# Patient Record
Sex: Female | Born: 1950
Health system: Southern US, Community
[De-identification: ages and names within clinical notes are randomized; demographics above are authoritative.]

## PROBLEM LIST (undated history)

## (undated) DIAGNOSIS — B958 Unspecified staphylococcus as the cause of diseases classified elsewhere: Secondary | ICD-10-CM

## (undated) DIAGNOSIS — H269 Unspecified cataract: Secondary | ICD-10-CM

## (undated) DIAGNOSIS — F419 Anxiety disorder, unspecified: Secondary | ICD-10-CM

## (undated) DIAGNOSIS — K219 Gastro-esophageal reflux disease without esophagitis: Secondary | ICD-10-CM

## (undated) DIAGNOSIS — I10 Essential (primary) hypertension: Secondary | ICD-10-CM

## (undated) DIAGNOSIS — F32A Depression, unspecified: Secondary | ICD-10-CM

## (undated) DIAGNOSIS — IMO0002 Reserved for concepts with insufficient information to code with codable children: Secondary | ICD-10-CM

## (undated) DIAGNOSIS — B1920 Unspecified viral hepatitis C without hepatic coma: Secondary | ICD-10-CM

## (undated) DIAGNOSIS — M25569 Pain in unspecified knee: Secondary | ICD-10-CM

## (undated) DIAGNOSIS — F329 Major depressive disorder, single episode, unspecified: Secondary | ICD-10-CM

## (undated) DIAGNOSIS — Z780 Asymptomatic menopausal state: Secondary | ICD-10-CM

## (undated) DIAGNOSIS — J189 Pneumonia, unspecified organism: Secondary | ICD-10-CM

## (undated) DIAGNOSIS — M1990 Unspecified osteoarthritis, unspecified site: Secondary | ICD-10-CM

## (undated) DIAGNOSIS — E119 Type 2 diabetes mellitus without complications: Secondary | ICD-10-CM

## (undated) DIAGNOSIS — Z8739 Personal history of other diseases of the musculoskeletal system and connective tissue: Secondary | ICD-10-CM

## (undated) DIAGNOSIS — R519 Headache, unspecified: Secondary | ICD-10-CM

## (undated) DIAGNOSIS — R51 Headache: Secondary | ICD-10-CM

## (undated) DIAGNOSIS — G4700 Insomnia, unspecified: Secondary | ICD-10-CM

## (undated) HISTORY — DX: Unspecified cataract: H26.9

## (undated) HISTORY — PX: BREAST BIOPSY: SHX20

## (undated) HISTORY — DX: Insomnia, unspecified: G47.00

## (undated) HISTORY — PX: OVARIAN CYST REMOVAL: SHX89

## (undated) HISTORY — DX: Essential (primary) hypertension: I10

## (undated) HISTORY — DX: Personal history of other diseases of the musculoskeletal system and connective tissue: Z87.39

## (undated) HISTORY — DX: Major depressive disorder, single episode, unspecified: F32.9

## (undated) HISTORY — DX: Unspecified osteoarthritis, unspecified site: M19.90

## (undated) HISTORY — DX: Asymptomatic menopausal state: Z78.0

## (undated) HISTORY — DX: Reserved for concepts with insufficient information to code with codable children: IMO0002

## (undated) HISTORY — DX: Anxiety disorder, unspecified: F41.9

## (undated) HISTORY — DX: Depression, unspecified: F32.A

## (undated) HISTORY — PX: KNEE SURGERY: SHX244

## (undated) HISTORY — DX: Unspecified viral hepatitis C without hepatic coma: B19.20

## (undated) HISTORY — PX: JOINT REPLACEMENT: SHX530

## (undated) HISTORY — DX: Gastro-esophageal reflux disease without esophagitis: K21.9

## (undated) HISTORY — DX: Pain in unspecified knee: M25.569

## (undated) HISTORY — PX: FRACTURE SURGERY: SHX138

---

## 1981-02-22 HISTORY — PX: BUNIONECTOMY: SHX129

## 1985-02-22 HISTORY — PX: TUBAL LIGATION: SHX77

## 1988-10-23 DIAGNOSIS — IMO0002 Reserved for concepts with insufficient information to code with codable children: Secondary | ICD-10-CM

## 1988-10-23 HISTORY — DX: Reserved for concepts with insufficient information to code with codable children: IMO0002

## 1999-12-30 ENCOUNTER — Other Ambulatory Visit: Admission: RE | Admit: 1999-12-30 | Discharge: 1999-12-30 | Payer: Self-pay | Admitting: Obstetrics and Gynecology

## 2000-01-12 ENCOUNTER — Encounter: Admission: RE | Admit: 2000-01-12 | Discharge: 2000-01-12 | Payer: Self-pay | Admitting: General Surgery

## 2000-01-12 ENCOUNTER — Encounter: Payer: Self-pay | Admitting: General Surgery

## 2000-06-29 ENCOUNTER — Encounter: Admission: RE | Admit: 2000-06-29 | Discharge: 2000-06-29 | Payer: Self-pay | Admitting: General Surgery

## 2000-06-29 ENCOUNTER — Encounter: Payer: Self-pay | Admitting: General Surgery

## 2000-12-22 ENCOUNTER — Encounter: Admission: RE | Admit: 2000-12-22 | Discharge: 2000-12-22 | Payer: Self-pay | Admitting: General Surgery

## 2000-12-22 ENCOUNTER — Encounter: Payer: Self-pay | Admitting: General Surgery

## 2001-08-22 ENCOUNTER — Other Ambulatory Visit: Admission: RE | Admit: 2001-08-22 | Discharge: 2001-08-22 | Payer: Self-pay | Admitting: Obstetrics and Gynecology

## 2002-09-11 ENCOUNTER — Other Ambulatory Visit: Admission: RE | Admit: 2002-09-11 | Discharge: 2002-09-11 | Payer: Self-pay | Admitting: *Deleted

## 2003-03-05 ENCOUNTER — Encounter: Admission: RE | Admit: 2003-03-05 | Discharge: 2003-03-05 | Payer: Self-pay | Admitting: *Deleted

## 2007-03-17 ENCOUNTER — Emergency Department (HOSPITAL_COMMUNITY): Admission: EM | Admit: 2007-03-17 | Discharge: 2007-03-17 | Payer: Self-pay | Admitting: Emergency Medicine

## 2008-02-23 DIAGNOSIS — Z780 Asymptomatic menopausal state: Secondary | ICD-10-CM

## 2008-02-23 HISTORY — PX: EYE SURGERY: SHX253

## 2008-02-23 HISTORY — DX: Asymptomatic menopausal state: Z78.0

## 2009-04-15 ENCOUNTER — Encounter: Admission: RE | Admit: 2009-04-15 | Discharge: 2009-04-15 | Payer: Self-pay | Admitting: Internal Medicine

## 2010-06-16 ENCOUNTER — Other Ambulatory Visit: Payer: Self-pay | Admitting: Internal Medicine

## 2010-06-16 DIAGNOSIS — Z1231 Encounter for screening mammogram for malignant neoplasm of breast: Secondary | ICD-10-CM

## 2010-06-24 ENCOUNTER — Ambulatory Visit
Admission: RE | Admit: 2010-06-24 | Discharge: 2010-06-24 | Disposition: A | Discharge status: Home / Self Care | Payer: PRIVATE HEALTH INSURANCE | Source: Ambulatory Visit | Attending: Internal Medicine | Admitting: Internal Medicine

## 2010-06-24 DIAGNOSIS — Z1231 Encounter for screening mammogram for malignant neoplasm of breast: Secondary | ICD-10-CM

## 2010-09-25 ENCOUNTER — Ambulatory Visit (INDEPENDENT_AMBULATORY_CARE_PROVIDER_SITE_OTHER): Payer: PRIVATE HEALTH INSURANCE | Admitting: Ophthalmology

## 2011-04-07 ENCOUNTER — Telehealth: Payer: Self-pay

## 2011-04-07 ENCOUNTER — Ambulatory Visit: Payer: Self-pay | Admitting: Internal Medicine

## 2011-04-07 NOTE — Telephone Encounter (Signed)
NEEDS REFILL ON EVERYTHING. THOUGHT THAT SHE HAD APPT TODAY, BUT DID NOT GET SET UP IN EPIC. DR Merla Riches SAID OK TO REFILL FOR 3 MOS AND WE MADE APPT FOR END OF MARCH REFILL: CELEXA, MELOXICAM, PRILOSEC, NORCO 10/325,HYDROCLORATHICIDE 12.5,XANAX  [facility] [address]  [phone]

## 2011-05-19 ENCOUNTER — Ambulatory Visit (INDEPENDENT_AMBULATORY_CARE_PROVIDER_SITE_OTHER): Payer: BC Managed Care – PPO | Admitting: Internal Medicine

## 2011-05-19 ENCOUNTER — Encounter: Payer: Self-pay | Admitting: Internal Medicine

## 2011-05-19 VITALS — BP 132/77 | HR 73 | Temp 97.0°F | Resp 16 | Ht 61.25 in | Wt 161.8 lb

## 2011-05-19 DIAGNOSIS — G4700 Insomnia, unspecified: Secondary | ICD-10-CM | POA: Insufficient documentation

## 2011-05-19 DIAGNOSIS — G8929 Other chronic pain: Secondary | ICD-10-CM | POA: Insufficient documentation

## 2011-05-19 DIAGNOSIS — I10 Essential (primary) hypertension: Secondary | ICD-10-CM | POA: Insufficient documentation

## 2011-05-19 DIAGNOSIS — E1159 Type 2 diabetes mellitus with other circulatory complications: Secondary | ICD-10-CM | POA: Insufficient documentation

## 2011-05-19 DIAGNOSIS — M1990 Unspecified osteoarthritis, unspecified site: Secondary | ICD-10-CM | POA: Insufficient documentation

## 2011-05-19 DIAGNOSIS — K219 Gastro-esophageal reflux disease without esophagitis: Secondary | ICD-10-CM | POA: Insufficient documentation

## 2011-05-19 DIAGNOSIS — F418 Other specified anxiety disorders: Secondary | ICD-10-CM | POA: Insufficient documentation

## 2011-05-19 DIAGNOSIS — M5010 Cervical disc disorder with radiculopathy, unspecified cervical region: Secondary | ICD-10-CM | POA: Insufficient documentation

## 2011-05-19 DIAGNOSIS — M47812 Spondylosis without myelopathy or radiculopathy, cervical region: Secondary | ICD-10-CM | POA: Insufficient documentation

## 2011-05-19 DIAGNOSIS — B1920 Unspecified viral hepatitis C without hepatic coma: Secondary | ICD-10-CM | POA: Insufficient documentation

## 2011-05-19 DIAGNOSIS — M25569 Pain in unspecified knee: Secondary | ICD-10-CM

## 2011-05-19 DIAGNOSIS — Z8669 Personal history of other diseases of the nervous system and sense organs: Secondary | ICD-10-CM | POA: Insufficient documentation

## 2011-05-19 DIAGNOSIS — G43909 Migraine, unspecified, not intractable, without status migrainosus: Secondary | ICD-10-CM | POA: Insufficient documentation

## 2011-05-19 MED ORDER — HYDROCHLOROTHIAZIDE 12.5 MG PO CAPS: 12.5000 mg | ORAL_CAPSULE | Freq: Every day | ORAL | Status: DC

## 2011-05-19 MED ORDER — OMEPRAZOLE 20 MG PO CPDR: 20.0000 mg | DELAYED_RELEASE_CAPSULE | Freq: Every day | ORAL | Status: DC

## 2011-05-19 MED ORDER — HYDROCODONE-ACETAMINOPHEN 10-325 MG PO TABS: 1.0000 | ORAL_TABLET | Freq: Four times a day (QID) | ORAL | Status: DC | PRN

## 2011-05-19 MED ORDER — ALPRAZOLAM 0.5 MG PO TABS: 0.5000 mg | ORAL_TABLET | Freq: Three times a day (TID) | ORAL | Status: DC | PRN

## 2011-05-19 MED ORDER — SUMATRIPTAN SUCCINATE 100 MG PO TABS: 100.0000 mg | ORAL_TABLET | ORAL | Status: DC | PRN

## 2011-05-19 MED ORDER — CITALOPRAM HYDROBROMIDE 40 MG PO TABS: 40.0000 mg | ORAL_TABLET | Freq: Every day | ORAL | Status: DC

## 2011-05-19 MED ORDER — MELOXICAM 15 MG PO TABS: 15.0000 mg | ORAL_TABLET | Freq: Every day | ORAL | Status: DC

## 2011-05-19 NOTE — Progress Notes (Signed)
  Subjective:    Patient ID: Madison Collins, female    DOB: 24-Aug-1950, 61 y.o.   MRN: [id number]  HPI Patient Active Problem List  Diagnoses  . Hepatitis C  . Hypertension  . History of migraine headaches  . Osteoarthritis  . Insomnia  . Chronic knee pain  . Depression with anxiety  . Cervical disc syndrome  . Gastroesophageal reflux  Here for followup and medication refill Doing well/has a new job/still requires daily narcotics to control musculoskeletal pain in order to remain active/depression is stable/anxiety is stable with occasional alprazolam/still very stressed having to raise2 of her daughter's 4 children Ages 15 and 13 months/her daughter is now out of jail in rehabilitation and has had a new baby/Daughter has been diagnosed as bipolar and is having trouble with medication  New job managing a Therapist, sports in [employer]  Still has great relationship with Moise Boring and is planning his 62th birthday party to include a racing event     Review of SystemsCardiovascular stable GI stable-note hepatitis C with small viral load 2009   Has less than one headache every 2 months GERD stable on meds Objective:   Physical Exam Vital signs stable except mild overweight No thyromegaly or thyroid nodules Decreased range of motion neck secondary to pain Right knee status post surgery x4 after touring the anterior cruciate ligament Left knee osteoarthritis Neurological and psychiatric stable       Assessment & Plan:   Patient Active Problem List  Diagnoses  . Hepatitis C  . Hypertension  . History of migraine headaches  . Osteoarthritis  . Insomnia  . Chronic knee pain  . Depression with anxiety  . Cervical disc syndrome  . Gastroesophageal reflux   Norco increased to 4 times a day when necessary Alprazolam increased To 3 times a day if needed Other meds simply refilled Return in 6 months and we will do lab work followup

## 2011-06-11 ENCOUNTER — Encounter: Payer: Self-pay | Admitting: *Deleted

## 2011-06-17 ENCOUNTER — Other Ambulatory Visit: Payer: Self-pay | Admitting: Internal Medicine

## 2011-08-03 ENCOUNTER — Other Ambulatory Visit: Payer: Self-pay | Admitting: Internal Medicine

## 2011-08-03 DIAGNOSIS — Z1231 Encounter for screening mammogram for malignant neoplasm of breast: Secondary | ICD-10-CM

## 2011-08-19 ENCOUNTER — Ambulatory Visit
Admission: RE | Admit: 2011-08-19 | Discharge: 2011-08-19 | Disposition: A | Discharge status: Home / Self Care | Payer: BC Managed Care – PPO | Source: Ambulatory Visit | Attending: Internal Medicine | Admitting: Internal Medicine

## 2011-08-19 DIAGNOSIS — Z1231 Encounter for screening mammogram for malignant neoplasm of breast: Secondary | ICD-10-CM

## 2011-08-24 ENCOUNTER — Other Ambulatory Visit: Payer: Self-pay | Admitting: Internal Medicine

## 2011-08-24 DIAGNOSIS — R928 Other abnormal and inconclusive findings on diagnostic imaging of breast: Secondary | ICD-10-CM

## 2011-08-30 ENCOUNTER — Ambulatory Visit
Admission: RE | Admit: 2011-08-30 | Discharge: 2011-08-30 | Disposition: A | Discharge status: Home / Self Care | Payer: BC Managed Care – PPO | Source: Ambulatory Visit | Attending: Internal Medicine | Admitting: Internal Medicine

## 2011-08-30 DIAGNOSIS — R928 Other abnormal and inconclusive findings on diagnostic imaging of breast: Secondary | ICD-10-CM

## 2011-10-11 ENCOUNTER — Telehealth: Payer: Self-pay

## 2011-10-11 NOTE — Telephone Encounter (Signed)
Per EPIC, patient received td in 02/2007 at ER visit for dog bite.  [facility] notifying patient of this.

## 2011-10-11 NOTE — Telephone Encounter (Signed)
PT NEEDS TO KNOW WHEN HER LAST T-DAP WAS. SHE WAS CUT LAST NIGHT. INFO NOT IN EPIC OR [facility], PAPER CHART NEEDS TO BE PULLED [phone]

## 2011-10-29 ENCOUNTER — Other Ambulatory Visit: Payer: Self-pay | Admitting: Internal Medicine

## 2011-11-01 ENCOUNTER — Telehealth: Payer: Self-pay

## 2011-11-01 NOTE — Telephone Encounter (Signed)
The patient called to request refill of Norco rx until her appointment with Dr. Merla Riches on 11/17/11 at 10:30am.  The patient stated she ran out of this medicine yesterday.  Please call the patient at [phone].

## 2011-11-01 NOTE — Telephone Encounter (Signed)
On 3/27, Dr. Merla Riches wrote for hydrocodone 1 PO QID prn, #120 RF x 5.  So, that should last until 9/27.  She shouldn't be out yet.  What's up?

## 2011-11-02 NOTE — Telephone Encounter (Signed)
LMOM to CB. 

## 2011-11-02 NOTE — Telephone Encounter (Signed)
Spoke with pt advised message, pt understood

## 2011-11-17 ENCOUNTER — Ambulatory Visit (INDEPENDENT_AMBULATORY_CARE_PROVIDER_SITE_OTHER): Payer: BC Managed Care – PPO | Admitting: Internal Medicine

## 2011-11-17 VITALS — BP 133/75 | HR 69 | Temp 98.0°F | Resp 16 | Ht 61.5 in | Wt 161.0 lb

## 2011-11-17 DIAGNOSIS — M5010 Cervical disc disorder with radiculopathy, unspecified cervical region: Secondary | ICD-10-CM

## 2011-11-17 DIAGNOSIS — B1920 Unspecified viral hepatitis C without hepatic coma: Secondary | ICD-10-CM

## 2011-11-17 DIAGNOSIS — G8929 Other chronic pain: Secondary | ICD-10-CM

## 2011-11-17 DIAGNOSIS — F418 Other specified anxiety disorders: Secondary | ICD-10-CM

## 2011-11-17 DIAGNOSIS — Z23 Encounter for immunization: Secondary | ICD-10-CM

## 2011-11-17 DIAGNOSIS — Z6829 Body mass index (BMI) 29.0-29.9, adult: Secondary | ICD-10-CM

## 2011-11-17 DIAGNOSIS — G4700 Insomnia, unspecified: Secondary | ICD-10-CM

## 2011-11-17 DIAGNOSIS — I10 Essential (primary) hypertension: Secondary | ICD-10-CM

## 2011-11-17 DIAGNOSIS — B182 Chronic viral hepatitis C: Secondary | ICD-10-CM

## 2011-11-17 DIAGNOSIS — M1990 Unspecified osteoarthritis, unspecified site: Secondary | ICD-10-CM

## 2011-11-17 DIAGNOSIS — M25569 Pain in unspecified knee: Secondary | ICD-10-CM

## 2011-11-17 LAB — CBC WITH DIFFERENTIAL/PLATELET
HCT: 38.6 % (ref 36.0–46.0)
Hemoglobin: 13.2 g/dL (ref 12.0–15.0)
Lymphs Abs: 3 10*3/uL (ref 0.7–4.0)
MCH: 29.3 pg (ref 26.0–34.0)
Monocytes Absolute: 0.5 10*3/uL (ref 0.1–1.0)
Monocytes Relative: 7 % (ref 3–12)
Neutro Abs: 4.1 10*3/uL (ref 1.7–7.7)
Neutrophils Relative %: 52 % (ref 43–77)
RBC: 4.51 MIL/uL (ref 3.87–5.11)

## 2011-11-17 LAB — COMPREHENSIVE METABOLIC PANEL
Albumin: 4.3 g/dL (ref 3.5–5.2)
Alkaline Phosphatase: 72 U/L (ref 39–117)
BUN: 16 mg/dL (ref 6–23)
Calcium: 9.4 mg/dL (ref 8.4–10.5)
Creat: 0.61 mg/dL (ref 0.50–1.10)
Glucose, Bld: 103 mg/dL — ABNORMAL HIGH (ref 70–99)
Potassium: 4.3 mEq/L (ref 3.5–5.3)

## 2011-11-17 MED ORDER — ALPRAZOLAM 0.5 MG PO TABS: 0.5000 mg | ORAL_TABLET | Freq: Three times a day (TID) | ORAL | Status: DC | PRN

## 2011-11-17 MED ORDER — HYDROCODONE-ACETAMINOPHEN 10-325 MG PO TABS: 1.0000 | ORAL_TABLET | Freq: Four times a day (QID) | ORAL | Status: DC | PRN

## 2011-11-17 NOTE — Progress Notes (Signed)
Subjective:    Patient ID: Madison Collins, female    DOB: 02-Aug-1950, 61 y.o.   MRN: [id number]  HPI here for f/u Patient Active Problem List  Diagnosis  . Hepatitis C-Remains asymptomatic/last labs 2011 had one liver function test barely elevated  . Hypertension  . History of migraine headaches-Seldom uses Imitrex  . Osteoarthritis-Hands the worst/also low back with picking up kids  . Insomnia-Stable with Xanax at bedtime/past history of bad reactions to Ambien and Restoril  . Chronic knee pain-Right knee with no anterior cruciate ligament and chronic changes/can afford surgery name//Status post 4 surgeries on this may  . Depression with anxiety-Doing very well he states all the great stress from work  . Cervical disc syndrome  . Gastroesophageal reflux    Recent abnormal mammogram followed by normal [place].  Trying to manage 3 grandchildren (7yo, 2yo, and daughter is currently pregnant), taking care of her mom (lung Ca. dx ago),  And unhappy with boss at work.  She does not have full custody of grandchildren which makes rasing them difficult/Her daughter, their mother hooked on drugs in a lot of legal trouble.  She mentions concerns about insurance coverage during the next year before she becomes eligible for disability.   She is having trouble managing her stressors.  Has been able to sleep well using her Xanax, melatonin and reading.   She mentions she would like to have her blood work checked to follow up on her Hepatitis C and blood sugars while here today.   She also mentions that she is interested in following up with her orthopedist in the future regarding her MSK problems (R knee, low back, and C-spine)  however he is now retired.    Social: Works as a Production designer, theatre/television/film at [employer]. Lives at home with mom and 3 young grandchildren.Very good relationship In 2 years will be on ex-husb disability/social security  PMHX: Cataracts - progressing as expected Zostavax  October 2012 Hepatitis B and a completed at [facility] at time of hepatitis C diagnosis Pneumovax September 2008 Gets flu vaccine annually Last tetanus 2009 in the emergency room 2011 within normal limits She uses fecal occult blood testing rather than colonoscopy  Sx: Recent eye surgery? (pt states she needs another cataracts sx)  Review of Systems With runny nose/cough/last week/others at home sick Otherwise stable    Objective:   Physical Exam Vital signs normal Pupils equal round reactive to light and accommodation ENT clear Except mild congestion No thyromegaly Heart regular without murmur Lungs clear       Assessment & Plan:  Problem #1 hepatitis C Problem #2 osteoarthritis with chronic pain neck back and hands and right knee  -Advised pt about services offered by [employer] Re: legal custody, and job opportunities through [employer] or [employer] that might offer insurance coverage for their employees. -Check liver studies Meds ordered this encounter  Medications  . calcium carbonate 200 MG capsule    Sig: Take 250 mg by mouth 2 (two) times daily with a meal.  . ALPRAZolam (XANAX) 0.5 MG tablet    Sig: Take 1 tablet (0.5 mg total) by mouth 3 (three) times daily as needed.    Dispense:  90 tablet    Refill:  5  . HYDROcodone-acetaminophen (NORCO) 10-325 MG per tablet    Sig: Take 1 tablet by mouth every 6 (six) hours as needed.    Dispense:  120 tablet    Refill:  5   Followup 6-12 months/may call for refills in 6 months if stable -CBC w/ diff -Discussed alternative medicine options for managing her joint pain and referred her to [email or web address] --Discussed the possibility of new referral for msk problems in the future. -Refilled Alprazolam -Refilled Norco Hemoccults at home Flu vaccine

## 2011-11-18 ENCOUNTER — Encounter: Payer: Self-pay | Admitting: Internal Medicine

## 2011-11-18 DIAGNOSIS — Z6829 Body mass index (BMI) 29.0-29.9, adult: Secondary | ICD-10-CM | POA: Insufficient documentation

## 2011-12-23 ENCOUNTER — Other Ambulatory Visit: Payer: Self-pay | Admitting: Physician Assistant

## 2012-03-13 ENCOUNTER — Other Ambulatory Visit: Payer: Self-pay | Admitting: Internal Medicine

## 2012-03-13 DIAGNOSIS — N63 Unspecified lump in unspecified breast: Secondary | ICD-10-CM

## 2012-03-23 ENCOUNTER — Ambulatory Visit
Admission: RE | Admit: 2012-03-23 | Discharge: 2012-03-23 | Disposition: A | Discharge status: Home / Self Care | Payer: BC Managed Care – PPO | Source: Ambulatory Visit | Attending: Internal Medicine | Admitting: Internal Medicine

## 2012-03-23 ENCOUNTER — Other Ambulatory Visit: Payer: BC Managed Care – PPO

## 2012-03-23 DIAGNOSIS — N63 Unspecified lump in unspecified breast: Secondary | ICD-10-CM

## 2012-04-25 ENCOUNTER — Other Ambulatory Visit: Payer: Self-pay | Admitting: Physician Assistant

## 2012-05-01 ENCOUNTER — Other Ambulatory Visit: Payer: Self-pay | Admitting: Internal Medicine

## 2012-05-01 NOTE — Telephone Encounter (Signed)
Meds ordered this encounter  Medications  . HYDROcodone-acetaminophen (NORCO) 10-325 MG per tablet    Sig: TAKE 1 TABLET BY MOUTH EVERY 6 HOURS AS NEEDED FOR PAIN    Dispense:  120 tablet    Refill:  5

## 2012-05-01 NOTE — Telephone Encounter (Signed)
Forward to Dr. Doolittle 

## 2012-05-03 ENCOUNTER — Ambulatory Visit: Payer: BC Managed Care – PPO | Admitting: Internal Medicine

## 2012-05-17 ENCOUNTER — Encounter: Payer: Self-pay | Admitting: Internal Medicine

## 2012-05-17 ENCOUNTER — Ambulatory Visit (INDEPENDENT_AMBULATORY_CARE_PROVIDER_SITE_OTHER): Payer: BC Managed Care – PPO | Admitting: Internal Medicine

## 2012-05-17 VITALS — BP 130/68 | HR 62 | Temp 97.8°F | Resp 16 | Ht 61.0 in | Wt 167.4 lb

## 2012-05-17 DIAGNOSIS — M1990 Unspecified osteoarthritis, unspecified site: Secondary | ICD-10-CM

## 2012-05-17 DIAGNOSIS — M5000 Cervical disc disorder with myelopathy, unspecified cervical region: Secondary | ICD-10-CM

## 2012-05-17 DIAGNOSIS — I10 Essential (primary) hypertension: Secondary | ICD-10-CM

## 2012-05-17 DIAGNOSIS — F418 Other specified anxiety disorders: Secondary | ICD-10-CM

## 2012-05-17 DIAGNOSIS — Z6829 Body mass index (BMI) 29.0-29.9, adult: Secondary | ICD-10-CM

## 2012-05-17 DIAGNOSIS — G4700 Insomnia, unspecified: Secondary | ICD-10-CM

## 2012-05-17 DIAGNOSIS — F341 Dysthymic disorder: Secondary | ICD-10-CM

## 2012-05-17 DIAGNOSIS — M25569 Pain in unspecified knee: Secondary | ICD-10-CM

## 2012-05-17 DIAGNOSIS — M5010 Cervical disc disorder with radiculopathy, unspecified cervical region: Secondary | ICD-10-CM

## 2012-05-17 DIAGNOSIS — Z8669 Personal history of other diseases of the nervous system and sense organs: Secondary | ICD-10-CM

## 2012-05-17 DIAGNOSIS — K219 Gastro-esophageal reflux disease without esophagitis: Secondary | ICD-10-CM

## 2012-05-17 DIAGNOSIS — G8929 Other chronic pain: Secondary | ICD-10-CM

## 2012-05-17 DIAGNOSIS — B1920 Unspecified viral hepatitis C without hepatic coma: Secondary | ICD-10-CM

## 2012-05-17 MED ORDER — ONDANSETRON HCL 4 MG PO TABS: 4.0000 mg | ORAL_TABLET | Freq: Three times a day (TID) | ORAL | Status: DC | PRN

## 2012-05-17 MED ORDER — SUMATRIPTAN SUCCINATE 100 MG PO TABS: 100.0000 mg | ORAL_TABLET | ORAL | Status: DC | PRN

## 2012-05-17 MED ORDER — ALPRAZOLAM 0.5 MG PO TABS: 0.5000 mg | ORAL_TABLET | Freq: Three times a day (TID) | ORAL | Status: DC | PRN

## 2012-05-17 MED ORDER — HYDROCODONE-ACETAMINOPHEN 10-325 MG PO TABS: ORAL_TABLET | ORAL | Status: DC

## 2012-05-17 MED ORDER — MELOXICAM 15 MG PO TABS: 15.0000 mg | ORAL_TABLET | Freq: Every day | ORAL | Status: DC

## 2012-05-17 NOTE — Progress Notes (Signed)
  Subjective:    Patient ID: Madison Collins, female    DOB: 1950/03/15, 62 y.o.   MRN: [id number]  HPI  Patient Active Problem List  Diagnosis  . Hepatitis C--- asymptomatic /liver function studies 3 months ago within normal limits   . Hypertension--- home blood pressure stable   . History of migraine headaches--frequent headaches but not classic migraines has in the past /has tried tryptophans with no results except when headaches are like her previous migraines /lots of stress associated headaches at this point   . Osteoarthritis--without meloxicam she hurts in several joints enough to interfere with activity   . Insomnia--response to Xanax   . Chronic knee pain-when necessary hydrocodone helpful   . Depression with anxiety---4+ stress-daughter and Mom both at home and in arguments const//also work stress//is trying to use behavioral techniques to improve her anxiety as well this has plans to start exercise program //continues on Celexa   . Cervical disc syndrome-when necessary hydrocodone helpful   . Gastroesophageal reflux-stable on omeprazole   . BMI 29.0-29.9,adult  Work is extremely stressful/hours and pay reduced//looking for new job Good relationship-Donnie Harris    Review of Systems Viral gastroenteritis this week now resolving No other particular symptoms No weight loss or night sweats    Objective:   Physical Exam BP 130/68  Pulse 62  Temp(Src) 97.8 F (36.6 C) (Oral)  Resp 16  Ht 5\' 1"  (1.549 m)  Wt 167 lb 6.4 oz (75.932 kg)  BMI 31.65 kg/m2  SpO2 98% HEENT clear Heart regular Neurological intact Mood and affect appropriate/good       Assessment & Plan:   Patient Active Problem List  Diagnosis  . Hepatitis C  . Hypertension  . History of migraine headaches  . Osteoarthritis  . Insomnia  . Chronic knee pain  . Depression with anxiety  . Cervical disc syndrome  . Gastroesophageal reflux  . BMI 29.0-29.9,adult    -  borderline cholesterol-needs  weight loss to avoid medications  Meds ordered this encounter  Medications  . ALPRAZolam (XANAX) 0.5 MG tablet    Sig: Take 1 tablet (0.5 mg total) by mouth 3 (three) times daily as needed.    Dispense:  90 tablet    Refill:  5  . HYDROcodone-acetaminophen (NORCO) 10-325 MG per tablet    Sig: TAKE 1 TABLET BY MOUTH EVERY 6 HOURS AS NEEDED FOR PAIN    Dispense:  120 tablet    Refill:  5  . meloxicam (MOBIC) 15 MG tablet    Sig: Take 1 tablet (15 mg total) by mouth daily.    Dispense:  90 tablet    Refill:  3  . SUMAtriptan (IMITREX) 100 MG tablet    Sig: Take 1 tablet (100 mg total) by mouth every 2 (two) hours as needed.    Dispense:  10 tablet    Refill:  5  . ondansetron (ZOFRAN) 4 MG tablet    Sig: Take 1 tablet (4 mg total) by mouth every 8 (eight) hours as needed for nausea.    Dispense:  12 tablet    Refill:  0   Other medication refills are good through September when she will followup for lab and exams

## 2012-05-25 ENCOUNTER — Other Ambulatory Visit: Payer: Self-pay | Admitting: Internal Medicine

## 2012-11-07 ENCOUNTER — Other Ambulatory Visit: Payer: Self-pay

## 2012-11-07 DIAGNOSIS — Z1231 Encounter for screening mammogram for malignant neoplasm of breast: Secondary | ICD-10-CM

## 2012-11-08 ENCOUNTER — Ambulatory Visit (INDEPENDENT_AMBULATORY_CARE_PROVIDER_SITE_OTHER): Payer: BC Managed Care – PPO | Admitting: Internal Medicine

## 2012-11-08 VITALS — BP 136/74 | HR 74 | Temp 98.2°F | Resp 16 | Ht 62.0 in | Wt 170.0 lb

## 2012-11-08 DIAGNOSIS — M1990 Unspecified osteoarthritis, unspecified site: Secondary | ICD-10-CM

## 2012-11-08 DIAGNOSIS — F418 Other specified anxiety disorders: Secondary | ICD-10-CM

## 2012-11-08 DIAGNOSIS — L309 Dermatitis, unspecified: Secondary | ICD-10-CM | POA: Insufficient documentation

## 2012-11-08 DIAGNOSIS — B1920 Unspecified viral hepatitis C without hepatic coma: Secondary | ICD-10-CM

## 2012-11-08 DIAGNOSIS — M25579 Pain in unspecified ankle and joints of unspecified foot: Secondary | ICD-10-CM

## 2012-11-08 DIAGNOSIS — K219 Gastro-esophageal reflux disease without esophagitis: Secondary | ICD-10-CM

## 2012-11-08 DIAGNOSIS — M25571 Pain in right ankle and joints of right foot: Secondary | ICD-10-CM

## 2012-11-08 DIAGNOSIS — Z23 Encounter for immunization: Secondary | ICD-10-CM

## 2012-11-08 DIAGNOSIS — I10 Essential (primary) hypertension: Secondary | ICD-10-CM

## 2012-11-08 DIAGNOSIS — M5010 Cervical disc disorder with radiculopathy, unspecified cervical region: Secondary | ICD-10-CM

## 2012-11-08 DIAGNOSIS — G8929 Other chronic pain: Secondary | ICD-10-CM

## 2012-11-08 DIAGNOSIS — F341 Dysthymic disorder: Secondary | ICD-10-CM

## 2012-11-08 DIAGNOSIS — Z8669 Personal history of other diseases of the nervous system and sense organs: Secondary | ICD-10-CM

## 2012-11-08 DIAGNOSIS — Z6829 Body mass index (BMI) 29.0-29.9, adult: Secondary | ICD-10-CM

## 2012-11-08 LAB — CBC
HCT: 39.7 % (ref 36.0–46.0)
MCV: 85.7 fL (ref 78.0–100.0)
RBC: 4.63 MIL/uL (ref 3.87–5.11)
WBC: 8 10*3/uL (ref 4.0–10.5)

## 2012-11-08 MED ORDER — FLUOCINONIDE-E 0.05 % EX CREA: TOPICAL_CREAM | Freq: Two times a day (BID) | CUTANEOUS | Status: DC

## 2012-11-08 MED ORDER — ALPRAZOLAM 0.5 MG PO TABS: 0.5000 mg | ORAL_TABLET | Freq: Three times a day (TID) | ORAL | Status: DC | PRN

## 2012-11-08 MED ORDER — HYDROCHLOROTHIAZIDE 12.5 MG PO CAPS: ORAL_CAPSULE | ORAL | Status: DC

## 2012-11-08 MED ORDER — CITALOPRAM HYDROBROMIDE 40 MG PO TABS: ORAL_TABLET | ORAL | Status: DC

## 2012-11-08 MED ORDER — HYDROCODONE-ACETAMINOPHEN 10-325 MG PO TABS: ORAL_TABLET | ORAL | Status: DC

## 2012-11-08 MED ORDER — OMEPRAZOLE 20 MG PO CPDR: DELAYED_RELEASE_CAPSULE | ORAL | Status: DC

## 2012-11-08 NOTE — Progress Notes (Signed)
Subjective:    Patient ID: Madison Collins, female    DOB: 1950/08/24, 62 y.o.   MRN: [id number]  HPI here for f/u Patient Active Problem List   Diagnosis Date Noted  . Foot dermatitis 11/08/2012  . BMI 29.0-29.9,adult 11/18/2011  . Hepatitis C 05/19/2011  . Hypertension 05/19/2011  . History of migraine headaches 05/19/2011  . Osteoarthritis 05/19/2011  . Insomnia 05/19/2011  . Chronic knee pain 05/19/2011  . Depression with anxiety 05/19/2011  . Cervical disc syndrome 05/19/2011  . Gastroesophageal reflux 05/19/2011     Right ankle pain for 2 weeks, no injury. Around top of ankle. Thinks this could be arthritis since she has many other joints affected.  Mother passed away in 08-25-2022. She was taking care of her and this was somewhat unexpected. She is doing a little better with this and is starting to take care of herself better and eat better. She still takes care of her 3 grandchildren which keeps her busy. Stress level different since her mother died. Daughter at least has a job at this point and there is a plan for getting her into an independent living situation. Has to take 1-4 pain pills a day depending on how severe pain is.  Celexa working ok for managing depression/anxiety.  Right foot and ankle with dry, red rash. Heels cracked.  Migraines about 1x month.  Review of Systems  Constitutional: Negative for activity change, appetite change, fatigue and unexpected weight change.  Respiratory: Negative for cough, chest tightness and shortness of breath.   Cardiovascular: Negative for chest pain.  Gastrointestinal: Negative for abdominal pain and abdominal distention.   Scheduled follow up mammo which is overdue. Is considering colonoscopy once her mother's estate is settled. Had flu shot today Does not remember when last pap was done    Objective:   Physical Exam  Constitutional: She is oriented to person, place, and time. She appears well-developed and  well-nourished.  Neck:  Painful rom   Cardiovascular: Normal rate, regular rhythm and normal heart sounds.   Pulmonary/Chest: Effort normal and breath sounds normal.  Musculoskeletal: Normal range of motion.  Pain with rotation. DP/PT pulses present. Color normal. ROM normal.  Neurological: She is alert and oriented to person, place, and time.  Skin: Skin is warm and dry.  Psychiatric: She has a normal mood and affect. Her behavior is normal. Judgment and thought content normal.  Right foot with erythema and scattered macules on heel and top of foot. Area between toes clear.   and ankle with good range of motion without laxity Mild tenderness dorsally at the syndesmosis  Assessment & Plan:  Need for prophylactic vaccination and inoculation against influenza - Plan: Flu Vaccine QUAD 36+ mos IM  Osteoarthritis/chronic pain knee and neck  Hypertension  Depression with anxiety--stable/doing well  Pain in joint, ankle and foot, right--- unclear etiology recheck in 4 weeks if not well  1- Flu shot today 2- Osteoarthritis- continue current meds 3-depression with anxiety- continue Celexa 4- pain in right ankle- elevate, heat/ice for comfort. Wear supportive shoe. F/U with xray if no improvement. 5-Dermatitis of foot 6- 6 month follow up with CPE   7- colonoscopy pending  Meds ordered this encounter  Medications  . fluocinonide-emollient (LIDEX-E) 0.05 % cream    Sig: Apply topically 2 (two) times daily. Foot dermatitis    Dispense:  30 g    Refill:  3  . hydrochlorothiazide (MICROZIDE) 12.5 MG capsule    Sig: TAKE 1 CAPSULE BY  MOUTH DAILY    Dispense:  90 capsule    Refill:  1  . citalopram (CELEXA) 40 MG tablet    Sig: TAKE 1 TABLET BY MOUTH DAILY    Dispense:  90 tablet    Refill:  1  . omeprazole (PRILOSEC) 20 MG capsule    Sig: TAKE 1 CAPSULE BY MOUTH DAILY    Dispense:  90 capsule    Refill:  1  . HYDROcodone-acetaminophen (NORCO) 10-325 MG per tablet    Sig: TAKE 1  TABLET BY MOUTH EVERY 6 HOURS AS NEEDED FOR PAIN    Dispense:  120 tablet    Refill:  5  . ALPRAZolam (XANAX) 0.5 MG tablet    Sig: Take 1 tablet (0.5 mg total) by mouth 3 (three) times daily as needed.    Dispense:  90 tablet    Refill:  5

## 2012-11-09 ENCOUNTER — Encounter: Payer: Self-pay | Admitting: Internal Medicine

## 2012-11-09 LAB — COMPREHENSIVE METABOLIC PANEL
BUN: 16 mg/dL (ref 6–23)
CO2: 24 mEq/L (ref 19–32)
Calcium: 9.2 mg/dL (ref 8.4–10.5)
Chloride: 102 mEq/L (ref 96–112)
Creat: 0.61 mg/dL (ref 0.50–1.10)
Glucose, Bld: 87 mg/dL (ref 70–99)

## 2012-11-28 ENCOUNTER — Other Ambulatory Visit: Payer: Self-pay | Admitting: Internal Medicine

## 2012-11-29 ENCOUNTER — Ambulatory Visit: Payer: BC Managed Care – PPO

## 2012-12-11 ENCOUNTER — Telehealth: Payer: Self-pay

## 2012-12-11 DIAGNOSIS — M25571 Pain in right ankle and joints of right foot: Secondary | ICD-10-CM

## 2012-12-11 MED ORDER — HYDROCODONE-ACETAMINOPHEN 10-325 MG PO TABS: ORAL_TABLET | ORAL | Status: DC

## 2012-12-11 NOTE — Telephone Encounter (Signed)
Pt came into 102 to check on Rx. I am printing off Rx to have Dr Merla Riches sign since he is in the office and had written RFs for pt.  Dr Merla Riches signed Rx and it was given to pt.

## 2012-12-11 NOTE — Telephone Encounter (Signed)
Patient would like a refill on her norco 10-325 please call patient with questions at [phone]

## 2012-12-11 NOTE — Telephone Encounter (Signed)
Patient call again.  She is travelling and hits dead zones on her phone.  Please try again.  [phone]

## 2012-12-11 NOTE — Telephone Encounter (Addendum)
Patient had refills, but they have been cancelled with new classification of this medication, pended please print sign and I will call pt.

## 2012-12-20 ENCOUNTER — Ambulatory Visit
Admission: RE | Admit: 2012-12-20 | Discharge: 2012-12-20 | Disposition: A | Discharge status: Home / Self Care | Payer: BC Managed Care – PPO | Source: Ambulatory Visit

## 2012-12-20 DIAGNOSIS — Z1231 Encounter for screening mammogram for malignant neoplasm of breast: Secondary | ICD-10-CM

## 2013-01-06 ENCOUNTER — Telehealth: Payer: Self-pay | Admitting: Internal Medicine

## 2013-01-06 DIAGNOSIS — M25571 Pain in right ankle and joints of right foot: Secondary | ICD-10-CM

## 2013-01-06 NOTE — Telephone Encounter (Signed)
Pt called in requesting a refill of her HYDROcodone-acetaminophen (NORCO) 10-325 MG per tablet. Please call when ready for pick up [phone]

## 2013-01-08 MED ORDER — HYDROCODONE-ACETAMINOPHEN 10-325 MG PO TABS: ORAL_TABLET | ORAL | Status: DC

## 2013-01-08 NOTE — Telephone Encounter (Signed)
Meds ordered this encounter  Medications  . HYDROcodone-acetaminophen (NORCO) 10-325 MG per tablet    Sig: TAKE 1 TABLET BY MOUTH EVERY 6 HOURS AS NEEDED FOR PAIN    Dispense:  120 tablet    Refill:  0

## 2013-01-08 NOTE — Telephone Encounter (Signed)
Patient advised this is ready for pick up.  

## 2013-01-15 ENCOUNTER — Emergency Department (HOSPITAL_COMMUNITY)
Admission: EM | Admit: 2013-01-15 | Discharge: 2013-01-15 | Disposition: A | Discharge status: Home / Self Care | Payer: BC Managed Care – PPO | Attending: Emergency Medicine | Admitting: Emergency Medicine

## 2013-01-15 ENCOUNTER — Encounter (HOSPITAL_COMMUNITY): Payer: Self-pay | Admitting: Emergency Medicine

## 2013-01-15 ENCOUNTER — Ambulatory Visit (INDEPENDENT_AMBULATORY_CARE_PROVIDER_SITE_OTHER): Payer: BC Managed Care – PPO | Admitting: Family Medicine

## 2013-01-15 ENCOUNTER — Emergency Department (HOSPITAL_COMMUNITY): Payer: BC Managed Care – PPO

## 2013-01-15 VITALS — BP 135/78 | HR 82 | Temp 97.6°F | Resp 18 | Wt 111.0 lb

## 2013-01-15 DIAGNOSIS — R509 Fever, unspecified: Secondary | ICD-10-CM | POA: Insufficient documentation

## 2013-01-15 DIAGNOSIS — I10 Essential (primary) hypertension: Secondary | ICD-10-CM | POA: Insufficient documentation

## 2013-01-15 DIAGNOSIS — R8281 Pyuria: Secondary | ICD-10-CM

## 2013-01-15 DIAGNOSIS — Z8619 Personal history of other infectious and parasitic diseases: Secondary | ICD-10-CM | POA: Insufficient documentation

## 2013-01-15 DIAGNOSIS — R112 Nausea with vomiting, unspecified: Secondary | ICD-10-CM | POA: Insufficient documentation

## 2013-01-15 DIAGNOSIS — N390 Urinary tract infection, site not specified: Secondary | ICD-10-CM

## 2013-01-15 DIAGNOSIS — F341 Dysthymic disorder: Secondary | ICD-10-CM | POA: Insufficient documentation

## 2013-01-15 DIAGNOSIS — G4700 Insomnia, unspecified: Secondary | ICD-10-CM | POA: Insufficient documentation

## 2013-01-15 DIAGNOSIS — R82998 Other abnormal findings in urine: Secondary | ICD-10-CM

## 2013-01-15 DIAGNOSIS — M1990 Unspecified osteoarthritis, unspecified site: Secondary | ICD-10-CM | POA: Insufficient documentation

## 2013-01-15 DIAGNOSIS — N61 Mastitis without abscess: Secondary | ICD-10-CM | POA: Insufficient documentation

## 2013-01-15 DIAGNOSIS — Z79899 Other long term (current) drug therapy: Secondary | ICD-10-CM | POA: Insufficient documentation

## 2013-01-15 DIAGNOSIS — L02219 Cutaneous abscess of trunk, unspecified: Secondary | ICD-10-CM

## 2013-01-15 DIAGNOSIS — E860 Dehydration: Secondary | ICD-10-CM

## 2013-01-15 LAB — COMPREHENSIVE METABOLIC PANEL
ALT: 20 U/L (ref 0–35)
AST: 14 U/L (ref 0–37)
Albumin: 4.1 g/dL (ref 3.5–5.2)
Alkaline Phosphatase: 69 U/L (ref 39–117)
BUN: 13 mg/dL (ref 6–23)
CO2: 25 mEq/L (ref 19–32)
Calcium: 9.3 mg/dL (ref 8.4–10.5)
Chloride: 104 mEq/L (ref 96–112)
Creat: 0.58 mg/dL (ref 0.50–1.10)
Glucose, Bld: 100 mg/dL — ABNORMAL HIGH (ref 70–99)
Potassium: 3.8 mEq/L (ref 3.5–5.3)
Sodium: 141 mEq/L (ref 135–145)
Total Bilirubin: 0.5 mg/dL (ref 0.3–1.2)
Total Protein: 7.4 g/dL (ref 6.0–8.3)

## 2013-01-15 LAB — POCT CBC
Granulocyte percent: 70.9 %G (ref 37–80)
HCT, POC: 41.8 % (ref 37.7–47.9)
Hemoglobin: 13.1 g/dL (ref 12.2–16.2)
Lymph, poc: 2.5 (ref 0.6–3.4)
MCH, POC: 29.2 pg (ref 27–31.2)
MCHC: 31.3 g/dL — AB (ref 31.8–35.4)
MCV: 93.2 fL (ref 80–97)
MID (cbc): 0.6 (ref 0–0.9)
MPV: 9.3 fL (ref 0–99.8)
POC Granulocyte: 7.6 — AB (ref 2–6.9)
POC LYMPH PERCENT: 23.5 %L (ref 10–50)
POC MID %: 5.6 %M (ref 0–12)
Platelet Count, POC: 240 10*3/uL (ref 142–424)
RBC: 4.49 M/uL (ref 4.04–5.48)
RDW, POC: 14.4 %
WBC: 10.7 10*3/uL — AB (ref 4.6–10.2)

## 2013-01-15 LAB — POCT URINALYSIS DIPSTICK
Bilirubin, UA: NEGATIVE
Glucose, UA: NEGATIVE
Ketones, UA: NEGATIVE
Nitrite, UA: NEGATIVE
Protein, UA: NEGATIVE
Spec Grav, UA: 1.01
Urobilinogen, UA: 0.2
pH, UA: 7.5

## 2013-01-15 LAB — BASIC METABOLIC PANEL
BUN: 12 mg/dL (ref 6–23)
CO2: 21 mEq/L (ref 19–32)
Calcium: 9 mg/dL (ref 8.4–10.5)
Creatinine, Ser: 0.53 mg/dL (ref 0.50–1.10)
GFR calc Af Amer: >90 mL/min (ref >90)
GFR calc non Af Amer: >90 mL/min (ref >90)
Sodium: 140 mEq/L (ref 135–145)

## 2013-01-15 LAB — POCT UA - MICROSCOPIC ONLY
Bacteria, U Microscopic: NEGATIVE
Casts, Ur, LPF, POC: NEGATIVE
Crystals, Ur, HPF, POC: NEGATIVE
Mucus, UA: NEGATIVE
Yeast, UA: NEGATIVE

## 2013-01-15 MED ORDER — VANCOMYCIN HCL IN DEXTROSE 1-5 GM/200ML-% IV SOLN
1000.0000 mg | Freq: Once | INTRAVENOUS | Status: AC
  Administered 2013-01-15: 1000 mg via INTRAVENOUS
  Filled 2013-01-15: qty 200

## 2013-01-15 MED ORDER — SODIUM CHLORIDE 0.9 % IV BOLUS (SEPSIS)
1000.0000 mL | Freq: Once | INTRAVENOUS | Status: AC
  Administered 2013-01-15: 1000 mL via INTRAVENOUS

## 2013-01-15 MED ORDER — ONDANSETRON HCL 4 MG/2ML IJ SOLN
4.0000 mg | Freq: Once | INTRAMUSCULAR | Status: AC
  Administered 2013-01-15: 4 mg via INTRAVENOUS
  Filled 2013-01-15: qty 2

## 2013-01-15 MED ORDER — OXYCODONE-ACETAMINOPHEN 5-325 MG PO TABS: 1.0000 | ORAL_TABLET | ORAL | Status: DC | PRN

## 2013-01-15 MED ORDER — ONDANSETRON HCL 4 MG PO TABS: 4.0000 mg | ORAL_TABLET | Freq: Four times a day (QID) | ORAL | Status: DC

## 2013-01-15 MED ORDER — SULFAMETHOXAZOLE-TMP DS 800-160 MG PO TABS: 1.0000 | ORAL_TABLET | Freq: Two times a day (BID) | ORAL | Status: DC

## 2013-01-15 MED ORDER — AMOXICILLIN-POT CLAVULANATE 875-125 MG PO TABS: 1.0000 | ORAL_TABLET | Freq: Two times a day (BID) | ORAL | Status: DC

## 2013-01-15 MED ORDER — DEXTROSE 5 % IV SOLN
1.0000 g | Freq: Once | INTRAVENOUS | Status: AC
  Administered 2013-01-15: 1 g via INTRAVENOUS
  Filled 2013-01-15: qty 10

## 2013-01-15 MED ORDER — MORPHINE SULFATE 4 MG/ML IJ SOLN
4.0000 mg | Freq: Once | INTRAMUSCULAR | Status: AC
  Administered 2013-01-15: 4 mg via INTRAVENOUS
  Filled 2013-01-15: qty 1

## 2013-01-15 MED ORDER — CIPROFLOXACIN HCL 500 MG PO TABS: 500.0000 mg | ORAL_TABLET | Freq: Two times a day (BID) | ORAL | Status: DC

## 2013-01-15 NOTE — ED Provider Notes (Signed)
TIME SEEN: 2:57 PM  CHIEF COMPLAINT: Right breast pain, nausea and vomiting, fever  HPI: Patient is a 62- year-old female with a history of hepatitis C, hypertension who presents to the emergency department with complaints of fevers, chills, nausea and vomiting have been present for the past 5 days and then right breast pain and redness that she has noticed since Sunday, one day ago.  Patient reports that she has been able to drink a small amount today and yesterday. She denies any diarrhea or abdominal pain. No cough or shortness of breath. She did recently have a mammogram on 12/20/12 but denies any other trauma to the past, procedures. No prior history of breast abscess or cancer.  No nipple drainage.    ROS: See HPI Constitutional:  fever  Eyes: no drainage  ENT: no runny nose   Cardiovascular:  no chest pain  Resp: no SOB  GI: no vomiting GU: no dysuria Integumentary: no rash  Allergy: no hives  Musculoskeletal: no leg swelling  Neurological: no slurred speech ROS otherwise negative  PAST MEDICAL HISTORY/PAST SURGICAL HISTORY:  Past Medical History  Diagnosis Date  . Hepatitis C     IA viral load low 02/09    BX past mild fibrosis  . Hypertension   . Insomnia, unspecified   . Knee pain   . Anxiety and depression   . Bulging disc 1990's    c4-5  . Post-menopause 2010  . GERD (gastroesophageal reflux disease)   . H/O: osteoarthritis   . Arthritis   . Depression   . Cataract     right eye  . Anxiety     MEDICATIONS:  Prior to Admission medications   Medication Sig Start Date End Date Taking? Authorizing Provider  ALPRAZolam Prudy Feeler) 0.5 MG tablet Take 0.5 mg by mouth 3 (three) times daily as needed for anxiety.   Yes Historical Provider, MD  citalopram (CELEXA) 40 MG tablet Take 40 mg by mouth every morning.   Yes Historical Provider, MD  fluocinonide-emollient (LIDEX-E) 0.05 % cream Apply topically 2 (two) times daily. Foot dermatitis 11/08/12  Yes Tonye Pearson,  MD  hydrochlorothiazide (MICROZIDE) 12.5 MG capsule Take 12.5 mg by mouth every morning.   Yes Historical Provider, MD  HYDROcodone-acetaminophen (NORCO) 10-325 MG per tablet Take 1 tablet by mouth every 6 (six) hours as needed (For pain.).   Yes Historical Provider, MD  ibuprofen (ADVIL,MOTRIN) 200 MG tablet Take 400 mg by mouth every 6 (six) hours as needed (For pain.).   Yes Historical Provider, MD  loratadine (CLARITIN) 10 MG tablet Take 10 mg by mouth daily as needed for allergies.    Yes Historical Provider, MD  meloxicam (MOBIC) 15 MG tablet Take 15 mg by mouth every morning.   Yes Historical Provider, MD  Multiple Vitamins-Minerals (MULTI-VITAMIN GUMMIES) CHEW Chew 1 tablet by mouth every morning.    Yes Historical Provider, MD  omeprazole (PRILOSEC) 20 MG capsule Take 20 mg by mouth every morning.   Yes Historical Provider, MD  ondansetron (ZOFRAN) 4 MG tablet Take 1 tablet (4 mg total) by mouth every 8 (eight) hours as needed for nausea. 05/17/12  Yes Tonye Pearson, MD  SUMAtriptan (IMITREX) 100 MG tablet Take 100 mg by mouth every 2 (two) hours as needed for migraine or headache. May repeat in 2 hours if headache persists or recurs.   Yes Historical Provider, MD    ALLERGIES:  Allergies  Allergen Reactions  . Latex Other (See Comments)    "  Latex tape used during surgery - breaks my skin out"  . Adhesive [Tape] Rash    "per pt - surgical tape, also causes itching    SOCIAL HISTORY:  History  Substance Use Topics  . Smoking status: Never Smoker   . Smokeless tobacco: Not on file  . Alcohol Use: Yes     Comment: a glass of wine/month    FAMILY HISTORY: Family History  Problem Relation Age of Onset  . Cancer Mother     Lung  . Diabetes Mother   . Stroke Father   . Diabetes Father   . Hypertension Brother   . Bipolar disorder Daughter   . Bipolar disorder Son   . Diabetes Maternal Grandfather   . Heart disease Maternal Grandfather   . Emphysema Paternal  Grandmother   . Cancer Paternal Grandfather     stomach    EXAM: BP 148/50  Pulse 87  Temp(Src) 97.6 F (36.4 C) (Oral)  Resp 20  SpO2 100% CONSTITUTIONAL: Alert and oriented and responds appropriately to questions. Well-appearing; well-nourished HEAD: Normocephalic EYES: Conjunctivae clear, PERRL ENT: normal nose; no rhinorrhea; moist mucous membranes; pharynx without lesions noted NECK: Supple, no meningismus, no LAD  CARD: RRR; S1 and S2 appreciated; no murmurs, no clicks, no rubs, no gallops RESP: Normal chest excursion without splinting or tachypnea; breath sounds clear and equal bilaterally; no wheezes, no rhonchi, no rales BREAST:  Patient is a 10 x 8 cm area of induration, erythema and warmth to the right breast; fibrous breast tissue appreciated; unable to determine if associated abscess given patient's significant pain, no nipple discharge or inversion, patient has right-sided axillary lymphadenopathy; left breast appears normal and non-tender to palpation ABD/GI: Normal bowel sounds; non-distended; soft, non-tender, no rebound, no guarding BACK:  The back appears normal and is non-tender to palpation, there is no CVA tenderness EXT: Normal ROM in all joints; non-tender to palpation; no edema; normal capillary refill; no cyanosis    SKIN: Normal color for age and race; warm NEURO: Moves all extremities equally PSYCH: The patient's mood and manner are appropriate. Grooming and personal hygiene are appropriate.  MEDICAL DECISION MAKING: Patient's here from urgent care for evaluation for right breast cellulitis. She was also noted to have a urinary tract infection. Labs show mild leukocytosis of 10.7. She is hemodynamically stable. Will give IV antibiotics, IV fluids, pain in nausea medication and obtain right breast ultrasound. Patient may need admission for symptom control antibiotics given she has had difficulty tolerating by mouth.  ED PROGRESS: Ultrasound negative for  abscess. There is a small fluid collection that radiology favors to be a sebaceous cyst.  Given patient's vitals have been stable and she's been able to tolerate by mouth in ED, we'll discharge home on Bactrim for her UTI and a breast cellulitis. Have instructed patient to followup with her primary care physician as recommended repeat ultrasound in one week. Given strict return precautions. Patient verbalizes understanding and is comfortable with plan.     Layla Maw Alysson Geist, DO 01/15/13 1736

## 2013-01-15 NOTE — ED Notes (Signed)
Pt from [facility] c/o urinary tract infection and right breast infection. She has endorsed nausea and vomiting for three days. Pt has history of Hep. C.

## 2013-01-15 NOTE — ED Notes (Signed)
Bed: ZO10 Expected date:  Expected time:  Means of arrival:  Comments: N/v-?septic

## 2013-01-15 NOTE — Progress Notes (Addendum)
Subjective:    Patient ID: Madison Collins, female    DOB: 1950-05-13, 62 y.o.   MRN: [id number] This chart was scribed for Elvina Sidle, MD by Valera Castle, ED Scribe. This patient was seen in room 3and the patient's care was started at 11:51 AM.  HPI Madison Collins is a 62 y.o. female who presents to the [facility] complaining of sudden, moderate fever and emesis, onset 6 days ago. She reports associated hallucinations, dizziness, and body aches. She also reports pain under her right shoulder blade, but states she is constantly picking up her 3 grandchildren, so she is unsure what the cause is. She states she went to bed early 4 days  ago, stating she woke up that night with 3 episodes of emesis, and states she did not get out of her bed the next few days. She reports being able to eat and drink normally, but is afraid to eat, because she might throw it up.   She states the thing that worries her the most is her sudden, moderate, right breast pain, with associated redness, onset 3 days ago. She reports just having a mammogram 12/20/2012.  She denies abdominal pain, urinary symptoms, and any other associated symptoms.   Patient's mother died just a few weeks ago. She still grieving over this period  Patient Active Problem List   Diagnosis Date Noted   Foot dermatitis 11/08/2012   BMI 29.0-29.9,adult 11/18/2011   Hepatitis C 05/19/2011   Hypertension 05/19/2011   History of migraine headaches 05/19/2011   Osteoarthritis 05/19/2011   Insomnia 05/19/2011   Chronic knee pain 05/19/2011   Depression with anxiety 05/19/2011   Cervical disc syndrome 05/19/2011   Gastroesophageal reflux 05/19/2011   Past Medical History  Diagnosis Date   Hepatitis C     IA viral load low 02/09    BX past mild fibrosis   Hypertension    Insomnia, unspecified    Knee pain    Anxiety and depression    Bulging disc 1990's    c4-5   Post-menopause 2010   GERD (gastroesophageal  reflux disease)    H/O: osteoarthritis    Arthritis    Depression    Cataract     right eye   Anxiety    Past Surgical History  Procedure Laterality Date   Bilateral tubal ligation     Knee surgery      x4 right knee   Eye surgery     Tubal ligation     Allergies  Allergen Reactions   Adhesive [Tape] Rash    "per pt - surgical tape, also causes itching   Prior to Admission medications   Medication Sig Start Date End Date Taking? Authorizing Provider  ALPRAZolam Prudy Feeler) 0.5 MG tablet Take 1 tablet (0.5 mg total) by mouth 3 (three) times daily as needed. 11/08/12  Yes Tonye Pearson, MD  citalopram (CELEXA) 40 MG tablet TAKE 1 TABLET BY MOUTH DAILY 11/08/12  Yes Tonye Pearson, MD  fluocinonide-emollient (LIDEX-E) 0.05 % cream Apply topically 2 (two) times daily. Foot dermatitis 11/08/12  Yes Tonye Pearson, MD  hydrochlorothiazide (MICROZIDE) 12.5 MG capsule TAKE 1 CAPSULE BY MOUTH DAILY 11/08/12  Yes Tonye Pearson, MD  HYDROcodone-acetaminophen (NORCO) 10-325 MG per tablet TAKE 1 TABLET BY MOUTH EVERY 6 HOURS AS NEEDED FOR PAIN 01/08/13  Yes Tonye Pearson, MD  loratadine (CLARITIN) 10 MG tablet Take 10 mg by mouth daily.   Yes Historical Provider, MD  meloxicam (  MOBIC) 15 MG tablet Take 1 tablet (15 mg total) by mouth daily. 05/17/12  Yes Tonye Pearson, MD  Multiple Vitamins-Minerals (MULTI-VITAMIN GUMMIES) CHEW Chew by mouth daily.   Yes Historical Provider, MD  omeprazole (PRILOSEC) 20 MG capsule TAKE 1 CAPSULE BY MOUTH DAILY 11/08/12  Yes Tonye Pearson, MD  ondansetron (ZOFRAN) 4 MG tablet Take 1 tablet (4 mg total) by mouth every 8 (eight) hours as needed for nausea. 05/17/12  Yes Tonye Pearson, MD  SUMAtriptan (IMITREX) 100 MG tablet Take 1 tablet (100 mg total) by mouth every 2 (two) hours as needed. 05/17/12  Yes Tonye Pearson, MD   Review of Systems  Constitutional: Positive for fever and fatigue.  Cardiovascular:        Positive for right breast pain, with swelling and redness.  Gastrointestinal: Positive for vomiting. Negative for abdominal pain.  Neurological: Positive for dizziness.  Psychiatric/Behavioral: Positive for hallucinations.      Objective:   Physical Exam  Nursing note and vitals reviewed. Constitutional: She is oriented to person, place, and time. She appears well-developed and well-nourished. No distress.  HENT:  Head: Normocephalic and atraumatic.  Eyes: EOM are normal.  Neck: Neck supple. No tracheal deviation present.  Cardiovascular: Normal rate.   Pulmonary/Chest: Effort normal. No respiratory distress.  Right breast shows diffuse erythema covering entire areoli and adjacent area.   Musculoskeletal: Normal range of motion.  Neurological: She is alert and oriented to person, place, and time.  Skin: Skin is warm and dry.  Psychiatric: She has a normal mood and affect. Her behavior is normal.   Results for orders placed in visit on 01/15/13  POCT UA - MICROSCOPIC ONLY      Result Value Range   WBC, Ur, HPF, POC 3-14     RBC, urine, microscopic 2-8     Bacteria, U Microscopic neg     Mucus, UA neg     Epithelial cells, urine per micros 0-3     Crystals, Ur, HPF, POC neg     Casts, Ur, LPF, POC neg     Yeast, UA neg    POCT URINALYSIS DIPSTICK      Result Value Range   Color, UA yellow     Clarity, UA clear     Glucose, UA neg     Bilirubin, UA neg     Ketones, UA neg     Spec Grav, UA 1.010     Blood, UA mod     pH, UA 7.5     Protein, UA neg     Urobilinogen, UA 0.2     Nitrite, UA neg     Leukocytes, UA large (3+)    POCT CBC      Result Value Range   WBC 10.7 (*) 4.6 - 10.2 K/uL   Lymph, poc 2.5  0.6 - 3.4   POC LYMPH PERCENT 23.5  10 - 50 %L   MID (cbc) 0.6  0 - 0.9   POC MID % 5.6  0 - 12 %M   POC Granulocyte 7.6 (*) 2 - 6.9   Granulocyte percent 70.9  37 - 80 %G   RBC 4.49  4.04 - 5.48 M/uL   Hemoglobin 13.1  12.2 - 16.2 g/dL   HCT, POC 16.1  09.6 -  47.9 %   MCV 93.2  80 - 97 fL   MCH, POC 29.2  27 - 31.2 pg   MCHC 31.3 (*) 31.8 - 35.4 g/dL  RDW, POC 14.4     Platelet Count, POC 240  142 - 424 K/uL   MPV 9.3  0 - 99.8 fL   Patient appears acutely ill. She's given 1 L of IV fluid and failed to improve. She's crying and having riders.  BP 135/78   Pulse 82   Temp(Src) 97.6 F (36.4 C) (Oral)   Resp 18   Wt 111 lb (50.349 kg)   SpO2 97%     Assessment & Plan:   Acutely ill 62 year old woman with apparent UTI as well as cellulitis of the right breast. She's not able to care for self and her present condition has not responded to IV fluids here.  After discussing situation with patient, we felt that the best avenue for treatment would be transported to emergency room where antibiotic treatment can be assured through intravenous administration and observation continued.    I personally performed the services described in this documentation, which was scribed in my presence. The recorded information has been reviewed and is accurate. Elvina Sidle M.D.

## 2013-01-15 NOTE — Patient Instructions (Addendum)
Subjective:    Patient ID: Madison Collins, female    DOB: 06/04/1950, 62 y.o.   MRN: [id number] This chart was scribed for Elvina Sidle, MD by Valera Castle, ED Scribe. This patient was seen in room 3and the patient's care was started at 11:51 AM.  HPI Madison Collins is a 62 y.o. female who presents to the [facility] complaining of sudden, moderate fever and emesis, onset 6 days ago. She reports associated hallucinations, dizziness, and body aches. She also reports pain under her right shoulder blade, but states she is constantly picking up her 3 grandchildren, so she is unsure what the cause is. She states she went to bed early 4 days  ago, stating she woke up that night with 3 episodes of emesis, and states she did not get out of her bed the next few days. She reports being able to eat and drink normally, but is afraid to eat, because she might throw it up.   She states the thing that worries her the most is her sudden, moderate, right breast pain, with associated redness, onset 3 days ago. She reports just having a mammogram 12/20/2012.  She denies abdominal pain, urinary symptoms, and any other associated symptoms.   Patient's mother died just a few weeks ago. She still grieving over this period  Patient Active Problem List   Diagnosis Date Noted  . Foot dermatitis 11/08/2012  . BMI 29.0-29.9,adult 11/18/2011  . Hepatitis C 05/19/2011  . Hypertension 05/19/2011  . History of migraine headaches 05/19/2011  . Osteoarthritis 05/19/2011  . Insomnia 05/19/2011  . Chronic knee pain 05/19/2011  . Depression with anxiety 05/19/2011  . Cervical disc syndrome 05/19/2011  . Gastroesophageal reflux 05/19/2011   Past Medical History  Diagnosis Date  . Hepatitis C     IA viral load low 02/09    BX past mild fibrosis  . Hypertension   . Insomnia, unspecified   . Knee pain   . Anxiety and depression   . Bulging disc 1990's    c4-5  . Post-menopause 2010  . GERD (gastroesophageal  reflux disease)   . H/O: osteoarthritis   . Arthritis   . Depression   . Cataract     right eye  . Anxiety    Past Surgical History  Procedure Laterality Date  . Bilateral tubal ligation    . Knee surgery      x4 right knee  . Eye surgery    . Tubal ligation     Allergies  Allergen Reactions  . Adhesive [Tape] Rash    "per pt - surgical tape, also causes itching   Prior to Admission medications   Medication Sig Start Date End Date Taking? Authorizing Provider  ALPRAZolam Prudy Feeler) 0.5 MG tablet Take 1 tablet (0.5 mg total) by mouth 3 (three) times daily as needed. 11/08/12  Yes Tonye Pearson, MD  citalopram (CELEXA) 40 MG tablet TAKE 1 TABLET BY MOUTH DAILY 11/08/12  Yes Tonye Pearson, MD  fluocinonide-emollient (LIDEX-E) 0.05 % cream Apply topically 2 (two) times daily. Foot dermatitis 11/08/12  Yes Tonye Pearson, MD  hydrochlorothiazide (MICROZIDE) 12.5 MG capsule TAKE 1 CAPSULE BY MOUTH DAILY 11/08/12  Yes Tonye Pearson, MD  HYDROcodone-acetaminophen (NORCO) 10-325 MG per tablet TAKE 1 TABLET BY MOUTH EVERY 6 HOURS AS NEEDED FOR PAIN 01/08/13  Yes Tonye Pearson, MD  loratadine (CLARITIN) 10 MG tablet Take 10 mg by mouth daily.   Yes Historical Provider, MD  meloxicam (  MOBIC) 15 MG tablet Take 1 tablet (15 mg total) by mouth daily. 05/17/12  Yes Tonye Pearson, MD  Multiple Vitamins-Minerals (MULTI-VITAMIN GUMMIES) CHEW Chew by mouth daily.   Yes Historical Provider, MD  omeprazole (PRILOSEC) 20 MG capsule TAKE 1 CAPSULE BY MOUTH DAILY 11/08/12  Yes Tonye Pearson, MD  ondansetron (ZOFRAN) 4 MG tablet Take 1 tablet (4 mg total) by mouth every 8 (eight) hours as needed for nausea. 05/17/12  Yes Tonye Pearson, MD  SUMAtriptan (IMITREX) 100 MG tablet Take 1 tablet (100 mg total) by mouth every 2 (two) hours as needed. 05/17/12  Yes Tonye Pearson, MD   Review of Systems  Constitutional: Positive for fever and fatigue.  Cardiovascular:        Positive for right breast pain, with swelling and redness.  Gastrointestinal: Positive for vomiting. Negative for abdominal pain.  Neurological: Positive for dizziness.  Psychiatric/Behavioral: Positive for hallucinations.      Objective:   Physical Exam  Nursing note and vitals reviewed. Constitutional: She is oriented to person, place, and time. She appears well-developed and well-nourished. No distress.  HENT:  Head: Normocephalic and atraumatic.  Eyes: EOM are normal.  Neck: Neck supple. No tracheal deviation present.  Cardiovascular: Normal rate.   Pulmonary/Chest: Effort normal. No respiratory distress.  Right breast shows diffuse erythema covering entire areoli and adjacent area.   Musculoskeletal: Normal range of motion.  Neurological: She is alert and oriented to person, place, and time.  Skin: Skin is warm and dry.  Psychiatric: She has a normal mood and affect. Her behavior is normal.   Results for orders placed in visit on 01/15/13  POCT UA - MICROSCOPIC ONLY      Result Value Range   WBC, Ur, HPF, POC 3-14     RBC, urine, microscopic 2-8     Bacteria, U Microscopic neg     Mucus, UA neg     Epithelial cells, urine per micros 0-3     Crystals, Ur, HPF, POC neg     Casts, Ur, LPF, POC neg     Yeast, UA neg    POCT URINALYSIS DIPSTICK      Result Value Range   Color, UA yellow     Clarity, UA clear     Glucose, UA neg     Bilirubin, UA neg     Ketones, UA neg     Spec Grav, UA 1.010     Blood, UA mod     pH, UA 7.5     Protein, UA neg     Urobilinogen, UA 0.2     Nitrite, UA neg     Leukocytes, UA large (3+)    POCT CBC      Result Value Range   WBC 10.7 (*) 4.6 - 10.2 K/uL   Lymph, poc 2.5  0.6 - 3.4   POC LYMPH PERCENT 23.5  10 - 50 %L   MID (cbc) 0.6  0 - 0.9   POC MID % 5.6  0 - 12 %M   POC Granulocyte 7.6 (*) 2 - 6.9   Granulocyte percent 70.9  37 - 80 %G   RBC 4.49  4.04 - 5.48 M/uL   Hemoglobin 13.1  12.2 - 16.2 g/dL   HCT, POC 16.1  09.6 -  47.9 %   MCV 93.2  80 - 97 fL   MCH, POC 29.2  27 - 31.2 pg   MCHC 31.3 (*) 31.8 - 35.4 g/dL  RDW, POC 14.4     Platelet Count, POC 240  142 - 424 K/uL   MPV 9.3  0 - 99.8 fL   Patient appears acutely ill. She's given 1 L of IV fluid and failed to improve. She's crying and having riders.  BP 135/78  Pulse 82  Temp(Src) 97.6 F (36.4 C) (Oral)  Resp 18  Wt 111 lb (50.349 kg)  SpO2 97%     Assessment & Plan:   Acutely ill 33-year-old woman with apparent UTI as well as cellulitis of the right breast. She's not able to care for self and her present condition has not responded to IV fluids here.  After discussing situation with patient, we felt that the best avenue for treatment would be transported to emergency room where antibiotic treatment can be assured through intravenous administration and observation continued.    I personally performed the services described in this documentation, which was scribed in my presence. The recorded information has been reviewed and is accurate. Elvina Sidle M.D.

## 2013-01-16 LAB — URINE CULTURE

## 2013-01-23 ENCOUNTER — Ambulatory Visit (INDEPENDENT_AMBULATORY_CARE_PROVIDER_SITE_OTHER): Payer: BC Managed Care – PPO | Admitting: Family Medicine

## 2013-01-23 ENCOUNTER — Ambulatory Visit: Payer: BC Managed Care – PPO

## 2013-01-23 VITALS — BP 152/60 | HR 88 | Temp 98.0°F | Resp 18 | Ht 62.0 in | Wt 170.0 lb

## 2013-01-23 DIAGNOSIS — N390 Urinary tract infection, site not specified: Secondary | ICD-10-CM

## 2013-01-23 DIAGNOSIS — R079 Chest pain, unspecified: Secondary | ICD-10-CM

## 2013-01-23 DIAGNOSIS — N61 Mastitis without abscess: Secondary | ICD-10-CM

## 2013-01-23 LAB — POCT UA - MICROSCOPIC ONLY
Casts, Ur, LPF, POC: NEGATIVE
Crystals, Ur, HPF, POC: NEGATIVE
Mucus, UA: NEGATIVE
Renal tubular cells: POSITIVE
Yeast, UA: NEGATIVE

## 2013-01-23 LAB — POCT CBC
Granulocyte percent: 47.6 %G (ref 37–80)
HCT, POC: 42.6 % (ref 37.7–47.9)
Hemoglobin: 12.9 g/dL (ref 12.2–16.2)
Lymph, poc: 3.8 — AB (ref 0.6–3.4)
MCH, POC: 28.4 pg (ref 27–31.2)
MCHC: 30.3 g/dL — AB (ref 31.8–35.4)
MCV: 93.6 fL (ref 80–97)
MID (cbc): 0.6 (ref 0–0.9)
MPV: 8.6 fL (ref 0–99.8)
POC Granulocyte: 4 (ref 2–6.9)
POC LYMPH PERCENT: 45.5 %L (ref 10–50)
POC MID %: 6.9 %M (ref 0–12)
Platelet Count, POC: 372 10*3/uL (ref 142–424)
RBC: 4.55 M/uL (ref 4.04–5.48)
RDW, POC: 14.1 %
WBC: 8.3 10*3/uL (ref 4.6–10.2)

## 2013-01-23 LAB — POCT URINALYSIS DIPSTICK
Bilirubin, UA: NEGATIVE
Glucose, UA: NEGATIVE
Ketones, UA: NEGATIVE
Leukocytes, UA: NEGATIVE
Nitrite, UA: NEGATIVE
Protein, UA: NEGATIVE
Spec Grav, UA: <=1.005
Urobilinogen, UA: 0.2
pH, UA: 5.5

## 2013-01-23 MED ORDER — SULFAMETHOXAZOLE-TMP DS 800-160 MG PO TABS: 1.0000 | ORAL_TABLET | Freq: Two times a day (BID) | ORAL | Status: DC

## 2013-01-23 NOTE — Progress Notes (Addendum)
Subjective:    Patient ID: Madison Collins, female    DOB: 02-27-1950, 62 y.o.   MRN: [id number] This chart was scribed for Elvina Sidle, MD by Clydene Laming, ED Scribe. This patient was seen in room 8 and the patient's care was started at 4:59 PM. HPI HPI Comments: ROSLIN Collins is a 62 y.o. female who presents to the [facility] complaining of a hospitalization follow up. Pt states she is still in pain and breast is still tender. Pt also states her appetite decreased. She was released from the hospital because not puss pockets were found. Pt states since being released she has not felt any better. She is worried because her mom experienced similar symptoms before dying from lung cancer. Pt had a mammogram 10/29 and had to get her breast rechecked.   Patient Active Problem List   Diagnosis Date Noted   Foot dermatitis 11/08/2012   BMI 29.0-29.9,adult 11/18/2011   Hepatitis C 05/19/2011   Hypertension 05/19/2011   History of migraine headaches 05/19/2011   Osteoarthritis 05/19/2011   Insomnia 05/19/2011   Chronic knee pain 05/19/2011   Depression with anxiety 05/19/2011   Cervical disc syndrome 05/19/2011   Gastroesophageal reflux 05/19/2011   Past Medical History  Diagnosis Date   Hepatitis C     IA viral load low 02/09    BX past mild fibrosis   Hypertension    Insomnia, unspecified    Knee pain    Anxiety and depression    Bulging disc 1990's    c4-5   Post-menopause 2010   GERD (gastroesophageal reflux disease)    H/O: osteoarthritis    Arthritis    Depression    Cataract     right eye   Anxiety    Past Surgical History  Procedure Laterality Date   Bilateral tubal ligation     Knee surgery      x4 right knee   Eye surgery     Tubal ligation     Allergies  Allergen Reactions   Latex Other (See Comments)    "Latex tape used during surgery - breaks my skin out"   Adhesive [Tape] Rash    "per pt -  surgical tape, also causes itching   Prior to Admission medications   Medication Sig Start Date End Date Taking? Authorizing Provider  ALPRAZolam Prudy Feeler) 0.5 MG tablet Take 0.5 mg by mouth 3 (three) times daily as needed for anxiety.   Yes Historical Provider, MD  citalopram (CELEXA) 40 MG tablet Take 40 mg by mouth every morning.   Yes Historical Provider, MD  fluocinonide-emollient (LIDEX-E) 0.05 % cream Apply topically 2 (two) times daily. Foot dermatitis 11/08/12  Yes Tonye Pearson, MD  hydrochlorothiazide (MICROZIDE) 12.5 MG capsule Take 12.5 mg by mouth every morning.   Yes Historical Provider, MD  HYDROcodone-acetaminophen (NORCO) 10-325 MG per tablet Take 1 tablet by mouth every 6 (six) hours as needed (For pain.).   Yes Historical Provider, MD  ibuprofen (ADVIL,MOTRIN) 200 MG tablet Take 400 mg by mouth every 6 (six) hours as needed (For pain.).   Yes Historical Provider, MD  meloxicam (MOBIC) 15 MG tablet Take 15 mg by mouth every morning.   Yes Historical Provider, MD  Multiple Vitamins-Minerals (MULTI-VITAMIN GUMMIES) CHEW Chew 1 tablet by mouth every morning.    Yes Historical Provider, MD  omeprazole (PRILOSEC) 20 MG capsule Take 20 mg by mouth every morning.   Yes Historical Provider, MD  ondansetron (  ZOFRAN) 4 MG tablet Take 1 tablet (4 mg total) by mouth every 6 (six) hours. 01/15/13  Yes Kristen N Ward, DO  oxyCODONE-acetaminophen (PERCOCET/ROXICET) 5-325 MG per tablet Take 1 tablet by mouth every 4 (four) hours as needed. 01/15/13  Yes Kristen N Ward, DO  sulfamethoxazole-trimethoprim (BACTRIM DS) 800-160 MG per tablet Take 1 tablet by mouth 2 (two) times daily. 01/15/13  Yes Kristen N Ward, DO  SUMAtriptan (IMITREX) 100 MG tablet Take 100 mg by mouth every 2 (two) hours as needed for migraine or headache. May repeat in 2 hours if headache persists or recurs.   Yes Historical Provider, MD  loratadine (CLARITIN) 10 MG tablet Take 10 mg by mouth daily as needed for allergies.      Historical Provider, MD   History   Social History   Marital Status: Widowed    Spouse Name: N/A    Number of Children: N/A   Years of Education: N/A   Occupational History   Corporate investment banker    Social History Main Topics   Smoking status: Never Smoker    Smokeless tobacco: Not on file   Alcohol Use: Yes     Comment: a glass of wine/month   Drug Use: Not on file   Sexual Activity: Yes   Other Topics Concern   Not on file   Social History Narrative   Significant other. Education: [employer]. Exercise: Walks three times a week.       Review of Systems     Objective:   Physical Exam Filed Vitals:   01/23/13 1541  BP: 152/60  Pulse: 88  Temp: 98 F (36.7 C)  Resp: 18   Results for orders placed during the hospital encounter of 01/15/13  URINE CULTURE      Result Value Range   Specimen Description URINE, CLEAN CATCH     Special Requests NONE     Culture  Setup Time       Value: 01/15/2013 22:53     Performed at [facility] Count       Value: NO GROWTH     Performed at [facility]   Culture       Value: NO GROWTH     Performed at [facility]   Report Status 01/16/2013 FINAL    BASIC METABOLIC PANEL      Result Value Range   Sodium 140  135 - 145 mEq/L   Potassium 3.5  3.5 - 5.1 mEq/L   Chloride 107  96 - 112 mEq/L   CO2 21  19 - 32 mEq/L   Glucose, Bld 98  70 - 99 mg/dL   BUN 12  6 - 23 mg/dL   Creatinine, Ser 4.09  0.50 - 1.10 mg/dL   Calcium 9.0  8.4 - 81.1 mg/dL   GFR calc non Af Amer >90  >90 mL/min   GFR calc Af Amer >90  >90 mL/min   Right breast still mildly erythematous, though much less so, and minimally tender with no induration Chest: clear Results for orders placed in visit on 01/23/13  POCT CBC      Result Value Range   WBC 8.3  4.6 - 10.2 K/uL   Lymph, poc 3.8 (*) 0.6 - 3.4   POC LYMPH PERCENT 45.5  10 - 50 %L   MID (cbc) 0.6  0 - 0.9   POC MID % 6.9  0 - 12 %M   POC Granulocyte 4.0  2 -  6.9   Granulocyte percent 47.6  37 - 80 %G   RBC 4.55  4.04 - 5.48 M/uL   Hemoglobin 12.9  12.2 - 16.2 g/dL   HCT, POC 44.0  10.2 - 47.9 %   MCV 93.6  80 - 97 fL   MCH, POC 28.4  27 - 31.2 pg   MCHC 30.3 (*) 31.8 - 35.4 g/dL   RDW, POC 72.5     Platelet Count, POC 372  142 - 424 K/uL   MPV 8.6  0 - 99.8 fL  POCT UA - MICROSCOPIC ONLY      Result Value Range   WBC, Ur, HPF, POC 0-1     RBC, urine, microscopic 1-3     Bacteria, U Microscopic trace     Mucus, UA neg     Epithelial cells, urine per micros 0-1     Crystals, Ur, HPF, POC neg     Casts, Ur, LPF, POC neg     Yeast, UA neg     Renal tubular cells pos    POCT URINALYSIS DIPSTICK      Result Value Range   Color, UA pale     Clarity, UA clear     Glucose, UA neg     Bilirubin, UA neg     Ketones, UA neg     Spec Grav, UA <=1.005     Blood, UA mod     pH, UA 5.5     Protein, UA neg     Urobilinogen, UA 0.2     Nitrite, UA neg     Leukocytes, UA Negative        [facility] reading (PRIMARY) by  Dr. Milus Glazier:  Negative chest.      Assessment & Plan:  5:05 PM- Discussed treatment plan with pt at bedside. Pt verbalized understanding and agreement with plan.  I personally performed the services described in this documentation, which was scribed in my presence. The recorded information has been reviewed and is accurate. Labs have stabilized and patient has clinical improvement although it is slow.  Plan: At best patient to continue her current medications and recheck in one week. Cellulitis of female breast - Plan: POCT CBC, DG Chest 2 View, sulfamethoxazole-trimethoprim (BACTRIM DS) 800-160 MG per tablet  Infection of urinary tract - Plan: POCT UA - Microscopic Only, POCT urinalysis dipstick  Chest pain, unspecified - Plan: DG Chest 2 View  Signed, Elvina Sidle, MD   Signed, and Elvina Sidle, MD

## 2013-01-25 NOTE — Progress Notes (Signed)
Spoke with patient and gave normal lab results message from Dr. Milus Glazier. Patient thanked me for calling.

## 2013-01-29 ENCOUNTER — Other Ambulatory Visit: Payer: Self-pay

## 2013-02-05 ENCOUNTER — Telehealth: Payer: Self-pay

## 2013-02-05 NOTE — Telephone Encounter (Signed)
Please advise 

## 2013-02-05 NOTE — Telephone Encounter (Signed)
PT IN NEED OF HER NORCO. PLEASE CALL [phone] WHEN READY FOR PICK UP

## 2013-02-06 MED ORDER — HYDROCODONE-ACETAMINOPHEN 10-325 MG PO TABS: 1.0000 | ORAL_TABLET | Freq: Four times a day (QID) | ORAL | Status: DC | PRN

## 2013-02-06 NOTE — Telephone Encounter (Signed)
I do not have this rx at the desk, do you know where it ended up?

## 2013-02-06 NOTE — Telephone Encounter (Signed)
I believe that neurology is managing her pain with nortriptylline now.  Please ask them to advise.

## 2013-02-06 NOTE — Telephone Encounter (Signed)
What was she to see neurology for? She was placed on norco for cellulitis of the breast. I would like to advise her if this is still painful for her we need to recheck, but I see your note about Neurology, am I missing something?

## 2013-02-06 NOTE — Telephone Encounter (Signed)
Patient initially has a cellulitis of the right breast which should be resolved now.  I don't have a reason to refill the Norco.  Please ask paatient to return to recheck the right breast if she is still requiring pain medicine for it.

## 2013-02-06 NOTE — Addendum Note (Signed)
Addended by: Elvina Sidle on: 02/06/2013 01:08 PM   Modules accepted: Orders

## 2013-02-06 NOTE — Telephone Encounter (Signed)
Refill ordered.  Recheck if persists beyond Thursday

## 2013-02-07 NOTE — Telephone Encounter (Signed)
She states she can not come in until Friday advised her to come in then.

## 2013-02-07 NOTE — Telephone Encounter (Signed)
Called her to advise.  

## 2013-02-09 ENCOUNTER — Ambulatory Visit (INDEPENDENT_AMBULATORY_CARE_PROVIDER_SITE_OTHER): Payer: BC Managed Care – PPO | Admitting: Internal Medicine

## 2013-02-09 ENCOUNTER — Other Ambulatory Visit: Payer: Self-pay | Admitting: Internal Medicine

## 2013-02-09 VITALS — BP 154/70 | HR 87 | Temp 97.6°F | Resp 16 | Ht 61.25 in | Wt 167.0 lb

## 2013-02-09 DIAGNOSIS — N61 Mastitis without abscess: Secondary | ICD-10-CM

## 2013-02-09 DIAGNOSIS — M5010 Cervical disc disorder with radiculopathy, unspecified cervical region: Secondary | ICD-10-CM

## 2013-02-09 DIAGNOSIS — M5000 Cervical disc disorder with myelopathy, unspecified cervical region: Secondary | ICD-10-CM

## 2013-02-09 MED ORDER — HYDROCODONE-ACETAMINOPHEN 10-325 MG PO TABS: 1.0000 | ORAL_TABLET | Freq: Four times a day (QID) | ORAL | Status: DC | PRN

## 2013-02-11 NOTE — Progress Notes (Signed)
   Subjective:    Patient ID: Madison Collins, female    DOB: 01/29/1951, 62 y.o.   MRN: [id number]  HPI followup for chronic neck pain as well as for recent cellulitis breast. Note extensive workup including ultrasounds of recent breast problem. She is now the end of the third round of antibiotics and has just noted success over the last 2-3 days. There is no longer a skin change on the right breast or tenderness. There never was nipple discharge. This infection started a week after manipulation for mammography which was uncomfortable.  Her neck continues to be a problem and she remains active only with chronic pain medicine use. Her other medical problems are stable Patient Active Problem List   Diagnosis Date Noted  . Foot dermatitis 11/08/2012  . BMI 29.0-29.9,adult 11/18/2011  . Hepatitis C 05/19/2011  . Hypertension 05/19/2011  . History of migraine headaches 05/19/2011  . Osteoarthritis 05/19/2011  . Insomnia 05/19/2011  . Chronic knee pain 05/19/2011  . Depression with anxiety 05/19/2011  . Cervical disc syndrome 05/19/2011  . Gastroesophageal reflux 05/19/2011       Review of Systems No fever chills or night sweats No weight loss No chest pain or palpitations Reflux is stable    Objective:   Physical Exam BP 154/70  Pulse 87  Temp(Src) 97.6 F (36.4 C) (Oral)  Resp 16  Ht 5' 1.25" (1.556 m)  Wt 167 lb (75.751 kg)  BMI 31.29 kg/m2  SpO2 98% HEENT is clear Heart regular without murmur Right breast without masses, abnormal architecture, nipple discharge, or tenderness. No axillary adenopathy Neck range of motion guarded as usual No peripheral edema Mood and affect stable       Assessment & Plan:  Cervical disc syndrome - Plan: HYDROcodone-acetaminophen (NORCO) 10-325 MG per tablet, 3 months Chronic knee pain  Cellulitis of female breast--in agreement with last ultrasound she should be restudied now she's completed therapy  To call at once if any  redness reappears  Other problems stable/call if needs med refills  Has appointment for followup in February

## 2013-02-12 ENCOUNTER — Other Ambulatory Visit: Payer: Self-pay

## 2013-02-12 ENCOUNTER — Telehealth: Payer: Self-pay

## 2013-02-12 ENCOUNTER — Other Ambulatory Visit: Payer: Self-pay | Admitting: Internal Medicine

## 2013-02-12 DIAGNOSIS — N61 Mastitis without abscess: Secondary | ICD-10-CM

## 2013-02-13 ENCOUNTER — Ambulatory Visit
Admission: RE | Admit: 2013-02-13 | Discharge: 2013-02-13 | Disposition: A | Discharge status: Home / Self Care | Payer: BC Managed Care – PPO | Source: Ambulatory Visit | Attending: Internal Medicine | Admitting: Internal Medicine

## 2013-02-13 DIAGNOSIS — N61 Mastitis without abscess: Secondary | ICD-10-CM

## 2013-02-16 ENCOUNTER — Telehealth: Payer: Self-pay

## 2013-02-16 MED ORDER — SULFAMETHOXAZOLE-TMP DS 800-160 MG PO TABS: 1.0000 | ORAL_TABLET | Freq: Two times a day (BID) | ORAL | Status: DC

## 2013-02-16 NOTE — Telephone Encounter (Signed)
PT STATES SHE HAD SEEN DR DOOLITTLE AND WAS TOLD IF SHE NEEDED MORE ANTIBIOTICS TO GIVE [place] A CALL AND WE WILL GIVE HER MORE WITHOUT [place] HAVING TO CALL HER HER NUMBER IS [phone]    [facility] [address] ON [address]

## 2013-02-16 NOTE — Telephone Encounter (Signed)
Septra DS  Sent to pharmacy Please notify her [phone]  Recurrence of breast cellulitis Note recent breast ultrasound showed fluid but no infection

## 2013-02-17 NOTE — Telephone Encounter (Signed)
Pt.notified

## 2013-03-12 ENCOUNTER — Ambulatory Visit (INDEPENDENT_AMBULATORY_CARE_PROVIDER_SITE_OTHER): Payer: BC Managed Care – PPO | Admitting: Family Medicine

## 2013-03-12 VITALS — BP 124/66 | HR 81 | Temp 97.5°F | Resp 16 | Ht 61.25 in | Wt 169.2 lb

## 2013-03-12 DIAGNOSIS — N61 Mastitis without abscess: Secondary | ICD-10-CM

## 2013-03-12 MED ORDER — SULFAMETHOXAZOLE-TMP DS 800-160 MG PO TABS: 1.0000 | ORAL_TABLET | Freq: Two times a day (BID) | ORAL | Status: DC

## 2013-03-12 NOTE — Progress Notes (Signed)
   Subjective:    Patient ID: Madison Collins, female    DOB: 10-31-1950, 63 y.o.   MRN: [id number]  HPI    Review of Systems     Objective:   Physical Exam No acute distress Chest: Clear Examination of breasts reveals no nodules and no erythema. Right axilla shows no adenopathy Right breast is slightly tender     Assessment & Plan:  No clear-cut signs of cellulitis but given the fact that this is exactly how cellulitis began before, I think early treatment is more cost-effective and prudent at this time Mastitis, right, acute - Plan: sulfamethoxazole-trimethoprim (BACTRIM DS) 800-160 MG per tablet  Signed, Robyn Haber, MD

## 2013-04-02 ENCOUNTER — Ambulatory Visit (INDEPENDENT_AMBULATORY_CARE_PROVIDER_SITE_OTHER): Payer: BC Managed Care – PPO | Admitting: Internal Medicine

## 2013-04-02 VITALS — BP 130/64 | HR 74 | Temp 98.1°F | Resp 18 | Ht 61.75 in | Wt 169.4 lb

## 2013-04-02 DIAGNOSIS — B1920 Unspecified viral hepatitis C without hepatic coma: Secondary | ICD-10-CM

## 2013-04-02 DIAGNOSIS — R0789 Other chest pain: Secondary | ICD-10-CM

## 2013-04-02 DIAGNOSIS — R5383 Other fatigue: Secondary | ICD-10-CM

## 2013-04-02 DIAGNOSIS — R071 Chest pain on breathing: Secondary | ICD-10-CM

## 2013-04-02 DIAGNOSIS — M546 Pain in thoracic spine: Secondary | ICD-10-CM

## 2013-04-02 LAB — POCT CBC
Granulocyte percent: 52.5 %G (ref 37–80)
HCT, POC: 36.2 % — AB (ref 37.7–47.9)
Hemoglobin: 11.3 g/dL — AB (ref 12.2–16.2)
Lymph, poc: 3 (ref 0.6–3.4)
MCH: 29 pg (ref 27–31.2)
MCHC: 31.2 g/dL — AB (ref 31.8–35.4)
MCV: 92.8 fL (ref 80–97)
MID (CBC): 0.5 (ref 0–0.9)
MPV: 9.2 fL (ref 0–99.8)
POC Granulocyte: 3.9 (ref 2–6.9)
POC LYMPH %: 40.5 % (ref 10–50)
POC MID %: 7 %M (ref 0–12)
Platelet Count, POC: 214 10*3/uL (ref 142–424)
RBC: 3.9 M/uL — AB (ref 4.04–5.48)
RDW, POC: 13.4 %
WBC: 7.5 10*3/uL (ref 4.6–10.2)

## 2013-04-02 LAB — POCT SEDIMENTATION RATE: POCT SED RATE: 33 mm/hr — AB (ref 0–22)

## 2013-04-02 NOTE — Progress Notes (Signed)
This chart was scribed for Madison Lin, MD by Madison Collins, ED Scribe. This patient was seen in room 4 and the patient's care was started at 6:45 PM. Subjective:    Patient ID: Madison Collins, female    DOB: 02/03/1951, 63 y.o.   MRN: [id number] Chief Complaint  Patient presents with  . Cellulitis    shoulder and breast right side    HPI HPI Comments: Madison Collins is a 63 y.o. female who presents to [facility] complaining of right breast cellulitis that started 2 months ago. Pt states that she has irritation to her left shoulder and her right breast.  Pt denies any drainage of the area. Multiple rounds of antibiotics cleared the redness tho sl swelling continues.  She reports not has not been feeling well for several weeks. Pt states she feels really tired and she does not thing its due to her prescribed medication. Pt states that the current symptoms she is experiencing where the same symptoms her mother experienced before she was diagnosed with lung cancer. Pt is currently concerned that this is similar to what her mother experienced.  She denies any fever, chills, diaphoresis, or sleep disturbances.  Pt is also complaining of this thoracic back pain, different from her chronic neck pain . Located in the area of the right scapular border although deep. This gets worse with movement and occasionally with deep breathing. Wakes her. aches all the time.She states that if she sits still for a long period of time her back begins to feel sore. No injury. Chest x-ray evaluation this was negative. Getting progressively worse over 6-8 weeks. No shortness of breath or cough. Pt reports taking OTC medication with minimal to no relief, she also tried to ICE the are but with not relief. She denies any heavy lifting recently. She denies any SOB or cough. Her chronic pain medication does not resolve this back pain  Patient Active Problem List   Diagnosis Date Noted  . Foot  dermatitis 11/08/2012  . BMI 29.0-29.9,adult 11/18/2011  . Hepatitis C--- last RNA was -3-4 years ago although she wonders if this could be the cause of her recent fatigue  05/19/2011  . Hypertension 05/19/2011  . History of migraine headaches 05/19/2011  . Osteoarthritis 05/19/2011  . Insomnia 05/19/2011  . Chronic knee pain 05/19/2011  . Depression with anxiety 05/19/2011  . Cervical disc syndrome 05/19/2011  . Gastroesophageal reflux 05/19/2011   Past Medical History  Diagnosis Date  . Hepatitis C     IA viral load low 02/09    BX past mild fibrosis  . Hypertension   . Insomnia, unspecified   . Knee pain   . Anxiety and depression   . Bulging disc 1990's    c4-5  . Post-menopause 2010  . GERD (gastroesophageal reflux disease)   . H/O: osteoarthritis   . Arthritis   . Depression   . Cataract     right eye  . Anxiety    Past Surgical History  Procedure Laterality Date  . Bilateral tubal ligation    . Knee surgery      x4 right knee  . Eye surgery    . Tubal ligation     Allergies  Allergen Reactions  . Latex Other (See Comments)    "Latex tape used during surgery - breaks my skin out"  . Adhesive [Tape] Rash    "per pt - surgical tape, also causes itching   Prior to Admission  medications   Medication Sig Start Date End Date Taking? Authorizing Provider  ALPRAZolam Duanne Moron) 0.5 MG tablet Take 0.5 mg by mouth 3 (three) times daily as needed for anxiety.   Yes Historical Provider, MD  citalopram (CELEXA) 40 MG tablet Take 40 mg by mouth every morning.   Yes Historical Provider, MD  fluocinonide-emollient (LIDEX-E) 0.05 % cream Apply topically 2 (two) times daily. Foot dermatitis 11/08/12  Yes Leandrew Koyanagi, MD  hydrochlorothiazide (MICROZIDE) 12.5 MG capsule Take 12.5 mg by mouth every morning.   Yes Historical Provider, MD  HYDROcodone-acetaminophen [facility]) 10-325 MG per tablet  01/08/13  Yes Historical Provider, MD  loratadine (CLARITIN) 10 MG tablet Take 10 mg  by mouth daily as needed for allergies.    Yes Historical Provider, MD  meloxicam (MOBIC) 15 MG tablet Take 15 mg by mouth every morning.   Yes Historical Provider, MD  Multiple Vitamins-Minerals (MULTI-VITAMIN GUMMIES) CHEW Chew 1 tablet by mouth every morning.    Yes Historical Provider, MD  omeprazole (PRILOSEC) 20 MG capsule Take 20 mg by mouth every morning.   Yes Historical Provider, MD  sulfamethoxazole-trimethoprim (BACTRIM DS) 800-160 MG per tablet Take 1 tablet by mouth 2 (two) times daily. 03/12/13  Yes Robyn Haber, MD  SUMAtriptan (IMITREX) 100 MG tablet Take 100 mg by mouth every 2 (two) hours as needed for migraine or headache. May repeat in 2 hours if headache persists or recurs.   Yes Historical Provider, MD   History   Social History  . Marital Status: Widowed    Spouse Name: N/A    Number of Children: N/A  . Years of Education: N/A   Occupational History  . Restaurant Mgr    Social History Main Topics  . Smoking status: Never Smoker   . Smokeless tobacco: Not on file  . Alcohol Use: Yes     Comment: a glass of wine/month  . Drug Use: Not on file  . Sexual Activity: Yes   Other Topics Concern  . Not on file   Social History Narrative   Significant other. Education: [employer]. Exercise: Walks three times a week.   Review of Systems A complete 10 system review of systems was obtained and all systems are negative except as noted in the HPI and PMH.      Triage Vitals: BP 130/64  Pulse 74  Temp(Src) 98.1 F (36.7 C) (Oral)  Resp 18  Ht 5' 1.75" (1.568 m)  Wt 169 lb 6.4 oz (76.839 kg)  BMI 31.25 kg/m2  SpO2 98% Objective:   Physical Exam  Nursing note and vitals reviewed. Constitutional: She is oriented to person, place, and time. She appears well-developed and well-nourished. No distress.  HENT:  Head: Normocephalic and atraumatic.  Eyes: EOM are normal.  Neck: Neck supple. No tracheal deviation present.  Cardiovascular: Normal rate.     Pulmonary/Chest: Effort normal. No respiratory distress.  Musculoskeletal: Normal range of motion.  Tenderness to deep palpation along the right scapula border with mild discomfort on twisting, but not on deep breathing. No swelling or spasm noted. Spine nontender. Neck flexion creates discomfort along right scapula. Right shoulder exam normal.   Neurological: She is alert and oriented to person, place, and time.  Skin: Skin is warm and dry.  Right breast without signs of cellulitis but with nodular areas below the nipple, not present on the left.  Psychiatric: She has a normal mood and affect. Her behavior is normal.   Sleep ok  Assessment & Plan:  Hepatitis C - Plan: POCT CBC, POCT SEDIMENTATION RATE, COMPLETE METABOLIC PANEL WITH GFR, TSH, Hepatitis C RNA quantitative  Fatigue - Plan: POCT CBC, POCT SEDIMENTATION RATE, COMPLETE METABOLIC PANEL WITH GFR, TSH, Hepatitis C RNA quantitative  Chest wall pain/prolonged thoracic pain -it is time to proceed with CT of the chest  Breast problems--follow off antibios  Heat/stretch   I have completed the patient encounter in its entirety as documented by the scribe, with editing by me where necessary. Katori Wirsing P. Laney Pastor, M.D.

## 2013-04-03 LAB — TSH: TSH: 0.933 u[IU]/mL (ref 0.350–4.500)

## 2013-04-03 LAB — COMPLETE METABOLIC PANEL WITH GFR
ALBUMIN: 4.3 g/dL (ref 3.5–5.2)
ALT: 34 U/L (ref 0–35)
AST: 22 U/L (ref 0–37)
Alkaline Phosphatase: 70 U/L (ref 39–117)
BUN: 18 mg/dL (ref 6–23)
CO2: 26 mEq/L (ref 19–32)
Calcium: 9.3 mg/dL (ref 8.4–10.5)
Chloride: 102 mEq/L (ref 96–112)
Creat: 0.62 mg/dL (ref 0.50–1.10)
GLUCOSE: 83 mg/dL (ref 70–99)
POTASSIUM: 4.2 meq/L (ref 3.5–5.3)
Sodium: 137 mEq/L (ref 135–145)
Total Bilirubin: 0.4 mg/dL (ref 0.2–1.2)
Total Protein: 7.5 g/dL (ref 6.0–8.3)

## 2013-04-04 LAB — HEPATITIS C RNA QUANTITATIVE
HCV Quantitative Log: 5.83 {Log} — ABNORMAL HIGH (ref <1.18)
HCV Quantitative: 675721 IU/mL — ABNORMAL HIGH (ref <15)

## 2013-04-10 ENCOUNTER — Other Ambulatory Visit: Payer: BC Managed Care – PPO

## 2013-04-13 NOTE — Addendum Note (Signed)
Addended by: Leandrew Koyanagi on: 04/13/2013 03:21 PM   Modules accepted: Orders

## 2013-04-17 ENCOUNTER — Telehealth: Payer: Self-pay

## 2013-04-17 NOTE — Telephone Encounter (Signed)
PT STATES THE LAB HAD GIVEN HER THE RESULTS, BUT DR DOOLITTLE WAS GOING TO EXPLORE ANOTHER OPTION FOR HER AND GIVE HER A CALL, IT'S BEEN OVER A WEEK AND SHE  HASN'T HEARD FROM HIM. ONLY WANT A CALL BACK FROM DR Laney Pastor AT [phone]

## 2013-04-18 NOTE — Telephone Encounter (Signed)
Pt lab results were normal. She states you were going to call around about other treatment options for fatigue? Please advise.

## 2013-04-18 NOTE — Telephone Encounter (Signed)
Ref to ID Dr Linus Salmons for hep c active now  Chest pain resolved w/ massage so cancel CT F/u as sched in 3 wks

## 2013-04-26 ENCOUNTER — Other Ambulatory Visit: Payer: BC Managed Care – PPO

## 2013-05-02 ENCOUNTER — Ambulatory Visit (INDEPENDENT_AMBULATORY_CARE_PROVIDER_SITE_OTHER): Payer: BC Managed Care – PPO | Admitting: Internal Medicine

## 2013-05-02 ENCOUNTER — Encounter: Payer: Self-pay | Admitting: Internal Medicine

## 2013-05-02 VITALS — BP 140/68 | HR 99 | Temp 97.4°F | Resp 16 | Ht 61.0 in | Wt 165.2 lb

## 2013-05-02 DIAGNOSIS — G8929 Other chronic pain: Secondary | ICD-10-CM

## 2013-05-02 DIAGNOSIS — I10 Essential (primary) hypertension: Secondary | ICD-10-CM

## 2013-05-02 DIAGNOSIS — F341 Dysthymic disorder: Secondary | ICD-10-CM

## 2013-05-02 DIAGNOSIS — K219 Gastro-esophageal reflux disease without esophagitis: Secondary | ICD-10-CM

## 2013-05-02 DIAGNOSIS — Z8669 Personal history of other diseases of the nervous system and sense organs: Secondary | ICD-10-CM

## 2013-05-02 DIAGNOSIS — G4700 Insomnia, unspecified: Secondary | ICD-10-CM

## 2013-05-02 DIAGNOSIS — M1990 Unspecified osteoarthritis, unspecified site: Secondary | ICD-10-CM

## 2013-05-02 DIAGNOSIS — M25569 Pain in unspecified knee: Secondary | ICD-10-CM

## 2013-05-02 DIAGNOSIS — F418 Other specified anxiety disorders: Secondary | ICD-10-CM

## 2013-05-02 DIAGNOSIS — B1920 Unspecified viral hepatitis C without hepatic coma: Secondary | ICD-10-CM

## 2013-05-02 MED ORDER — CITALOPRAM HYDROBROMIDE 40 MG PO TABS: 40.0000 mg | ORAL_TABLET | Freq: Every morning | ORAL | Status: DC

## 2013-05-02 MED ORDER — HYDROCODONE-ACETAMINOPHEN 10-325 MG PO TABS: 1.0000 | ORAL_TABLET | Freq: Four times a day (QID) | ORAL | Status: DC | PRN

## 2013-05-02 MED ORDER — ALPRAZOLAM 0.5 MG PO TABS: 0.5000 mg | ORAL_TABLET | Freq: Three times a day (TID) | ORAL | Status: DC | PRN

## 2013-05-02 MED ORDER — MELOXICAM 15 MG PO TABS: 15.0000 mg | ORAL_TABLET | Freq: Every morning | ORAL | Status: DC

## 2013-05-02 MED ORDER — SUMATRIPTAN SUCCINATE 100 MG PO TABS
100.0000 mg | ORAL_TABLET | ORAL | Status: DC | PRN
Start: 2013-05-02 — End: 2015-04-02

## 2013-05-02 MED ORDER — HYDROCHLOROTHIAZIDE 12.5 MG PO CAPS: 12.5000 mg | ORAL_CAPSULE | Freq: Every morning | ORAL | Status: DC

## 2013-05-02 MED ORDER — OMEPRAZOLE 20 MG PO CPDR: 20.0000 mg | DELAYED_RELEASE_CAPSULE | Freq: Every morning | ORAL | Status: DC
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
## 2013-05-09 ENCOUNTER — Ambulatory Visit: Payer: BC Managed Care – PPO | Admitting: Internal Medicine

## 2013-05-29 ENCOUNTER — Other Ambulatory Visit (INDEPENDENT_AMBULATORY_CARE_PROVIDER_SITE_OTHER): Payer: BC Managed Care – PPO

## 2013-05-29 DIAGNOSIS — B1920 Unspecified viral hepatitis C without hepatic coma: Secondary | ICD-10-CM

## 2013-05-30 LAB — CBC WITH DIFFERENTIAL/PLATELET
Basophils Absolute: 0 10*3/uL (ref 0.0–0.1)
Basophils Relative: 0 % (ref 0–1)
EOS ABS: 0.1 10*3/uL (ref 0.0–0.7)
Eosinophils Relative: 2 % (ref 0–5)
HCT: 38.5 % (ref 36.0–46.0)
HEMOGLOBIN: 13.1 g/dL (ref 12.0–15.0)
LYMPHS PCT: 39 % (ref 12–46)
Lymphs Abs: 2.4 10*3/uL (ref 0.7–4.0)
MCH: 29.6 pg (ref 26.0–34.0)
MCHC: 34 g/dL (ref 30.0–36.0)
MCV: 86.9 fL (ref 78.0–100.0)
MONOS PCT: 7 % (ref 3–12)
Monocytes Absolute: 0.4 10*3/uL (ref 0.1–1.0)
NEUTROS ABS: 3.2 10*3/uL (ref 1.7–7.7)
NEUTROS PCT: 52 % (ref 43–77)
PLATELETS: 263 10*3/uL (ref 150–400)
RBC: 4.43 MIL/uL (ref 3.87–5.11)
RDW: 14.2 % (ref 11.5–15.5)
WBC: 6.2 10*3/uL (ref 4.0–10.5)

## 2013-05-30 LAB — IRON: Iron: 74 ug/dL (ref 42–145)

## 2013-05-30 LAB — COMPREHENSIVE METABOLIC PANEL
ALBUMIN: 4.5 g/dL (ref 3.5–5.2)
ALT: 47 U/L — AB (ref 0–35)
AST: 29 U/L (ref 0–37)
Alkaline Phosphatase: 70 U/L (ref 39–117)
BILIRUBIN TOTAL: 0.5 mg/dL (ref 0.2–1.2)
BUN: 16 mg/dL (ref 6–23)
CHLORIDE: 102 meq/L (ref 96–112)
CO2: 30 meq/L (ref 19–32)
Calcium: 9.4 mg/dL (ref 8.4–10.5)
Creat: 0.64 mg/dL (ref 0.50–1.10)
GLUCOSE: 103 mg/dL — AB (ref 70–99)
POTASSIUM: 4 meq/L (ref 3.5–5.3)
Sodium: 136 mEq/L (ref 135–145)
TOTAL PROTEIN: 7.2 g/dL (ref 6.0–8.3)

## 2013-05-30 LAB — PROTIME-INR
INR: 0.97 (ref <1.50)
PROTHROMBIN TIME: 12.8 s (ref 11.6–15.2)

## 2013-05-30 LAB — HEPATITIS B CORE ANTIBODY, TOTAL: Hep B Core Total Ab: NONREACTIVE

## 2013-05-30 LAB — HEPATITIS B SURFACE ANTIBODY,QUALITATIVE: Hep B S Ab: NEGATIVE

## 2013-05-30 LAB — HEPATITIS B SURFACE ANTIGEN: Hepatitis B Surface Ag: NEGATIVE

## 2013-05-30 LAB — ANA: ANA: NEGATIVE

## 2013-05-30 LAB — HEPATITIS A ANTIBODY, TOTAL: HEP A TOTAL AB: BORDERLINE — AB

## 2013-05-30 LAB — HIV ANTIBODY (ROUTINE TESTING W REFLEX): HIV: NONREACTIVE

## 2013-05-31 LAB — HEPATITIS C GENOTYPE

## 2013-06-04 ENCOUNTER — Encounter: Payer: Self-pay | Admitting: Internal Medicine

## 2013-06-04 ENCOUNTER — Ambulatory Visit (INDEPENDENT_AMBULATORY_CARE_PROVIDER_SITE_OTHER): Payer: BC Managed Care – PPO | Admitting: Internal Medicine

## 2013-06-04 VITALS — BP 167/78 | HR 71 | Temp 97.7°F | Ht 61.0 in | Wt 167.0 lb

## 2013-06-04 DIAGNOSIS — Z23 Encounter for immunization: Secondary | ICD-10-CM

## 2013-06-04 DIAGNOSIS — B1920 Unspecified viral hepatitis C without hepatic coma: Secondary | ICD-10-CM

## 2013-06-04 NOTE — Addendum Note (Signed)
Addended by: Landis Gandy on: 06/04/2013 10:04 AM   Modules accepted: Orders
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
## 2013-06-04 NOTE — Patient Instructions (Signed)
Date 06/04/2013  Dear Ms Madison Collins, As discussed in the ID Clinic:          Madison Collins 90mg /400mg  tablet:           Take 1 tablet by mouth once daily   Please note that ALL MEDICATIONS WILL START ON THE SAME DATE for a total of 12 weeks. ---------------------------------------------------------------- Your HCV Treatment Start Date: TBA   Your HCV genotype:  1a    Liver Fibrosis:    TBA ---------------------------------------------------------------- YOUR PHARMACY CONTACT:   [facility] [place] Phone: [phone] Hours: Monday to Friday 7:30 am to 6:00 pm   Please always contact your pharmacy at least 3-4 business days before you run out of medications to ensure your next month's medication is ready or 1 week prior to running out if you receive it by mail.  Remember, each prescription is for 28 days. ---------------------------------------------------------------- GENERAL NOTES REGARDING YOUR HEPATITIS C MEDICATIONS: Madison Collins - Madison Collins is dosed twice daily approximately every 10-12 hrs with or without food. - Madison Collins can be taken with food to decrease GI upset.  - Madison Collins is contraindicated in pregnancy, and we recommend two effective forms of contraception while on HCV therapy, and for 6 months after completing therapy. - Common Side Effects with Madison Collins:      1. Anemia      2. Nausea      3. Dry Cough      4. Rash      5. Insomnia      6. Photosensitivity   SOFOSBUVIR/LEDIPASVIR (Madison Collins): - Madison Collins tablet is taken daily with OR without food. - The tablets are orange. - The tablets should be stored at room temperature.  - Acid reducing agents such as H2 blockers (ie. Madison Collins (famotidine), Zantac (ranitidine), Tagamet (cimetidine), Axid (nizatidine) and proton pump inhibitors (ie. Prilosec (omeprazole), Protonix (pantoprazole), Nexium (esomeprazole), or Aciphex (rabeprazole)) can decrease  effectiveness of Madison Collins. Do not take until you have discussed with a health care provider.    -Antacids that contain magnesium and/or aluminum hydroxide (ie. Milk of Magensia, Rolaids, Gaviscon, Maalox, Mylanta, an dArthritis Pain Formula)can reduce absorption of Madison Collins, so take them at least 4 hours before or after Madison Collins.  -Calcium carbonate (calcium supplements or antacids such as Tums, Caltrate, Os-Cal)needs to be taken at least 4 hours hours before or after Madison Collins.  -St. John's wort or any products that contain St. John's wort like some herbal supplements  Please inform the office prior to starting any of these medications.  - The common side effects with Madison Collins:      1. Fatigue      2. Headache      3. Nausea      4. Diarrhea      5. Insomnia   Support Path is a suite of resources designed to help patients start with Madison Collins and move toward treatment completion Madison Collins helps patients access therapy and get off to an efficient start  Benefits investigation and prior authorization support Co-pay and other financial assistance A specialty pharmacy finder CO-PAY COUPON The [facility] co-pay coupon may help eligible patients lower their out-of-pocket costs. With a co-pay coupon, most eligible patients may pay no more than $5 per co-pay (restrictions apply) www.Madison Collins.com call [phone] Not valid for patients enrolled in government healthcare prescription drug programs, such as Medicare Part D and Medicaid. Patients in the coverage gap known as the "donut hole" also are not eligible The  Madison Collins co-pay coupon program will cover the out-of-pocket costs for Madison Collins prescriptions up to a maximum of 25% of the catalog price of a 12-week regimen of Madison Collins  Please note that this only lists the most common side effects and is NOT a comprehensive list of the potential side effects of these medications. For more information, please review the drug information  sheets that come with your medication package from the pharmacy.  ---------------------------------------------------------------- GENERAL HELPFUL HINTS ON HCV THERAPY: 1. No alcohol. 2. Protect against sun-sensitivity/sunburns (wear sunglasses, hat, long sleeves, pants and sunscreen). 3. Stay well-hydrated/well-moisturized. 4. Notify the ID Clinic of any changes in your other over-the-counter/herbal or prescription medications. 5. If you miss a dose of your medication, take the missed dose as soon as you remember. Return to your regular time/dose schedule the next day.  6.  Do not stop taking your medications without first talking with your healthcare provider. 7.  You may take Tylenol (acetaminophen), as long as the dose is less than 2000 mg (OR no more than 4 tablets of the Tylenol Extra Strengths 500mg  tablet) in 24 hours. 8.  You will need to obtain routine labs and/or office visits at [facility] at weeks 2, 4, 8,  and 12 as well as 12 and 24 weeks after completion of treatment.  If you are not compliant with labs and office visits, we may discontinue HCV therapy.  Thayer Headings, [place] [facility] [place] [address], [place]  [address] [phone]

## 2013-06-12 ENCOUNTER — Telehealth: Payer: Self-pay | Admitting: *Deleted

## 2013-06-12 NOTE — Telephone Encounter (Signed)
Left message that patient has been scheduled for her elastography.  Appointment is Friday, 4/24 at 9:30 (arrive [place]).  Patient should have nothing to eat or drink after midnight before her procedure.  Asked the patient to call us back at [phone] to confirm receipt of this message.  If she needs to reschedule, she is to call [phone]. Landis Gandy, RN

## 2013-06-14 ENCOUNTER — Ambulatory Visit (HOSPITAL_COMMUNITY)
Admission: RE | Admit: 2013-06-14 | Discharge: 2013-06-14 | Disposition: A | Discharge status: Home / Self Care | Payer: BC Managed Care – PPO | Source: Ambulatory Visit | Attending: Internal Medicine | Admitting: Internal Medicine

## 2013-06-14 DIAGNOSIS — B1920 Unspecified viral hepatitis C without hepatic coma: Secondary | ICD-10-CM | POA: Insufficient documentation

## 2013-06-15 ENCOUNTER — Ambulatory Visit (HOSPITAL_COMMUNITY): Payer: BC Managed Care – PPO

## 2013-06-15 ENCOUNTER — Other Ambulatory Visit: Payer: Self-pay | Admitting: Internal Medicine

## 2013-06-15 MED ORDER — LEDIPASVIR-SOFOSBUVIR 90-400 MG PO TABS: 1.0000 | ORAL_TABLET | Freq: Every day | ORAL | Status: DC

## 2013-06-20 ENCOUNTER — Telehealth: Payer: Self-pay | Admitting: *Deleted

## 2013-06-20 NOTE — Telephone Encounter (Signed)
Right now, Harvoni has been prescribed and waiting to hear on approval. Her liver scan was good with no signs of any liver damage.  As soon as we hear about the medication, will let her know.  thanks

## 2013-06-20 NOTE — Telephone Encounter (Signed)
Patient called advised she has had her Liver scan and wants the results. She also wants to know the next step in her treatment. Advised her will send the doctor a message to see what he wants her to do and if he is going to prescribe and that I will call her back asap.

## 2013-06-21 NOTE — Telephone Encounter (Signed)
Called the patient back to advise her of Dr Linus Salmons response and had to leave a message for her to call the office. No name on the voicemail.

## 2013-07-02 ENCOUNTER — Telehealth: Payer: Self-pay

## 2013-07-02 ENCOUNTER — Ambulatory Visit: Payer: BC Managed Care – PPO | Admitting: Internal Medicine

## 2013-07-02 NOTE — Telephone Encounter (Signed)
PATIENT CALLED STATING DR DOOLITTLE NORMALLY WRITES A PRESCRIPTION FOR [place] 3 AT A TIME. SHE SAYS WHEN SHE WENT TO PICK UP THE THIRD, THEY PHARMACY HAD 130 MG OR 130 PILLS INSTEAD OF 120 MG OR 120 PILLS.  NOT SURE IF SHE WAS REFERRING TO MG OR # OF PILLS AND SHE BECAME UPSET WHEN ASKED TO CLARIFY!!!!

## 2013-07-02 NOTE — Telephone Encounter (Signed)
Dr. Laney Pastor, it looks like the third rx is for 30 tab instead of 120 tab. Was this correct or does it need to be re written?

## 2013-07-03 ENCOUNTER — Ambulatory Visit (INDEPENDENT_AMBULATORY_CARE_PROVIDER_SITE_OTHER): Payer: BC Managed Care – PPO | Admitting: Internal Medicine

## 2013-07-03 ENCOUNTER — Encounter: Payer: Self-pay | Admitting: Internal Medicine

## 2013-07-03 VITALS — BP 149/77 | HR 75 | Temp 98.1°F | Ht 61.0 in | Wt 169.0 lb

## 2013-07-03 DIAGNOSIS — Z23 Encounter for immunization: Secondary | ICD-10-CM

## 2013-07-03 DIAGNOSIS — B1920 Unspecified viral hepatitis C without hepatic coma: Secondary | ICD-10-CM

## 2013-07-03 NOTE — Progress Notes (Signed)
   Subjective:    Patient ID: Madison Collins, female    DOB: May 23, 1950, 63 y.o.   MRN: [id number]  HPI Here for follow up of hepatitis C.  No previous treatment.  Genotype 1a.   Getting hepatitis B vaccine series.  Had elastography and is F0.  Still waiting for approval for Harvoni.  No new complaints.  Still with fatigue since her breast abscess last year.     Review of Systems  Constitutional: Negative for fatigue.  Gastrointestinal: Negative for nausea and diarrhea.  Neurological: Negative for dizziness.       Objective:   Physical Exam  Constitutional: She appears well-developed and well-nourished. No distress.  HENT:  Mouth/Throat: No oropharyngeal exudate.  Eyes: No scleral icterus.  Psychiatric: She has a normal mood and affect.          Assessment & Plan:

## 2013-07-03 NOTE — Telephone Encounter (Signed)
i'll rewrite tomorrow

## 2013-07-03 NOTE — Assessment & Plan Note (Signed)
Has F0 on elastography.  Still waiting to see if Harvoni approved but very unlikely at this time.  I will have her return in 1 year to try again.   I doubt fatigue related to hepatitis C with no liver issues and that it is relatively new.   Hep B #2 today and return in 5 months for #3.

## 2013-07-03 NOTE — Addendum Note (Signed)
Addended by: Myrtis Hopping A on: 07/03/2013 09:50 AM   Modules accepted: Orders

## 2013-07-04 MED ORDER — HYDROCODONE-ACETAMINOPHEN 10-325 MG PO TABS: 1.0000 | ORAL_TABLET | Freq: Four times a day (QID) | ORAL | Status: DC | PRN

## 2013-07-04 NOTE — Telephone Encounter (Signed)
LMOM that rx is up front for p/u 

## 2013-07-04 NOTE — Telephone Encounter (Signed)
LMOM that Dr. Laney Pastor will re write rx tomorrow.

## 2013-07-04 NOTE — Addendum Note (Signed)
Addended by: Leandrew Koyanagi on: 07/04/2013 04:15 PM   Modules accepted: Orders

## 2013-07-04 NOTE — Telephone Encounter (Signed)
done

## 2013-08-06 ENCOUNTER — Telehealth: Payer: Self-pay

## 2013-08-06 MED ORDER — HYDROCODONE-ACETAMINOPHEN 10-325 MG PO TABS: 1.0000 | ORAL_TABLET | Freq: Four times a day (QID) | ORAL | Status: DC | PRN

## 2013-08-06 NOTE — Telephone Encounter (Signed)
Needs refill on [facility]; has appt scheduled on the 1st

## 2013-08-07 ENCOUNTER — Telehealth: Payer: Self-pay | Admitting: Licensed Clinical Social Worker

## 2013-08-07 NOTE — Telephone Encounter (Signed)
Left patient a message for patient to come in and sign patient assistance form for Harvoni bc insurance rejected covered. Patient also needs to answer income questions on page 2. Forms on the desk in Triage.

## 2013-08-07 NOTE — Telephone Encounter (Signed)
Notified pt on VM that Rx is ready.

## 2013-08-08 ENCOUNTER — Telehealth: Payer: Self-pay | Admitting: *Deleted

## 2013-08-08 NOTE — Telephone Encounter (Signed)
Patient called and was advised she needs to sign paperwork for [facility]. She advised she will come by 08/09/13 to do so.

## 2013-08-15 ENCOUNTER — Ambulatory Visit: Payer: BC Managed Care – PPO | Admitting: Internal Medicine

## 2013-08-22 ENCOUNTER — Encounter: Payer: Self-pay | Admitting: Internal Medicine

## 2013-08-22 ENCOUNTER — Ambulatory Visit (INDEPENDENT_AMBULATORY_CARE_PROVIDER_SITE_OTHER): Payer: BC Managed Care – PPO | Admitting: Internal Medicine

## 2013-08-22 VITALS — BP 153/77 | HR 70 | Temp 97.9°F | Resp 16 | Ht 61.0 in | Wt 168.0 lb

## 2013-08-22 DIAGNOSIS — M1711 Unilateral primary osteoarthritis, right knee: Secondary | ICD-10-CM

## 2013-08-22 DIAGNOSIS — G4700 Insomnia, unspecified: Secondary | ICD-10-CM

## 2013-08-22 DIAGNOSIS — Z1211 Encounter for screening for malignant neoplasm of colon: Secondary | ICD-10-CM

## 2013-08-22 DIAGNOSIS — M1710 Unilateral primary osteoarthritis, unspecified knee: Secondary | ICD-10-CM

## 2013-08-22 DIAGNOSIS — IMO0002 Reserved for concepts with insufficient information to code with codable children: Secondary | ICD-10-CM

## 2013-08-22 DIAGNOSIS — Z23 Encounter for immunization: Secondary | ICD-10-CM

## 2013-08-22 MED ORDER — HYDROCODONE-ACETAMINOPHEN 10-325 MG PO TABS: 1.0000 | ORAL_TABLET | Freq: Four times a day (QID) | ORAL | Status: DC | PRN

## 2013-08-22 MED ORDER — ZOSTER VACCINE LIVE 19400 UNT/0.65ML ~~LOC~~ SOLR: 0.6500 mL | Freq: Once | SUBCUTANEOUS | Status: DC

## 2013-08-22 MED ORDER — TEMAZEPAM 30 MG PO CAPS: 30.0000 mg | ORAL_CAPSULE | Freq: Every evening | ORAL | Status: DC | PRN

## 2013-08-22 NOTE — Progress Notes (Signed)
Subjective:    Patient ID: Madison Collins, female    DOB: March 24, 1950, 63 y.o.   MRN: [id number]  HPI  Chief Complaint  Patient presents with  . Hypertension  . Follow-up hepc--ID says to good to qualify for rx now      . Insomnia---fam stress still w/ daughter///responds best to temaz    -   Knee pain increasing --on [facility] prn/// known acl deficit knee   -  Depr/anx stable on meds  Prior to Admission medications   Medication Sig Start Date End Date Taking? Authorizing Provider  ALPRAZolam Duanne Moron) 0.5 MG tablet Take 1 tablet (0.5 mg total) by mouth 3 (three) times daily as needed for anxiety. 05/02/13  Yes Leandrew Koyanagi, MD  citalopram (CELEXA) 40 MG tablet Take 1 tablet (40 mg total) by mouth every morning. 05/02/13  Yes Leandrew Koyanagi, MD  fluocinonide-emollient (LIDEX-E) 0.05 % cream Apply topically 2 (two) times daily. Foot dermatitis 11/08/12  Yes Leandrew Koyanagi, MD  hydrochlorothiazide (MICROZIDE) 12.5 MG capsule Take 1 capsule (12.5 mg total) by mouth every morning. 05/02/13  Yes Leandrew Koyanagi, MD  HYDROcodone-acetaminophen [facility]) 10-325 MG per tablet Take 1 tablet by mouth every 6 (six) hours as needed. 05/02/13  Yes Leandrew Koyanagi, MD  HYDROcodone-acetaminophen [facility]) 10-325 MG per tablet Take 1 tablet by mouth every 6 (six) hours as needed. 08/06/13  Yes Leandrew Koyanagi, MD  loratadine (CLARITIN) 10 MG tablet Take 10 mg by mouth daily as needed for allergies.    Yes Historical Provider, MD  meloxicam (MOBIC) 15 MG tablet Take 1 tablet (15 mg total) by mouth every morning. 05/02/13  Yes Leandrew Koyanagi, MD  Multiple Vitamins-Minerals (MULTI-VITAMIN GUMMIES) CHEW Chew 1 tablet by mouth every morning.    Yes Historical Provider, MD  omeprazole (PRILOSEC) 20 MG capsule Take 1 capsule (20 mg total) by mouth every morning. 05/02/13  Yes Leandrew Koyanagi, MD  SUMAtriptan (IMITREX) 100 MG tablet Take 1 tablet (100 mg total) by mouth every 2 (two) hours as needed  for migraine or headache. May repeat in 2 hours if headache persists or recurs. 05/02/13  Yes Leandrew Koyanagi, MD  Ledipasvir-Sofosbuvir (HARVONI) 90-400 MG TABS Take 1 tablet by mouth daily. 06/15/13 Not taking  Thayer Headings, MD    Needs colonos   Review of Systems  Constitutional: Negative for fever, activity change, appetite change and unexpected weight change.  HENT: Negative for trouble swallowing.   Eyes: Negative for visual disturbance.  Respiratory: Negative for shortness of breath.   Cardiovascular: Negative for chest pain, palpitations and leg swelling.  Gastrointestinal: Negative for abdominal pain.  Genitourinary: Negative for frequency and difficulty urinating.  Skin: Negative for rash.  Neurological:       HAs doing well       Objective:   Physical Exam BP 153/77  Pulse 70  Temp(Src) 97.9 F (36.6 C) (Oral)  Resp 16  Ht 5\' 1"  (1.549 m)  Wt 168 lb (76.204 kg)  BMI 31.76 kg/m2  SpO2 97% HEENT clear Heart regular R Knee is puffy/swollen slightly with painful range of motion Neurological intact Mood good affect appropriate Thought content normal       Assessment & Plan:  Insomnia - Plan: temazepam (RESTORIL) 30 MG capsule  Special screening for malignant neoplasms, colon - Plan: Ambulatory referral to Gastroenterology  Osteoarthritis of right knee, unspecified osteoarthritis type - Plan: Ambulatory referral to Orthopedic Surgery  Need for shingles vaccine -  Plan: zoster vaccine live, PF, (ZOSTAVAX) 49201 UNT/0.65ML injection  Meds ordered this encounter  Medications  . temazepam (RESTORIL) 30 MG capsule    Sig: Take 1 capsule (30 mg total) by mouth at bedtime as needed for sleep.    Dispense:  30 capsule    Refill:  5  . HYDROcodone-acetaminophen (NORCO) 10-325 MG per tablet    Sig: Take 1 tablet by mouth every 6 (six) hours as needed.    Dispense:  120 tablet    Refill:  0  . HYDROcodone-acetaminophen (NORCO) 10-325 MG per tablet    Sig:  Take 1 tablet by mouth every 6 (six) hours as needed. For 30d after signing    Dispense:  120 tablet    Refill:  0  . HYDROcodone-acetaminophen (NORCO) 10-325 MG per tablet    Sig: Take 1 tablet by mouth every 6 (six) hours as needed. For 60d after signing    Dispense:  120 tablet    Refill:  0  . zoster vaccine live, PF, (ZOSTAVAX) 00712 UNT/0.65ML injection    Sig: Inject 19,400 Units into the skin once.    Dispense:  1 each    Refill:  0   96mos

## 2013-08-29 ENCOUNTER — Encounter: Payer: Self-pay | Admitting: Internal Medicine

## 2013-08-31 ENCOUNTER — Telehealth: Payer: Self-pay | Admitting: *Deleted

## 2013-08-31 NOTE — Telephone Encounter (Signed)
[facility] called to let us know that pt has been refilling Imitrex more often than in the past. She is also on Celexa and they are concerned about serotonin syndrome. Pt reported that she has been taking about 9 Imitrex every two weeks.  [phone] Kennyth Lose would like call back about plan and we are aware of the interaction.

## 2013-09-01 NOTE — Telephone Encounter (Signed)
She's been on both for many years/ Not aware she's using more recently and she was just in Let the pharmacist know the above and I'll call the patient on Tuesday

## 2013-09-03 NOTE — Telephone Encounter (Signed)
Spoke to Madison Collins advised on the information. They will fill medication refills.

## 2013-10-08 ENCOUNTER — Telehealth: Payer: Self-pay | Admitting: *Deleted

## 2013-10-08 ENCOUNTER — Ambulatory Visit (AMBULATORY_SURGERY_CENTER): Payer: Self-pay | Admitting: *Deleted

## 2013-10-08 VITALS — Ht 61.0 in | Wt 167.2 lb

## 2013-10-08 DIAGNOSIS — Z1211 Encounter for screening for malignant neoplasm of colon: Secondary | ICD-10-CM

## 2013-10-08 MED ORDER — MOVIPREP 100 G PO SOLR: ORAL | Status: DC

## 2013-10-08 NOTE — Progress Notes (Signed)
No allergies to eggs or soy. No problems with anesthesia.  Pt given Emmi instructions for colonoscopy  No oxygen use  No diet drug use  

## 2013-10-08 NOTE — Telephone Encounter (Signed)
Faxed Harvoni papwork to [facility] for review.  Placed originals in Dr. Henreitta Leber office box.

## 2013-10-11 ENCOUNTER — Encounter: Payer: Self-pay | Admitting: Internal Medicine

## 2013-10-22 ENCOUNTER — Encounter: Payer: Self-pay | Admitting: Internal Medicine

## 2013-10-22 ENCOUNTER — Ambulatory Visit (AMBULATORY_SURGERY_CENTER): Payer: BC Managed Care – PPO | Admitting: Internal Medicine

## 2013-10-22 VITALS — BP 145/70 | HR 69 | Temp 97.7°F | Resp 22 | Ht 61.0 in | Wt 167.0 lb

## 2013-10-22 DIAGNOSIS — D125 Benign neoplasm of sigmoid colon: Secondary | ICD-10-CM

## 2013-10-22 DIAGNOSIS — D122 Benign neoplasm of ascending colon: Secondary | ICD-10-CM

## 2013-10-22 DIAGNOSIS — Z1211 Encounter for screening for malignant neoplasm of colon: Secondary | ICD-10-CM

## 2013-10-22 DIAGNOSIS — D126 Benign neoplasm of colon, unspecified: Secondary | ICD-10-CM

## 2013-10-22 HISTORY — PX: COLONOSCOPY: SHX174

## 2013-10-22 MED ORDER — SODIUM CHLORIDE 0.9 % IV SOLN: 500.0000 mL | INTRAVENOUS | Status: DC

## 2013-10-22 NOTE — Patient Instructions (Signed)
Colon polyps x 2 removed today. Handout given. Resume current medications. Call with any questions or concerns. Thank you!   YOU HAD AN ENDOSCOPIC PROCEDURE TODAY AT [facility]: Refer to the procedure report that was given to you for any specific questions about what was found during the examination.  If the procedure report does not answer your questions, please call your gastroenterologist to clarify.  If you requested that your care partner not be given the details of your procedure findings, then the procedure report has been included in a sealed envelope for you to review at your convenience later.  YOU SHOULD EXPECT: Some feelings of bloating in the abdomen. Passage of more gas than usual.  Walking can help get rid of the air that was put into your GI tract during the procedure and reduce the bloating. If you had a lower endoscopy (such as a colonoscopy or flexible sigmoidoscopy) you may notice spotting of blood in your stool or on the toilet paper. If you underwent a bowel prep for your procedure, then you may not have a normal bowel movement for a few days.  DIET: Your first meal following the procedure should be a light meal and then it is ok to progress to your normal diet.  A half-sandwich or bowl of soup is an example of a good first meal.  Heavy or fried foods are harder to digest and may make you feel nauseous or bloated.  Likewise meals heavy in dairy and vegetables can cause extra gas to form and this can also increase the bloating.  Drink plenty of fluids but you should avoid alcoholic beverages for 24 hours.  ACTIVITY: Your care partner should take you home directly after the procedure.  You should plan to take it easy, moving slowly for the rest of the day.  You can resume normal activity the day after the procedure however you should NOT DRIVE or use heavy machinery for 24 hours (because of the sedation medicines used during the test).    SYMPTOMS TO REPORT  IMMEDIATELY: A gastroenterologist can be reached at any hour.  During normal business hours, 8:30 AM to 5:00 PM Monday through Friday, call [phone].  After hours and on weekends, please call the GI answering service at [phone] who will take a message and have the physician on call contact you.   Following lower endoscopy (colonoscopy or flexible sigmoidoscopy):  Excessive amounts of blood in the stool  Significant tenderness or worsening of abdominal pains  Swelling of the abdomen that is new, acute  Fever of 100F or higher  Following upper endoscopy (EGD)  Vomiting of blood or coffee ground material  New chest pain or pain under the shoulder blades  Painful or persistently difficult swallowing  New shortness of breath  Fever of 100F or higher  Black, tarry-looking stools  FOLLOW UP: If any biopsies were taken you will be contacted by phone or by letter within the next 1-3 weeks.  Call your gastroenterologist if you have not heard about the biopsies in 3 weeks.  Our staff will call the home number listed on your records the next business day following your procedure to check on you and address any questions or concerns that you may have at that time regarding the information given to you following your procedure. This is a courtesy call and so if there is no answer at the home number and we have not heard from you through the emergency physician on  call, we will assume that you have returned to your regular daily activities without incident.  SIGNATURES/CONFIDENTIALITY: You and/or your care partner have signed paperwork which will be entered into your electronic medical record.  These signatures attest to the fact that that the information above on your After Visit Summary has been reviewed and is understood.  Full responsibility of the confidentiality of this discharge information lies with you and/or your care-partner.

## 2013-10-22 NOTE — Progress Notes (Signed)
A/ox3 pleased with MAC, report to Robbin RN 

## 2013-10-22 NOTE — Op Note (Signed)
[place]  [address]. [place], [address]   COLONOSCOPY PROCEDURE REPORT  PATIENT: Madison Collins, Madison Collins  MR#: [id number] BIRTHDATE: 1950/08/05 , 62  yrs. old GENDER: Female ENDOSCOPIST: Jerene Bears, MD REFERRED IW:[id number] Laney Pastor, M.D. PROCEDURE DATE:  10/22/2013 PROCEDURE:   Colonoscopy with snare polypectomy First Screening Colonoscopy - Avg.  risk and is 50 yrs.  old or older - No.  Prior Negative Screening - Now for repeat screening. N/A  History of Adenoma - Now for follow-up colonoscopy & has been > or = to 3 yrs.  N/A  Polyps Removed Today? Yes. ASA CLASS:   Class III INDICATIONS:average risk screening and first colonoscopy. MEDICATIONS: MAC sedation, administered by CRNA and propofol (Diprivan) 240mg  IV  DESCRIPTION OF PROCEDURE:   After the risks benefits and alternatives of the procedure were thoroughly explained, informed consent was obtained.  A digital rectal exam revealed no rectal mass.   The LB [id number]  endoscope was introduced through the anus and advanced to the terminal ileum which was intubated for a short distance. No adverse events experienced.   The quality of the prep was good, using MoviPrep  The instrument was then slowly withdrawn as the colon was fully examined.   COLON FINDINGS: The mucosa appeared normal in the terminal ileum. Two sessile polyps measuring 5 mm in size were found in the ascending colon and sigmoid colon.  Polypectomy was performed using cold snare.  All resections were complete and all polyp tissue was completely retrieved.   The colon mucosa was otherwise normal. Retroflexed views revealed no abnormalities. The time to cecum=4 minutes 01 seconds.  Withdrawal time=11 minutes 24 seconds.  The scope was withdrawn and the procedure completed. COMPLICATIONS: There were no complications.  ENDOSCOPIC IMPRESSION: 1.   Normal mucosa in the terminal ileum 2.   Two sessile polyps measuring 5 mm in size were  found in the ascending colon and sigmoid colon; Polypectomy was performed using cold snare 3.   The colon mucosa was otherwise normal  RECOMMENDATIONS: 1.  Await pathology results 2.  If the polyps removed today are proven to be adenomatous (pre-cancerous) polyps, you will need a repeat colonoscopy in 5 years.  Otherwise you should continue to follow colorectal cancer screening guidelines for "routine risk" patients with colonoscopy in 10 years.  You will receive a letter within 1-2 weeks with the results of your biopsy as well as final recommendations.  Please call my office if you have not received a letter after 3 weeks.   eSigned:  Jerene Bears, MD 10/22/2013 9:13 AM cc: The Patient and Tami Lin, MD

## 2013-10-22 NOTE — Progress Notes (Signed)
Called to room to assist during endoscopic procedure.  Patient ID and intended procedure confirmed with present staff. Received instructions for my participation in the procedure from the performing physician.  

## 2013-10-23 ENCOUNTER — Telehealth: Payer: Self-pay | Admitting: *Deleted

## 2013-10-23 NOTE — Telephone Encounter (Signed)
  Follow up Call-  Call back number 10/22/2013  Post procedure Call Back phone  # [phone]  Permission to leave phone message Yes     Patient questions:  Do you have a fever, pain , or abdominal swelling? No. Pain Score  0 *  Have you tolerated food without any problems? Yes.    Have you been able to return to your normal activities? Yes.    Do you have any questions about your discharge instructions: Diet   No. Medications  No. Follow up visit  No.  Do you have questions or concerns about your Care? No.  Actions: * If pain score is 4 or above: No action needed, pain <4.

## 2013-10-25 ENCOUNTER — Encounter: Payer: Self-pay | Admitting: Internal Medicine

## 2013-11-05 ENCOUNTER — Telehealth: Payer: Self-pay

## 2013-11-05 NOTE — Telephone Encounter (Signed)
Done

## 2013-11-05 NOTE — Telephone Encounter (Signed)
Pt got a flu shot today at [facility] and would like that documented in her chart.

## 2013-11-11 ENCOUNTER — Telehealth: Payer: Self-pay

## 2013-11-11 NOTE — Telephone Encounter (Signed)
Xanax refill 

## 2013-11-13 NOTE — Telephone Encounter (Signed)
Patient would like a call back on her request for xanax refill.    [phone]

## 2013-11-14 MED ORDER — ALPRAZOLAM 0.5 MG PO TABS: 0.5000 mg | ORAL_TABLET | Freq: Three times a day (TID) | ORAL | Status: DC | PRN

## 2013-11-14 NOTE — Telephone Encounter (Signed)
Prescription faxed to the pharmacy. Pt notified.

## 2013-11-15 ENCOUNTER — Other Ambulatory Visit: Payer: Self-pay

## 2013-11-15 ENCOUNTER — Other Ambulatory Visit: Payer: Self-pay | Admitting: Orthopedic Surgery

## 2013-11-15 DIAGNOSIS — Z1231 Encounter for screening mammogram for malignant neoplasm of breast: Secondary | ICD-10-CM

## 2013-11-29 ENCOUNTER — Telehealth: Payer: Self-pay

## 2013-11-29 NOTE — Telephone Encounter (Signed)
Patient requesting a refill on her "Norco". Please call patient when ready to be picked up at [phone]

## 2013-11-30 MED ORDER — HYDROCODONE-ACETAMINOPHEN 10-325 MG PO TABS: 1.0000 | ORAL_TABLET | Freq: Four times a day (QID) | ORAL | Status: DC | PRN

## 2013-11-30 NOTE — Telephone Encounter (Signed)
Please call pt- script in pick up drawer.

## 2013-11-30 NOTE — Telephone Encounter (Signed)
Spoke to patient and advised her that her RX was ready for pickup.

## 2013-12-12 ENCOUNTER — Encounter (HOSPITAL_COMMUNITY)
Admission: RE | Admit: 2013-12-12 | Discharge: 2013-12-12 | Disposition: A | Discharge status: Home / Self Care | Payer: BC Managed Care – PPO | Source: Ambulatory Visit | Attending: Orthopedic Surgery | Admitting: Orthopedic Surgery

## 2013-12-12 ENCOUNTER — Encounter (HOSPITAL_COMMUNITY): Payer: Self-pay

## 2013-12-12 DIAGNOSIS — Z01818 Encounter for other preprocedural examination: Secondary | ICD-10-CM | POA: Diagnosis not present

## 2013-12-12 DIAGNOSIS — M13861 Other specified arthritis, right knee: Secondary | ICD-10-CM | POA: Insufficient documentation

## 2013-12-12 HISTORY — DX: Headache, unspecified: R51.9

## 2013-12-12 HISTORY — DX: Headache: R51

## 2013-12-12 LAB — CBC WITH DIFFERENTIAL/PLATELET
Basophils Absolute: 0 K/uL (ref 0.0–0.1)
Basophils Relative: 0 % (ref 0–1)
Eosinophils Absolute: 0.2 K/uL (ref 0.0–0.7)
Eosinophils Relative: 2 % (ref 0–5)
HCT: 40.2 % (ref 36.0–46.0)
Hemoglobin: 13.5 g/dL (ref 12.0–15.0)
Lymphocytes Relative: 37 % (ref 12–46)
Lymphs Abs: 2.4 K/uL (ref 0.7–4.0)
MCH: 30.1 pg (ref 26.0–34.0)
MCHC: 33.6 g/dL (ref 30.0–36.0)
MCV: 89.7 fL (ref 78.0–100.0)
Monocytes Absolute: 0.6 K/uL (ref 0.1–1.0)
Monocytes Relative: 9 % (ref 3–12)
Neutro Abs: 3.4 K/uL (ref 1.7–7.7)
Neutrophils Relative %: 52 % (ref 43–77)
Platelets: 256 K/uL (ref 150–400)
RBC: 4.48 MIL/uL (ref 3.87–5.11)
RDW: 12.6 % (ref 11.5–15.5)
WBC: 6.6 K/uL (ref 4.0–10.5)

## 2013-12-12 LAB — COMPREHENSIVE METABOLIC PANEL WITH GFR
ALT: 38 U/L — ABNORMAL HIGH (ref 0–35)
AST: 27 U/L (ref 0–37)
Albumin: 3.7 g/dL (ref 3.5–5.2)
Alkaline Phosphatase: 81 U/L (ref 39–117)
Anion gap: 12 (ref 5–15)
BUN: 15 mg/dL (ref 6–23)
CO2: 26 meq/L (ref 19–32)
Calcium: 9.3 mg/dL (ref 8.4–10.5)
Chloride: 103 meq/L (ref 96–112)
Creatinine, Ser: 0.66 mg/dL (ref 0.50–1.10)
GFR calc Af Amer: >90 mL/min
GFR calc non Af Amer: >90 mL/min
Glucose, Bld: 106 mg/dL — ABNORMAL HIGH (ref 70–99)
Potassium: 4.2 meq/L (ref 3.7–5.3)
Sodium: 141 meq/L (ref 137–147)
Total Bilirubin: 0.3 mg/dL (ref 0.3–1.2)
Total Protein: 7.4 g/dL (ref 6.0–8.3)

## 2013-12-12 LAB — TYPE AND SCREEN
ABO/RH(D): A POS
Antibody Screen: NEGATIVE

## 2013-12-12 LAB — PROTIME-INR
INR: 0.94 (ref 0.00–1.49)
Prothrombin Time: 12.6 s (ref 11.6–15.2)

## 2013-12-12 LAB — SURGICAL PCR SCREEN
MRSA, PCR: NEGATIVE
STAPHYLOCOCCUS AUREUS: NEGATIVE

## 2013-12-12 LAB — APTT: aPTT: 31 seconds (ref 24–37)

## 2013-12-12 LAB — ABO/RH: ABO/RH(D): A POS

## 2013-12-12 NOTE — Progress Notes (Signed)
Pt. Went to the joint class and watched Emmi video at the class. I also gave her  Log in code to watch the video again at home per her request.

## 2013-12-12 NOTE — Pre-Procedure Instructions (Signed)
Madison Collins  12/12/2013   Your procedure is scheduled on:  Monday, Nov. 2  Report to [facility] Main Entrance "A" at 5:30AM.  Call this number if you have problems the morning of surgery: [phone]   Remember:   Do not eat food or drink liquids after midnight.   Take these medicines the morning of surgery with A SIP OF WATER: xanax, celexa, hydrocodone-acetaminophen (norco), or percocet,  claritin,  prilosec (omeprazole), imitrex if needed   Do not wear jewelry, make-up or nail polish.  Do not wear lotions, powders, or perfumes. You may wear deodorant.  Do not shave 48 hours prior to surgery. Men may shave face and neck.  Do not bring valuables to the hospital.  [facility] is not responsible  for any belongings or valuables.               Contacts, dentures or bridgework may not be worn into surgery.  Leave suitcase in the car. After surgery it may be brought to your room.  For patients admitted to the hospital, discharge time is determined by your                treatment team.               Patients discharged the day of surgery will not be allowed to drive  home.  Name and phone number of your driver:   Special Instructions:  Review preparing for surgery handout   Please read over the following fact sheets that you were given: Pain Booklet, Coughing and Deep Breathing, Blood Transfusion Information, MRSA Information and Surgical Site Infection Prevention

## 2013-12-15 LAB — URINE CULTURE

## 2013-12-20 NOTE — H&P (Signed)
TOTAL KNEE ADMISSION H&P  Patient is being admitted for right total knee arthroplasty.  Subjective:  Chief Complaint:right knee pain.  HPI: Madison Collins, 63 y.o. female, has a history of pain and functional disability in the right knee due to arthritis and has failed non-surgical conservative treatments for greater than 12 weeks to includeNSAID's and/or analgesics, corticosteriod injections, weight reduction as appropriate and activity modification.  Onset of symptoms was gradual, starting 10 years ago with gradually worsening course since that time. The patient noted prior procedures on the knee to include  arthroscopy on the right knee(s).  Patient currently rates pain in the right knee(s) at 10 out of 10 with activity. Patient has night pain, worsening of pain with activity and weight bearing, pain that interferes with activities of daily living, pain with passive range of motion and crepitus.  Patient has evidence of periarticular osteophytes and joint space narrowing by imaging studies.  There is no active infection.  Patient Active Problem List   Diagnosis Date Noted  . Foot dermatitis 11/08/2012  . BMI 29.0-29.9,adult 11/18/2011  . Hepatitis C virus infection without hepatic coma 05/19/2011  . Hypertension 05/19/2011  . History of migraine headaches 05/19/2011  . Osteoarthritis 05/19/2011  . Insomnia 05/19/2011  . Chronic knee pain 05/19/2011  . Depression with anxiety 05/19/2011  . Cervical disc syndrome 05/19/2011  . Gastroesophageal reflux 05/19/2011   Past Medical History  Diagnosis Date  . Hepatitis C     IA viral load low 02/09    BX past mild fibrosis  . Hypertension   . Insomnia, unspecified   . Knee pain   . Anxiety and depression   . Bulging disc 1990's    c4-5  . Post-menopause 2010  . GERD (gastroesophageal reflux disease)   . H/O: osteoarthritis   . Arthritis   . Depression   . Cataract     right eye  . Anxiety   . Headache     migraines  occasionally    Past Surgical History  Procedure Laterality Date  . Tubal ligation  1987  . Knee surgery Right      x4  . Eye surgery Right 2010    hole in macula  . Ovarian cyst removal    . Bunionectomy Right 1983  . Colonoscopy  10/22/13    No prescriptions prior to admission   Allergies  Allergen Reactions  . Latex Other (See Comments)    "Latex tape used during surgery - breaks my skin out"  . Adhesive [Tape] Rash    "per pt - surgical tape, also causes itching    History  Substance Use Topics  . Smoking status: Never Smoker   . Smokeless tobacco: Never Used  . Alcohol Use: Yes     Comment: a glass of wine/rarely    Family History  Problem Relation Age of Onset  . Cancer Mother     Lung  . Diabetes Mother   . Stroke Father   . Diabetes Father   . Colon cancer Father 68    anal cancer  . Hypertension Brother   . Bipolar disorder Daughter   . Bipolar disorder Son   . Diabetes Maternal Grandfather   . Heart disease Maternal Grandfather   . Emphysema Paternal Grandmother   . Cancer Paternal Grandfather     stomach     Review of Systems  Constitutional: Negative.   HENT: Negative.   Eyes: Negative.   Respiratory: Negative.   Cardiovascular: Negative.  Gastrointestinal: Negative.   Genitourinary: Negative.   Musculoskeletal: Positive for joint pain and myalgias.  Skin: Negative.   Neurological: Negative.   Endo/Heme/Allergies: Negative.   Psychiatric/Behavioral: The patient has insomnia.     Objective:  Physical Exam  Constitutional: She is oriented to person, place, and time. She appears well-developed and well-nourished.  HENT:  Head: Normocephalic and atraumatic.  Eyes: Pupils are equal, round, and reactive to light.  Neck: Normal range of motion. Neck supple.  Cardiovascular: Intact distal pulses.   Respiratory: Effort normal.  Musculoskeletal: She exhibits tenderness.  the patient has continued tenderness over the medial joint line.  She  has obvious crepitus with range of motion.  Her calves are soft and nontender.  She is neurovascularly intact distally.  Neurological: She is alert and oriented to person, place, and time. She has normal reflexes.  Skin: Skin is warm and dry.  Psychiatric: She has a normal mood and affect. Her behavior is normal. Judgment and thought content normal.    Vital signs in last 24 hours:    Labs:   Estimated body mass index is 31.57 kg/(m^2) as calculated from the following:   Height as of 10/22/13: 5\' 1"  (1.549 m).   Weight as of 10/22/13: 75.751 kg (167 lb).   Imaging Review Plain radiographs demonstrate bilateral AP weightbearing, bilateral Rosenberg, lateral and sunrise views of the right knee are taken and reviewed in office today.  Patient does have moderate to severe arthritis of the medial compartment.  Patient also has moderate arthritis of the patellofemoral joint.  She does have a periarticular osteophytes and obvious joint space narrowing.  Assessment/Plan:  End stage arthritis, right knee   The patient history, physical examination, clinical judgment of the provider and imaging studies are consistent with end stage degenerative joint disease of the right knee(s) and total knee arthroplasty is deemed medically necessary. The treatment options including medical management, injection therapy arthroscopy and arthroplasty were discussed at length. The risks and benefits of total knee arthroplasty were presented and reviewed. The risks due to aseptic loosening, infection, stiffness, patella tracking problems, thromboembolic complications and other imponderables were discussed. The patient acknowledged the explanation, agreed to proceed with the plan and consent was signed. Patient is being admitted for inpatient treatment for surgery, pain control, PT, OT, prophylactic antibiotics, VTE prophylaxis, progressive ambulation and ADL's and discharge planning. The patient is planning to be  discharged home with home health services

## 2013-12-21 ENCOUNTER — Ambulatory Visit: Payer: BC Managed Care – PPO

## 2013-12-23 DIAGNOSIS — M1711 Unilateral primary osteoarthritis, right knee: Secondary | ICD-10-CM | POA: Diagnosis present

## 2013-12-23 MED ORDER — KCL IN DEXTROSE-NACL 40-5-0.45 MEQ/L-%-% IV SOLN
INTRAVENOUS | Status: DC
  Filled 2013-12-23 (×2): qty 1000

## 2013-12-23 MED ORDER — CEFAZOLIN SODIUM-DEXTROSE 2-3 GM-% IV SOLR
2.0000 g | INTRAVENOUS | Status: AC
  Administered 2013-12-24: 2 g via INTRAVENOUS
  Filled 2013-12-23: qty 50

## 2013-12-23 NOTE — Anesthesia Preprocedure Evaluation (Addendum)
Anesthesia Evaluation  Patient identified by MRN, date of birth, ID band Patient awake    Reviewed: Allergy & Precautions, H&P , NPO status , Patient's Chart, lab work & pertinent test results  Airway Mallampati: II   Neck ROM: Full    Dental  (+) Missing, Dental Advisory Given   Pulmonary  breath sounds clear to auscultation        Cardiovascular hypertension, Pt. on medications Rhythm:Regular     Neuro/Psych Anxiety Depression    GI/Hepatic GERD-  Medicated,(+) Hepatitis -, C  Endo/Other    Renal/GU      Musculoskeletal   Abdominal (+)  Abdomen: soft.    Peds  Hematology   Anesthesia Other Findings   Reproductive/Obstetrics                            Anesthesia Physical Anesthesia Plan  ASA: III  Anesthesia Plan:    Post-op Pain Management:    Induction: Intravenous  Airway Management Planned: Oral ETT  Additional Equipment:   Intra-op Plan:   Post-operative Plan: Extubation in OR  Informed Consent: I have reviewed the patients History and Physical, chart, labs and discussed the procedure including the risks, benefits and alternatives for the proposed anesthesia with the patient or authorized representative who has indicated his/her understanding and acceptance.     Plan Discussed with:   Anesthesia Plan Comments:         Anesthesia Quick Evaluation

## 2013-12-24 ENCOUNTER — Encounter (HOSPITAL_COMMUNITY): Payer: Self-pay | Admitting: *Deleted

## 2013-12-24 ENCOUNTER — Inpatient Hospital Stay (HOSPITAL_COMMUNITY)
Admission: RE | Admit: 2013-12-24 | Discharge: 2013-12-26 | DRG: 470 | Disposition: A | Discharge status: Home Health | Payer: BC Managed Care – PPO | Source: Ambulatory Visit | Attending: Orthopedic Surgery | Admitting: Orthopedic Surgery

## 2013-12-24 ENCOUNTER — Encounter (HOSPITAL_COMMUNITY)
Admission: RE | Disposition: A | Discharge status: Home Health | Payer: Self-pay | Source: Ambulatory Visit | Attending: Orthopedic Surgery

## 2013-12-24 ENCOUNTER — Inpatient Hospital Stay (HOSPITAL_COMMUNITY): Payer: BC Managed Care – PPO | Admitting: Anesthesiology

## 2013-12-24 DIAGNOSIS — B1920 Unspecified viral hepatitis C without hepatic coma: Secondary | ICD-10-CM | POA: Diagnosis present

## 2013-12-24 DIAGNOSIS — M1710 Unilateral primary osteoarthritis, unspecified knee: Secondary | ICD-10-CM | POA: Diagnosis present

## 2013-12-24 DIAGNOSIS — M25561 Pain in right knee: Secondary | ICD-10-CM | POA: Diagnosis present

## 2013-12-24 DIAGNOSIS — M1711 Unilateral primary osteoarthritis, right knee: Principal | ICD-10-CM | POA: Diagnosis present

## 2013-12-24 DIAGNOSIS — K219 Gastro-esophageal reflux disease without esophagitis: Secondary | ICD-10-CM | POA: Diagnosis present

## 2013-12-24 DIAGNOSIS — I10 Essential (primary) hypertension: Secondary | ICD-10-CM | POA: Diagnosis present

## 2013-12-24 DIAGNOSIS — F329 Major depressive disorder, single episode, unspecified: Secondary | ICD-10-CM | POA: Diagnosis present

## 2013-12-24 DIAGNOSIS — G8929 Other chronic pain: Secondary | ICD-10-CM | POA: Diagnosis present

## 2013-12-24 HISTORY — PX: TOTAL KNEE ARTHROPLASTY: SHX125

## 2013-12-24 SURGERY — ARTHROPLASTY, KNEE, TOTAL
Anesthesia: General | Site: Knee | Laterality: Right

## 2013-12-24 MED ORDER — EPHEDRINE SULFATE 50 MG/ML IJ SOLN
INTRAMUSCULAR | Status: DC | PRN
  Administered 2013-12-24: 5 mg via INTRAVENOUS
  Administered 2013-12-24: 10 mg via INTRAVENOUS

## 2013-12-24 MED ORDER — CELECOXIB 200 MG PO CAPS
200.0000 mg | ORAL_CAPSULE | Freq: Two times a day (BID) | ORAL | Status: DC
  Administered 2013-12-24 – 2013-12-26 (×5): 200 mg via ORAL
  Filled 2013-12-24 (×8): qty 1

## 2013-12-24 MED ORDER — METHOCARBAMOL 500 MG PO TABS
500.0000 mg | ORAL_TABLET | Freq: Four times a day (QID) | ORAL | Status: DC | PRN
  Administered 2013-12-24 – 2013-12-26 (×8): 500 mg via ORAL
  Filled 2013-12-24 (×8): qty 1

## 2013-12-24 MED ORDER — ALPRAZOLAM 0.5 MG PO TABS
0.5000 mg | ORAL_TABLET | Freq: Three times a day (TID) | ORAL | Status: DC | PRN
  Administered 2013-12-24 – 2013-12-26 (×2): 0.5 mg via ORAL
  Filled 2013-12-24 (×2): qty 1

## 2013-12-24 MED ORDER — HYDROMORPHONE HCL 1 MG/ML IJ SOLN
0.5000 mg | INTRAMUSCULAR | Status: DC | PRN
  Administered 2013-12-24 – 2013-12-25 (×10): 1 mg via INTRAVENOUS
  Filled 2013-12-24 (×11): qty 1

## 2013-12-24 MED ORDER — METHOCARBAMOL 500 MG PO TABS
ORAL_TABLET | ORAL | Status: AC
  Administered 2013-12-24: 10:00:00
  Filled 2013-12-24: qty 1

## 2013-12-24 MED ORDER — FENTANYL CITRATE 0.05 MG/ML IJ SOLN
INTRAMUSCULAR | Status: AC
  Administered 2013-12-24: 10:00:00
  Filled 2013-12-24: qty 2

## 2013-12-24 MED ORDER — KCL IN DEXTROSE-NACL 20-5-0.45 MEQ/L-%-% IV SOLN
INTRAVENOUS | Status: DC
  Administered 2013-12-24 (×2): 125 mL/h via INTRAVENOUS
  Administered 2013-12-25: 07:00:00 via INTRAVENOUS
  Filled 2013-12-24 (×8): qty 1000

## 2013-12-24 MED ORDER — LIDOCAINE HCL (CARDIAC) 20 MG/ML IV SOLN
INTRAVENOUS | Status: DC | PRN
  Administered 2013-12-24: 100 mg via INTRAVENOUS

## 2013-12-24 MED ORDER — LORATADINE 10 MG PO TABS
10.0000 mg | ORAL_TABLET | Freq: Every day | ORAL | Status: DC | PRN
  Filled 2013-12-24: qty 1

## 2013-12-24 MED ORDER — OXYCODONE-ACETAMINOPHEN 5-325 MG PO TABS: 1.0000 | ORAL_TABLET | Freq: Four times a day (QID) | ORAL | Status: DC | PRN

## 2013-12-24 MED ORDER — CITALOPRAM HYDROBROMIDE 40 MG PO TABS
40.0000 mg | ORAL_TABLET | Freq: Every morning | ORAL | Status: DC
  Administered 2013-12-25 – 2013-12-26 (×2): 40 mg via ORAL
  Filled 2013-12-24 (×3): qty 1

## 2013-12-24 MED ORDER — ROCURONIUM BROMIDE 50 MG/5ML IV SOLN
INTRAVENOUS | Status: AC
Start: 2013-12-24 — End: 2013-12-24
  Filled 2013-12-24: qty 1

## 2013-12-24 MED ORDER — OXYCODONE HCL 5 MG PO TABS
5.0000 mg | ORAL_TABLET | ORAL | Status: DC | PRN
  Administered 2013-12-24 – 2013-12-26 (×9): 10 mg via ORAL
  Filled 2013-12-24 (×8): qty 2

## 2013-12-24 MED ORDER — BUPIVACAINE-EPINEPHRINE (PF) 0.5% -1:200000 IJ SOLN
INTRAMUSCULAR | Status: DC | PRN
  Administered 2013-12-24: 30 mL via PERINEURAL

## 2013-12-24 MED ORDER — CEFUROXIME SODIUM 1.5 G IJ SOLR
INTRAMUSCULAR | Status: DC | PRN
  Administered 2013-12-24: 1.5 g

## 2013-12-24 MED ORDER — PANTOPRAZOLE SODIUM 40 MG PO TBEC
40.0000 mg | DELAYED_RELEASE_TABLET | Freq: Every day | ORAL | Status: DC
  Administered 2013-12-25 – 2013-12-26 (×2): 40 mg via ORAL
  Filled 2013-12-24 (×2): qty 1

## 2013-12-24 MED ORDER — FLEET ENEMA 7-19 GM/118ML RE ENEM: 1.0000 | ENEMA | Freq: Once | RECTAL | Status: AC | PRN

## 2013-12-24 MED ORDER — PROPOFOL 10 MG/ML IV BOLUS
INTRAVENOUS | Status: DC | PRN
  Administered 2013-12-24: 150 mg via INTRAVENOUS

## 2013-12-24 MED ORDER — BISACODYL 5 MG PO TBEC: 5.0000 mg | DELAYED_RELEASE_TABLET | Freq: Every day | ORAL | Status: DC | PRN

## 2013-12-24 MED ORDER — OXYCODONE HCL 5 MG PO TABS
ORAL_TABLET | ORAL | Status: AC
  Administered 2013-12-24: 11:00:00
  Filled 2013-12-24: qty 2

## 2013-12-24 MED ORDER — ASPIRIN EC 325 MG PO TBEC
325.0000 mg | DELAYED_RELEASE_TABLET | Freq: Every day | ORAL | Status: DC
  Administered 2013-12-25 – 2013-12-26 (×2): 325 mg via ORAL
  Filled 2013-12-24 (×3): qty 1

## 2013-12-24 MED ORDER — ROCURONIUM BROMIDE 100 MG/10ML IV SOLN
INTRAVENOUS | Status: DC | PRN
Start: 2013-12-24 — End: 2013-12-24
  Administered 2013-12-24: 50 mg via INTRAVENOUS

## 2013-12-24 MED ORDER — METOCLOPRAMIDE HCL 10 MG PO TABS: 5.0000 mg | ORAL_TABLET | Freq: Three times a day (TID) | ORAL | Status: DC | PRN

## 2013-12-24 MED ORDER — CEFUROXIME SODIUM 1.5 G IJ SOLR
INTRAMUSCULAR | Status: AC
  Filled 2013-12-24: qty 1.5

## 2013-12-24 MED ORDER — METOCLOPRAMIDE HCL 5 MG/ML IJ SOLN: 5.0000 mg | Freq: Three times a day (TID) | INTRAMUSCULAR | Status: DC | PRN

## 2013-12-24 MED ORDER — FENTANYL CITRATE 0.05 MG/ML IJ SOLN
INTRAMUSCULAR | Status: AC
  Filled 2013-12-24: qty 5

## 2013-12-24 MED ORDER — FENTANYL CITRATE 0.05 MG/ML IJ SOLN
INTRAMUSCULAR | Status: DC | PRN
  Administered 2013-12-24 (×5): 50 ug via INTRAVENOUS

## 2013-12-24 MED ORDER — GLYCOPYRROLATE 0.2 MG/ML IJ SOLN
INTRAMUSCULAR | Status: AC
  Filled 2013-12-24: qty 2

## 2013-12-24 MED ORDER — ACETAMINOPHEN 650 MG RE SUPP: 650.0000 mg | Freq: Four times a day (QID) | RECTAL | Status: DC | PRN

## 2013-12-24 MED ORDER — FENTANYL CITRATE 0.05 MG/ML IJ SOLN
25.0000 ug | INTRAMUSCULAR | Status: DC | PRN
  Administered 2013-12-24: 50 ug via INTRAVENOUS
  Administered 2013-12-24 (×2): 25 ug via INTRAVENOUS
  Administered 2013-12-24: 50 ug via INTRAVENOUS

## 2013-12-24 MED ORDER — DOCUSATE SODIUM 100 MG PO CAPS
100.0000 mg | ORAL_CAPSULE | Freq: Two times a day (BID) | ORAL | Status: DC
  Administered 2013-12-24 – 2013-12-26 (×5): 100 mg via ORAL
  Filled 2013-12-24 (×6): qty 1

## 2013-12-24 MED ORDER — OXYCODONE-ACETAMINOPHEN 5-325 MG PO TABS
1.0000 | ORAL_TABLET | Freq: Four times a day (QID) | ORAL | Status: DC | PRN
  Administered 2013-12-24 – 2013-12-26 (×5): 1 via ORAL
  Filled 2013-12-24 (×6): qty 1

## 2013-12-24 MED ORDER — 0.9 % SODIUM CHLORIDE (POUR BTL) OPTIME
TOPICAL | Status: DC | PRN
  Administered 2013-12-24: 1000 mL

## 2013-12-24 MED ORDER — MEPERIDINE HCL 25 MG/ML IJ SOLN: 6.2500 mg | INTRAMUSCULAR | Status: DC | PRN

## 2013-12-24 MED ORDER — ACETAMINOPHEN 325 MG PO TABS: 650.0000 mg | ORAL_TABLET | Freq: Four times a day (QID) | ORAL | Status: DC | PRN

## 2013-12-24 MED ORDER — SODIUM CHLORIDE 0.9 % IR SOLN
Status: DC | PRN
  Administered 2013-12-24: 1000 mL

## 2013-12-24 MED ORDER — ONDANSETRON HCL 4 MG/2ML IJ SOLN: 4.0000 mg | Freq: Four times a day (QID) | INTRAMUSCULAR | Status: DC | PRN

## 2013-12-24 MED ORDER — METHOCARBAMOL 500 MG PO TABS: 500.0000 mg | ORAL_TABLET | Freq: Two times a day (BID) | ORAL | Status: DC

## 2013-12-24 MED ORDER — SUMATRIPTAN SUCCINATE 100 MG PO TABS
100.0000 mg | ORAL_TABLET | ORAL | Status: DC | PRN
  Filled 2013-12-24: qty 1

## 2013-12-24 MED ORDER — TEMAZEPAM 15 MG PO CAPS
30.0000 mg | ORAL_CAPSULE | Freq: Every evening | ORAL | Status: DC | PRN
  Administered 2013-12-25 (×2): 30 mg via ORAL
  Filled 2013-12-24 (×2): qty 2

## 2013-12-24 MED ORDER — ASPIRIN EC 325 MG PO TBEC: 325.0000 mg | DELAYED_RELEASE_TABLET | Freq: Two times a day (BID) | ORAL | Status: DC

## 2013-12-24 MED ORDER — ALUM & MAG HYDROXIDE-SIMETH 200-200-20 MG/5ML PO SUSP: 30.0000 mL | ORAL | Status: DC | PRN

## 2013-12-24 MED ORDER — HYDROMORPHONE HCL 1 MG/ML IJ SOLN
0.5000 mg | Freq: Once | INTRAMUSCULAR | Status: AC
  Administered 2013-12-24: 0.5 mg via INTRAVENOUS

## 2013-12-24 MED ORDER — GLYCOPYRROLATE 0.2 MG/ML IJ SOLN
INTRAMUSCULAR | Status: DC | PRN
  Administered 2013-12-24: 0.4 mg via INTRAVENOUS

## 2013-12-24 MED ORDER — SODIUM CHLORIDE 0.9 % IJ SOLN
INTRAMUSCULAR | Status: DC | PRN
  Administered 2013-12-24: 40 mL via INTRAVENOUS

## 2013-12-24 MED ORDER — NEOSTIGMINE METHYLSULFATE 10 MG/10ML IV SOLN
INTRAVENOUS | Status: AC
  Filled 2013-12-24: qty 1

## 2013-12-24 MED ORDER — EPHEDRINE SULFATE 50 MG/ML IJ SOLN
INTRAMUSCULAR | Status: AC
  Filled 2013-12-24: qty 1

## 2013-12-24 MED ORDER — NEOSTIGMINE METHYLSULFATE 10 MG/10ML IV SOLN
INTRAVENOUS | Status: DC | PRN
  Administered 2013-12-24: 3 mg via INTRAVENOUS

## 2013-12-24 MED ORDER — SENNOSIDES-DOCUSATE SODIUM 8.6-50 MG PO TABS: 1.0000 | ORAL_TABLET | Freq: Every evening | ORAL | Status: DC | PRN

## 2013-12-24 MED ORDER — TRANEXAMIC ACID 100 MG/ML IV SOLN
1000.0000 mg | INTRAVENOUS | Status: DC
  Filled 2013-12-24: qty 10

## 2013-12-24 MED ORDER — BUPIVACAINE LIPOSOME 1.3 % IJ SUSP
20.0000 mL | Freq: Once | INTRAMUSCULAR | Status: AC
  Administered 2013-12-24: 20 mL
  Filled 2013-12-24: qty 20

## 2013-12-24 MED ORDER — DIPHENHYDRAMINE HCL 12.5 MG/5ML PO ELIX: 12.5000 mg | ORAL_SOLUTION | ORAL | Status: DC | PRN

## 2013-12-24 MED ORDER — PROPOFOL 10 MG/ML IV BOLUS
INTRAVENOUS | Status: AC
  Filled 2013-12-24: qty 20

## 2013-12-24 MED ORDER — PHENOL 1.4 % MT LIQD
1.0000 | OROMUCOSAL | Status: DC | PRN
  Administered 2013-12-24: 1 via OROMUCOSAL
  Filled 2013-12-24: qty 177

## 2013-12-24 MED ORDER — MENTHOL 3 MG MT LOZG: 1.0000 | LOZENGE | OROMUCOSAL | Status: DC | PRN

## 2013-12-24 MED ORDER — MIDAZOLAM HCL 2 MG/2ML IJ SOLN
INTRAMUSCULAR | Status: AC
Start: 2013-12-24 — End: 2013-12-24
  Filled 2013-12-24: qty 2

## 2013-12-24 MED ORDER — TRANEXAMIC ACID 100 MG/ML IV SOLN
1000.0000 mg | INTRAVENOUS | Status: AC
  Administered 2013-12-24: 1000 mg via INTRAVENOUS
  Filled 2013-12-24: qty 10

## 2013-12-24 MED ORDER — ONDANSETRON HCL 4 MG/2ML IJ SOLN
INTRAMUSCULAR | Status: AC
Start: 2013-12-24 — End: 2013-12-24
  Filled 2013-12-24: qty 2

## 2013-12-24 MED ORDER — ONDANSETRON HCL 4 MG/2ML IJ SOLN
INTRAMUSCULAR | Status: DC | PRN
  Administered 2013-12-24: 4 mg via INTRAVENOUS

## 2013-12-24 MED ORDER — HYDROCHLOROTHIAZIDE 12.5 MG PO CAPS
12.5000 mg | ORAL_CAPSULE | Freq: Every morning | ORAL | Status: DC
  Administered 2013-12-25 – 2013-12-26 (×2): 12.5 mg via ORAL
  Filled 2013-12-24 (×2): qty 1

## 2013-12-24 MED ORDER — LACTATED RINGERS IV SOLN
INTRAVENOUS | Status: DC | PRN
  Administered 2013-12-24: 07:00:00 via INTRAVENOUS

## 2013-12-24 MED ORDER — MIDAZOLAM HCL 5 MG/5ML IJ SOLN
INTRAMUSCULAR | Status: DC | PRN
  Administered 2013-12-24: 2 mg via INTRAVENOUS

## 2013-12-24 MED ORDER — SUCCINYLCHOLINE CHLORIDE 20 MG/ML IJ SOLN
INTRAMUSCULAR | Status: AC
  Filled 2013-12-24: qty 1

## 2013-12-24 MED ORDER — SODIUM CHLORIDE 0.9 % IJ SOLN
INTRAMUSCULAR | Status: AC
  Filled 2013-12-24: qty 10

## 2013-12-24 MED ORDER — ONDANSETRON HCL 4 MG PO TABS: 4.0000 mg | ORAL_TABLET | Freq: Four times a day (QID) | ORAL | Status: DC | PRN

## 2013-12-24 MED ORDER — PROMETHAZINE HCL 25 MG/ML IJ SOLN: 6.2500 mg | INTRAMUSCULAR | Status: DC | PRN

## 2013-12-24 MED ORDER — HYDROMORPHONE HCL 1 MG/ML IJ SOLN
INTRAMUSCULAR | Status: AC
  Filled 2013-12-24: qty 1

## 2013-12-24 MED ORDER — LIDOCAINE HCL (CARDIAC) 20 MG/ML IV SOLN
INTRAVENOUS | Status: AC
  Filled 2013-12-24: qty 5

## 2013-12-24 MED ORDER — DEXTROSE 5 % IV SOLN
500.0000 mg | Freq: Four times a day (QID) | INTRAVENOUS | Status: DC | PRN
  Filled 2013-12-24: qty 5

## 2013-12-24 MED ORDER — KCL IN DEXTROSE-NACL 20-5-0.45 MEQ/L-%-% IV SOLN
INTRAVENOUS | Status: AC
  Filled 2013-12-24: qty 1000

## 2013-12-24 MED ORDER — FLUOCINONIDE-E 0.05 % EX CREA
TOPICAL_CREAM | Freq: Two times a day (BID) | CUTANEOUS | Status: DC
  Administered 2013-12-24 – 2013-12-25 (×2): via TOPICAL
  Filled 2013-12-24: qty 15

## 2013-12-24 SURGICAL SUPPLY — 61 items
BANDAGE ELASTIC 6 VELCRO ST LF (GAUZE/BANDAGES/DRESSINGS) ×2 IMPLANT
BANDAGE ESMARK 6X9 LF (GAUZE/BANDAGES/DRESSINGS) ×1 IMPLANT
BLADE SAG 18X100X1.27 (BLADE) ×2 IMPLANT
BLADE SAW SGTL 13X75X1.27 (BLADE) ×2 IMPLANT
BLADE SURG ROTATE 9660 (MISCELLANEOUS) IMPLANT
BNDG ELASTIC 6X10 VLCR STRL LF (GAUZE/BANDAGES/DRESSINGS) ×2 IMPLANT
BNDG ESMARK 6X9 LF (GAUZE/BANDAGES/DRESSINGS) ×2
BOWL SMART MIX CTS (DISPOSABLE) ×2 IMPLANT
CAPT RP KNEE ×2 IMPLANT
CEMENT HV SMART SET (Cement) ×4 IMPLANT
COVER SURGICAL LIGHT HANDLE (MISCELLANEOUS) ×2 IMPLANT
CUFF TOURNIQUET SINGLE 34IN LL (TOURNIQUET CUFF) IMPLANT
CUFF TOURNIQUET SINGLE 44IN (TOURNIQUET CUFF) IMPLANT
DRAPE EXTREMITY T 121X128X90 (DRAPE) ×2 IMPLANT
DRAPE IMP U-DRAPE 54X76 (DRAPES) ×2 IMPLANT
DRAPE U-SHAPE 47X51 STRL (DRAPES) ×2 IMPLANT
DURAPREP 26ML APPLICATOR (WOUND CARE) ×4 IMPLANT
ELECT REM PT RETURN 9FT ADLT (ELECTROSURGICAL) ×2
ELECTRODE REM PT RTRN 9FT ADLT (ELECTROSURGICAL) ×1 IMPLANT
EVACUATOR 1/8 PVC DRAIN (DRAIN) ×2 IMPLANT
GAUZE SPONGE 4X4 12PLY STRL (GAUZE/BANDAGES/DRESSINGS) ×4 IMPLANT
GAUZE XEROFORM 1X8 LF (GAUZE/BANDAGES/DRESSINGS) ×2 IMPLANT
GLOVE BIO SURGEON STRL SZ7.5 (GLOVE) IMPLANT
GLOVE BIO SURGEON STRL SZ8.5 (GLOVE) IMPLANT
GLOVE BIOGEL PI IND STRL 8 (GLOVE) ×1 IMPLANT
GLOVE BIOGEL PI IND STRL 9 (GLOVE) ×1 IMPLANT
GLOVE BIOGEL PI INDICATOR 8 (GLOVE) ×1
GLOVE BIOGEL PI INDICATOR 9 (GLOVE) ×1
GOWN STRL REUS W/ TWL LRG LVL3 (GOWN DISPOSABLE) ×1 IMPLANT
GOWN STRL REUS W/ TWL XL LVL3 (GOWN DISPOSABLE) ×2 IMPLANT
GOWN STRL REUS W/TWL LRG LVL3 (GOWN DISPOSABLE) ×1
GOWN STRL REUS W/TWL XL LVL3 (GOWN DISPOSABLE) ×2
HANDPIECE INTERPULSE COAX TIP (DISPOSABLE) ×1
HOOD PEEL AWAY FACE SHEILD DIS (HOOD) ×4 IMPLANT
KIT BASIN OR (CUSTOM PROCEDURE TRAY) ×2 IMPLANT
KIT ROOM TURNOVER OR (KITS) ×2 IMPLANT
MANIFOLD NEPTUNE II (INSTRUMENTS) ×2 IMPLANT
NDL SAFETY ECLIPSE 18X1.5 (NEEDLE) IMPLANT
NEEDLE 22X1 1/2 (OR ONLY) (NEEDLE) ×2 IMPLANT
NEEDLE HYPO 18GX1.5 SHARP (NEEDLE)
NEEDLE SPNL 18GX3.5 QUINCKE PK (NEEDLE) IMPLANT
NS IRRIG 1000ML POUR BTL (IV SOLUTION) ×2 IMPLANT
PACK TOTAL JOINT (CUSTOM PROCEDURE TRAY) ×2 IMPLANT
PACK UNIVERSAL I (CUSTOM PROCEDURE TRAY) ×2 IMPLANT
PAD ARMBOARD 7.5X6 YLW CONV (MISCELLANEOUS) ×4 IMPLANT
PADDING CAST COTTON 6X4 STRL (CAST SUPPLIES) ×2 IMPLANT
SET HNDPC FAN SPRY TIP SCT (DISPOSABLE) ×1 IMPLANT
SPONGE GAUZE 4X4 12PLY STER LF (GAUZE/BANDAGES/DRESSINGS) ×2 IMPLANT
STAPLER VISISTAT 35W (STAPLE) ×2 IMPLANT
SUCTION FRAZIER TIP 10 FR DISP (SUCTIONS) ×2 IMPLANT
SUT VIC AB 0 CTX 36 (SUTURE) ×1
SUT VIC AB 0 CTX36XBRD ANTBCTR (SUTURE) ×1 IMPLANT
SUT VIC AB 1 CTX 36 (SUTURE) ×1
SUT VIC AB 1 CTX36XBRD ANBCTR (SUTURE) ×1 IMPLANT
SUT VIC AB 2-0 CT1 27 (SUTURE) ×1
SUT VIC AB 2-0 CT1 TAPERPNT 27 (SUTURE) ×1 IMPLANT
SYR 30ML LL (SYRINGE) ×2 IMPLANT
SYR 50ML LL SCALE MARK (SYRINGE) ×2 IMPLANT
TOWEL OR 17X24 6PK STRL BLUE (TOWEL DISPOSABLE) ×2 IMPLANT
TOWEL OR 17X26 10 PK STRL BLUE (TOWEL DISPOSABLE) ×2 IMPLANT
WATER STERILE IRR 1000ML POUR (IV SOLUTION) IMPLANT

## 2013-12-24 NOTE — Op Note (Signed)
PATIENT ID:      Madison Collins  MRN:     [id number] DOB/AGE:    1950/04/09 / 63 y.o.       OPERATIVE REPORT    DATE OF PROCEDURE:  12/24/2013       PREOPERATIVE DIAGNOSIS:   RIGHT KNEE ARTHRITIS       Estimated body mass index is 31.57 kg/(m^2) as calculated from the following:   Height as of 12/12/13: 5\' 1"  (1.549 m).   Weight as of 10/22/13: 75.751 kg (167 lb).                                                        POSTOPERATIVE DIAGNOSIS:   RIGHT KNEE ARTHRITIS                                                                       PROCEDURE:  Procedure(s): RIGHT TOTAL KNEE ARTHROPLASTY Using Depuy Sigma RP implants #3L Femur, #3Tibia, 60mm Sigma RP bearing, 35 Patella     SURGEON: Madison Collins J    ASSISTANT:   [employer]   (Present and scrubbed throughout the case, critical for assistance with exposure, retraction, instrumentation, and closure.)         ANESTHESIA: GET, ACB, Exparel  DRAINS: 2 medium hemovac in knee   TOURNIQUET TIME: 10UVO   COMPLICATIONS:  None     SPECIMENS: None   INDICATIONS FOR PROCEDURE: The patient has  RIGHT KNEE ARTHRITIS , varus deformities, XR shows bone on bone arthritis. Patient has failed all conservative measures including anti-inflammatory medicines, narcotics, attempts at  exercise and weight loss, cortisone injections and viscosupplementation.  Risks and benefits of surgery have been discussed, questions answered.   DESCRIPTION OF PROCEDURE: The patient identified by armband, received  IV antibiotics, in the holding area at [facility]. Patient taken to the operating room, appropriate anesthetic  monitors were attached, and general endotracheal anesthesia induced with  the patient in supine position, Foley catheter was inserted. Tourniquet  applied high to the operative thigh. Lateral post and foot positioner  applied to the table, the lower extremity was then prepped and draped  in usual sterile fashion from the ankle to  the tourniquet. Time-out procedure was performed. The limb was wrapped with an Esmarch bandage and the tourniquet inflated to 350 mmHg. We began the operation by making the anterior midline incision starting at handbreadth above the patella going over the patella 1 cm medial to and  4 cm distal to the tibial tubercle. Small bleeders in the skin and the  subcutaneous tissue identified and cauterized. Transverse retinaculum was incised and reflected medially and a medial parapatellar arthrotomy was accomplished. the patella was everted and theprepatellar fat pad resected. The superficial medial collateral  ligament was then elevated from anterior to posterior along the proximal  flare of the tibia and anterior half of the menisci resected. The knee was hyperflexed exposing bone on bone arthritis. Peripheral and notch osteophytes as well as the cruciate ligaments were then resected. We continued to  work our way  around posteriorly along the proximal tibia, and externally  rotated the tibia subluxing it out from underneath the femur. A McHale  retractor was placed through the notch and a lateral Hohmann retractor  placed, and we then drilled through the proximal tibia in line with the  axis of the tibia followed by an intramedullary guide rod and 2-degree  posterior slope cutting guide. The tibial cutting guide was pinned into place  allowing resection of 5 mm of bone medially and about 12 mm of bone  laterally because of her varus deformity. Satisfied with the tibial resection, we then  entered the distal femur 2 mm anterior to the PCL origin with the  intramedullary guide rod and applied the distal femoral cutting guide  set at 38mm, with 5 degrees of valgus. This was pinned along the  epicondylar axis. At this point, the distal femoral cut was accomplished without difficulty. We then sized for a #3L femoral component and pinned the guide in 3 degrees of external rotation.The chamfer cutting guide was  pinned into place. The anterior, posterior, and chamfer cuts were accomplished without difficulty followed by  the box cutting guide and the box cut. We also removed posterior osteophytes from the posterior femoral condyles. At this  time, the knee was brought into full extension. We checked our  extension and flexion gaps and found them symmetric at 22mm.  The patella thickness measured at 25 mm. We set the cutting guide at 15 and removed the posterior 10 mm  of the patella, sized for a 35 button and drilled the lollipop. The knee  was then once again hyperflexed exposing the proximal tibia. We sized for a #3 tibial base plate, applied the smokestack and the conical reamer followed by the the Delta fin keel punch. We then hammered into place the Sigma RP trial femoral component, inserted a 10-mm trial bearing, trial patellar button, and took the knee through range of motion from 0-130 degrees. No thumb pressure was required for patellar  tracking. At this point, all trial components were removed, a double batch of DePuy HV cement with 1500 mg of Zinacef was mixed and applied to all bony metallic mating surfaces except for the posterior condyles of the femur itself. In order, we  hammered into place the tibial tray and removed excess cement, the femoral component and removed excess cement, a 10-mm Sigma RP bearing  was inserted, and the knee brought to full extension with compression.  The patellar button was clamped into place, and excess cement  removed. While the cement cured the wound was irrigated out with normal saline solution pulse lavage, and medium Hemovac drains were placed from an anterolateral  approach. Ligament stability and patellar tracking were checked and found to be excellent. The parapatellar arthrotomy was closed with  running #1 Vicryl suture. The subcutaneous tissue with 0 and 2-0 undyed  Vicryl suture, and the skin with skin staples. A dressing of Xeroform,  4 x 4, dressing  sponges, Webril, and Ace wrap applied. The patient  awakened, extubated, and taken to recovery room without difficulty.   Dasean Brow J 12/24/2013, 8:45 AM

## 2013-12-24 NOTE — Interval H&P Note (Signed)
History and Physical Interval Note:  12/24/2013 7:13 AM  Madison Collins  has presented today for surgery, with the diagnosis of RIGHT KNEE ARTHRITIS   The various methods of treatment have been discussed with the patient and family. After consideration of risks, benefits and other options for treatment, the patient has consented to  Procedure(s): RIGHT TOTAL KNEE ARTHROPLASTY (Right) as a surgical intervention .  The patient's history has been reviewed, patient examined, no change in status, stable for surgery.  I have reviewed the patient's chart and labs.  Questions were answered to the patient's satisfaction.     Kerin Salen

## 2013-12-24 NOTE — Progress Notes (Signed)
Utilization review completed.  

## 2013-12-24 NOTE — Progress Notes (Signed)
Orthopedic Tech Progress Note Patient Details:  Madison Collins 1950/11/23 [id number] CPM applied to RLE with appropriate settings. OHF applied to bed.  CPM Right Knee CPM Right Knee: On Right Knee Flexion (Degrees): 40 Right Knee Extension (Degrees): 10   [name] 12/24/2013, 11:12 AM

## 2013-12-24 NOTE — Plan of Care (Signed)
Problem: Phase I Progression Outcomes Goal: Dangle or out of bed evening of surgery Outcome: Completed/Met Date Met:  12/24/13

## 2013-12-24 NOTE — Evaluation (Addendum)
Occupational Therapy Evaluation Patient Details Name: Madison Collins MRN: [id number] DOB: 1950-09-05 Today's Date: 12/24/2013    History of Present Illness Patient is a 63 yo female admitted 12/24/13 now s/p Rt TKA.  PMH:  HTN, Hep C, Migraines, Depression, Anxiety, Cervical disc syndrome.   Clinical Impression   Pt s/p above. Pt independent with ADLs, PTA. Feel pt will benefit from acute OT to increase independence with BADLs and mobility prior to d/c. Plan to practice LB ADLs and tub transfer next session.    Follow Up Recommendations  No OT follow up;Supervision - Intermittent    Equipment Recommendations  None recommended by OT    Recommendations for Other Services       Precautions / Restrictions Precautions Precautions: Knee Precaution Comments: educated on precautions Restrictions Weight Bearing Restrictions: Yes RLE Weight Bearing: Weight bearing as tolerated      Mobility Bed Mobility Overal bed mobility: Needs Assistance Bed Mobility: Supine to Sit;Sit to Supine     Supine to sit: Min assist Sit to supine: Modified independent (Device/Increase time)   General bed mobility comments: assist with RLE. Able to return to supine with no physical assist.  Transfers Overall transfer level: Needs assistance Equipment used: Rolling walker (2 wheeled) Transfers: Sit to/from Stand Sit to Stand: Min guard         General transfer comment: cues for technique.    Balance                                            ADL Overall ADL's : Needs assistance/impaired     Grooming: Wash/dry hands;Min guard;Standing           Upper Body Dressing : Set up;Sitting   Lower Body Dressing: Min guard;Sit to/from stand   Toilet Transfer: Min guard;Ambulation;RW;Comfort height toilet;Grab bars   Toileting- Clothing Manipulation and Hygiene: Min guard (sitting/standing)       Functional mobility during ADLs: Min guard;Rolling walker General ADL  Comments: Discussed turning to left side to avoid twisting knee when ambulating. Reviewed dressing technique. Educated on safety (safe shoewear, pets, rugs, sitting for LB ADLs, use of bag on walker). Discussed use of reacher and that AE is available for LB ADLs if it becomes an issue. Educated on benefit of reaching down to donn/doff right sock as it allows knee to bend. Educated on what pt could use for shower chair. Educated on technique for tub transfer and recommended not stepping over for a while. Pt with nausea while in bathroom so returned to bed.      Vision                     Perception     Praxis      Pertinent Vitals/Pain Pain Assessment: 0-10 Pain Score: 8  Pain Location: right knee Pain Intervention(s): Monitored during session;Repositioned     Hand Dominance     Extremity/Trunk Assessment Upper Extremity Assessment Upper Extremity Assessment: Overall WFL for tasks assessed   Lower Extremity Assessment Lower Extremity Assessment: Defer to PT evaluation       Communication Communication Communication: No difficulties   Cognition Arousal/Alertness: Awake/alert Behavior During Therapy: WFL for tasks assessed/performed Overall Cognitive Status: Within Functional Limits for tasks assessed                     General Comments  Exercises       Shoulder Instructions      Home Living Family/patient expects to be discharged to:: Private residence Living Arrangements: Spouse/significant other Available Help at Discharge: Family;Friend(s);Available PRN/intermittently Type of Home: House Home Access: Level entry     Home Layout: One level     Bathroom Shower/Tub: Teacher, early years/pre: Handicapped height     Home Equipment: Environmental consultant - 2 wheels;Bedside commode (thinks she has access to tub bench)          Prior Functioning/Environment Level of Independence: Independent             OT Diagnosis: Acute pain   OT  Problem List: Decreased strength;Decreased activity tolerance;Decreased knowledge of use of DME or AE;Decreased knowledge of precautions;Pain   OT Treatment/Interventions: Self-care/ADL training;DME and/or AE instruction;Therapeutic activities;Patient/family education;Balance training    OT Goals(Current goals can be found in the care plan section) Acute Rehab OT Goals Patient Stated Goal: not stated OT Goal Formulation: With patient Time For Goal Achievement: 12/31/13 Potential to Achieve Goals: Good ADL Goals Pt Will Perform Lower Body Dressing: with modified independence;sit to/from stand Pt Will Transfer to Toilet: with modified independence;ambulating Pt Will Perform Tub/Shower Transfer: Tub transfer;with supervision;ambulating;tub bench;rolling walker;shower seat  OT Frequency: Min 2X/week   Barriers to D/C:            Co-evaluation              End of Session Equipment Utilized During Treatment: Gait belt;Rolling walker  Pt left in bed with call bell within reacher with family/visitor present  Activity Tolerance: Other (comment) (became nauseous) Patient left: in bed;with call bell/phone within reach;with family/visitor present   Time: 7841-2820 OT Time Calculation (min): 30 min Charges:  OT General Charges $OT Visit: 1 Procedure OT Evaluation $Initial OT Evaluation Tier I: 1 Procedure OT Treatments $Self Care/Home Management : 8-22 mins G-CodesBenito Mccreedy OTR/L [phone] 12/24/2013, 6:34 PM

## 2013-12-24 NOTE — Transfer of Care (Signed)
Immediate Anesthesia Transfer of Care Note  Patient: Madison Collins  Procedure(s) Performed: Procedure(s): RIGHT TOTAL KNEE ARTHROPLASTY (Right)  Patient Location: PACU  Anesthesia Type:General  Level of Consciousness: awake, alert  and oriented  Airway & Oxygen Therapy: Patient Spontanous Breathing and Patient connected to nasal cannula oxygen  Post-op Assessment: Report given to PACU RN and Post -op Vital signs reviewed and stable  Post vital signs: Reviewed and stable  Complications: No apparent anesthesia complications

## 2013-12-24 NOTE — Evaluation (Signed)
Physical Therapy Evaluation Patient Details Name: Madison Collins MRN: [id number] DOB: 29-Jun-1950 Today's Date: 12/24/2013   History of Present Illness  Patient is a 63 yo female admitted 12/24/13 now s/p Rt TKA.  PMH:  HTN, Hep C, Migraines, Depression, Anxiety, Cervical disc syndrome.  Clinical Impression  Patient presents with problems listed below.  Will benefit from acute PT to maximize independence prior to discharge home.      Follow Up Recommendations Home health PT;Supervision - Intermittent    Equipment Recommendations  None recommended by PT    Recommendations for Other Services       Precautions / Restrictions Precautions Precautions: Knee Precaution Comments: Reviewed precautions with patient. Restrictions Weight Bearing Restrictions: Yes RLE Weight Bearing: Weight bearing as tolerated      Mobility  Bed Mobility Overal bed mobility: Needs Assistance Bed Mobility: Supine to Sit;Sit to Supine     Supine to sit: Min assist Sit to supine: Min assist   General bed mobility comments: Verbal cues for technique.  Assist to move RLE off of and onto bed.  Patient able to use UE's to move trunk to sitting.  Good balance in sitting.  Transfers Overall transfer level: Needs assistance Equipment used: Rolling walker (2 wheeled) Transfers: Sit to/from Stand Sit to Stand: Min assist         General transfer comment: Verbal cues for hand placement and technique.  Assist to rise to standing and for balance.  Ambulation/Gait Ambulation/Gait assistance: Min assist Ambulation Distance (Feet): 5 Feet Assistive device: Rolling walker (2 wheeled) Gait Pattern/deviations: Step-to pattern;Decreased stance time - right;Decreased step length - left;Decreased weight shift to right;Antalgic   Gait velocity interpretation: Below normal speed for age/gender General Gait Details: Verbal cues for safe use of RW and gait sequence.  Patient able to  ambulate 5' forward and  backward with min assist.  Limited by pain.  Stairs            Wheelchair Mobility    Modified Rankin (Stroke Patients Only)       Balance                                             Pertinent Vitals/Pain Pain Assessment: 0-10 Pain Score: 8  Pain Location: Rt knee Pain Descriptors / Indicators: Aching Pain Intervention(s): Monitored during session;Repositioned    Home Living Family/patient expects to be discharged to:: Private residence Living Arrangements: Spouse/significant other Available Help at Discharge: Family;Friend(s);Available PRN/intermittently Type of Home: House Home Access: Level entry     Home Layout: One level Home Equipment: Walker - 2 wheels;Bedside commode      Prior Function Level of Independence: Independent               Hand Dominance        Extremity/Trunk Assessment   Upper Extremity Assessment: Overall WFL for tasks assessed           Lower Extremity Assessment: RLE deficits/detail RLE Deficits / Details: Decreased strength and ROM post-op    Cervical / Trunk Assessment: Normal  Communication   Communication: No difficulties  Cognition Arousal/Alertness: Awake/alert Behavior During Therapy: Anxious Overall Cognitive Status: Within Functional Limits for tasks assessed                      General Comments      Exercises Total Joint  Exercises Ankle Circles/Pumps: AROM;Both;10 reps;Supine      Assessment/Plan    PT Assessment Patient needs continued PT services  PT Diagnosis Difficulty walking;Acute pain   PT Problem List Decreased strength;Decreased range of motion;Decreased activity tolerance;Decreased balance;Decreased mobility;Decreased knowledge of use of DME;Decreased knowledge of precautions;Pain  PT Treatment Interventions DME instruction;Gait training;Functional mobility training;Therapeutic activities;Therapeutic exercise;Patient/family education   PT Goals (Current  goals can be found in the Care Plan section) Acute Rehab PT Goals Patient Stated Goal: To walk PT Goal Formulation: With patient Time For Goal Achievement: 12/31/13 Potential to Achieve Goals: Good    Frequency 7X/week   Barriers to discharge Decreased caregiver support Fiance works during day.  Patient reports she will have family/friends "in and out".    Co-evaluation               End of Session Equipment Utilized During Treatment: Gait belt Activity Tolerance: Patient limited by pain;Patient limited by fatigue Patient left: in bed;in CPM;with call bell/phone within reach Nurse Communication: Mobility status (In CPM)         Time: 1348-1410 PT Time Calculation (min): 22 min   Charges:   PT Evaluation $Initial PT Evaluation Tier I: 1 Procedure PT Treatments $Therapeutic Activity: 8-22 mins   PT G Codes:          Despina Pole 12/24/2013, 2:35 PM Carita Pian. Sanjuana Kava, [place] Pager [phone]

## 2013-12-24 NOTE — Anesthesia Procedure Notes (Addendum)
Anesthesia Regional Block:  Adductor canal block  Pre-Anesthetic Checklist: ,, timeout performed,, Correct Site,, Correct Procedure,, site marked,,, surgical consent,, at surgeon's request  Laterality: Right  Prep: Maximum Sterile Barrier Precautions used and chloraprep       Needles:   Needle Type: Echogenic Stimulator Needle     Needle Length: 10cm 10 cm Needle Gauge: 22 and 22 G    Additional Needles:  Procedures: ultrasound guided (picture in chart) Adductor canal block Narrative:  Injection made incrementally with aspirations every 5 mL.   Procedure Name: Intubation Date/Time: 12/24/2013 7:33 AM Performed by: Kyung Rudd Pre-anesthesia Checklist: Patient identified, Emergency Drugs available, Suction available, Patient being monitored and Timeout performed Patient Re-evaluated:Patient Re-evaluated prior to inductionOxygen Delivery Method: Circle system utilized Preoxygenation: Pre-oxygenation with 100% oxygen Intubation Type: IV induction Ventilation: Mask ventilation without difficulty Laryngoscope Size: Mac and 3 Grade View: Grade II Tube type: Oral Tube size: 7.0 mm Number of attempts: 1 Airway Equipment and Method: Stylet Placement Confirmation: ETT inserted through vocal cords under direct vision,  positive ETCO2 and breath sounds checked- equal and bilateral Secured at: 21 cm Tube secured with: Tape Dental Injury: Teeth and Oropharynx as per pre-operative assessment  Comments: AOI per Gunnar Bulla, SRNA with Dr. Tresa Moore supervising. +ETCO2 and BBS=.

## 2013-12-24 NOTE — Op Note (Deleted)
Pre-Op Dx: right knee lateral meniscal tear with chondromalacia  Postop Dx: right knee medial and lateral meniscal tears with chondromalacia grade 3 lateral femoral condyle and trochlea with flap tears  Procedure:right knee arthroscopic partial medial and lateral meniscectomies posterior horns and debris and chondromalacia grade 3 with flap tears from the lateral femoral condyle and trochlea  Surgeon: Kathalene Frames. Mayer Camel M.D.  Assist: Kerry Hough. Barton Dubois  (present throughout entire procedure and necessary for timely completion of the procedure) Anes: General LMA  EBL: Minimal  Fluids: 800 cc   Indications: patient has catching popping and pain in her right knee primarily along the lateral side of the joint. This is gone on for many months and is getting worse over time.. Pt has failed conservative treatment with anti-inflammatory medicines, physical therapy, and modified activites but did get good temporarily from an intra-articular cortisone injection. Pain has recurred and patient desires elective arthroscopic evaluation and treatment of knee. Risks and benefits of surgery have been discussed and questions answered.  Procedure: Patient identified by arm band and taken to the operating room at the day surgery Center. The appropriate anesthetic monitors were attached, and General LMA anesthesia was induced without difficulty. Lateral post was applied to the table and the lower extremity was prepped and draped in usual sterile fashion from the ankle to the midthigh. Time out procedure was performed. We began the operation by making standard inferior lateral and inferior medial peripatellar portals with a #11 blade allowing introduction of the arthroscope through the inferior lateral portal and the out flow to the inferior medial portal. Pump pressure was set at 100 mmHg and diagnostic arthroscopy  revealed grade 3 chondromalacia the trochlea that was debrided back to a stable margin of 3.5 mm Gator sucker  shaver there was some mild grade 2 chondromalacia of the apex of the patella was also debrided. Moving into the medial compartment the posterior horn of medial meniscus had extensive degenerative tearing and was debrided back to a stable margin with a small biter, upbiter, and 3.5 mm Gator sucker shaver the articular cartilage medially had grade 1-2 chondromalacia and was lightly debrided. The cruciate ligaments were intact. On the lateral side there is extensive degenerative tearing of the posterior lateral horns of the lateral meniscus and likewise this is debrided back to a stable margin and thoroughly probed. Lateral femoral condyle had grade 3 chondral malacia flap tears and was likewise debrided. The knee was irrigated out normal saline solution. A dressing of xerofoam 4 x 4 dressing sponges, web roll and an Ace wrap was applied. The patient was awakened extubated and taken to the recovery without difficulty.    Signed: Kerin Salen, MD

## 2013-12-24 NOTE — Anesthesia Postprocedure Evaluation (Signed)
  Anesthesia Post-op Note  Patient: Madison Collins  Procedure(s) Performed: Procedure(s): RIGHT TOTAL KNEE ARTHROPLASTY (Right)  Patient Location: PACU  Anesthesia Type:General  Level of Consciousness: awake and alert   Airway and Oxygen Therapy: Patient Spontanous Breathing and Patient connected to nasal cannula oxygen  Post-op Pain: moderate  Post-op Assessment: Post-op Vital signs reviewed, Patient's Cardiovascular Status Stable, Patent Airway and No signs of Nausea or vomiting  Post-op Vital Signs: Reviewed and stable  Last Vitals:  Filed Vitals:   12/24/13 0915  BP: 142/76  Pulse: 98  Temp: 36.6 C  Resp: 14    Complications: No apparent anesthesia complications

## 2013-12-25 LAB — CBC
HCT: 32.5 % — ABNORMAL LOW (ref 36.0–46.0)
HEMOGLOBIN: 10.9 g/dL — AB (ref 12.0–15.0)
MCH: 29.5 pg (ref 26.0–34.0)
MCHC: 33.5 g/dL (ref 30.0–36.0)
MCV: 87.8 fL (ref 78.0–100.0)
Platelets: 270 10*3/uL (ref 150–400)
RBC: 3.7 MIL/uL — ABNORMAL LOW (ref 3.87–5.11)
RDW: 12.7 % (ref 11.5–15.5)
WBC: 12.2 10*3/uL — ABNORMAL HIGH (ref 4.0–10.5)

## 2013-12-25 LAB — BASIC METABOLIC PANEL
ANION GAP: 16 — AB (ref 5–15)
BUN: 7 mg/dL (ref 6–23)
CALCIUM: 8.8 mg/dL (ref 8.4–10.5)
CHLORIDE: 95 meq/L — AB (ref 96–112)
CO2: 23 mEq/L (ref 19–32)
CREATININE: 0.61 mg/dL (ref 0.50–1.10)
GFR calc Af Amer: >90 mL/min (ref >90)
GFR calc non Af Amer: >90 mL/min (ref >90)
GLUCOSE: 149 mg/dL — AB (ref 70–99)
Potassium: 4 mEq/L (ref 3.7–5.3)
Sodium: 134 mEq/L — ABNORMAL LOW (ref 137–147)

## 2013-12-25 NOTE — Plan of Care (Signed)
Problem: Phase I Progression Outcomes Goal: CMS/Neurovascular status WDL Outcome: Completed/Met Date Met:  12/25/13 Goal: Hemodynamically stable Outcome: Completed/Met Date Met:  12/25/13

## 2013-12-25 NOTE — Progress Notes (Signed)
Physical Therapy Treatment Patient Details Name: Madison Collins MRN: [id number] DOB: 07/13/1950 Today's Date: 12/25/2013    History of Present Illness Patient is a 63 yo female admitted 12/24/13 now s/p Rt TKA.  PMH:  HTN, Hep C, Migraines, Depression, Anxiety, Cervical disc syndrome.    PT Comments    Much improved activity tolerance and functional mobility this PM session with pain under control; Able to increase gait distance; Nice, stable knee in stance; on track for dc home tomorrow.  Follow Up Recommendations  Home health PT;Supervision - Intermittent     Equipment Recommendations  None recommended by PT    Recommendations for Other Services       Precautions / Restrictions Precautions Precautions: Knee Precaution Comments: Pt educated to not allow any pillow or bolster under knee for healing with optimal range of motion.  Restrictions RLE Weight Bearing: Weight bearing as tolerated    Mobility  Bed Mobility Overal bed mobility: Needs Assistance Bed Mobility: Supine to Sit;Sit to Supine     Supine to sit: Min guard Sit to supine: Min guard   General bed mobility comments: Able to get in and out of bed without physical assist; Uses LLE to assist RLE  Transfers Overall transfer level: Needs assistance Equipment used: Rolling walker (2 wheeled) Transfers: Sit to/from Stand Sit to Stand: Supervision         General transfer comment: cues for technique.  Ambulation/Gait Ambulation/Gait assistance: Supervision Ambulation Distance (Feet): 250 Feet Assistive device: Rolling walker (2 wheeled) Gait Pattern/deviations: Step-through pattern Gait velocity: slowed   General Gait Details: Much smoother gait this afternoon, and pt seemded pleased that she could walk further with her pain controlled   Stairs            Wheelchair Mobility    Modified Rankin (Stroke Patients Only)       Balance                                     Cognition Arousal/Alertness: Awake/alert Behavior During Therapy: WFL for tasks assessed/performed Overall Cognitive Status: Within Functional Limits for tasks assessed                      Exercises      General Comments        Pertinent Vitals/Pain Pain Assessment: 0-10 Pain Score: 6  Pain Location: R Knee Pain Descriptors / Indicators: Aching Pain Intervention(s): Monitored during session;Repositioned    Home Living                      Prior Function            PT Goals (current goals can now be found in the care plan section) Acute Rehab PT Goals Patient Stated Goal: wants to get back to active lifestyle PT Goal Formulation: With patient Time For Goal Achievement: 12/31/13 Potential to Achieve Goals: Good Progress towards PT goals: Progressing toward goals    Frequency  7X/week    PT Plan Current plan remains appropriate    Co-evaluation             End of Session Equipment Utilized During Treatment: Gait belt Activity Tolerance: Patient tolerated treatment well Patient left: in bed;in CPM;with call bell/phone within reach     Time: 1447-1526 PT Time Calculation (min): 39 min  Charges:  $Gait Training: 23-37 mins $Therapeutic Activity: 8-22  mins                    G Codes:      Roney Marion [facility] 12/25/2013, 4:16 PM  Roney Marion, [place]  Acute Rehabilitation Services Pager [phone] Office [phone]

## 2013-12-25 NOTE — Progress Notes (Signed)
Patient ID: Madison Collins, female   DOB: 02-11-1951, 63 y.o.   MRN: [id number] PATIENT ID: Madison Collins  MRN: [id number]  DOB/AGE:  1951-01-10 / 63 y.o.  1 Day Post-Op Procedure(s) (LRB): RIGHT TOTAL KNEE ARTHROPLASTY (Right)    PROGRESS NOTE Subjective: Patient is alert, oriented, x1 Nausea, no Vomiting, yes passing gas, no Bowel Movement. Taking PO well. Denies SOB, Chest or Calf Pain. Using Incentive Spirometer, PAS in place. Ambulate to BR x 2, CPM 0-40 Patient reports pain as 8 on 0-10 scale  .    Objective: Vital signs in last 24 hours: Filed Vitals:   12/25/13 0000 12/25/13 0106 12/25/13 0244 12/25/13 0557  BP:  142/58  129/55  Pulse:  74  81  Temp:  98.2 F (36.8 C) 100.3 F (37.9 C) 99.2 F (37.3 C)  TempSrc:      Resp: 18 18  18   SpO2:  98%  95%      Intake/Output from previous day: I/O last 3 completed shifts: In: 0102 [P.O.:720; I.V.:950] Out: 350 [Urine:250; Drains:50; Blood:50]   Intake/Output this shift: Total I/O In: 1300 [P.O.:1300] Out: 50 [Drains:50]   LABORATORY DATA: No results for input(s): WBC, HGB, HCT, PLT, NA, K, CL, CO2, BUN, CREATININE, GLUCOSE, GLUCAP, INR, CALCIUM in the last 72 hours.  Invalid input(s): PT, 2  Examination: Neurologically intact ABD soft Neurovascular intact Sensation intact distally Intact pulses distally Dorsiflexion/Plantar flexion intact Incision: dressing C/D/I No cellulitis present Compartment soft} Blood and plasma separated in drain indicating minimal recent drainage, drain pulled without difficulty.  Assessment:   1 Day Post-Op Procedure(s) (LRB): RIGHT TOTAL KNEE ARTHROPLASTY (Right) ADDITIONAL DIAGNOSIS: Expected Acute Blood Loss Anemia, Hypertension, chronic pain, GERD,Depression  Plan: PT/OT WBAT, CPM 5/hrs day until ROM 0-90 degrees, then D/C CPM DVT Prophylaxis:  SCDx72hrs, ASA 325 mg BID x 2 weeks DISCHARGE PLAN: Home DISCHARGE NEEDS: HHPT, HHRN, CPM, Walker and 3-in-1 comode seat    Zurie Platas J 12/25/2013, 6:55 AM

## 2013-12-25 NOTE — Plan of Care (Signed)
Problem: Phase I Progression Outcomes Goal: Pain controlled with appropriate interventions Outcome: Completed/Met Date Met:  12/25/13 Goal: Initial discharge plan identified Outcome: Completed/Met Date Met:  12/25/13  Problem: Phase II Progression Outcomes Goal: Tolerating diet Outcome: Completed/Met Date Met:  12/25/13 Goal: Discharge plan established Outcome: Completed/Met Date Met:  12/25/13

## 2013-12-25 NOTE — Progress Notes (Signed)
Physical Therapy Treatment Patient Details Name: Madison Collins MRN: [id number] DOB: 09/29/1950 Today's Date: 12/25/2013    History of Present Illness Patient is a 63 yo female admitted 12/24/13 now s/p Rt TKA.  PMH:  HTN, Hep C, Migraines, Depression, Anxiety, Cervical disc syndrome.    PT Comments    Noting quite good progress with functional mobility despite significant pain limiting knee flexion range of motion; On track for dc home (though pt is not confident about dc home today)  Follow Up Recommendations  Home health PT;Supervision - Intermittent     Equipment Recommendations  None recommended by PT    Recommendations for Other Services       Precautions / Restrictions Precautions Precautions: Knee Restrictions RLE Weight Bearing: Weight bearing as tolerated    Mobility  Bed Mobility Overal bed mobility: Needs Assistance Bed Mobility: Supine to Sit     Supine to sit: Min assist     General bed mobility comments: assist for RLE coming off of bed due to lots of pain  Transfers Overall transfer level: Needs assistance Equipment used: Rolling walker (2 wheeled) Transfers: Sit to/from Stand Sit to Stand: Min guard         General transfer comment: cues for technique.  Ambulation/Gait Ambulation/Gait assistance: Min guard;Supervision Ambulation Distance (Feet): 75 Feet Assistive device: Rolling walker (2 wheeled) Gait Pattern/deviations: Step-through pattern (emerging step-through pattern) Gait velocity: slowed   General Gait Details: Cues for gait sequence, and to work toward step-through pattern for more efficient gait; progressed to supervision    Stairs            Wheelchair Mobility    Modified Rankin (Stroke Patients Only)       Balance                                    Cognition Arousal/Alertness: Awake/alert Behavior During Therapy: WFL for tasks assessed/performed Overall Cognitive Status: Within Functional  Limits for tasks assessed                      Exercises Total Joint Exercises Ankle Circles/Pumps: AROM;Both;10 reps;Supine Quad Sets: AROM;Right;10 reps Heel Slides: AAROM;Right;5 reps (significant'y limited by pain) Straight Leg Raises: AAROM;Right;10 reps Goniometric ROM: 0-20 deg; significantly limited by pain    General Comments        Pertinent Vitals/Pain Pain Assessment: 0-10 Pain Score: 10-Worst pain ever Pain Location: R knee Pain Descriptors / Indicators: Aching;Grimacing;Guarding Pain Intervention(s): Patient requesting pain meds-RN notified    Home Living                      Prior Function            PT Goals (current goals can now be found in the care plan section) Acute Rehab PT Goals Patient Stated Goal: not stated PT Goal Formulation: With patient Time For Goal Achievement: 12/31/13 Potential to Achieve Goals: Good Progress towards PT goals: Progressing toward goals    Frequency  7X/week    PT Plan Current plan remains appropriate    Co-evaluation             End of Session Equipment Utilized During Treatment: Gait belt Activity Tolerance: Patient limited by pain Patient left: in chair;with call bell/phone within reach;with nursing/sitter in room     Time: 0926-1000 PT Time Calculation (min): 34 min  Charges:  $Gait Training:  8-22 mins $Therapeutic Exercise: 8-22 mins                    G Codes:      Madison Collins [facility] 12/25/2013, 12:13 PM  Madison Collins, [place]  Acute Rehabilitation Services Pager [phone] Office [phone]

## 2013-12-26 LAB — CBC
HEMATOCRIT: 30.5 % — AB (ref 36.0–46.0)
Hemoglobin: 10.3 g/dL — ABNORMAL LOW (ref 12.0–15.0)
MCH: 29.3 pg (ref 26.0–34.0)
MCHC: 33.8 g/dL (ref 30.0–36.0)
MCV: 86.9 fL (ref 78.0–100.0)
Platelets: 246 10*3/uL (ref 150–400)
RBC: 3.51 MIL/uL — ABNORMAL LOW (ref 3.87–5.11)
RDW: 12.8 % (ref 11.5–15.5)
WBC: 11.6 10*3/uL — ABNORMAL HIGH (ref 4.0–10.5)

## 2013-12-26 MED ORDER — PNEUMOCOCCAL VAC POLYVALENT 25 MCG/0.5ML IJ INJ
0.5000 mL | INJECTION | INTRAMUSCULAR | Status: AC
  Administered 2013-12-26: 0.5 mL via INTRAMUSCULAR

## 2013-12-26 NOTE — Care Management Note (Signed)
CARE MANAGEMENT NOTE 12/26/2013  Patient:  Madison Collins, Madison Collins   Account Number:  [id number]  Date Initiated:  12/25/2013  Documentation initiated by:  Ricki Miller  Subjective/Objective Assessment:   63 yr old female admitted with right knee arthritis. Patient had a right total knee arthroplasty.     Action/Plan:   Patient was preoperatively setup with [place], no changes. Has family support at discharge.   Anticipated DC Date:  12/26/2013   Anticipated DC Plan:  [place]  CM consult      [facility] Choice  HOME HEALTH  DURABLE MEDICAL EQUIPMENT   Choice offered to / List presented to:  C-1 Patient   DME arranged  [employer]  [facility]  3-N-1      DME agency  [employer]     HH arranged  HH-2 PT      Juana Diaz.   Status of service:  Completed, signed off Medicare Important Message given?   (If response is "NO", the following Medicare IM given date fields will be blank) Date Medicare IM given:   Medicare IM given by:   Date Additional Medicare IM given:   Additional Medicare IM given by:    Discharge Disposition:  [place]  Per UR Regulation:  Reviewed for med. necessity/level of care/duration of stay

## 2013-12-26 NOTE — Discharge Summary (Signed)
Patient ID: SINCERITY CEDAR MRN: [id number] DOB/AGE: Jul 07, 1950 63 y.o.  Admit date: 12/24/2013 Discharge date: 12/26/2013  Admission Diagnoses:  Principal Problem:   Arthritis of knee, right Active Problems:   Arthritis of knee   Discharge Diagnoses:  Same  Past Medical History  Diagnosis Date  . Hepatitis C     IA viral load low 02/09    BX past mild fibrosis  . Hypertension   . Insomnia, unspecified   . Knee pain   . Anxiety and depression   . Bulging disc 1990's    c4-5  . Post-menopause 2010  . GERD (gastroesophageal reflux disease)   . H/O: osteoarthritis   . Arthritis   . Depression   . Cataract     right eye  . Anxiety   . Headache     migraines occasionally    Surgeries: Procedure(s): RIGHT TOTAL KNEE ARTHROPLASTY on 12/24/2013   Consultants:    Discharged Condition: Improved  Hospital Course: BRISSA ASANTE is an 63 y.o. female who was admitted 12/24/2013 for operative treatment ofArthritis of knee, right. Patient has severe unremitting pain that affects sleep, daily activities, and work/hobbies. After pre-op clearance the patient was taken to the operating room on 12/24/2013 and underwent  Procedure(s): RIGHT TOTAL KNEE ARTHROPLASTY.    Patient was given perioperative antibiotics: Anti-infectives    Start     Dose/Rate Route Frequency Ordered Stop   12/24/13 0803  cefUROXime (ZINACEF) injection  Status:  Discontinued       As needed 12/24/13 0803 12/24/13 0912   12/24/13 0600  ceFAZolin (ANCEF) IVPB 2 g/50 mL premix     2 g100 mL/hr over 30 Minutes Intravenous On call to O.R. 12/23/13 1329 12/24/13 0750       Patient was given sequential compression devices, early ambulation, and chemoprophylaxis to prevent DVT.  Patient benefited maximally from hospital stay and there were no complications.    Recent vital signs: Patient Vitals for the past 24 hrs:  BP Temp Temp src Pulse Resp SpO2  12/26/13 0759 - - - - 16 95 %  12/26/13 0452 135/60 mmHg  99.9 F (37.7 C) Oral 83 16 95 %  12/25/13 2021 (!) 135/53 mmHg 98.4 F (36.9 C) Oral 76 17 96 %  12/25/13 1433 (!) 107/56 mmHg 98.4 F (36.9 C) Oral 71 18 96 %     Recent laboratory studies:  Recent Labs  12/25/13 0706 12/26/13 0549  WBC 12.2* 11.6*  HGB 10.9* 10.3*  HCT 32.5* 30.5*  PLT 270 246  NA 134*  --   K 4.0  --   CL 95*  --   CO2 23  --   BUN 7  --   CREATININE 0.61  --   GLUCOSE 149*  --   CALCIUM 8.8  --      Discharge Medications:     Medication List    STOP taking these medications        HYDROcodone-acetaminophen 10-325 MG per tablet  Commonly known as:  NORCO      TAKE these medications        ALPRAZolam 0.5 MG tablet  Commonly known as:  XANAX  Take 1 tablet (0.5 mg total) by mouth 3 (three) times daily as needed for anxiety.     aspirin EC 325 MG tablet  Take 1 tablet (325 mg total) by mouth 2 (two) times daily.     CELEBREX PO  Take by mouth daily.     celecoxib  200 MG capsule  Commonly known as:  CELEBREX  Take 200 mg by mouth 2 (two) times daily.     citalopram 40 MG tablet  Commonly known as:  CELEXA  Take 1 tablet (40 mg total) by mouth every morning.     CLARITIN 10 MG tablet  Generic drug:  loratadine  Take 10 mg by mouth daily as needed for allergies.     fluocinonide-emollient 0.05 % cream  Commonly known as:  LIDEX-E  Apply topically 2 (two) times daily. Foot dermatitis     hydrochlorothiazide 12.5 MG capsule  Commonly known as:  MICROZIDE  Take 1 capsule (12.5 mg total) by mouth every morning.     methocarbamol 500 MG tablet  Commonly known as:  ROBAXIN  Take 1 tablet (500 mg total) by mouth 2 (two) times daily with a meal.     omeprazole 20 MG capsule  Commonly known as:  PRILOSEC  Take 1 capsule (20 mg total) by mouth every morning.     oxyCODONE-acetaminophen 5-325 MG per tablet  Commonly known as:  PERCOCET/ROXICET  Take 1-2 tablets by mouth every 6 (six) hours as needed for severe pain (pain).      penicillin v potassium 500 MG tablet  Commonly known as:  VEETID  Take 500 mg by mouth 4 (four) times daily -  before meals and at bedtime.     SUMAtriptan 100 MG tablet  Commonly known as:  IMITREX  Take 1 tablet (100 mg total) by mouth every 2 (two) hours as needed for migraine or headache. May repeat in 2 hours if headache persists or recurs.     temazepam 30 MG capsule  Commonly known as:  RESTORIL  Take 1 capsule (30 mg total) by mouth at bedtime as needed for sleep.        Diagnostic Studies: No results found.  Disposition: 01-Home or Self Care      Discharge Instructions    CPM    Complete by:  As directed   Continuous passive motion machine (CPM):      Use the CPM from 0 to 60  for 5 hours per day.      You may increase by 10 degrees per day.  You may break it up into 2 or 3 sessions per day.      Use CPM for 2 weeks or until you are told to stop.     Call MD / Call 911    Complete by:  As directed   If you experience chest pain or shortness of breath, CALL 911 and be transported to the hospital emergency room.  If you develope a fever above 101 F, pus (white drainage) or increased drainage or redness at the wound, or calf pain, call your surgeon's office.     Change dressing    Complete by:  As directed   Change dressing on 5, then change the dressing daily with sterile 4 x 4 inch gauze dressing and apply TED hose.  You may clean the incision with alcohol prior to redressing.     Constipation Prevention    Complete by:  As directed   Drink plenty of fluids.  Prune juice may be helpful.  You may use a stool softener, such as Colace (over the counter) 100 mg twice a day.  Use MiraLax (over the counter) for constipation as needed.     Diet - low sodium heart healthy    Complete by:  As directed  Discharge instructions    Complete by:  As directed   Follow up in office with Dr. Mayer Camel in 2 weeks.     Driving restrictions    Complete by:  As directed   No driving for  2 weeks     Increase activity slowly as tolerated    Complete by:  As directed      Patient may shower    Complete by:  As directed   You may shower without a dressing once there is no drainage.  Do not wash over the wound.  If drainage remains, cover wound with plastic wrap and then shower.           Follow-up Information    Follow up with Kerin Salen, MD In 2 weeks.   Specialty:  Orthopedic Surgery   Contact information:   [place] [id number] [phone]        Signed: Hardin Negus, Giavonni Fonder R 12/26/2013, 8:21 AM

## 2013-12-26 NOTE — Progress Notes (Signed)
Physical Therapy Treatment Patient Details Name: Madison Collins MRN: [id number] DOB: 29-Feb-1950 Today's Date: 12/26/2013    History of Present Illness Patient is a 63 yo female admitted 12/24/13 now s/p Rt TKA.  PMH:  HTN, Hep C, Migraines, Depression, Anxiety, Cervical disc syndrome.    PT Comments    Quite good progress, especially with Gait smoothness and efficiency; OK for dc home from PT standpoint   Follow Up Recommendations  Home health PT;Supervision - Intermittent     Equipment Recommendations  None recommended by PT    Recommendations for Other Services       Precautions / Restrictions Precautions Precautions: Knee Precaution Comments: Pt educated to not allow any pillow or bolster under knee for healing with optimal range of motion.  Restrictions Weight Bearing Restrictions: Yes RLE Weight Bearing: Weight bearing as tolerated    Mobility  Bed Mobility Overal bed mobility: Needs Assistance Bed Mobility: Supine to Sit;Sit to Supine     Supine to sit: Min guard Sit to supine: Min guard   General bed mobility comments: Able to get in and out of bed without physical assist; Uses LLE to assist RLE  Transfers Overall transfer level: Needs assistance Equipment used: Rolling walker (2 wheeled) Transfers: Sit to/from Stand Sit to Stand: Supervision         General transfer comment: cues for technique.  Ambulation/Gait Ambulation/Gait assistance: Supervision Ambulation Distance (Feet): 250 Feet Assistive device: Rolling walker (2 wheeled) Gait Pattern/deviations: Step-through pattern Gait velocity: slowed, but improving with more efficient gait pattern   General Gait Details: Much more efficient gati with pt able to focus on smoothness and step-through pattern; Nice, stable knee in stance   Stairs            Wheelchair Mobility    Modified Rankin (Stroke Patients Only)       Balance                                     Cognition Arousal/Alertness: Awake/alert Behavior During Therapy: WFL for tasks assessed/performed Overall Cognitive Status: Within Functional Limits for tasks assessed                      Exercises Total Joint Exercises Ankle Circles/Pumps: AROM;Both;10 reps;Supine Quad Sets: AROM;Right;10 reps Heel Slides: AAROM;Right;10 reps Straight Leg Raises: AAROM;Right;10 reps Goniometric ROM: 0-35    General Comments        Pertinent Vitals/Pain      Home Living                      Prior Function            PT Goals (current goals can now be found in the care plan section) Acute Rehab PT Goals Patient Stated Goal: wants to get back to active lifestyle PT Goal Formulation: With patient Time For Goal Achievement: 12/31/13 Potential to Achieve Goals: Good Progress towards PT goals: Progressing toward goals    Frequency  7X/week    PT Plan Current plan remains appropriate    Co-evaluation             End of Session Equipment Utilized During Treatment: Gait belt Activity Tolerance: Patient tolerated treatment well Patient left: in bed;in CPM;with call bell/phone within reach     Time: 0922-1002 PT Time Calculation (min): 40 min  Charges:  $Gait Training: 8-22 mins $Therapeutic Exercise:  8-22 mins $Therapeutic Activity: 8-22 mins                    G Codes:      Roney Marion [facility] 12/26/2013, 11:36 AM  Roney Marion, PT  Acute Rehabilitation Services Pager [phone] Office [phone]

## 2013-12-26 NOTE — Progress Notes (Signed)
PATIENT ID: Madison Collins  MRN: [id number]  DOB/AGE:  09/24/1950 / 63 y.o.  2 Days Post-Op Procedure(s) (LRB): RIGHT TOTAL KNEE ARTHROPLASTY (Right)    PROGRESS NOTE Subjective: Patient is alert, oriented, no Nausea, no Vomiting, yes passing gas, no Bowel Movement. Taking PO well. Denies SOB, Chest or Calf Pain. Using Incentive Spirometer, PAS in place. Ambulate WBAT, CPM 0-60 Patient reports pain as 8 on 0-10 scale  .    Objective: Vital signs in last 24 hours: Filed Vitals:   12/25/13 1433 12/25/13 2021 12/26/13 0452 12/26/13 0759  BP: 107/56 135/53 135/60   Pulse: 71 76 83   Temp: 98.4 F (36.9 C) 98.4 F (36.9 C) 99.9 F (37.7 C)   TempSrc: Oral Oral Oral   Resp: '18 17 16 16  ' SpO2: 96% 96% 95% 95%      Intake/Output from previous day: I/O last 3 completed shifts: In: 9191 [P.O.:1780; I.V.:1375] Out: 50 [Drains:50]   Intake/Output this shift:     LABORATORY DATA:  Recent Labs  12/25/13 0706 12/26/13 0549  WBC 12.2* 11.6*  HGB 10.9* 10.3*  HCT 32.5* 30.5*  PLT 270 246  NA 134*  --   K 4.0  --   CL 95*  --   CO2 23  --   BUN 7  --   CREATININE 0.61  --   GLUCOSE 149*  --   CALCIUM 8.8  --     Examination: Neurologically intact Neurovascular intact Sensation intact distally Intact pulses distally Dorsiflexion/Plantar flexion intact Incision: dressing C/D/I No cellulitis present Compartment soft}  Assessment:   2 Days Post-Op Procedure(s) (LRB): RIGHT TOTAL KNEE ARTHROPLASTY (Right) ADDITIONAL DIAGNOSIS: Expected Acute Blood Loss Anemia, Hypertension and Hep C, migraines, depression, anxiety  Plan: PT/OT WBAT, CPM 5/hrs day until ROM 0-90 degrees, then D/C CPM DVT Prophylaxis:  SCDx72hrs, ASA 325 mg BID x 2 weeks DISCHARGE PLAN: Home, today after she has met PT goals. DISCHARGE NEEDS: HHPT, HHRN, CPM, Walker and 3-in-1 comode seat     [place] R 12/26/2013, 8:16 AM

## 2013-12-26 NOTE — Plan of Care (Signed)
Problem: Phase I Progression Outcomes Goal: Other Phase I Outcomes/Goals Outcome: Completed/Met Date Met:  12/26/13  Problem: Phase II Progression Outcomes Goal: Ambulates Outcome: Completed/Met Date Met:  12/26/13 Goal: Other Phase II Outcomes/Goals Outcome: Completed/Met Date Met:  12/26/13  Problem: Phase III Progression Outcomes Goal: Pain controlled on oral analgesia Outcome: Completed/Met Date Met:  12/26/13 Goal: Ambulates Outcome: Completed/Met Date Met:  12/26/13 Goal: Incision clean - minimal/no drainage Outcome: Completed/Met Date Met:  12/26/13 Goal: Discharge plan remains appropriate-arrangements made Outcome: Completed/Met Date Met:  12/26/13 Goal: Anticoagulant follow-up in place Outcome: Completed/Met Date Met:  12/26/13 Goal: Other Phase III Outcomes/Goals Outcome: Completed/Met Date Met:  12/26/13  Problem: Discharge Progression Outcomes Goal: Barriers To Progression Addressed/Resolved Outcome: Completed/Met Date Met:  12/26/13 Goal: CMS/Neurovascular status at or above baseline Outcome: Completed/Met Date Met:  12/26/13 Goal: Anticoagulant follow-up in place Outcome: Completed/Met Date Met:  12/26/13 Goal: Pain controlled with appropriate interventions Outcome: Completed/Met Date Met:  12/26/13 Goal: Hemodynamically stable Outcome: Completed/Met Date Met:  72/89/79 Goal: Complications resolved/controlled Outcome: Completed/Met Date Met:  12/26/13 Goal: Tolerates diet Outcome: Completed/Met Date Met:  12/26/13 Goal: Activity appropriate for discharge plan Outcome: Completed/Met Date Met:  12/26/13 Goal: Ambulates safely using assistive device Outcome: Completed/Met Date Met:  12/26/13 Goal: Follows weight - bearing limitations Outcome: Completed/Met Date Met:  12/26/13 Goal: Discharge plan in place and appropriate Outcome: Completed/Met Date Met:  12/26/13 Goal: Negotiates stairs Outcome: Completed/Met Date Met:  12/26/13 Goal: Demonstrates ADLs as  appropriate Outcome: Completed/Met Date Met:  12/26/13 Goal: Incision without S/S infection Outcome: Completed/Met Date Met:  12/26/13 Goal: Other Discharge Outcomes/Goals Outcome: Completed/Met Date Met:  12/26/13

## 2013-12-27 ENCOUNTER — Encounter (HOSPITAL_COMMUNITY): Payer: Self-pay | Admitting: Orthopedic Surgery

## 2014-01-23 ENCOUNTER — Encounter: Payer: Self-pay | Admitting: Internal Medicine

## 2014-01-23 ENCOUNTER — Ambulatory Visit (INDEPENDENT_AMBULATORY_CARE_PROVIDER_SITE_OTHER): Payer: BC Managed Care – PPO | Admitting: Internal Medicine

## 2014-01-23 VITALS — BP 125/76 | HR 87 | Temp 97.6°F | Resp 16 | Ht 61.5 in | Wt 165.0 lb

## 2014-01-23 DIAGNOSIS — B182 Chronic viral hepatitis C: Secondary | ICD-10-CM

## 2014-01-23 DIAGNOSIS — R739 Hyperglycemia, unspecified: Secondary | ICD-10-CM

## 2014-01-23 DIAGNOSIS — G4700 Insomnia, unspecified: Secondary | ICD-10-CM

## 2014-01-23 DIAGNOSIS — K219 Gastro-esophageal reflux disease without esophagitis: Secondary | ICD-10-CM

## 2014-01-23 DIAGNOSIS — I10 Essential (primary) hypertension: Secondary | ICD-10-CM

## 2014-01-23 DIAGNOSIS — Z0000 Encounter for general adult medical examination without abnormal findings: Secondary | ICD-10-CM

## 2014-01-23 DIAGNOSIS — M25569 Pain in unspecified knee: Secondary | ICD-10-CM

## 2014-01-23 DIAGNOSIS — G8929 Other chronic pain: Secondary | ICD-10-CM

## 2014-01-23 DIAGNOSIS — R5383 Other fatigue: Secondary | ICD-10-CM

## 2014-01-23 DIAGNOSIS — F418 Other specified anxiety disorders: Secondary | ICD-10-CM

## 2014-01-23 DIAGNOSIS — Z6829 Body mass index (BMI) 29.0-29.9, adult: Secondary | ICD-10-CM

## 2014-01-23 LAB — COMPREHENSIVE METABOLIC PANEL
ALT: 26 U/L (ref 0–35)
AST: 21 U/L (ref 0–37)
Albumin: 4.3 g/dL (ref 3.5–5.2)
Alkaline Phosphatase: 100 U/L (ref 39–117)
BUN: 14 mg/dL (ref 6–23)
CALCIUM: 9.6 mg/dL (ref 8.4–10.5)
CHLORIDE: 104 meq/L (ref 96–112)
CO2: 26 meq/L (ref 19–32)
Creat: 0.62 mg/dL (ref 0.50–1.10)
Glucose, Bld: 107 mg/dL — ABNORMAL HIGH (ref 70–99)
POTASSIUM: 4.2 meq/L (ref 3.5–5.3)
SODIUM: 138 meq/L (ref 135–145)
TOTAL PROTEIN: 7.5 g/dL (ref 6.0–8.3)
Total Bilirubin: 0.5 mg/dL (ref 0.2–1.2)

## 2014-01-23 LAB — LIPID PANEL
Cholesterol: 258 mg/dL — ABNORMAL HIGH (ref 0–200)
HDL: 48 mg/dL (ref >39)
LDL Cholesterol: 182 mg/dL — ABNORMAL HIGH (ref 0–99)
Total CHOL/HDL Ratio: 5.4 Ratio
Triglycerides: 142 mg/dL (ref <150)
VLDL: 28 mg/dL (ref 0–40)

## 2014-01-23 LAB — TSH: TSH: 0.827 u[IU]/mL (ref 0.350–4.500)

## 2014-01-23 LAB — POCT GLYCOSYLATED HEMOGLOBIN (HGB A1C): HEMOGLOBIN A1C: 6

## 2014-01-23 MED ORDER — TEMAZEPAM 30 MG PO CAPS: 30.0000 mg | ORAL_CAPSULE | Freq: Every evening | ORAL | Status: DC | PRN

## 2014-01-23 MED ORDER — OXYCODONE-ACETAMINOPHEN 5-325 MG PO TABS: 1.0000 | ORAL_TABLET | Freq: Four times a day (QID) | ORAL | Status: DC | PRN

## 2014-01-23 NOTE — Progress Notes (Signed)
Subjective:    Patient ID: Madison Collins, female    DOB: 19-Jul-1950, 63 y.o.   MRN: [id number]  [facility] Patient Active Problem List   Diagnosis Date Noted  . Arthritis of knee 12/24/2013  . Arthritis of knee, right 12/23/2013  . Foot dermatitis 11/08/2012  . BMI 29.0-29.9,adult 11/18/2011  . Hepatitis C virus infection without hepatic coma 05/19/2011  . Hypertension 05/19/2011  . History of migraine headaches----and frequent  05/19/2011  . Osteoarthritis 05/19/2011  . Insomnia--- temazepam helpful  05/19/2011  . Chronic knee pain----better after knee replacement  05/19/2011  . Depression with anxiety----stable /new good relationship //on Celexa  05/19/2011  . Cervical disc syndrome--- still has a lot of trouble here requiring pain medicine on some days  05/19/2011  . Gastroesophageal reflux--- stable medication  05/19/2011   [place] 8/15  dau preg again kne replacm successful-still recov Hep C good enougto delay rx 1 yr--5/16 to see Dr Linus Salmons again mammo canc due to surg---recent br absc resolved utd all vacc   Review of Systems 14 points review of systems performed is negative after items accounted for on the problem list    Objective:   Physical Exam  Constitutional: She is oriented to person, place, and time. She appears well-developed and well-nourished. No distress.  HENT:  Head: Normocephalic.  Right Ear: External ear normal.  Left Ear: External ear normal.  Nose: Nose normal.  Mouth/Throat: Oropharynx is clear and moist.  Eyes: Conjunctivae and EOM are normal. Pupils are equal, round, and reactive to light.  Neck: Normal range of motion. Neck supple. No thyromegaly present.  Cardiovascular: Normal rate, regular rhythm, normal heart sounds and intact distal pulses.   No murmur heard. Pulmonary/Chest: Effort normal and breath sounds normal. She has no wheezes.  Abdominal: Soft. Bowel sounds are normal. She exhibits no distension and no mass. There is no  tenderness. There is no rebound.  Genitourinary:  Introitus clear /vaginal vault clear/ os clear Uterus anterior to mid position and not enlarged or tender No adnexal masses  Musculoskeletal: Normal range of motion. She exhibits no edema or tenderness.  Scar on the right knee  well-healed  Lymphadenopathy:    She has no cervical adenopathy.  Neurological: She is alert and oriented to person, place, and time. She has normal reflexes. No cranial nerve deficit.  Skin: Skin is warm and dry. No rash noted.  Psychiatric: She has a normal mood and affect. Her behavior is normal. Judgment and thought content normal.  Nursing note and vitals reviewed.   Wt Readings from Last 3 Encounters:  01/23/14 165 lb (74.844 kg)  12/12/13 170 lb (77.111 kg)  10/22/13 167 lb (75.751 kg)  BP 125/76 mmHg  Pulse 87  Temp(Src) 97.6 F (36.4 C)  Resp 16  Ht 5' 1.5" (1.562 m)  Wt 165 lb (74.844 kg)  BMI 30.68 kg/m2  SpO2 97%         Assessment & Plan:  Insomnia - Plan: temazepam (RESTORIL) 30 MG capsule  Essential hypertension - Plan: Comprehensive metabolic panel, Lipid panel  Chronic hepatitis C without hepatic coma  Gastroesophageal reflux disease without esophagitis  Depression with anxiety  Chronic knee pain, unspecified laterality  BMI 29.0-29.9,adult  Hyperglycemia - Plan: POCT glycosylated hemoglobin (Hb A1C)  Other fatigue - Plan: CBC with Differential, TSH  Annual physical exam - Plan: Pap IG and HPV (high risk) DNA detection  Meds ordered this encounter  Medications  . temazepam (RESTORIL) 30 MG capsule  Sig: Take 1 capsule (30 mg total) by mouth at bedtime as needed for sleep.    Dispense:  30 capsule    Refill:  5  . oxyCODONE-acetaminophen (PERCOCET/ROXICET) 5-325 MG per tablet    Sig: Take 1-2 tablets by mouth every 6 (six) hours as needed for severe pain (pain).    Dispense:  120 tablet    Refill:  0   May call for refills on other medicines  Addendum  labs Results for orders placed or performed in visit on 01/23/14  CBC with Differential  Result Value Ref Range   WBC 6.2 4.0 - 10.5 K/uL   RBC 4.20 3.87 - 5.11 MIL/uL   Hemoglobin 12.5 12.0 - 15.0 g/dL   HCT 36.7 36.0 - 46.0 %   MCV 87.4 78.0 - 100.0 fL   MCH 29.8 26.0 - 34.0 pg   MCHC 34.1 30.0 - 36.0 g/dL   RDW 13.6 11.5 - 15.5 %   Platelets 281 150 - 400 K/uL   MPV 10.0 9.4 - 12.4 fL   Neutrophils Relative % 57 43 - 77 %   Neutro Abs 3.5 1.7 - 7.7 K/uL   Lymphocytes Relative 33 12 - 46 %   Lymphs Abs 2.0 0.7 - 4.0 K/uL   Monocytes Relative 8 3 - 12 %   Monocytes Absolute 0.5 0.1 - 1.0 K/uL   Eosinophils Relative 2 0 - 5 %   Eosinophils Absolute 0.1 0.0 - 0.7 K/uL   Basophils Relative 0 0 - 1 %   Basophils Absolute 0.0 0.0 - 0.1 K/uL   Smear Review Criteria for review not met   Comprehensive metabolic panel  Result Value Ref Range   Sodium 138 135 - 145 mEq/L   Potassium 4.2 3.5 - 5.3 mEq/L   Chloride 104 96 - 112 mEq/L   CO2 26 19 - 32 mEq/L   Glucose, Bld 107 (H) 70 - 99 mg/dL   BUN 14 6 - 23 mg/dL   Creat 0.62 0.50 - 1.10 mg/dL   Total Bilirubin 0.5 0.2 - 1.2 mg/dL   Alkaline Phosphatase 100 39 - 117 U/L   AST 21 0 - 37 U/L   ALT 26 0 - 35 U/L   Total Protein 7.5 6.0 - 8.3 g/dL   Albumin 4.3 3.5 - 5.2 g/dL   Calcium 9.6 8.4 - 10.5 mg/dL  Lipid panel  Result Value Ref Range   Cholesterol 258 (H) 0 - 200 mg/dL   Triglycerides 142 <150 mg/dL   HDL 48 >39 mg/dL   Total CHOL/HDL Ratio 5.4 Ratio   VLDL 28 0 - 40 mg/dL   LDL Cholesterol 182 (H) 0 - 99 mg/dL  TSH  Result Value Ref Range   TSH 0.827 0.350 - 4.500 uIU/mL  POCT glycosylated hemoglobin (Hb A1C)  Result Value Ref Range   Hemoglobin A1C 6.0   Pap IG and HPV (high risk) DNA detection  Result Value Ref Range   HPV DNA High Risk Not Detected    Specimen adequacy: SEE NOTE    FINAL DIAGNOSIS: SEE NOTE    Cytotechnologist: SEE NOTE    Will start Lipitor

## 2014-01-24 LAB — CBC WITH DIFFERENTIAL/PLATELET
Basophils Absolute: 0 10*3/uL (ref 0.0–0.1)
Basophils Relative: 0 % (ref 0–1)
Eosinophils Absolute: 0.1 10*3/uL (ref 0.0–0.7)
Eosinophils Relative: 2 % (ref 0–5)
HEMATOCRIT: 36.7 % (ref 36.0–46.0)
Hemoglobin: 12.5 g/dL (ref 12.0–15.0)
LYMPHS PCT: 33 % (ref 12–46)
Lymphs Abs: 2 10*3/uL (ref 0.7–4.0)
MCH: 29.8 pg (ref 26.0–34.0)
MCHC: 34.1 g/dL (ref 30.0–36.0)
MCV: 87.4 fL (ref 78.0–100.0)
MONOS PCT: 8 % (ref 3–12)
MPV: 10 fL (ref 9.4–12.4)
Monocytes Absolute: 0.5 10*3/uL (ref 0.1–1.0)
NEUTROS ABS: 3.5 10*3/uL (ref 1.7–7.7)
Neutrophils Relative %: 57 % (ref 43–77)
Platelets: 281 10*3/uL (ref 150–400)
RBC: 4.2 MIL/uL (ref 3.87–5.11)
RDW: 13.6 % (ref 11.5–15.5)
WBC: 6.2 10*3/uL (ref 4.0–10.5)

## 2014-01-25 LAB — PAP IG AND HPV HIGH-RISK: HPV DNA HIGH RISK: NOT DETECTED

## 2014-01-26 ENCOUNTER — Encounter: Payer: Self-pay | Admitting: Internal Medicine

## 2014-01-29 MED ORDER — ATORVASTATIN CALCIUM 20 MG PO TABS: 20.0000 mg | ORAL_TABLET | Freq: Every day | ORAL | Status: DC

## 2014-01-30 ENCOUNTER — Other Ambulatory Visit: Payer: Self-pay | Admitting: Radiology

## 2014-02-14 ENCOUNTER — Other Ambulatory Visit: Payer: Self-pay | Admitting: Internal Medicine

## 2014-03-05 ENCOUNTER — Other Ambulatory Visit: Payer: Self-pay

## 2014-03-05 MED ORDER — HYDROCODONE-ACETAMINOPHEN 10-325 MG PO TABS: 1.0000 | ORAL_TABLET | Freq: Four times a day (QID) | ORAL | Status: DC | PRN

## 2014-03-05 NOTE — Telephone Encounter (Signed)
Pended.

## 2014-03-05 NOTE — Telephone Encounter (Signed)
Pt requesting refill on [place], did not see this in med list. Please advise

## 2014-03-06 NOTE — Telephone Encounter (Signed)
Notified pt ready. 

## 2014-04-07 ENCOUNTER — Other Ambulatory Visit: Payer: Self-pay

## 2014-04-07 NOTE — Telephone Encounter (Signed)
Pt called to request a refill of her [place].

## 2014-04-09 MED ORDER — HYDROCODONE-ACETAMINOPHEN 10-325 MG PO TABS: 1.0000 | ORAL_TABLET | Freq: Four times a day (QID) | ORAL | Status: DC | PRN

## 2014-04-09 NOTE — Telephone Encounter (Signed)
Pt called to check on status of her Norco refill.

## 2014-04-10 NOTE — Telephone Encounter (Signed)
Notified pt ready. 

## 2014-05-06 ENCOUNTER — Other Ambulatory Visit: Payer: Self-pay

## 2014-05-06 ENCOUNTER — Other Ambulatory Visit: Payer: Self-pay | Admitting: Internal Medicine

## 2014-05-06 MED ORDER — HYDROCODONE-ACETAMINOPHEN 10-325 MG PO TABS: 1.0000 | ORAL_TABLET | Freq: Four times a day (QID) | ORAL | Status: DC | PRN

## 2014-05-06 NOTE — Telephone Encounter (Signed)
Pt would like on refill on HYDROcodone-acetaminophen (NORCO) 10-325 MG per tablet [[id number]]. Please advise at [phone]

## 2014-05-06 NOTE — Telephone Encounter (Signed)
Ready

## 2014-05-07 NOTE — Telephone Encounter (Signed)
Notified pt ready. 

## 2014-05-07 NOTE — Telephone Encounter (Signed)
Faxed

## 2014-06-04 ENCOUNTER — Telehealth: Payer: Self-pay

## 2014-06-04 MED ORDER — HYDROCODONE-ACETAMINOPHEN 10-325 MG PO TABS: 1.0000 | ORAL_TABLET | Freq: Four times a day (QID) | ORAL | Status: DC | PRN

## 2014-06-04 MED ORDER — ALPRAZOLAM 0.5 MG PO TABS: 0.5000 mg | ORAL_TABLET | Freq: Three times a day (TID) | ORAL | Status: DC | PRN

## 2014-06-04 NOTE — Telephone Encounter (Signed)
Called [facility] ready to pick up.

## 2014-06-04 NOTE — Telephone Encounter (Signed)
Patient is requesting medication refills for norco and xanax sent to [facility] on [address]. Please call when medication is ready for pick up! [phone]

## 2014-06-04 NOTE — Telephone Encounter (Signed)
Meds ordered this encounter  Medications  . HYDROcodone-acetaminophen (NORCO) 10-325 MG per tablet    Sig: Take 1 tablet by mouth every 6 (six) hours as needed.    Dispense:  120 tablet    Refill:  0  . ALPRAZolam (XANAX) 0.5 MG tablet    Sig: Take 1 tablet (0.5 mg total) by mouth 3 (three) times daily as needed. for anxiety    Dispense:  90 tablet    Refill:  2    No refills available for controlled drug  . HYDROcodone-acetaminophen (NORCO) 10-325 MG per tablet    Sig: Take 1 tablet by mouth every 6 (six) hours as needed. For after 30d from signing date    Dispense:  120 tablet    Refill:  0  . HYDROcodone-acetaminophen (NORCO) 10-325 MG per tablet    Sig: Take 1 tablet by mouth every 6 (six) hours as needed. For after 60d from signing date    Dispense:  120 tablet    Refill:  0   F/u late June,early july

## 2014-07-31 ENCOUNTER — Encounter: Payer: Self-pay | Admitting: Internal Medicine

## 2014-07-31 ENCOUNTER — Ambulatory Visit (INDEPENDENT_AMBULATORY_CARE_PROVIDER_SITE_OTHER): Payer: BLUE CROSS/BLUE SHIELD | Admitting: Internal Medicine

## 2014-07-31 VITALS — BP 150/73 | HR 69 | Temp 98.3°F | Resp 16 | Ht 61.25 in | Wt 162.6 lb

## 2014-07-31 DIAGNOSIS — F418 Other specified anxiety disorders: Secondary | ICD-10-CM

## 2014-07-31 DIAGNOSIS — G8929 Other chronic pain: Secondary | ICD-10-CM

## 2014-07-31 DIAGNOSIS — M25569 Pain in unspecified knee: Secondary | ICD-10-CM

## 2014-07-31 DIAGNOSIS — M1711 Unilateral primary osteoarthritis, right knee: Secondary | ICD-10-CM

## 2014-07-31 DIAGNOSIS — G4700 Insomnia, unspecified: Secondary | ICD-10-CM

## 2014-07-31 DIAGNOSIS — I10 Essential (primary) hypertension: Secondary | ICD-10-CM

## 2014-07-31 DIAGNOSIS — M129 Arthropathy, unspecified: Secondary | ICD-10-CM | POA: Diagnosis not present

## 2014-07-31 MED ORDER — CITALOPRAM HYDROBROMIDE 40 MG PO TABS: 40.0000 mg | ORAL_TABLET | Freq: Every morning | ORAL | Status: DC

## 2014-07-31 MED ORDER — ALPRAZOLAM 0.5 MG PO TABS: 0.5000 mg | ORAL_TABLET | Freq: Three times a day (TID) | ORAL | Status: DC | PRN

## 2014-07-31 MED ORDER — HYDROCODONE-ACETAMINOPHEN 10-325 MG PO TABS: 1.0000 | ORAL_TABLET | Freq: Four times a day (QID) | ORAL | Status: DC | PRN

## 2014-07-31 MED ORDER — MELOXICAM 15 MG PO TABS: 15.0000 mg | ORAL_TABLET | Freq: Every day | ORAL | Status: DC

## 2014-07-31 MED ORDER — HYDROCHLOROTHIAZIDE 12.5 MG PO CAPS: 12.5000 mg | ORAL_CAPSULE | Freq: Every morning | ORAL | Status: DC

## 2014-07-31 MED ORDER — TEMAZEPAM 30 MG PO CAPS: 30.0000 mg | ORAL_CAPSULE | Freq: Every evening | ORAL | Status: DC | PRN

## 2014-07-31 NOTE — Progress Notes (Signed)
Subjective:    Patient ID: Madison Collins, female    DOB: 04/16/1950, 64 y.o.   MRN: [id number]  HPIf/u Patient Active Problem List   Diagnosis Date Noted  . Arthritis of knee 12/24/2013  . Arthritis of knee, right 12/23/2013  . Foot dermatitis 11/08/2012  . BMI 29.0-29.9,adult 11/18/2011  . Hepatitis C virus infection without hepatic coma 05/19/2011  . Hypertension 05/19/2011  . History of migraine headaches 05/19/2011  . Osteoarthritis 05/19/2011  . Insomnia 05/19/2011  . Chronic knee pain 05/19/2011  . Depression with anxiety 05/19/2011  . Cervical disc syndrome 05/19/2011  . Gastroesophageal reflux 05/19/2011   Needs med refills Daughter delivered 3rd or 4th child--Pam has custody due to DSS issues Almost broke caring for daughetr's kids and daugh. Looking for new job 4+stress--meds helping Stopped statins due to "taste"--not time to reconsider Hep c thought stable--no rx now    Review of Systems noncontr    Objective:   Physical Exam BP 150/73 mmHg  Pulse 69  Temp(Src) 98.3 F (36.8 C) (Oral)  Resp 16  Ht 5' 1.25" (1.556 m)  Wt 162 lb 9.6 oz (73.755 kg)  BMI 30.46 kg/m2  SpO2 98% Out of meds for 5 d HEENT clear Heart regular No peripheral edema Cranial nerves intact Gait normal Mood is stable and affect appropriate with normal thought content and solid judgment       Assessment & Plan:  Insomnia - Plan: temazepam (RESTORIL) 30 MG capsule  Essential hypertension  Depression with anxiety  Chronic knee pain, unspecified laterality  Arthritis of knee, right  Meds ordered this encounter  Medications  . temazepam (RESTORIL) 30 MG capsule    Sig: Take 1 capsule (30 mg total) by mouth at bedtime as needed for sleep.    Dispense:  30 capsule    Refill:  5  . ALPRAZolam (XANAX) 0.5 MG tablet    Sig: Take 1 tablet (0.5 mg total) by mouth 3 (three) times daily as needed. for anxiety    Dispense:  90 tablet    Refill:  2    No refills  available for controlled drug  . hydrochlorothiazide (MICROZIDE) 12.5 MG capsule    Sig: Take 1 capsule (12.5 mg total) by mouth every morning.    Dispense:  90 capsule    Refill:  3  . HYDROcodone-acetaminophen (NORCO) 10-325 MG per tablet    Sig: Take 1 tablet by mouth every 6 (six) hours as needed.    Dispense:  120 tablet    Refill:  0  . HYDROcodone-acetaminophen (NORCO) 10-325 MG per tablet    Sig: Take 1 tablet by mouth every 6 (six) hours as needed. For after 30d from signing date    Dispense:  120 tablet    Refill:  0  . HYDROcodone-acetaminophen (NORCO) 10-325 MG per tablet    Sig: Take 1 tablet by mouth every 6 (six) hours as needed. For after 60d from signing date    Dispense:  120 tablet    Refill:  0  . citalopram (CELEXA) 40 MG tablet    Sig: Take 1 tablet (40 mg total) by mouth every morning.    Dispense:  90 tablet    Refill:  3  . meloxicam (MOBIC) 15 MG tablet    Sig: Take 1 tablet (15 mg total) by mouth daily.    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

## 2014-08-14 ENCOUNTER — Other Ambulatory Visit: Payer: Self-pay | Admitting: Pharmacist Clinician (PhC)/ Clinical Pharmacy Specialist

## 2014-08-14 MED ORDER — LEDIPASVIR-SOFOSBUVIR 90-400 MG PO TABS: 1.0000 | ORAL_TABLET | Freq: Every day | ORAL | Status: DC

## 2014-08-19 ENCOUNTER — Telehealth: Payer: Self-pay | Admitting: *Deleted

## 2014-08-19 ENCOUNTER — Telehealth: Payer: Self-pay | Admitting: Pharmacist

## 2014-08-19 DIAGNOSIS — B182 Chronic viral hepatitis C: Secondary | ICD-10-CM

## 2014-08-19 NOTE — Telephone Encounter (Signed)
Lab orders per Onnie Boer, PharmD.  Pharmacy spoke with patient. She will come tomorrow 6/28 for labs. Landis Gandy, RN

## 2014-08-19 NOTE — Telephone Encounter (Signed)
Shenae returned the call - she is currently without insurance and is going to document all sources of income and come in tomorrow for labs. We hope to get her approved through [employer].   Thank you for allowing pharmacy to be part of this patient's care team  Nixon Kolton M. Nakeitha Milligan, Pharm.D Clinical Pharmacy Resident Pager: [phone] 08/19/2014 .10:06 AM

## 2014-08-20 ENCOUNTER — Other Ambulatory Visit: Payer: Self-pay

## 2014-08-20 DIAGNOSIS — B182 Chronic viral hepatitis C: Secondary | ICD-10-CM

## 2014-08-25 LAB — HCV RNA NS5A DRUG RESISTANCE

## 2014-09-13 ENCOUNTER — Telehealth: Payer: Self-pay

## 2014-09-13 NOTE — Telephone Encounter (Signed)
Pt is wanting to let dr Laney Pastor know that the infectious disease provider she sees has gotten it approved for her to take zepatier and she stated the medication last night

## 2014-09-30 ENCOUNTER — Other Ambulatory Visit: Payer: Self-pay

## 2014-09-30 DIAGNOSIS — B182 Chronic viral hepatitis C: Secondary | ICD-10-CM

## 2014-09-30 LAB — COMPLETE METABOLIC PANEL WITH GFR
ALBUMIN: 4.2 g/dL (ref 3.6–5.1)
ALT: 16 U/L (ref 6–29)
AST: 13 U/L (ref 10–35)
Alkaline Phosphatase: 95 U/L (ref 33–130)
BUN: 14 mg/dL (ref 7–25)
CO2: 23 mmol/L (ref 20–31)
Calcium: 9 mg/dL (ref 8.6–10.4)
Chloride: 101 mmol/L (ref 98–110)
Creat: 0.7 mg/dL (ref 0.50–0.99)
GFR, Est Non African American: >89 mL/min (ref >=60)
GLUCOSE: 92 mg/dL (ref 65–99)
POTASSIUM: 4.1 mmol/L (ref 3.5–5.3)
Sodium: 139 mmol/L (ref 135–146)
TOTAL PROTEIN: 7.1 g/dL (ref 6.1–8.1)
Total Bilirubin: 0.6 mg/dL (ref 0.2–1.2)

## 2014-09-30 LAB — CBC WITH DIFFERENTIAL/PLATELET
BASOS ABS: 0 10*3/uL (ref 0.0–0.1)
BASOS PCT: 0 % (ref 0–1)
EOS ABS: 0.2 10*3/uL (ref 0.0–0.7)
EOS PCT: 3 % (ref 0–5)
HCT: 39.3 % (ref 36.0–46.0)
HEMOGLOBIN: 13.5 g/dL (ref 12.0–15.0)
Lymphocytes Relative: 34 % (ref 12–46)
Lymphs Abs: 2.8 10*3/uL (ref 0.7–4.0)
MCH: 29.9 pg (ref 26.0–34.0)
MCHC: 34.4 g/dL (ref 30.0–36.0)
MCV: 87.1 fL (ref 78.0–100.0)
MONOS PCT: 6 % (ref 3–12)
MPV: 9.5 fL (ref 8.6–12.4)
Monocytes Absolute: 0.5 10*3/uL (ref 0.1–1.0)
NEUTROS ABS: 4.6 10*3/uL (ref 1.7–7.7)
Neutrophils Relative %: 57 % (ref 43–77)
Platelets: 261 10*3/uL (ref 150–400)
RBC: 4.51 MIL/uL (ref 3.87–5.11)
RDW: 13.8 % (ref 11.5–15.5)
WBC: 8.1 10*3/uL (ref 4.0–10.5)

## 2014-10-01 ENCOUNTER — Other Ambulatory Visit: Payer: Self-pay

## 2014-10-03 LAB — HEPATITIS C RNA QUANTITATIVE: HCV Quantitative: NOT DETECTED IU/mL (ref <15)

## 2014-10-15 ENCOUNTER — Ambulatory Visit (INDEPENDENT_AMBULATORY_CARE_PROVIDER_SITE_OTHER): Payer: Self-pay | Admitting: Internal Medicine

## 2014-10-15 ENCOUNTER — Encounter: Payer: Self-pay | Admitting: Internal Medicine

## 2014-10-15 VITALS — BP 128/81 | HR 65 | Temp 97.6°F | Ht 61.0 in | Wt 162.0 lb

## 2014-10-15 DIAGNOSIS — B182 Chronic viral hepatitis C: Secondary | ICD-10-CM

## 2014-10-15 NOTE — Assessment & Plan Note (Signed)
Doing good on treatment and happy with results.  Will repeat vl after treatment and see her after that.  I discussed SVR 12 and 24 and test of cure after SVR24 and small percent that relapse.

## 2014-10-15 NOTE — Progress Notes (Signed)
   Subjective:    Patient ID: Madison Collins, female    DOB: [birth year], 64 y.o.   MRN: [id number]  HPI  Here for follow up of hepatitis C.  Was seen one year ago and elastography F0, genotype 1a, viral load [phone] and was denied treatment by her insurance company.  She since has lost her insurance and we got her Zepatier through [place] and has been on it over 4 weeks.  Some issues with fatigue, difficulty sleeping but not enough to stop and is pleased with being on it.  No missed doses and initial viral load is now undetectable.  AST and ALT also decreased.    Review of Systems  Constitutional: Positive for fatigue. Negative for appetite change.  Gastrointestinal: Negative for nausea and diarrhea.  Skin: Negative for rash.  Neurological: Negative for dizziness and light-headedness.       Objective:   Physical Exam  Constitutional: She appears well-developed and well-nourished. No distress.  HENT:  Mouth/Throat: No oropharyngeal exudate.  Eyes: No scleral icterus.  Cardiovascular: Normal rate, regular rhythm and normal heart sounds.   No murmur heard. Pulmonary/Chest: Effort normal and breath sounds normal. No respiratory distress. She has no wheezes.  Lymphadenopathy:    She has no cervical adenopathy.          Assessment & Plan:

## 2014-11-24 ENCOUNTER — Telehealth: Payer: Self-pay

## 2014-11-24 NOTE — Telephone Encounter (Signed)
ALPRAZolam (XANAX) 0.5 MG tablet HYDROcodone-acetaminophen (NORCO) 10-325 MG per tablet   [phone]

## 2014-11-25 MED ORDER — HYDROCODONE-ACETAMINOPHEN 10-325 MG PO TABS: 1.0000 | ORAL_TABLET | Freq: Four times a day (QID) | ORAL | Status: DC | PRN

## 2014-11-25 MED ORDER — ALPRAZOLAM 0.5 MG PO TABS: 0.5000 mg | ORAL_TABLET | Freq: Three times a day (TID) | ORAL | Status: DC | PRN

## 2014-11-25 NOTE — Telephone Encounter (Signed)
Meds ordered this encounter  Medications  . ALPRAZolam (XANAX) 0.5 MG tablet    Sig: Take 1 tablet (0.5 mg total) by mouth 3 (three) times daily as needed. for anxiety    Dispense:  90 tablet    Refill:  2    No refills available for controlled drug  . HYDROcodone-acetaminophen (NORCO) 10-325 MG tablet    Sig: Take 1 tablet by mouth every 6 (six) hours as needed.    Dispense:  120 tablet    Refill:  0

## 2014-11-25 NOTE — Telephone Encounter (Signed)
Left message on pt's voicemail stating that her prescription scripts were here at the office and ready for pick up.

## 2014-12-16 ENCOUNTER — Other Ambulatory Visit: Payer: Self-pay

## 2014-12-16 NOTE — Telephone Encounter (Signed)
Refill on Norco. States that she knows she calling a bit early but she wanted to go ahead and put her request in.   [phone]

## 2014-12-17 MED ORDER — HYDROCODONE-ACETAMINOPHEN 10-325 MG PO TABS: 1.0000 | ORAL_TABLET | Freq: Four times a day (QID) | ORAL | Status: DC | PRN

## 2014-12-17 NOTE — Telephone Encounter (Signed)
Meds ordered this encounter  Medications  . HYDROcodone-acetaminophen (NORCO) 10-325 MG tablet    Sig: Take 1 tablet by mouth every 6 (six) hours as needed. To follow rx written 11/25/14    Dispense:  120 tablet    Refill:  0  . HYDROcodone-acetaminophen (NORCO) 10-325 MG tablet    Sig: Take 1 tablet by mouth every 6 (six) hours as needed. For 01/24/15 or after    Dispense:  120 tablet    Refill:  0

## 2014-12-18 NOTE — Telephone Encounter (Signed)
Notified pt ready. 

## 2014-12-19 ENCOUNTER — Ambulatory Visit: Payer: Self-pay | Admitting: Internal Medicine

## 2015-01-20 ENCOUNTER — Encounter: Payer: Self-pay | Admitting: Internal Medicine

## 2015-01-29 ENCOUNTER — Ambulatory Visit: Payer: BLUE CROSS/BLUE SHIELD | Admitting: Internal Medicine

## 2015-02-19 ENCOUNTER — Telehealth: Payer: Self-pay

## 2015-02-19 NOTE — Telephone Encounter (Signed)
Pt needs refills on xanax,Norco,going out of town and knows its A little early   Call pt at [phone]

## 2015-02-20 NOTE — Telephone Encounter (Signed)
Ok but i can,t sign til sat

## 2015-02-25 ENCOUNTER — Other Ambulatory Visit: Payer: Self-pay | Admitting: Internal Medicine

## 2015-02-25 MED ORDER — ALPRAZOLAM 0.5 MG PO TABS: 0.5000 mg | ORAL_TABLET | Freq: Three times a day (TID) | ORAL | Status: DC | PRN

## 2015-02-25 MED ORDER — HYDROCODONE-ACETAMINOPHEN 10-325 MG PO TABS: 1.0000 | ORAL_TABLET | Freq: Four times a day (QID) | ORAL | Status: DC | PRN

## 2015-02-25 NOTE — Progress Notes (Signed)
Meds ordered this encounter  Medications  . ALPRAZolam (XANAX) 0.5 MG tablet    Sig: Take 1 tablet (0.5 mg total) by mouth 3 (three) times daily as needed. for anxiety    Dispense:  90 tablet    Refill:  2  . HYDROcodone-acetaminophen (NORCO) 10-325 MG tablet    Sig: Take 1 tablet by mouth every 6 (six) hours as needed.    Dispense:  120 tablet    Refill:  0

## 2015-03-15 ENCOUNTER — Other Ambulatory Visit: Payer: Self-pay | Admitting: Internal Medicine

## 2015-03-17 NOTE — Telephone Encounter (Signed)
Meds ordered this encounter  Medications  . temazepam (RESTORIL) 30 MG capsule    Sig: TAKE 1 CAPSULE BY MOUTH AT BEDTIME AS NEEDED FOR SLEEP    Dispense:  30 capsule    Refill:  0

## 2015-03-18 ENCOUNTER — Other Ambulatory Visit: Payer: Self-pay | Admitting: Internal Medicine

## 2015-03-18 NOTE — Telephone Encounter (Signed)
Faxed

## 2015-03-22 ENCOUNTER — Telehealth: Payer: Self-pay

## 2015-03-22 NOTE — Telephone Encounter (Signed)
Pt is needing a refill on norco    Best number [phone]

## 2015-03-23 MED ORDER — HYDROCODONE-ACETAMINOPHEN 10-325 MG PO TABS: 1.0000 | ORAL_TABLET | Freq: Four times a day (QID) | ORAL | Status: DC | PRN

## 2015-03-23 NOTE — Telephone Encounter (Signed)
Meds ordered this encounter  Medications  . HYDROcodone-acetaminophen (NORCO) 10-325 MG tablet    Sig: Take 1 tablet by mouth every 6 (six) hours as needed. For 30d after signed or after    Dispense:  120 tablet    Refill:  0  . HYDROcodone-acetaminophen (NORCO) 10-325 MG tablet    Sig: Take 1 tablet by mouth every 6 (six) hours as needed.    Dispense:  120 tablet    Refill:  0

## 2015-03-24 NOTE — Telephone Encounter (Signed)
Advised Rx's ready to pick up.

## 2015-04-02 ENCOUNTER — Encounter: Payer: Self-pay | Admitting: *Deleted

## 2015-04-02 ENCOUNTER — Ambulatory Visit (INDEPENDENT_AMBULATORY_CARE_PROVIDER_SITE_OTHER): Payer: BLUE CROSS/BLUE SHIELD | Admitting: Internal Medicine

## 2015-04-02 ENCOUNTER — Encounter: Payer: Self-pay | Admitting: Internal Medicine

## 2015-04-02 VITALS — BP 137/69 | HR 69 | Temp 97.8°F | Resp 16 | Ht 61.0 in | Wt 168.0 lb

## 2015-04-02 DIAGNOSIS — G8929 Other chronic pain: Secondary | ICD-10-CM | POA: Diagnosis not present

## 2015-04-02 DIAGNOSIS — M5000 Cervical disc disorder with myelopathy, unspecified cervical region: Secondary | ICD-10-CM

## 2015-04-02 DIAGNOSIS — M25569 Pain in unspecified knee: Secondary | ICD-10-CM | POA: Diagnosis not present

## 2015-04-02 DIAGNOSIS — Z8669 Personal history of other diseases of the nervous system and sense organs: Secondary | ICD-10-CM

## 2015-04-02 DIAGNOSIS — F418 Other specified anxiety disorders: Secondary | ICD-10-CM | POA: Diagnosis not present

## 2015-04-02 DIAGNOSIS — G4700 Insomnia, unspecified: Secondary | ICD-10-CM

## 2015-04-02 DIAGNOSIS — K219 Gastro-esophageal reflux disease without esophagitis: Secondary | ICD-10-CM | POA: Diagnosis not present

## 2015-04-02 DIAGNOSIS — Z23 Encounter for immunization: Secondary | ICD-10-CM

## 2015-04-02 DIAGNOSIS — M1731 Unilateral post-traumatic osteoarthritis, right knee: Secondary | ICD-10-CM

## 2015-04-02 DIAGNOSIS — B182 Chronic viral hepatitis C: Secondary | ICD-10-CM

## 2015-04-02 DIAGNOSIS — I10 Essential (primary) hypertension: Secondary | ICD-10-CM | POA: Diagnosis not present

## 2015-04-02 DIAGNOSIS — M5010 Cervical disc disorder with radiculopathy, unspecified cervical region: Secondary | ICD-10-CM

## 2015-04-02 LAB — POCT GLYCOSYLATED HEMOGLOBIN (HGB A1C): HEMOGLOBIN A1C: 6.3

## 2015-04-02 MED ORDER — TEMAZEPAM 30 MG PO CAPS: ORAL_CAPSULE | ORAL | Status: DC

## 2015-04-02 MED ORDER — HYDROCODONE-ACETAMINOPHEN 10-325 MG PO TABS: 1.0000 | ORAL_TABLET | Freq: Four times a day (QID) | ORAL | Status: DC | PRN

## 2015-04-02 MED ORDER — OMEPRAZOLE 20 MG PO CPDR: 20.0000 mg | DELAYED_RELEASE_CAPSULE | Freq: Every morning | ORAL | Status: DC

## 2015-04-02 MED ORDER — HYDROCODONE-ACETAMINOPHEN 10-325 MG PO TABS
1.0000 | ORAL_TABLET | Freq: Three times a day (TID) | ORAL | Status: DC | PRN
Start: 2015-04-02 — End: 2015-04-06

## 2015-04-02 MED ORDER — METHOCARBAMOL 500 MG PO TABS: 500.0000 mg | ORAL_TABLET | Freq: Two times a day (BID) | ORAL | Status: DC

## 2015-04-02 MED ORDER — SUMATRIPTAN SUCCINATE 100 MG PO TABS: 100.0000 mg | ORAL_TABLET | ORAL | Status: DC | PRN

## 2015-04-02 MED ORDER — ALPRAZOLAM 0.5 MG PO TABS: 0.5000 mg | ORAL_TABLET | Freq: Three times a day (TID) | ORAL | Status: DC | PRN

## 2015-04-03 LAB — COMPREHENSIVE METABOLIC PANEL
ALBUMIN: 4.3 g/dL (ref 3.6–5.1)
ALK PHOS: 70 U/L (ref 33–130)
ALT: 11 U/L (ref 6–29)
AST: 12 U/L (ref 10–35)
BILIRUBIN TOTAL: 0.3 mg/dL (ref 0.2–1.2)
BUN: 23 mg/dL (ref 7–25)
CO2: 21 mmol/L (ref 20–31)
CREATININE: 0.67 mg/dL (ref 0.50–0.99)
Calcium: 9.4 mg/dL (ref 8.6–10.4)
Chloride: 104 mmol/L (ref 98–110)
Glucose, Bld: 92 mg/dL (ref 65–99)
Potassium: 4.1 mmol/L (ref 3.5–5.3)
SODIUM: 137 mmol/L (ref 135–146)
TOTAL PROTEIN: 7.1 g/dL (ref 6.1–8.1)

## 2015-04-03 LAB — CBC WITH DIFFERENTIAL/PLATELET
BASOS ABS: 0 10*3/uL (ref 0.0–0.1)
BASOS PCT: 0 % (ref 0–1)
Eosinophils Absolute: 0.2 10*3/uL (ref 0.0–0.7)
Eosinophils Relative: 2 % (ref 0–5)
HCT: 39 % (ref 36.0–46.0)
HEMOGLOBIN: 13.2 g/dL (ref 12.0–15.0)
LYMPHS PCT: 36 % (ref 12–46)
Lymphs Abs: 3 10*3/uL (ref 0.7–4.0)
MCH: 29.5 pg (ref 26.0–34.0)
MCHC: 33.8 g/dL (ref 30.0–36.0)
MCV: 87.2 fL (ref 78.0–100.0)
MPV: 10.3 fL (ref 8.6–12.4)
Monocytes Absolute: 0.5 10*3/uL (ref 0.1–1.0)
Monocytes Relative: 6 % (ref 3–12)
NEUTROS ABS: 4.6 10*3/uL (ref 1.7–7.7)
NEUTROS PCT: 56 % (ref 43–77)
Platelets: 253 10*3/uL (ref 150–400)
RBC: 4.47 MIL/uL (ref 3.87–5.11)
RDW: 13.7 % (ref 11.5–15.5)
WBC: 8.3 10*3/uL (ref 4.0–10.5)

## 2015-04-03 LAB — LIPID PANEL
CHOLESTEROL: 231 mg/dL — AB (ref 125–200)
HDL: 45 mg/dL — ABNORMAL LOW (ref >=46)
LDL CALC: 130 mg/dL — AB (ref <130)
TRIGLYCERIDES: 282 mg/dL — AB (ref <150)
Total CHOL/HDL Ratio: 5.1 Ratio — ABNORMAL HIGH (ref <=5.0)
VLDL: 56 mg/dL — ABNORMAL HIGH (ref <30)

## 2015-04-04 NOTE — Progress Notes (Signed)
Subjective:    Patient ID: Madison Collins, female    DOB: Feb 14, 1951, 65 y.o.   MRN: [id number]  HPIf/u Doing well Patient Active Problem List   Diagnosis Date Noted  . Arthritis of knee, right s/p surgery-rowan-still painful 12/23/2013  . BMI 29.0-29.9,adult 11/18/2011  . Hepatitis C virus infection without hepatic coma--s/p cure 2016 per dr Linus Salmons 05/19/2011  . Hypertension 05/19/2011  . History of migraine headaches--no $ for meds so toughing it out//less than weekly 05/19/2011  . Osteoarthritis 05/19/2011  . Insomnia--temaz in past 05/19/2011  . Chronic knee pain requiring pain meds to remain active 05/19/2011  . Depression with anxiety--stable on meds--daughter w/ 4 kids she doesn't care for is main source of stress//she lets them live in her beachhouse to keep a distance but has to rescue  Financially often tho she herself is barely making it 1st husb died hep c//new relat good tho he has bad DM 05/19/2011  . Cervical disc syndrome--home exercises 05/19/2011  . Gastroesophageal reflux--stable omep 05/19/2011    -  hyperlip-mild--on lip  Outpatient Encounter Prescriptions as of 04/02/2015  Medication Sig Note  . ALPRAZolam (XANAX) 0.5 MG tablet Take 1 tablet (0.5 mg total) by mouth 3 (three) times daily as needed. for anxiety   . citalopram (CELEXA) 40 MG tablet Take 1 tablet (40 mg total) by mouth every morning.   . hydrochlorothiazide (MICROZIDE) 12.5 MG capsule Take 1 capsule (12.5 mg total) by mouth every morning.   Marland Kitchen HYDROcodone-acetaminophen (NORCO) 10-325 MG tablet Take 1 tablet by mouth every 6 (six) hours as needed. For 30d after signed or after and 30d after refilled   . meloxicam (MOBIC) 15 MG tablet Take 1 tablet (15 mg total) by mouth daily.   Marland Kitchen omeprazole (PRILOSEC) 20 MG capsule Take 1 capsule (20 mg total) by mouth every morning.   . SUMAtriptan (IMITREX) 100 MG tablet Take 1 tablet (100 mg total) by mouth every 2 (two) hours as needed for migraine or headache.  May repeat in 2 hours if headache persists or recurs.   . temazepam (RESTORIL) 30 MG capsule TAKE 1 CAPSULE BY MOUTH AT BEDTIME AS NEEDED FOR SLEEP   . atorvastatin (LIPITOR) 20 MG tablet Take 1 tablet (20 mg total) by mouth daily. (Patient not taking: Reported on 10/15/2014)   . fluocinonide-emollient (LIDEX-E) 0.05 % cream APPLY TWICE DAILY TO FOOT DERMATITIS (Patient not taking: Reported on 04/02/2015)   . HYDROcodone-acetaminophen (NORCO) 10-325 MG tablet Take 1 tablet by mouth every 6 (six) hours as needed. For 30d after last refill   . HYDROcodone-acetaminophen (NORCO) 10-325 MG tablet Take 1 tablet by mouth every 8 (eight) hours as needed. For 60d after signed or 30d after last refill   . loratadine (CLARITIN) 10 MG tablet Take 10 mg by mouth daily as needed for allergies. Reported on 04/02/2015 12/10/2013: .   No facility-administered encounter medications on file as of 04/02/2015.  HM utd for now  Review of Systems  Constitutional: Negative for activity change, appetite change, fatigue and unexpected weight change.  HENT: Negative for trouble swallowing.   Eyes: Negative for visual disturbance.  Respiratory: Negative for chest tightness and shortness of breath.   Cardiovascular: Negative for chest pain, palpitations and leg swelling.  Gastrointestinal: Negative for abdominal pain.  Genitourinary: Negative for difficulty urinating.  Musculoskeletal:       Back spasms with work--would like prn robaxin//once a week or less  Neurological: Negative for dizziness.  Hematological: Does not bruise/bleed easily.  Psychiatric/Behavioral: Negative for behavioral problems.       Objective:   Physical Exam  Constitutional: She is oriented to person, place, and time. She appears well-developed and well-nourished.  HENT:  Mouth/Throat: Oropharynx is clear and moist.  Eyes: Conjunctivae are normal. Pupils are equal, round, and reactive to light.  Neck: Normal range of motion. No thyromegaly  present.  Cardiovascular: Normal rate, regular rhythm and normal heart sounds.   No murmur heard. Pulmonary/Chest: Breath sounds normal.  Musculoskeletal:  SLR 90 bilat ok Knee tender/sl decr rom R  Lymphadenopathy:    She has no cervical adenopathy.  Neurological: She is alert and oriented to person, place, and time. No cranial nerve deficit.  Psychiatric: She has a normal mood and affect. Her behavior is normal. Judgment and thought content normal.  BP 137/69 mmHg  Pulse 69  Temp(Src) 97.8 F (36.6 C)  Resp 16  Ht _0  (1.549 m)  Wt 168 lb (76.204 kg)  BMI 31.76 kg/m2  .      Assessment & Plan:  Need for prophylactic vaccination and inoculation against influenza - Plan: Flu Vaccine QUAD 36+ mos IM  Essential hypertension -no change in meds  History of migraine headaches-refill Imitrex  Post-traumatic osteoarthritis of right knee Chronic knee pain,R Cervical disc syndrome Lumbosacral spasm intermittent -plan= continue hydrocodone//there is never been any evidence of overuse or illegal use  Allow Robaxin for low back spasm as needed  Depression with anxiety Insomnia -Plan= continue Celexa alprazolam  temazepam  Gastroesophageal reflux disease without esophagitis -Continue omeprazole  Hx Hyperlipidemia off statins--consider alternatives  Continue same meds Meds ordered this encounter  Medications  . methocarbamol (ROBAXIN) 500 MG tablet    Sig: Take 1 tablet (500 mg total) by mouth 2 (two) times daily with a meal.    Dispense:  60 tablet    Refill:  5  . temazepam (RESTORIL) 30 MG capsule    Sig: TAKE 1 CAPSULE BY MOUTH AT BEDTIME AS NEEDED FOR SLEEP    Dispense:  30 capsule    Refill:  5  . SUMAtriptan (IMITREX) 100 MG tablet    Sig: Take 1 tablet (100 mg total) by mouth every 2 (two) hours as needed for migraine or headache. May repeat in 2 hours if headache persists or recurs.    Dispense:  10 tablet    Refill:  11  . HYDROcodone-acetaminophen (NORCO)  10-325 MG tablet    Sig: Take 1 tablet by mouth every 6 (six) hours as needed. For 30d after last refill    Dispense:  120 tablet    Refill:  0  . HYDROcodone-acetaminophen (NORCO) 10-325 MG tablet    Sig: Take 1 tablet by mouth every 6 (six) hours as needed. For 30d after signed or after and 30d after refilled    Dispense:  120 tablet    Refill:  0  . omeprazole (PRILOSEC) 20 MG capsule    Sig: Take 1 capsule (20 mg total) by mouth every morning.    Dispense:  90 capsule    Refill:  3  . ALPRAZolam (XANAX) 0.5 MG tablet    Sig: Take 1 tablet (0.5 mg total) by mouth 3 (three) times daily as needed. for anxiety    Dispense:  90 tablet    Refill:  5  . HYDROcodone-acetaminophen (NORCO) 10-325 MG tablet    Sig: Take 1 tablet by mouth every 8 (eight) hours as needed. For 60d after signed or 30d after last refill  Dispense:  120 tablet    Refill:  0   Addend labs 2/10 Results for orders placed or performed in visit on 04/02/15  CBC with Differential/Platelet  Result Value Ref Range   WBC 8.3 4.0 - 10.5 K/uL   RBC 4.47 3.87 - 5.11 MIL/uL   Hemoglobin 13.2 12.0 - 15.0 g/dL   HCT 39.0 36.0 - 46.0 %   MCV 87.2 78.0 - 100.0 fL   MCH 29.5 26.0 - 34.0 pg   MCHC 33.8 30.0 - 36.0 g/dL   RDW 13.7 11.5 - 15.5 %   Platelets 253 150 - 400 K/uL   MPV 10.3 8.6 - 12.4 fL   Neutrophils Relative % 56 43 - 77 %   Neutro Abs 4.6 1.7 - 7.7 K/uL   Lymphocytes Relative 36 12 - 46 %   Lymphs Abs 3.0 0.7 - 4.0 K/uL   Monocytes Relative 6 3 - 12 %   Monocytes Absolute 0.5 0.1 - 1.0 K/uL   Eosinophils Relative 2 0 - 5 %   Eosinophils Absolute 0.2 0.0 - 0.7 K/uL   Basophils Relative 0 0 - 1 %   Basophils Absolute 0.0 0.0 - 0.1 K/uL   Smear Review Criteria for review not met   Comprehensive metabolic panel  Result Value Ref Range   Sodium 137 135 - 146 mmol/L   Potassium 4.1 3.5 - 5.3 mmol/L   Chloride 104 98 - 110 mmol/L   CO2 21 20 - 31 mmol/L   Glucose, Bld 92 65 - 99 mg/dL   BUN 23 7 - 25  mg/dL   Creat 0.67 0.50 - 0.99 mg/dL   Total Bilirubin 0.3 0.2 - 1.2 mg/dL   Alkaline Phosphatase 70 33 - 130 U/L   AST 12 10 - 35 U/L   ALT 11 6 - 29 U/L   Total Protein 7.1 6.1 - 8.1 g/dL   Albumin 4.3 3.6 - 5.1 g/dL   Calcium 9.4 8.6 - 10.4 mg/dL  Lipid panel  Result Value Ref Range   Cholesterol 231 (H) 125 - 200 mg/dL   Triglycerides 282 (H) <150 mg/dL   HDL 45 (L) >=46 mg/dL   Total CHOL/HDL Ratio 5.1 (H) <=5.0 Ratio   VLDL 56 (H) <30 mg/dL   LDL Cholesterol 130 (H) <130 mg/dL  POCT glycosylated hemoglobin (Hb A1C)  Result Value Ref Range   Hemoglobin A1C 6.3 Was 6.0 2015   Will suggest restarting statin tho is alittle better/wt loss! Letter re options for f/u

## 2015-04-06 ENCOUNTER — Emergency Department (HOSPITAL_COMMUNITY): Payer: BLUE CROSS/BLUE SHIELD

## 2015-04-06 ENCOUNTER — Inpatient Hospital Stay (HOSPITAL_COMMUNITY)
Admission: EM | Admit: 2015-04-06 | Discharge: 2015-04-09 | DRG: 512 | Disposition: A | Discharge status: Home / Self Care | Payer: BLUE CROSS/BLUE SHIELD | Attending: Orthopedic Surgery | Admitting: Orthopedic Surgery

## 2015-04-06 ENCOUNTER — Encounter (HOSPITAL_COMMUNITY): Payer: Self-pay | Admitting: Emergency Medicine

## 2015-04-06 DIAGNOSIS — W19XXXA Unspecified fall, initial encounter: Secondary | ICD-10-CM

## 2015-04-06 DIAGNOSIS — B1920 Unspecified viral hepatitis C without hepatic coma: Secondary | ICD-10-CM | POA: Diagnosis present

## 2015-04-06 DIAGNOSIS — F419 Anxiety disorder, unspecified: Secondary | ICD-10-CM | POA: Diagnosis present

## 2015-04-06 DIAGNOSIS — F329 Major depressive disorder, single episode, unspecified: Secondary | ICD-10-CM | POA: Diagnosis present

## 2015-04-06 DIAGNOSIS — Z419 Encounter for procedure for purposes other than remedying health state, unspecified: Secondary | ICD-10-CM

## 2015-04-06 DIAGNOSIS — Z96651 Presence of right artificial knee joint: Secondary | ICD-10-CM | POA: Diagnosis present

## 2015-04-06 DIAGNOSIS — S42401A Unspecified fracture of lower end of right humerus, initial encounter for closed fracture: Secondary | ICD-10-CM | POA: Diagnosis present

## 2015-04-06 DIAGNOSIS — I10 Essential (primary) hypertension: Secondary | ICD-10-CM | POA: Diagnosis present

## 2015-04-06 DIAGNOSIS — S42409A Unspecified fracture of lower end of unspecified humerus, initial encounter for closed fracture: Secondary | ICD-10-CM | POA: Diagnosis present

## 2015-04-06 DIAGNOSIS — S52021A Displaced fracture of olecranon process without intraarticular extension of right ulna, initial encounter for closed fracture: Secondary | ICD-10-CM | POA: Diagnosis present

## 2015-04-06 DIAGNOSIS — Y93K1 Activity, walking an animal: Secondary | ICD-10-CM | POA: Diagnosis not present

## 2015-04-06 DIAGNOSIS — S52121A Displaced fracture of head of right radius, initial encounter for closed fracture: Secondary | ICD-10-CM | POA: Diagnosis present

## 2015-04-06 DIAGNOSIS — W010XXA Fall on same level from slipping, tripping and stumbling without subsequent striking against object, initial encounter: Secondary | ICD-10-CM | POA: Diagnosis present

## 2015-04-06 DIAGNOSIS — M25521 Pain in right elbow: Secondary | ICD-10-CM | POA: Diagnosis present

## 2015-04-06 DIAGNOSIS — K219 Gastro-esophageal reflux disease without esophagitis: Secondary | ICD-10-CM | POA: Diagnosis present

## 2015-04-06 MED ORDER — ALPRAZOLAM 0.5 MG PO TABS
0.5000 mg | ORAL_TABLET | Freq: Three times a day (TID) | ORAL | Status: DC | PRN
  Administered 2015-04-07 – 2015-04-09 (×4): 0.5 mg via ORAL
  Filled 2015-04-06 (×4): qty 1

## 2015-04-06 MED ORDER — ONDANSETRON HCL 4 MG/2ML IJ SOLN
4.0000 mg | Freq: Once | INTRAMUSCULAR | Status: AC
  Administered 2015-04-06: 4 mg via INTRAVENOUS
  Filled 2015-04-06: qty 2

## 2015-04-06 MED ORDER — HYDROMORPHONE HCL 1 MG/ML IJ SOLN
0.5000 mg | INTRAMUSCULAR | Status: DC | PRN
Start: 2015-04-06 — End: 2015-04-09
  Administered 2015-04-06 – 2015-04-09 (×7): 1 mg via INTRAVENOUS
  Filled 2015-04-06 (×7): qty 1

## 2015-04-06 MED ORDER — HYDROCHLOROTHIAZIDE 12.5 MG PO CAPS
12.5000 mg | ORAL_CAPSULE | Freq: Every morning | ORAL | Status: DC
  Administered 2015-04-07 – 2015-04-09 (×2): 12.5 mg via ORAL
  Filled 2015-04-06 (×2): qty 1

## 2015-04-06 MED ORDER — ZOLPIDEM TARTRATE 5 MG PO TABS
5.0000 mg | ORAL_TABLET | Freq: Every evening | ORAL | Status: DC | PRN
  Filled 2015-04-06: qty 1

## 2015-04-06 MED ORDER — OXYCODONE HCL 5 MG PO TABS
5.0000 mg | ORAL_TABLET | ORAL | Status: DC | PRN
Start: 2015-04-06 — End: 2015-04-09
  Administered 2015-04-07 – 2015-04-09 (×12): 10 mg via ORAL
  Filled 2015-04-06 (×12): qty 2

## 2015-04-06 MED ORDER — HYDROMORPHONE HCL 1 MG/ML IJ SOLN
1.0000 mg | Freq: Once | INTRAMUSCULAR | Status: AC
  Administered 2015-04-06: 1 mg via INTRAVENOUS
  Filled 2015-04-06: qty 1

## 2015-04-06 MED ORDER — HYDROMORPHONE HCL 4 MG PO TABS
4.0000 mg | ORAL_TABLET | ORAL | Status: DC | PRN
Start: 2015-04-06 — End: 2015-04-09

## 2015-04-06 MED ORDER — SODIUM CHLORIDE 0.9 % IV SOLN
INTRAVENOUS | Status: DC
  Administered 2015-04-06: 22:00:00 via INTRAVENOUS

## 2015-04-06 MED ORDER — METHOCARBAMOL 1000 MG/10ML IJ SOLN
500.0000 mg | Freq: Four times a day (QID) | INTRAVENOUS | Status: DC | PRN
  Filled 2015-04-06: qty 5

## 2015-04-06 MED ORDER — HYDROMORPHONE HCL 4 MG PO TABS
4.0000 mg | ORAL_TABLET | ORAL | Status: DC | PRN
Start: 2015-04-06 — End: 2015-04-06

## 2015-04-06 MED ORDER — CITALOPRAM HYDROBROMIDE 40 MG PO TABS
40.0000 mg | ORAL_TABLET | Freq: Every morning | ORAL | Status: DC
  Administered 2015-04-07 – 2015-04-09 (×3): 40 mg via ORAL
  Filled 2015-04-06 (×4): qty 1

## 2015-04-06 MED ORDER — METHOCARBAMOL 500 MG PO TABS
500.0000 mg | ORAL_TABLET | Freq: Four times a day (QID) | ORAL | Status: DC | PRN
  Administered 2015-04-07 – 2015-04-09 (×6): 500 mg via ORAL
  Filled 2015-04-06 (×7): qty 1

## 2015-04-06 NOTE — ED Notes (Signed)
Othro tech at bedside.

## 2015-04-06 NOTE — ED Notes (Signed)
Bed: WA12 Expected date:  Expected time:  Means of arrival:  Comments: EMS Fall 

## 2015-04-06 NOTE — ED Notes (Signed)
Pt sts that she was walking her dog, walked down an incline and tripped. Pt denies hitting head or LOC. Pt c/o R arm pain, L knee and L palm pain. Pt is A&O and in NAD. On scene fire splinted R arm.

## 2015-04-06 NOTE — ED Notes (Signed)
Pt from a walking trail vis EMS post fall, Per EMS pt sts that trail dipped, she tripped and fell. Pt has deformity to R arm. Pt received 100 Fentanyl and 4 Zofran en route. Pt is A&O and in NAD. Pt has strong pulses and circulation to injured area.

## 2015-04-06 NOTE — H&P (Signed)
Madison Collins is an 65 y.o. female.  HPI: the patient is a 65 year old female who fell earlier today.  She suffered a fracture dislocation of her elbow.  CT scan was obtained confirming the diagnosis.  I spoke with her to upper extremity specialists who felt that open reduction internal fixation with possible radial head replacement would be necessary.  They felt that she should be discharged from the hospital and follow up as an outpatient to plan the surgery.  Unfortunately the patient has had intractable pain and is incapable of going home at the level pain she has at this point.  The patient is admitted tonight for intractable pain with fracture dislocation of the elbow.  She denies numbness or tingling currently  Past Medical History  Diagnosis Date  . Hepatitis C     IA viral load low 02/09    BX past mild fibrosis  . Hypertension   . Insomnia, unspecified   . Knee pain   . Anxiety and depression   . Bulging disc 1990's    c4-5  . Post-menopause 2010  . GERD (gastroesophageal reflux disease)   . H/O: osteoarthritis   . Arthritis   . Depression   . Cataract     right eye  . Anxiety   . Headache     migraines occasionally    Past Surgical History  Procedure Laterality Date  . Tubal ligation  1987  . Knee surgery Right      x4  . Eye surgery Right 2010    hole in macula  . Ovarian cyst removal    . Bunionectomy Right 1983  . Colonoscopy  10/22/13  . Total knee arthroplasty Right 12/24/2013    Procedure: RIGHT TOTAL KNEE ARTHROPLASTY;  Surgeon: Kerin Salen, MD;  Location: [facility];  Service: Orthopedics;  Laterality: Right;    Family History  Problem Relation Age of Onset  . Cancer Mother     Lung  . Diabetes Mother   . Stroke Father   . Diabetes Father   . Colon cancer Father 36    anal cancer  . Hypertension Brother   . Bipolar disorder Daughter   . Bipolar disorder Son   . Diabetes Maternal Grandfather   . Heart disease Maternal Grandfather   .  Emphysema Paternal Grandmother   . Cancer Paternal Grandfather     stomach    Social History:  reports that she has never smoked. She has never used smokeless tobacco. She reports that she drinks alcohol. She reports that she does not use illicit drugs.  Allergies:  Allergies  Allergen Reactions  . Latex Other (See Comments)    "Latex tape used during surgery - breaks my skin out"  . Adhesive [Tape] Rash    "per pt - surgical tape, also causes itching    Medications: Prior to Admission:  (Not in a hospital admission)I have reviewed the patient's home medications and she will be admitted on those same medications.  No results found for this or any previous visit (from the past 48 hour(s)).  Ct Elbow Right Wo Contrast  04/06/2015  CLINICAL DATA:  Right elbow pain status post fall. Fractures on radiographs. EXAM: CT OF THE RIGHT ELBOW WITHOUT CONTRAST TECHNIQUE: Multidetector CT imaging was performed according to the standard protocol. Multiplanar CT image reconstructions were also generated. COMPARISON:  Radiograph same date. FINDINGS: Examination was performed with the patient's arm by her side. Images are mildly degraded by beam hardening artifact.  There is an impacted fracture of the radial head anteriorly, best seen on the sagittally reformatted images. The radial head is posterolaterally subluxed from the capitellum. There is a comminuted fracture of the proximal ulna which is angulated with posterior displacement of the shaft relative to the olecranon. There is intra-articular extension of this fracture adjacent to the articulation with the trochlea, although this component is nondisplaced. There is no dislocation of the humeral ulnar joint. The distal humerus appears intact. There is soft tissue swelling around the elbow with a moderate-sized hemarthrosis. Technique does not permit adequate evaluation of the elbow tendons. IMPRESSION: 1. Impacted intra-articular fracture of the radial head  with posterolateral subluxation at the radio capitellar articulation. 2. Comminuted and displaced intra-articular fracture of the proximal ulna. 3. The distal humerus appears intact. Electronically Signed   By: Richardean Sale M.D.   On: 04/06/2015 19:09   Dg Humerus Right  04/06/2015  CLINICAL DATA:  65 year old who fell while walking her dog earlier today, injuring the right upper arm and right elbow with severe pain. Initial encounter. EXAM: RIGHT HUMERUS - 2+ VIEW COMPARISON:  None. FINDINGS: No evidence of acute fracture involving the humerus. Visualized shoulder joint intact. Acute comminuted fractures involving the olecranon and the radial head at the elbow. IMPRESSION: 1. Normal-appearing humerus. 2. Acute comminuted fractures involving the olecranon and radial head at the elbow. Dedicated elbow x-rays are suggested in further evaluation. Electronically Signed   By: Evangeline Dakin M.D.   On: 04/06/2015 15:36    ROS  ROS: I have reviewed the patient's review of systems thoroughly and there are no positive responses as relates to the HPI. EXAM: Blood pressure 134/69, pulse 66, temperature 97.7 F (36.5 C), temperature source Oral, resp. rate 18, SpO2 98 %. Physical Exam Well-developed well-nourished patient in no acute distress. Alert and oriented x3 HEENT:within normal limits Cardiac: Regular rate and rhythm Pulmonary: Lungs clear to auscultation Abdomen: Soft and nontender.  Normal active bowel sounds  Musculoskeletal: right elbow dramatically tender to palpation throughout the elbow.  Pain with all attempted range of motion. Sensation intact and motor function rad,ulnar, and med nerves intact Assessment/Plan: 65 year old female with a fracture dislocation of the right elbow earlier today.  The patient will need open reduction internal fixation with possible radial head replacement.  This needs to be done by an upper extremity specialist.  I reviewed her case with our shoulder elbow  fellow as well as her upper extremity specialist.  The plan was to see her in the office tomorrow but unfortunately she has had intractable pain.//She will be admitted because of intractable pain and Overnight.  I will have further discussion with her upper surety specialists about his thoughts on timing of her surgery but will likely take place on Tuesday.  She will be admitted to [facility] hospital as OR time is a problem here at [facility].  Arleigh Dicola L 04/06/2015, 9:19 PM

## 2015-04-06 NOTE — ED Provider Notes (Addendum)
Patient signed out to me by Dr. Audie Pinto. I also spoke with Dr. Berenice Primas the orthopedist on call and he requested that the patient follow-up with Dr. Grandville Silos tomorrow at noon  7:58 PM Nursing went to discharge the patient and she can plan continuous pain. Patient given additional dose of pain meds. Will consult orthopedics  Lacretia Leigh, MD 04/06/15 1940  Lacretia Leigh, MD 04/06/15 [phone]

## 2015-04-06 NOTE — ED Notes (Signed)
Pt taken to CT scan and returned to room without distress noted. 

## 2015-04-06 NOTE — ED Provider Notes (Signed)
CSN: [id number]     Arrival date & time 04/06/15  1218 History   First MD Initiated Contact with Patient 04/06/15 1408     Chief Complaint  Patient presents with  . Arm Injury      Patient is a 65 y.o. female presenting with arm injury.  Arm Injury  Pt from a walking trail vis EMS post fall, Per EMS pt sts that trail dipped, she tripped and fell. Pt has deformity to R arm. Pt received 100 Fentanyl and 4 Zofran en route. Pt is A&O and in NAD. Pt has strong pulses and circulation to injured area. Past Medical History  Diagnosis Date  . Hepatitis C     IA viral load low 02/09    BX past mild fibrosis  . Hypertension   . Insomnia, unspecified   . Knee pain   . Anxiety and depression   . Bulging disc 1990's    c4-5  . Post-menopause 2010  . GERD (gastroesophageal reflux disease)   . H/O: osteoarthritis   . Arthritis   . Depression   . Cataract     right eye  . Anxiety   . Headache     migraines occasionally   Past Surgical History  Procedure Laterality Date  . Tubal ligation  1987  . Knee surgery Right      x4  . Eye surgery Right 2010    hole in macula  . Ovarian cyst removal    . Bunionectomy Right 1983  . Colonoscopy  10/22/13  . Total knee arthroplasty Right 12/24/2013    Procedure: RIGHT TOTAL KNEE ARTHROPLASTY;  Surgeon: Kerin Salen, MD;  Location: [place];  Service: Orthopedics;  Laterality: Right;   Family History  Problem Relation Age of Onset  . Cancer Mother     Lung  . Diabetes Mother   . Stroke Father   . Diabetes Father   . Colon cancer Father 73    anal cancer  . Hypertension Brother   . Bipolar disorder Daughter   . Bipolar disorder Son   . Diabetes Maternal Grandfather   . Heart disease Maternal Grandfather   . Emphysema Paternal Grandmother   . Cancer Paternal Grandfather     stomach   Social History  Substance Use Topics  . Smoking status: Never Smoker   . Smokeless tobacco: Never Used  . Alcohol Use: Yes     Comment: a glass of  wine/rarely   OB History    No data available     Review of Systems  All other systems reviewed and are negative.     Allergies  Latex and Adhesive  Home Medications   Prior to Admission medications   Medication Sig Start Date End Date Taking? Authorizing Provider  ALPRAZolam Duanne Moron) 0.5 MG tablet Take 1 tablet (0.5 mg total) by mouth 3 (three) times daily as needed. for anxiety Patient taking differently: Take 0.5 mg by mouth at bedtime as needed for anxiety. for anxiety 04/02/15  Yes Leandrew Koyanagi, MD  citalopram (CELEXA) 40 MG tablet Take 1 tablet (40 mg total) by mouth every morning. 07/31/14  Yes Leandrew Koyanagi, MD  hydrochlorothiazide (MICROZIDE) 12.5 MG capsule Take 1 capsule (12.5 mg total) by mouth every morning. 07/31/14  Yes Leandrew Koyanagi, MD  Influenza Vac Split Quad (FLUZONE) 0.25 ML injection Inject 0.25 mLs into the muscle once.   Yes Historical Provider, MD  loratadine (CLARITIN) 10 MG tablet Take 10 mg by mouth daily  as needed for allergies. Reported on 04/02/2015   Yes Historical Provider, MD  meloxicam (MOBIC) 15 MG tablet Take 1 tablet (15 mg total) by mouth daily. 07/31/14  Yes Leandrew Koyanagi, MD  omeprazole (PRILOSEC) 20 MG capsule Take 1 capsule (20 mg total) by mouth every morning. 04/02/15  Yes Leandrew Koyanagi, MD  Soft Lens Products (REWETTING DROPS) SOLN Place 1-2 drops into both eyes as needed (for dry contacts).   Yes Historical Provider, MD  SUMAtriptan (IMITREX) 100 MG tablet Take 1 tablet (100 mg total) by mouth every 2 (two) hours as needed for migraine or headache. May repeat in 2 hours if headache persists or recurs. 04/02/15  Yes Leandrew Koyanagi, MD  temazepam (RESTORIL) 30 MG capsule TAKE 1 CAPSULE BY MOUTH AT BEDTIME AS NEEDED FOR SLEEP 04/02/15  Yes Leandrew Koyanagi, MD  atorvastatin (LIPITOR) 20 MG tablet Take 1 tablet (20 mg total) by mouth daily. Patient not taking: Reported on 10/15/2014 01/29/14   Leandrew Koyanagi, MD   fluocinonide-emollient (LIDEX-E) 0.05 % cream APPLY TWICE DAILY TO FOOT DERMATITIS Patient not taking: Reported on 04/02/2015 02/14/14   Mancel Bale, PA-C  HYDROmorphone (DILAUDID) 4 MG tablet Take 1 tablet (4 mg total) by mouth every 4 (four) hours as needed for severe pain. 04/06/15   Leonard Schwartz, MD  methocarbamol (ROBAXIN) 500 MG tablet Take 1 tablet (500 mg total) by mouth 2 (two) times daily with a meal. Patient not taking: Reported on 04/06/2015 04/02/15   Leandrew Koyanagi, MD   BP 152/59 mmHg  Pulse 67  Temp(Src) 97.7 F (36.5 C) (Oral)  Resp 18  SpO2 100% Physical Exam  Constitutional: She is oriented to person, place, and time. She appears well-developed and well-nourished. No distress.  HENT:  Head: Normocephalic and atraumatic.  Eyes: Pupils are equal, round, and reactive to light.  Neck: Normal range of motion.  Cardiovascular: Normal rate and intact distal pulses.   Pulmonary/Chest: No respiratory distress.  Abdominal: Normal appearance. She exhibits no distension.  Musculoskeletal: She exhibits tenderness.       Right elbow: She exhibits decreased range of motion, swelling and deformity. Tenderness found.       Right upper arm: She exhibits tenderness. She exhibits no bony tenderness, no swelling and no deformity.  Neurological: She is alert and oriented to person, place, and time. No cranial nerve deficit.  Skin: Skin is warm and dry. No rash noted.  Psychiatric: She has a normal mood and affect. Her behavior is normal.  Nursing note and vitals reviewed.   ED Course  Procedures (including critical care time) Medications  HYDROmorphone (DILAUDID) injection 1 mg (not administered)  HYDROmorphone (DILAUDID) injection 1 mg (1 mg Intravenous Given 04/06/15 1510)  ondansetron (ZOFRAN) injection 4 mg (4 mg Intravenous Given 04/06/15 1510)    Labs Review Labs Reviewed - No data to display  Imaging Review Dg Humerus Right  04/06/2015  CLINICAL DATA:  65 year old who  fell while walking her dog earlier today, injuring the right upper arm and right elbow with severe pain. Initial encounter. EXAM: RIGHT HUMERUS - 2+ VIEW COMPARISON:  None. FINDINGS: No evidence of acute fracture involving the humerus. Visualized shoulder joint intact. Acute comminuted fractures involving the olecranon and the radial head at the elbow. IMPRESSION: 1. Normal-appearing humerus. 2. Acute comminuted fractures involving the olecranon and radial head at the elbow. Dedicated elbow x-rays are suggested in further evaluation. Electronically Signed   By: Sherran Needs.D.  On: 04/06/2015 15:36   I have personally reviewed and evaluated these images and lab results as part of my medical decision-making.  Dr. Berenice Primas from orthopedics reviewed the x-rays.  He recommended posterior splint placed on the right elbow after which to obtain a CT scan of the right elbow.  If she tolerates that well she can go home and see him in the morning.  If she is unable to tolerate that Dr. Berenice Primas is to be contacted and he will admit for pain control.  MDM   Final diagnoses:  Elbow fracture, right, closed, initial encounter  Linus Galas, MD 04/08/15 0800

## 2015-04-07 MED ORDER — CEFAZOLIN SODIUM-DEXTROSE 2-3 GM-% IV SOLR
2.0000 g | INTRAVENOUS | Status: AC
  Administered 2015-04-08: 2 g via INTRAVENOUS
  Filled 2015-04-07: qty 50

## 2015-04-07 MED ORDER — PANTOPRAZOLE SODIUM 40 MG PO TBEC
40.0000 mg | DELAYED_RELEASE_TABLET | Freq: Every day | ORAL | Status: DC
  Administered 2015-04-07 – 2015-04-09 (×3): 40 mg via ORAL
  Filled 2015-04-07 (×3): qty 1

## 2015-04-07 NOTE — ED Notes (Signed)
Report called to [facility]

## 2015-04-07 NOTE — Progress Notes (Signed)
Patient ID: Madison Collins, female   DOB: 11-25-1950, 65 y.o.   MRN: [id number]  Patient with comminuted R olecranon and radial head/neck fxs. NVI D/W patient surgical plan for 04-08-15, including risks/options/goals and plan for postop Naprosyn to reduce HO risk.  Consent obtained and site marked. Will plan on OR in afternoon on 04-08-15.  [facility] [phone]

## 2015-04-07 NOTE — ED Notes (Signed)
[facility] called..klj 

## 2015-04-08 ENCOUNTER — Inpatient Hospital Stay (HOSPITAL_COMMUNITY): Payer: BLUE CROSS/BLUE SHIELD | Admitting: Anesthesiology

## 2015-04-08 ENCOUNTER — Encounter (HOSPITAL_COMMUNITY): Payer: Self-pay | Admitting: Anesthesiology

## 2015-04-08 ENCOUNTER — Inpatient Hospital Stay (HOSPITAL_COMMUNITY): Payer: BLUE CROSS/BLUE SHIELD

## 2015-04-08 ENCOUNTER — Encounter (HOSPITAL_COMMUNITY)
Admission: EM | Disposition: A | Discharge status: Home / Self Care | Payer: Self-pay | Source: Home / Self Care | Attending: Orthopedic Surgery

## 2015-04-08 HISTORY — PX: ORIF ELBOW FRACTURE: SHX5031

## 2015-04-08 HISTORY — PX: ORIF RADIAL FRACTURE: SHX5113

## 2015-04-08 LAB — SURGICAL PCR SCREEN
MRSA, PCR: NEGATIVE
STAPHYLOCOCCUS AUREUS: POSITIVE — AB

## 2015-04-08 SURGERY — OPEN REDUCTION INTERNAL FIXATION (ORIF) RADIAL FRACTURE
Anesthesia: Regional | Site: Elbow | Laterality: Right

## 2015-04-08 MED ORDER — PROPOFOL 500 MG/50ML IV EMUL
INTRAVENOUS | Status: DC | PRN
  Administered 2015-04-08: 100 ug/kg/min via INTRAVENOUS

## 2015-04-08 MED ORDER — BUPIVACAINE-EPINEPHRINE (PF) 0.5% -1:200000 IJ SOLN
INTRAMUSCULAR | Status: AC
  Filled 2015-04-08: qty 30

## 2015-04-08 MED ORDER — OXYCODONE-ACETAMINOPHEN 5-325 MG PO TABS: 1.0000 | ORAL_TABLET | ORAL | Status: DC | PRN

## 2015-04-08 MED ORDER — FENTANYL CITRATE (PF) 100 MCG/2ML IJ SOLN
INTRAMUSCULAR | Status: DC | PRN
  Administered 2015-04-08 (×2): 25 ug via INTRAVENOUS
  Administered 2015-04-08: 50 ug via INTRAVENOUS
  Administered 2015-04-08 (×2): 25 ug via INTRAVENOUS

## 2015-04-08 MED ORDER — MIDAZOLAM HCL 2 MG/2ML IJ SOLN
INTRAMUSCULAR | Status: AC
  Administered 2015-04-08: 2 mg
  Filled 2015-04-08: qty 2

## 2015-04-08 MED ORDER — HYDROMORPHONE HCL 1 MG/ML IJ SOLN: 0.2500 mg | INTRAMUSCULAR | Status: DC | PRN

## 2015-04-08 MED ORDER — FENTANYL CITRATE (PF) 100 MCG/2ML IJ SOLN
50.0000 ug | Freq: Once | INTRAMUSCULAR | Status: AC
  Administered 2015-04-08: 50 ug via INTRAVENOUS

## 2015-04-08 MED ORDER — ONDANSETRON HCL 4 MG/2ML IJ SOLN: 4.0000 mg | Freq: Once | INTRAMUSCULAR | Status: DC | PRN

## 2015-04-08 MED ORDER — MIDAZOLAM HCL 2 MG/2ML IJ SOLN
INTRAMUSCULAR | Status: AC
Start: 2015-04-08 — End: 2015-04-08
  Filled 2015-04-08: qty 2

## 2015-04-08 MED ORDER — ONDANSETRON HCL 4 MG/2ML IJ SOLN
INTRAMUSCULAR | Status: DC | PRN
  Administered 2015-04-08: 4 mg via INTRAVENOUS

## 2015-04-08 MED ORDER — ONDANSETRON HCL 4 MG/2ML IJ SOLN
INTRAMUSCULAR | Status: AC
  Filled 2015-04-08: qty 2

## 2015-04-08 MED ORDER — LIDOCAINE HCL (CARDIAC) 20 MG/ML IV SOLN
INTRAVENOUS | Status: AC
  Filled 2015-04-08: qty 5

## 2015-04-08 MED ORDER — PROPOFOL 10 MG/ML IV BOLUS
INTRAVENOUS | Status: AC
  Filled 2015-04-08: qty 20

## 2015-04-08 MED ORDER — LIDOCAINE HCL (CARDIAC) 20 MG/ML IV SOLN
INTRAVENOUS | Status: DC | PRN
  Administered 2015-04-08: 50 mg via INTRAVENOUS

## 2015-04-08 MED ORDER — FENTANYL CITRATE (PF) 100 MCG/2ML IJ SOLN
INTRAMUSCULAR | Status: AC
  Administered 2015-04-08: 50 ug via INTRAVENOUS
  Filled 2015-04-08: qty 2

## 2015-04-08 MED ORDER — 0.9 % SODIUM CHLORIDE (POUR BTL) OPTIME
TOPICAL | Status: DC | PRN
Start: 2015-04-08 — End: 2015-04-08
  Administered 2015-04-08: 1000 mL

## 2015-04-08 MED ORDER — MIDAZOLAM HCL 2 MG/2ML IJ SOLN: 2.0000 mg | Freq: Once | INTRAMUSCULAR | Status: DC

## 2015-04-08 MED ORDER — LIDOCAINE HCL (PF) 1 % IJ SOLN
INTRAMUSCULAR | Status: AC
  Filled 2015-04-08: qty 30

## 2015-04-08 MED ORDER — NAPROXEN 500 MG PO TABS: 500.0000 mg | ORAL_TABLET | Freq: Two times a day (BID) | ORAL | Status: DC

## 2015-04-08 MED ORDER — NAPROXEN 250 MG PO TABS
500.0000 mg | ORAL_TABLET | Freq: Two times a day (BID) | ORAL | Status: DC
  Administered 2015-04-09: 500 mg via ORAL
  Filled 2015-04-08: qty 2

## 2015-04-08 MED ORDER — BUPIVACAINE-EPINEPHRINE (PF) 0.25% -1:200000 IJ SOLN
INTRAMUSCULAR | Status: AC
  Filled 2015-04-08: qty 30

## 2015-04-08 MED ORDER — BUPIVACAINE-EPINEPHRINE (PF) 0.5% -1:200000 IJ SOLN
INTRAMUSCULAR | Status: DC | PRN
  Administered 2015-04-08: 30 mL via PERINEURAL

## 2015-04-08 MED ORDER — FENTANYL CITRATE (PF) 250 MCG/5ML IJ SOLN
INTRAMUSCULAR | Status: AC
  Filled 2015-04-08: qty 5

## 2015-04-08 MED ORDER — MEPERIDINE HCL 25 MG/ML IJ SOLN: 6.2500 mg | INTRAMUSCULAR | Status: DC | PRN

## 2015-04-08 MED ORDER — PROPOFOL 10 MG/ML IV BOLUS
INTRAVENOUS | Status: DC | PRN
  Administered 2015-04-08: 20 mg via INTRAVENOUS

## 2015-04-08 MED ORDER — LACTATED RINGERS IV SOLN
INTRAVENOUS | Status: DC | PRN
  Administered 2015-04-08: 16:00:00 via INTRAVENOUS

## 2015-04-08 MED ORDER — MIDAZOLAM HCL 5 MG/5ML IJ SOLN
INTRAMUSCULAR | Status: DC | PRN
  Administered 2015-04-08: 2 mg via INTRAVENOUS

## 2015-04-08 SURGICAL SUPPLY — 72 items
BIT DRILL 1.5MMX85MM CALIB AO (BIT) ×2 IMPLANT
BIT DRILL 2.5X2.75 QC CALB (BIT) ×3 IMPLANT
BIT DRILL CALIBRATED 2.7 (BIT) ×3 IMPLANT
BLADE AVERAGE 25X9 (BLADE) IMPLANT
BLADE SURG 15 STRL LF DISP TIS (BLADE) ×2 IMPLANT
BLADE SURG 15 STRL SS (BLADE) ×1
BNDG COHESIVE 4X5 TAN STRL (GAUZE/BANDAGES/DRESSINGS) ×3 IMPLANT
BNDG COHESIVE 6X5 TAN STRL LF (GAUZE/BANDAGES/DRESSINGS) IMPLANT
BNDG ESMARK 4X9 LF (GAUZE/BANDAGES/DRESSINGS) ×3 IMPLANT
BNDG GAUZE ELAST 4 BULKY (GAUZE/BANDAGES/DRESSINGS) ×6 IMPLANT
BRUSH SCRUB EZ PLAIN DRY (MISCELLANEOUS) ×3 IMPLANT
CANISTER SUCTION WELLS/JOHNSON (MISCELLANEOUS) ×3 IMPLANT
CHLORAPREP W/TINT 26ML (MISCELLANEOUS) ×3 IMPLANT
CORDS BIPOLAR (ELECTRODE) ×3 IMPLANT
COVER MAYO STAND STRL (DRAPES) ×6 IMPLANT
CUFF TOURNIQUET SINGLE 18IN (TOURNIQUET CUFF) IMPLANT
CUFF TOURNIQUET SINGLE 24IN (TOURNIQUET CUFF) ×3 IMPLANT
DRAIN PENROSE 1/4X12 LTX STRL (WOUND CARE) ×3 IMPLANT
DRAPE C-ARM 42X72 X-RAY (DRAPES) ×3 IMPLANT
DRAPE U-SHAPE 47X51 STRL (DRAPES) ×3 IMPLANT
DRAPE U-SHAPE 76X120 STRL (DRAPES) IMPLANT
DRILL 1.5MMX85MM CALIB AO CONN (BIT) ×3
DRILL SLEEVE 2.7 DIST TIB (TRAUMA) ×1
DRSG ADAPTIC 3X8 NADH LF (GAUZE/BANDAGES/DRESSINGS) ×3 IMPLANT
DRSG EMULSION OIL 3X3 NADH (GAUZE/BANDAGES/DRESSINGS) IMPLANT
ELECT CAUTERY BLADE 6.4 (BLADE) ×3 IMPLANT
ELECT REM PT RETURN 9FT ADLT (ELECTROSURGICAL) ×3
ELECTRODE REM PT RTRN 9FT ADLT (ELECTROSURGICAL) ×2 IMPLANT
GAUZE SPONGE 4X4 12PLY STRL (GAUZE/BANDAGES/DRESSINGS) ×3 IMPLANT
GLOVE BIO SURGEON STRL SZ7.5 (GLOVE) IMPLANT
GLOVE BIOGEL PI IND STRL 8 (GLOVE) ×2 IMPLANT
GLOVE BIOGEL PI INDICATOR 8 (GLOVE) ×1
GOWN STRL REUS W/ TWL LRG LVL3 (GOWN DISPOSABLE) ×6 IMPLANT
GOWN STRL REUS W/TWL LRG LVL3 (GOWN DISPOSABLE) ×3
GOWN STRL REUS W/TWL XL LVL3 (GOWN DISPOSABLE) ×3 IMPLANT
KIT BASIN OR (CUSTOM PROCEDURE TRAY) ×3 IMPLANT
NEEDLE HYPO 25X1 1.5 SAFETY (NEEDLE) ×3 IMPLANT
NS IRRIG 1000ML POUR BTL (IV SOLUTION) ×3 IMPLANT
PACK ORTHO EXTREMITY (CUSTOM PROCEDURE TRAY) ×3 IMPLANT
PAD CAST 4YDX4 CTTN HI CHSV (CAST SUPPLIES) ×2 IMPLANT
PADDING CAST ABS 4INX4YD NS (CAST SUPPLIES)
PADDING CAST ABS COTTON 4X4 ST (CAST SUPPLIES) IMPLANT
PADDING CAST COTTON 4X4 STRL (CAST SUPPLIES) ×1
PENCIL BUTTON HOLSTER BLD 10FT (ELECTRODE) IMPLANT
PLATE OLECRANON SM (Plate) ×3 IMPLANT
PLATE RADIAL HEAD EXPLOR RT (Plate) ×3 IMPLANT
SCREW 2.0X18MM NONLOCKING (Screw) ×3 IMPLANT
SCREW BONE LOCKING 2.0MMX16MM (Screw) ×9 IMPLANT
SCREW BONE NON-LOCK 2.0MMX16MM (Screw) ×6 IMPLANT
SCREW LOCK CORT STAR 3.5X14 (Screw) ×3 IMPLANT
SCREW LOCK CORT STAR 3.5X16 (Screw) ×9 IMPLANT
SCREW LOCK CORT STAR 3.5X22 (Screw) ×3 IMPLANT
SCREW LOCK CORT STAR 3.5X28 (Screw) ×3 IMPLANT
SCREW LOCKING 2.0X20MM (Screw) ×3 IMPLANT
SCREW LOW PROFILE 22MMX3.5MM (Screw) ×3 IMPLANT
SCREW LOW PROFILE 3.5MMX42 (Screw) ×3 IMPLANT
SLEEVE DRILL 2.7 DIST TIB (TRAUMA) ×2 IMPLANT
SLING ARM IMMOBILIZER LRG (SOFTGOODS) IMPLANT
SPONGE LAP 18X18 X RAY DECT (DISPOSABLE) ×3 IMPLANT
STAPLER VISISTAT 35W (STAPLE) ×3 IMPLANT
SUT VIC AB 0 CT2 27 (SUTURE) ×3 IMPLANT
SUT VIC AB 3-0 X1 27 (SUTURE) ×3 IMPLANT
SUT VICRYL 4-0 PS2 18IN ABS (SUTURE) IMPLANT
SUT VICRYL RAPIDE 4/0 PS 2 (SUTURE) IMPLANT
SYR BULB 3OZ (MISCELLANEOUS) IMPLANT
SYRINGE 10CC LL (SYRINGE) ×3 IMPLANT
TOWEL OR 17X24 6PK STRL BLUE (TOWEL DISPOSABLE) ×3 IMPLANT
TOWEL OR NON WOVEN STRL DISP B (DISPOSABLE) ×3 IMPLANT
TUBE CONNECTING 20X1/4 (TUBING) ×3 IMPLANT
UNDERPAD 30X30 INCONTINENT (UNDERPADS AND DIAPERS) ×3 IMPLANT
WASHER 3.5MM (Orthopedic Implant) ×3 IMPLANT
YANKAUER SUCT BULB TIP NO VENT (SUCTIONS) ×3 IMPLANT

## 2015-04-08 NOTE — Discharge Instructions (Signed)
Discharge Instructions   You have a dressing on your arm. Stay in the sling at all times, except when working on elbow range-of-motion exercises Move your fingers as much as possible, making a full fist and fully opening the fist. Elevate your hand to reduce pain & swelling of the digits.  Ice over the operative site may be helpful to reduce pain & swelling.  DO NOT USE HEAT. Pain medicine has been prescribed for you.  Use your medicine as needed over the first 48 hours, and then you can begin to taper your use.  You may use Tylenol in place of your prescribed pain medication, but not IN ADDITION to it. Leave the dressing in place until you return to our office.  You may shower, but keep the bandage clean & dry.  You may drive a car when you are off of prescription pain medications and can safely control your vehicle with both hands.   Please call [phone] during normal business hours or [phone] after hours for any problems. Including the following:  - excessive redness of the incisions - drainage for more than 4 days - fever of more than 101.5 F  *Please note that pain medications will not be refilled after hours or on weekends.

## 2015-04-08 NOTE — Anesthesia Preprocedure Evaluation (Signed)
Anesthesia Evaluation  Patient identified by MRN, date of birth, ID band Patient awake    Reviewed: Allergy & Precautions, NPO status , Patient's Chart, lab work & pertinent test results  Airway Mallampati: I  TM Distance: >3 FB Neck ROM: Full    Dental   Pulmonary    Pulmonary exam normal        Cardiovascular hypertension, Pt. on medications Normal cardiovascular exam     Neuro/Psych    GI/Hepatic GERD  Medicated and Controlled,(+) Hepatitis -, C  Endo/Other    Renal/GU      Musculoskeletal   Abdominal   Peds  Hematology   Anesthesia Other Findings   Reproductive/Obstetrics                             Anesthesia Physical Anesthesia Plan  ASA: II  Anesthesia Plan: Regional   Post-op Pain Management:    Induction: Intravenous  Airway Management Planned: Natural Airway  Additional Equipment:   Intra-op Plan:   Post-operative Plan:   Informed Consent: I have reviewed the patients History and Physical, chart, labs and discussed the procedure including the risks, benefits and alternatives for the proposed anesthesia with the patient or authorized representative who has indicated his/her understanding and acceptance.     Plan Discussed with: CRNA and Surgeon  Anesthesia Plan Comments:         Anesthesia Quick Evaluation

## 2015-04-08 NOTE — Anesthesia Postprocedure Evaluation (Signed)
Anesthesia Post Note  Patient: Madison Collins  Procedure(s) Performed: Procedure(s) (LRB): OPEN REDUCTION INTERNAL FIXATION (ORIF) RADIAL HEAD  (Right) OPEN REDUCTION INTERNAL FIXATION (ORIF) ELBOW/OLECRANON FRACTURE (Right)  Patient location during evaluation: PACU Anesthesia Type: Regional Level of consciousness: awake and alert Pain management: pain level controlled Vital Signs Assessment: post-procedure vital signs reviewed and stable Respiratory status: spontaneous breathing, nonlabored ventilation, respiratory function stable and patient connected to nasal cannula oxygen Cardiovascular status: stable and blood pressure returned to baseline Anesthetic complications: no    Last Vitals:  Filed Vitals:   04/08/15 1828 04/08/15 1830  BP: 132/63   Pulse: 62 57  Temp:    Resp: 16 18    Last Pain:  Filed Vitals:   04/08/15 1835  PainSc: 0-No pain                 Cedrica Brune DAVID

## 2015-04-08 NOTE — Anesthesia Procedure Notes (Signed)
Anesthesia Regional Block:  Supraclavicular block  Pre-Anesthetic Checklist: ,, timeout performed, Correct Patient, Correct Site, Correct Laterality, Correct Procedure, Correct Position, site marked, Risks and benefits discussed,  Surgical consent,  Pre-op evaluation,  At surgeon's request and post-op pain management  Laterality: Right  Prep: chloraprep       Needles:  Injection technique: Single-shot  Needle Type: Echogenic Stimulator Needle     Needle Length: 9cm 9 cm Needle Gauge: 21 and 21 G    Additional Needles:  Procedures: ultrasound guided (picture in chart) and nerve stimulator Supraclavicular block  Nerve Stimulator or Paresthesia:  Response: 0.4 mA,   Additional Responses:   Narrative:  Start time: 04/08/2015 3:05 PM End time: 04/08/2015 3:15 PM Injection made incrementally with aspirations every 5 mL.  Performed by: Personally  Anesthesiologist: Lillia Abed  Additional Notes: Monitors applied. Patient sedated. Sterile prep and drape,hand hygiene and sterile gloves were used. Relevant anatomy identified.Needle position confirmed.Local anesthetic injected incrementally after negative aspiration. Local anesthetic spread visualized around nerve(s). Vascular puncture avoided. No complications. Image printed for medical record.The patient tolerated the procedure well.

## 2015-04-08 NOTE — Op Note (Signed)
04/06/2015 - 04/08/2015  5:48 PM  PATIENT:  Madison Collins  65 y.o. female  PRE-OPERATIVE DIAGNOSIS:  Comminuted fractures of the right proximal radius and ulna  POST-OPERATIVE DIAGNOSIS:  Same  PROCEDURE:  ORIF right radial head fracture and olecranon fracture  SURGEON: Rayvon Char. Grandville Silos, MD  PHYSICIAN ASSISTANT: Morley Kos, OPA-C  ANESTHESIA:  regional  SPECIMENS:  None  DRAINS:   None  EBL:  less than 50 mL  PREOPERATIVE INDICATIONS:  Madison Collins is a  65 y.o. female with comminuted fractures about the right elbow that are displaced.  The risks benefits and alternatives were discussed with the patient preoperatively including but not limited to the risks of infection, bleeding, nerve injury, cardiopulmonary complications, the need for revision surgery, among others, and the patient verbalized understanding and consented to proceed.  OPERATIVE IMPLANTS: Biomet radial head plate, rim plate; Biomet contour olecranon plate and associated screws  OPERATIVE PROCEDURE:  After receiving prophylactic antibiotics and a regional block, the patient was escorted to the operative theatre and placed in a supine position.  A surgical "time-out" was performed during which the planned procedure, proposed operative site, and the correct patient identity were compared to the operative consent and agreement confirmed by the circulating nurse according to current facility policy.  The right arm was pre-scrub with Hibiclens scrub brush before being formally prepped with ChloraPrep and draped in usual sterile fashion. A sterile tourniquet was applied. The limb was exsanguinated with Esmarch bandage and tourniquet inflated to 250 mmHg.  A direct posterior midline incision was created. Full-thickness flaps were elevated, more laterally than medially. The ulnar nerve was in its anatomic position and remain and there. Laterally, the interval between the anconeus and the ACU was exploited to  expose the radial head. The lateral ligaments were intact. Radial head was found to have a head fracture roughly 1/3 of the head. It seemed amenable to repair and this was performed with the Biomet rim plate in standard fashion with a combination of locking and nonlocking screws. Attention was then directed to the olecranon where provisional reduction was obtained after the fracture was irrigated and clot removed. It was held with a clamp and confirmed fluoroscopically to be in near anatomic reduction. The Biomet short olecranon plate was then applied, with the proximal plate tab through the longitudinal split in the extensor tendon. It was secured first to the distal shaft in the slotted hole, and then the long oblique proximal screw was placed. This was placed in a way that compressed the plate after the tab was first bent to align in the proper orientation. This compressed the fracture nicely. The most distal screw was then tightened and the remainder of the shaft screws were placed as locking screws. The 2 side tabs had the screw guides removed after they were contoured to lie alongside the cortex and no screws were placed as they were all distal to the fracture. Final images were obtained revealing satisfactory alignment of the fractures and hardware positioning, without any intra-articular hardware. The wounds were irrigated. The lateral approach was closed deeply with 2-0 Vicryl running suture. The wounds were again copiously irrigated tourniquet released. Distal hemostasis wasn't necessary skin was closed with a combination of 2-0 Vicryl deep dermal buried sutures, 3-0 Vicryl deep dermal buried sutures and staples in the skin. A bulky dressing was applied without plaster component she was placed into a sling and awakened. She was taken recovery room stable condition, breathing spontaneously  DISPOSITION: She'll  be kept overnight for pain control, released in the morning with oral analgesics and follow-up  plan for 10-15 days, at which time she should have new x-rays of the right elbow out of sling and decision made whether she would benefit from formal therapy or not.

## 2015-04-08 NOTE — Transfer of Care (Signed)
Immediate Anesthesia Transfer of Care Note  Patient: Madison Collins  Procedure(s) Performed: Procedure(s): OPEN REDUCTION INTERNAL FIXATION (ORIF) RADIAL HEAD  (Right) OPEN REDUCTION INTERNAL FIXATION (ORIF) ELBOW/OLECRANON FRACTURE (Right)  Patient Location: PACU  Anesthesia Type:Regional  Level of Consciousness: awake  Airway & Oxygen Therapy: Patient Spontanous Breathing and Patient connected to nasal cannula oxygen  Post-op Assessment: Report given to RN and Post -op Vital signs reviewed and stable  Post vital signs: Reviewed and stable  Last Vitals:  Filed Vitals:   04/08/15 1530 04/08/15 1535  BP: 131/42 128/43  Pulse: 75 76  Temp:    Resp: 17 18    Complications: No apparent anesthesia complications

## 2015-04-09 ENCOUNTER — Encounter (HOSPITAL_COMMUNITY): Payer: Self-pay | Admitting: Orthopedic Surgery

## 2015-04-09 NOTE — Discharge Summary (Signed)
Physician Discharge Summary  Patient ID: REGANN BENSING MRN: [id number] DOB/AGE: Jul 08, 1950 65 y.o.  Admit date: 04/06/2015 Discharge date: 04/09/2015  Admission Diagnoses:  Right radial head and olecranon fractures  Discharge Diagnoses:  Active Problems:   Fracture of right elbow   Fracture of elbow, closed   Past Medical History  Diagnosis Date  . Hepatitis C     IA viral load low 02/09    BX past mild fibrosis  . Hypertension   . Insomnia, unspecified   . Knee pain   . Anxiety and depression   . Bulging disc 1990's    c4-5  . Post-menopause 2010  . GERD (gastroesophageal reflux disease)   . H/O: osteoarthritis   . Arthritis   . Depression   . Cataract     right eye  . Anxiety   . Headache     migraines occasionally    Surgeries: Procedure(s): OPEN REDUCTION INTERNAL FIXATION (ORIF) RADIAL HEAD  OPEN REDUCTION INTERNAL FIXATION (ORIF) ELBOW/OLECRANON FRACTURE on  04/08/2015   Consultants (if any):    Discharged Condition: Improved  Hospital Course: WILBA GIECK is an 65 y.o. female who was admitted 04/06/2015 with a diagnosis of right elbow fracture, kept an inpatient for pain control and surgical planning, and went to the operating room on 04/08/2015 and underwent the above named procedures.  She will be discharged home in stable condition on 04-09-15.  She was given perioperative antibiotics:  Anti-infectives    Start     Dose/Rate Route Frequency Ordered Stop   04/08/15 1300  ceFAZolin (ANCEF) IVPB 2 g/50 mL premix     2 g 100 mL/hr over 30 Minutes Intravenous To [facility]Stay [facility] 04/07/15 1240 04/08/15 1546    .  She was given sequential compression devices, early ambulation  for DVT prophylaxis.  She benefited maximally from the hospital stay and there were no complications.    Recent vital signs:  Filed Vitals:   04/08/15 1838 04/08/15 2221  BP:  128/48  Pulse:  81  Temp: 97.9 F (36.6 C) 100.8 F (38.2 C)  Resp:  18    Recent  laboratory studies:  Lab Results  Component Value Date   HGB 13.2 04/02/2015   HGB 13.5 09/30/2014   HGB 12.5 01/23/2014   Lab Results  Component Value Date   WBC 8.3 04/02/2015   PLT 253 04/02/2015   Lab Results  Component Value Date   INR 0.94 12/12/2013   Lab Results  Component Value Date   NA 137 04/02/2015   K 4.1 04/02/2015   CL 104 04/02/2015   CO2 21 04/02/2015   BUN 23 04/02/2015   CREATININE 0.67 04/02/2015   GLUCOSE 92 04/02/2015    Discharge Medications:     Medication List    STOP taking these medications        HYDROcodone-acetaminophen 10-325 MG tablet  Commonly known as:  NORCO     Influenza Vac Split Quad 0.25 ML injection  Commonly known as:  FLUZONE     meloxicam 15 MG tablet  Commonly known as:  MOBIC      TAKE these medications        ALPRAZolam 0.5 MG tablet  Commonly known as:  XANAX  Take 1 tablet (0.5 mg total) by mouth 3 (three) times daily as needed. for anxiety     atorvastatin 20 MG tablet  Commonly known as:  LIPITOR  Take 1 tablet (20 mg total) by mouth daily.  citalopram 40 MG tablet  Commonly known as:  CELEXA  Take 1 tablet (40 mg total) by mouth every morning.     CLARITIN 10 MG tablet  Generic drug:  loratadine  Take 10 mg by mouth daily as needed for allergies. Reported on 04/02/2015     fluocinonide-emollient 0.05 % cream  Commonly known as:  LIDEX-E  APPLY TWICE DAILY TO FOOT DERMATITIS     hydrochlorothiazide 12.5 MG capsule  Commonly known as:  MICROZIDE  Take 1 capsule (12.5 mg total) by mouth every morning.     methocarbamol 500 MG tablet  Commonly known as:  ROBAXIN  Take 1 tablet (500 mg total) by mouth 2 (two) times daily with a meal.     naproxen 500 MG tablet  Commonly known as:  NAPROSYN  Take 1 tablet (500 mg total) by mouth 2 (two) times daily with a meal.     omeprazole 20 MG capsule  Commonly known as:  PRILOSEC  Take 1 capsule (20 mg total) by mouth every morning.      oxyCODONE-acetaminophen 5-325 MG tablet  Commonly known as:  ROXICET  Take 1-2 tablets by mouth every 4 (four) hours as needed for severe pain.     REWETTING DROPS Soln  Place 1-2 drops into both eyes as needed (for dry contacts).     SUMAtriptan 100 MG tablet  Commonly known as:  IMITREX  Take 1 tablet (100 mg total) by mouth every 2 (two) hours as needed for migraine or headache. May repeat in 2 hours if headache persists or recurs.     temazepam 30 MG capsule  Commonly known as:  RESTORIL  TAKE 1 CAPSULE BY MOUTH AT BEDTIME AS NEEDED FOR SLEEP        Diagnostic Studies: Dg Elbow 2 Views Right  04/11/15  CLINICAL DATA:  Radius and ulnar fracture EXAM: RIGHT ELBOW - 2 VIEW COMPARISON:  04/06/2015 FINDINGS: Plate and screws transfix the proximal radius fracture plate and screws transfix the proximal ulna fracture. There is anatomic alignment of the fracture fragments. The humerus is intact. Soft tissue defect is noted. IMPRESSION: ORIF proximal radius and ulna fractures. Electronically Signed   By: Marybelle Killings M.D.   On: April 11, 2015 17:53   Ct Elbow Right Wo Contrast  04/06/2015  CLINICAL DATA:  Right elbow pain status post fall. Fractures on radiographs. EXAM: CT OF THE RIGHT ELBOW WITHOUT CONTRAST TECHNIQUE: Multidetector CT imaging was performed according to the standard protocol. Multiplanar CT image reconstructions were also generated. COMPARISON:  Radiograph same date. FINDINGS: Examination was performed with the patient's arm by her side. Images are mildly degraded by beam hardening artifact. There is an impacted fracture of the radial head anteriorly, best seen on the sagittally reformatted images. The radial head is posterolaterally subluxed from the capitellum. There is a comminuted fracture of the proximal ulna which is angulated with posterior displacement of the shaft relative to the olecranon. There is intra-articular extension of this fracture adjacent to the articulation with  the trochlea, although this component is nondisplaced. There is no dislocation of the humeral ulnar joint. The distal humerus appears intact. There is soft tissue swelling around the elbow with a moderate-sized hemarthrosis. Technique does not permit adequate evaluation of the elbow tendons. IMPRESSION: 1. Impacted intra-articular fracture of the radial head with posterolateral subluxation at the radio capitellar articulation. 2. Comminuted and displaced intra-articular fracture of the proximal ulna. 3. The distal humerus appears intact. Electronically Signed   By: Gwyndolyn Saxon  Lin Landsman M.D.   On: 04/06/2015 19:09   Dg Humerus Right  04/06/2015  CLINICAL DATA:  65 year old who fell while walking her dog earlier today, injuring the right upper arm and right elbow with severe pain. Initial encounter. EXAM: RIGHT HUMERUS - 2+ VIEW COMPARISON:  None. FINDINGS: No evidence of acute fracture involving the humerus. Visualized shoulder joint intact. Acute comminuted fractures involving the olecranon and the radial head at the elbow. IMPRESSION: 1. Normal-appearing humerus. 2. Acute comminuted fractures involving the olecranon and radial head at the elbow. Dedicated elbow x-rays are suggested in further evaluation. Electronically Signed   By: Evangeline Dakin M.D.   On: 04/06/2015 15:36   Dg C-arm 1-60 Min  04/08/2015  CLINICAL DATA:  Status post open reduction and internal fixation for radius and ulnar fractures EXAM: DG C-ARM 61-120 MIN ; RIGHT ELBOW COMPARISON:  Right elbow CT April 06, 2015 FLUOROSCOPY TIME:  0 minutes 21 seconds; 3 submitted images FINDINGS: Frontal, oblique, and lateral views show screw and plate fixation through a comminuted fracture of the proximal ulna. There is also screw and plate fixation through a fracture of the proximal radius. Alignment in these areas after surgery is anatomic. No dislocation. No appreciable joint space narrowing. IMPRESSION: Screw and plate fixation for fractures of the  proximal radius. Alignment anatomic postoperative. No fracture or dislocation. Electronically Signed   By: Lowella Grip III M.D.   On: 04/08/2015 18:03    Disposition: 06-Home-Health Care Svc        Follow-up Information    Schedule an appointment as soon as possible for a visit with Jolyn Nap., MD.   Specialty:  Orthopedic Surgery   Why:  10-15 days from surgery   Contact information:   [place] [address] [phone]        Signed: Maxwell, Laquan Beier A. 04/09/2015, 12:35 AM

## 2015-05-14 ENCOUNTER — Other Ambulatory Visit: Payer: Self-pay | Admitting: Physician Assistant

## 2015-07-19 IMAGING — MG MM DIGITAL SCREENING BILATERAL
6 of 10 series · 6 of 26 positions shown · non-contrast
Comparison: Previous exam(s).

CLINICAL DATA: Screening.

EXAM:
DIGITAL SCREENING BILATERAL MAMMOGRAM WITH CAD
DIGITAL BREAST TOMOSYNTHESIS
Digital breast tomosynthesis images are acquired in two projections.
These images are reviewed in combination with the digital mammogram,
confirming the findings below.

[R CC (1 of 3)]
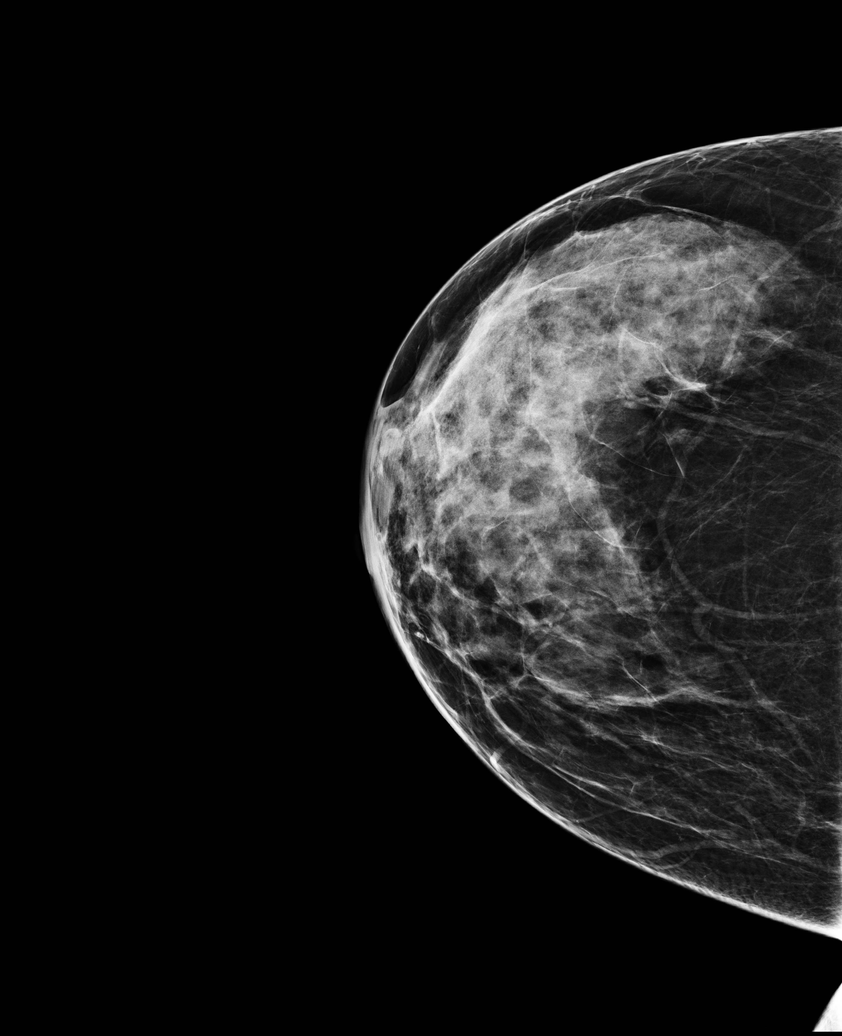

[R CC (2 of 3)]
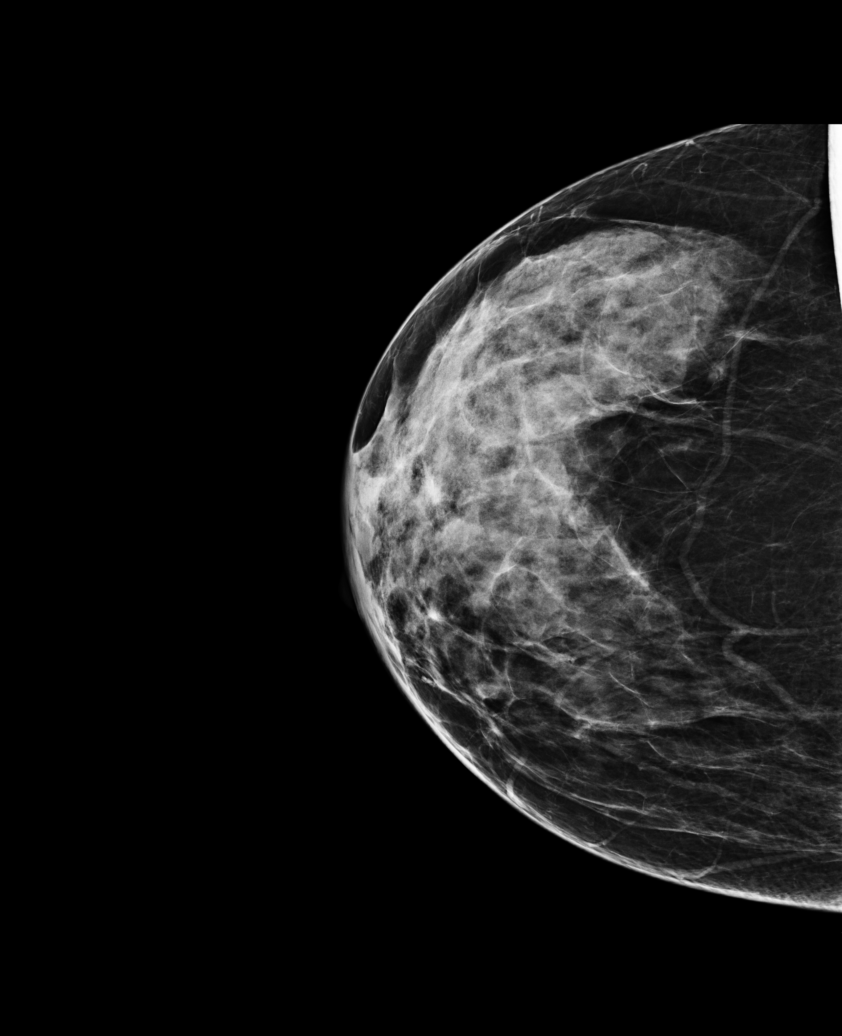

[R CC (3 of 3)]
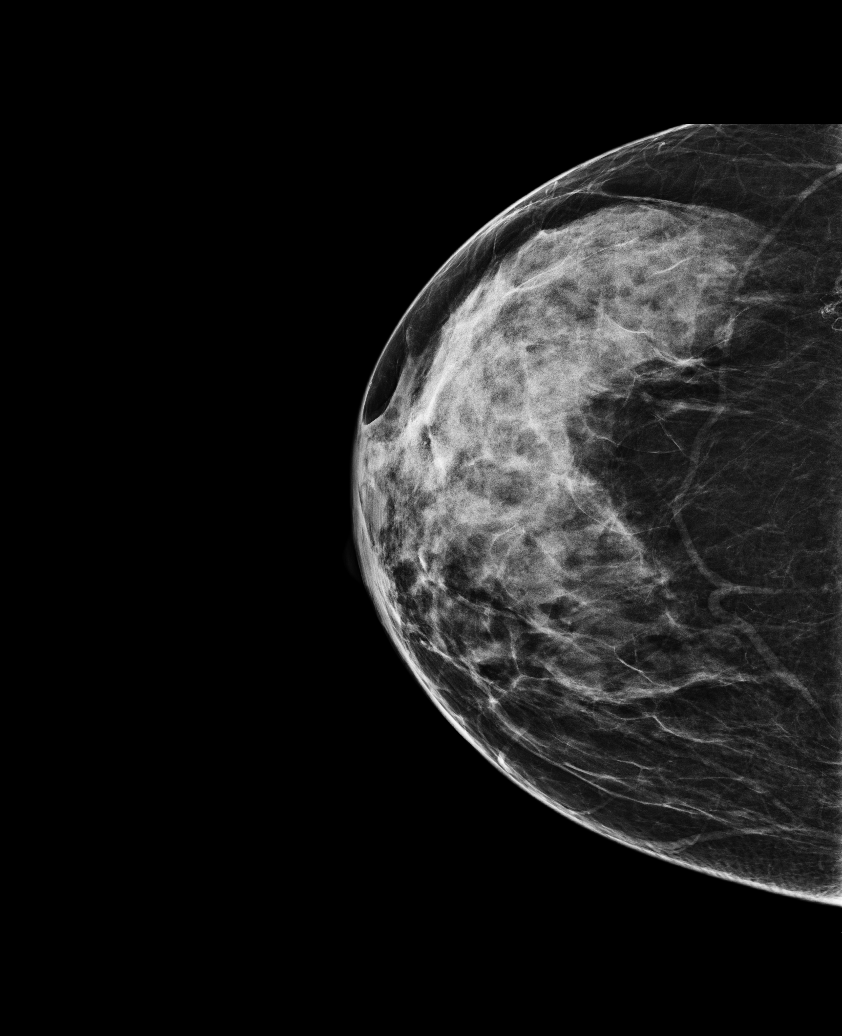

[L MLO]
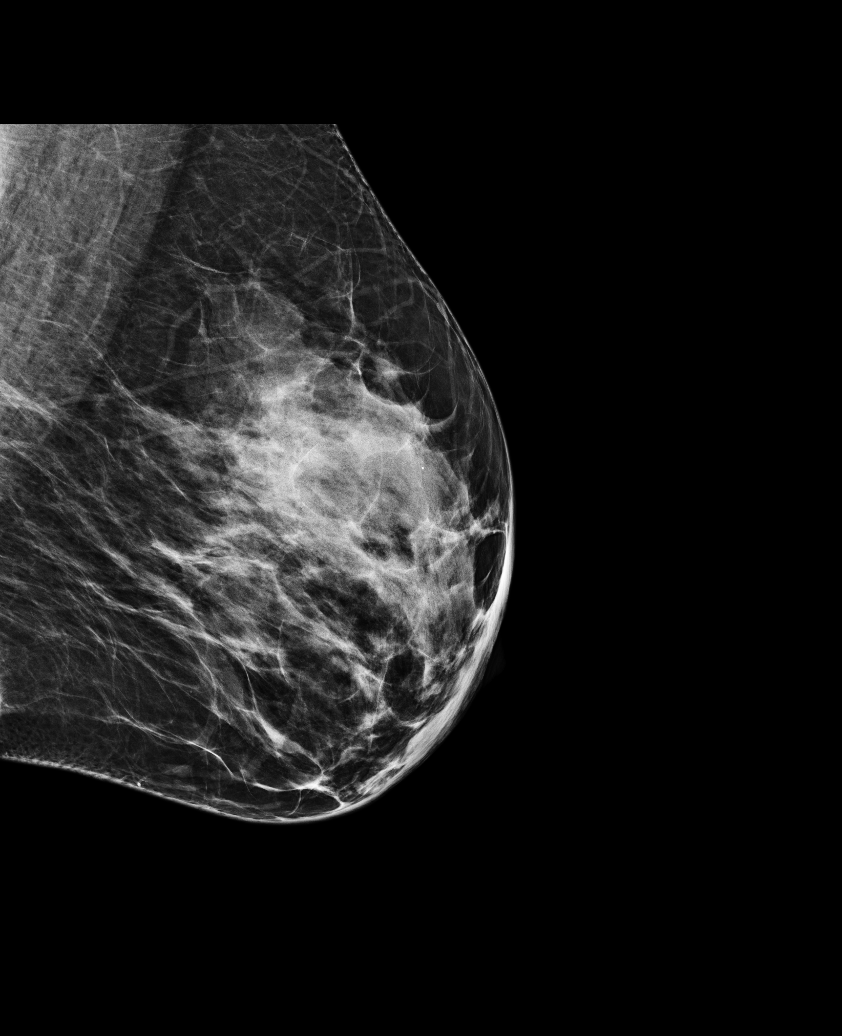

[R MLO]
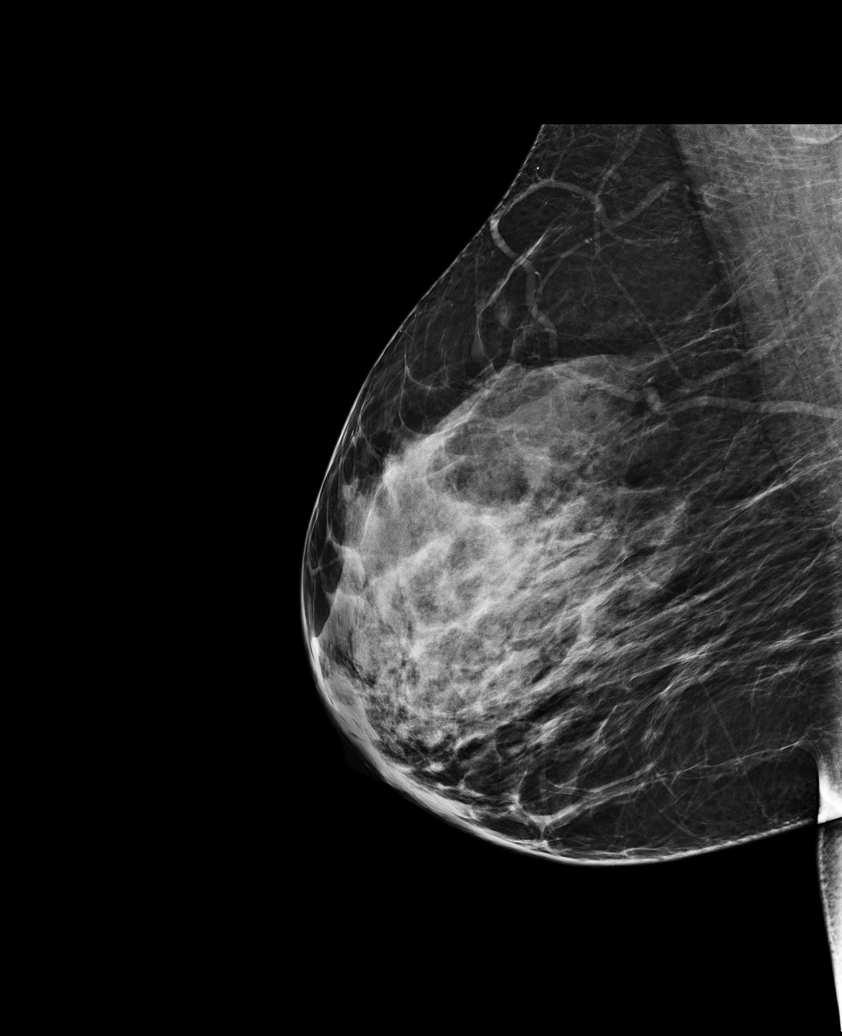

[L CC]
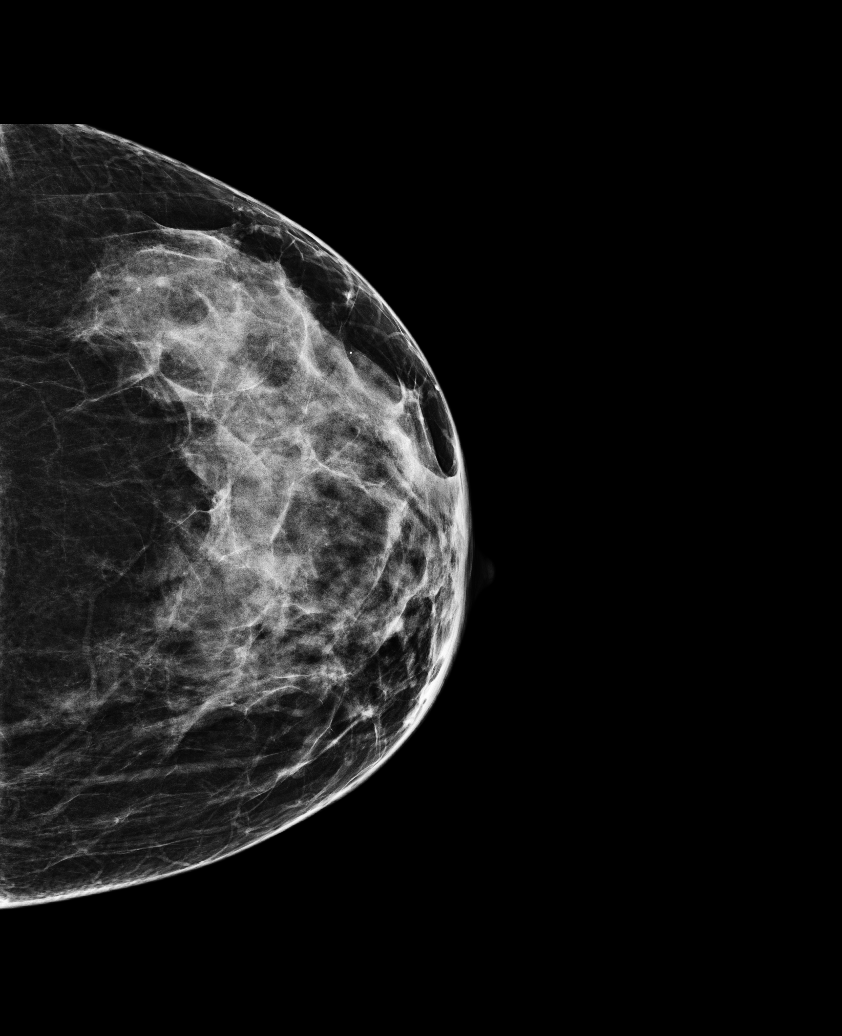

[6 of 26 positions shown; findings below may reference images not displayed]

ACR Breast Density Category c: The breasts are heterogeneously
dense, which may obscure small masses.
FINDINGS: There are no findings suspicious for malignancy. Images were
processed with CAD.
IMPRESSION: No mammographic evidence of malignancy. A result letter of this
screening mammogram will be mailed directly to the patient.

RECOMMENDATION:
Screening mammogram in one year. (Code:R9-A-IFR)

BI-RADS CATEGORY  1: Negative

## 2015-08-13 ENCOUNTER — Other Ambulatory Visit: Payer: Self-pay | Admitting: Internal Medicine

## 2015-08-13 MED ORDER — OXYCODONE-ACETAMINOPHEN 5-325 MG PO TABS: 1.0000 | ORAL_TABLET | ORAL | Status: DC | PRN

## 2015-08-13 MED ORDER — ALPRAZOLAM 0.5 MG PO TABS: 0.5000 mg | ORAL_TABLET | Freq: Three times a day (TID) | ORAL | Status: DC | PRN

## 2015-08-13 NOTE — Progress Notes (Signed)
Needs med ref to transition to new MD Meds ordered this encounter  Medications  . oxyCODONE-acetaminophen (ROXICET) 5-325 MG tablet    Sig: Take 1-2 tablets by mouth every 4 (four) hours as needed for severe pain.    Dispense:  60 tablet    Refill:  0  . ALPRAZolam (XANAX) 0.5 MG tablet    Sig: Take 1 tablet (0.5 mg total) by mouth 3 (three) times daily as needed. for anxiety    Dispense:  90 tablet    Refill:  5

## 2015-08-20 ENCOUNTER — Other Ambulatory Visit: Payer: Self-pay

## 2015-08-20 NOTE — Telephone Encounter (Signed)
Pt requesting refill for Norcor,she was referred out by Dr Doolittle,did not like new pcp,so she has now scheduled an appt with Dr Tamala Julian on the 12th of July But needs her Norcor before then    Best phone for pt is [phone]

## 2015-08-21 NOTE — Telephone Encounter (Signed)
Pended. See note below from pt.

## 2015-08-22 NOTE — Telephone Encounter (Signed)
What happened to oxycod from 6/21--was that the wrong rx and i made a mistake?? i can sign meds on Monday if needed

## 2015-08-23 NOTE — Telephone Encounter (Signed)
Pt states that she was asking for [facility] 10-325 instead of an rx for OXY.... Pt states she threw them away loose in the trash and discarded the bottle//   [phone] Please call VM ok

## 2015-08-23 NOTE — Telephone Encounter (Signed)
LMOM for pt w/detailed message asking her to [facility] and leave message for Dr Laney Pastor as to what happened to the Oxy Rx frm 6/21 and why she is asking for Norco RF now instead?

## 2015-08-25 MED ORDER — HYDROCODONE-ACETAMINOPHEN 10-325 MG PO TABS: 1.0000 | ORAL_TABLET | Freq: Four times a day (QID) | ORAL | Status: DC | PRN

## 2015-08-25 NOTE — Telephone Encounter (Signed)
Notified pt ready. 

## 2015-09-03 ENCOUNTER — Ambulatory Visit: Payer: BLUE CROSS/BLUE SHIELD | Admitting: Family Medicine

## 2015-09-09 ENCOUNTER — Encounter: Payer: Self-pay | Admitting: Family Medicine

## 2015-09-09 ENCOUNTER — Ambulatory Visit (INDEPENDENT_AMBULATORY_CARE_PROVIDER_SITE_OTHER): Payer: BLUE CROSS/BLUE SHIELD | Admitting: Family Medicine

## 2015-09-09 VITALS — BP 138/80 | HR 68 | Temp 97.8°F | Resp 16 | Ht 61.0 in | Wt 163.2 lb

## 2015-09-09 DIAGNOSIS — M5010 Cervical disc disorder with radiculopathy, unspecified cervical region: Secondary | ICD-10-CM

## 2015-09-09 DIAGNOSIS — M5000 Cervical disc disorder with myelopathy, unspecified cervical region: Secondary | ICD-10-CM

## 2015-09-09 DIAGNOSIS — R7302 Impaired glucose tolerance (oral): Secondary | ICD-10-CM

## 2015-09-09 DIAGNOSIS — M25569 Pain in unspecified knee: Secondary | ICD-10-CM | POA: Diagnosis not present

## 2015-09-09 DIAGNOSIS — G4700 Insomnia, unspecified: Secondary | ICD-10-CM

## 2015-09-09 DIAGNOSIS — G8929 Other chronic pain: Secondary | ICD-10-CM

## 2015-09-09 DIAGNOSIS — E7800 Pure hypercholesterolemia, unspecified: Secondary | ICD-10-CM

## 2015-09-09 DIAGNOSIS — S42401D Unspecified fracture of lower end of right humerus, subsequent encounter for fracture with routine healing: Secondary | ICD-10-CM

## 2015-09-09 DIAGNOSIS — I10 Essential (primary) hypertension: Secondary | ICD-10-CM

## 2015-09-09 DIAGNOSIS — F418 Other specified anxiety disorders: Secondary | ICD-10-CM | POA: Diagnosis not present

## 2015-09-09 DIAGNOSIS — K219 Gastro-esophageal reflux disease without esophagitis: Secondary | ICD-10-CM

## 2015-09-09 LAB — COMPREHENSIVE METABOLIC PANEL
ALBUMIN: 4.3 g/dL (ref 3.6–5.1)
ALT: 11 U/L (ref 6–29)
AST: 11 U/L (ref 10–35)
Alkaline Phosphatase: 70 U/L (ref 33–130)
BILIRUBIN TOTAL: 0.5 mg/dL (ref 0.2–1.2)
BUN: 15 mg/dL (ref 7–25)
CALCIUM: 9.4 mg/dL (ref 8.6–10.4)
CO2: 23 mmol/L (ref 20–31)
CREATININE: 0.75 mg/dL (ref 0.50–0.99)
Chloride: 105 mmol/L (ref 98–110)
Glucose, Bld: 116 mg/dL — ABNORMAL HIGH (ref 65–99)
Potassium: 4 mmol/L (ref 3.5–5.3)
SODIUM: 140 mmol/L (ref 135–146)
TOTAL PROTEIN: 6.9 g/dL (ref 6.1–8.1)

## 2015-09-09 LAB — POCT URINALYSIS DIP (MANUAL ENTRY)
Bilirubin, UA: NEGATIVE
GLUCOSE UA: NEGATIVE
Ketones, POC UA: NEGATIVE
NITRITE UA: NEGATIVE
PROTEIN UA: NEGATIVE
SPEC GRAV UA: 1.015
UROBILINOGEN UA: 0.2
pH, UA: 6

## 2015-09-09 LAB — CBC WITH DIFFERENTIAL/PLATELET
BASOS ABS: 62 {cells}/uL (ref 0–200)
Basophils Relative: 1 %
EOS PCT: 5 %
Eosinophils Absolute: 310 cells/uL (ref 15–500)
HEMATOCRIT: 39.2 % (ref 35.0–45.0)
HEMOGLOBIN: 13.2 g/dL (ref 11.7–15.5)
LYMPHS ABS: 2294 {cells}/uL (ref 850–3900)
Lymphocytes Relative: 37 %
MCH: 29.5 pg (ref 27.0–33.0)
MCHC: 33.7 g/dL (ref 32.0–36.0)
MCV: 87.7 fL (ref 80.0–100.0)
MONO ABS: 434 {cells}/uL (ref 200–950)
MPV: 9.8 fL (ref 7.5–12.5)
Monocytes Relative: 7 %
NEUTROS ABS: 3100 {cells}/uL (ref 1500–7800)
NEUTROS PCT: 50 %
Platelets: 255 10*3/uL (ref 140–400)
RBC: 4.47 MIL/uL (ref 3.80–5.10)
RDW: 13.8 % (ref 11.0–15.0)
WBC: 6.2 10*3/uL (ref 3.8–10.8)

## 2015-09-09 LAB — LIPID PANEL
CHOLESTEROL: 238 mg/dL — AB (ref 125–200)
HDL: 52 mg/dL (ref >=46)
LDL Cholesterol: 162 mg/dL — ABNORMAL HIGH (ref <130)
Total CHOL/HDL Ratio: 4.6 Ratio (ref <=5.0)
Triglycerides: 121 mg/dL (ref <150)
VLDL: 24 mg/dL (ref <30)

## 2015-09-09 MED ORDER — HYDROCODONE-ACETAMINOPHEN 10-325 MG PO TABS: 1.0000 | ORAL_TABLET | Freq: Four times a day (QID) | ORAL | Status: DC | PRN
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
## 2015-09-09 NOTE — Patient Instructions (Signed)
     IF you received an x-ray today, you will receive an invoice from [facility]. Please contact [facility] at [phone] with questions or concerns regarding your invoice.   IF you received labwork today, you will receive an invoice from [facility]. Please contact [facility] at [phone] with questions or concerns regarding your invoice.   Our billing staff will not be able to assist you with questions regarding bills from these companies.  You will be contacted with the lab results as soon as they are available. The fastest way to get your results is to activate your My Chart account. Instructions are located on the last page of this paperwork. If you have not heard from us regarding the results in 2 weeks, please contact this office.     We recommend that you schedule a mammogram for breast cancer screening. Typically, you do not need a referral to do this. Please contact a local imaging center to schedule your mammogram.  [facility] - [phone]  *ask for the Radiology Department [facility]) - [phone] or [phone]  [facility] - [phone] [facility] - [phone] [facility] - [phone]  *ask for the Radiology Department [facility] - [phone]  *ask for the Radiology Department [facility] - [phone]  *ask for the Mammography Department [facility] - [phone]  

## 2015-09-10 LAB — HEMOGLOBIN A1C
HEMOGLOBIN A1C: 6.1 % — AB (ref <5.7)
MEAN PLASMA GLUCOSE: 128 mg/dL

## 2015-09-19 ENCOUNTER — Other Ambulatory Visit: Payer: Self-pay

## 2015-09-19 NOTE — Telephone Encounter (Signed)
Pharm sent req for RFs of meloxicam which I have pended. This was on pt's med list at last OV with Dr Laney Pastor in Feb and Dr Laney Pastor wrote to "continue same meds", but was not on the med list at pt's visit with you Dr Tamala Julian. It may have been removed from med list at [facility]? Since you didn't review this with pt I wanted to check to see if you want pt taking this.

## 2015-09-24 MED ORDER — MELOXICAM 15 MG PO TABS: 15.0000 mg | ORAL_TABLET | Freq: Every day | ORAL | 0 refills | Status: DC

## 2015-09-24 NOTE — Telephone Encounter (Signed)
Called pt and she is taking the meloxicam daily. She apologized that she did not realize that it wasn't on the med list at the time of OV. She will discuss it further at next OV with Dr Tamala Julian. Dr Tamala Julian, [place]ier

## 2015-09-24 NOTE — Telephone Encounter (Signed)
Please call patient to clarify; does she take Mobic daily or as needed?  (I approved 90 day supply only of Mobic).

## 2015-10-12 ENCOUNTER — Telehealth: Payer: Self-pay | Admitting: Emergency Medicine

## 2015-10-12 NOTE — Telephone Encounter (Signed)
Patient has fever chills and a tender red right breast. She has a history of cellulitis. Patient feeling weak and fatigued. Temperature last night. I advised the patient to go to the emergency room since she is currently in [facility] on vacation. She agreed to do this.

## 2015-10-29 ENCOUNTER — Other Ambulatory Visit: Payer: Self-pay

## 2015-10-29 NOTE — Telephone Encounter (Signed)
Pharm reqs Rf of temazepam. Pended. Dr Tamala Julian, pt est care with you in Aug and insomnia was discussed. Last RF 04/02/15 for #30 + 5 RFs.

## 2015-10-31 ENCOUNTER — Other Ambulatory Visit: Payer: Self-pay | Admitting: Family Medicine

## 2015-10-31 MED ORDER — LOVASTATIN 20 MG PO TABS: 20.0000 mg | ORAL_TABLET | Freq: Every day | ORAL | 1 refills | Status: DC

## 2015-10-31 NOTE — Telephone Encounter (Signed)
Call patient --- I denied refill for Temazepam.  As per our conversation at her visit and per current CDC guidelines, we need to wean her off of benzos (Xanax, Temazepam) if she is maintained on daily pain medication.  She just filled Xanax #90 on 10/24/15 and on 09/18/15; thus, I did not approve Temazepam refill.

## 2015-11-03 NOTE — Telephone Encounter (Signed)
Called pt and gave her Dr Egler Caul message. Pt agreed and stated that she had called in all of her other medications and did not intend to call that one in for a RF. She verbalized understanding of why she should not be taking it and wanted me to send message back to Dr Tamala Julian that the req was a mistake.

## 2015-11-05 NOTE — Telephone Encounter (Signed)
Noted  

## 2015-11-06 ENCOUNTER — Telehealth: Payer: Self-pay

## 2015-11-06 NOTE — Telephone Encounter (Signed)
Received fax for refill of  Temazepam 30mg  caps.  Reviewed messages.  Dr. Tamala Julian refused last refill request.  [place] and advised to take off automatic refill list.  Pharmacist agreed and understood.

## 2015-12-04 ENCOUNTER — Other Ambulatory Visit: Payer: Self-pay | Admitting: Family Medicine

## 2015-12-04 DIAGNOSIS — Z1231 Encounter for screening mammogram for malignant neoplasm of breast: Secondary | ICD-10-CM

## 2015-12-15 ENCOUNTER — Other Ambulatory Visit: Payer: Self-pay

## 2015-12-15 NOTE — Telephone Encounter (Signed)
Last OV on 7/18 when this was last written for #120 with note to not fill until 10/1. I will pend next Rx to fill 10/31.

## 2015-12-15 NOTE — Telephone Encounter (Signed)
Pt requesting refill for Norco,calling early so will be processed on time   Best phone for pt is [phone]

## 2015-12-16 ENCOUNTER — Other Ambulatory Visit: Payer: Self-pay | Admitting: Family Medicine

## 2015-12-16 MED ORDER — HYDROCODONE-ACETAMINOPHEN 10-325 MG PO TABS: 1.0000 | ORAL_TABLET | Freq: Four times a day (QID) | ORAL | 0 refills | Status: DC | PRN

## 2015-12-16 NOTE — Telephone Encounter (Signed)
Called pt to notify Rx is ready.

## 2015-12-16 NOTE — Telephone Encounter (Signed)
Hydrocodone approved to fill 12-23-2015.

## 2016-01-05 ENCOUNTER — Ambulatory Visit
Admission: RE | Admit: 2016-01-05 | Discharge: 2016-01-05 | Disposition: A | Discharge status: Home / Self Care | Payer: BLUE CROSS/BLUE SHIELD | Source: Ambulatory Visit | Attending: Family Medicine | Admitting: Family Medicine

## 2016-01-05 DIAGNOSIS — Z1231 Encounter for screening mammogram for malignant neoplasm of breast: Secondary | ICD-10-CM

## 2016-01-06 ENCOUNTER — Encounter: Payer: Self-pay | Admitting: Family Medicine

## 2016-01-06 ENCOUNTER — Ambulatory Visit (INDEPENDENT_AMBULATORY_CARE_PROVIDER_SITE_OTHER): Payer: BLUE CROSS/BLUE SHIELD | Admitting: Family Medicine

## 2016-01-06 VITALS — BP 140/66 | HR 98 | Temp 98.3°F | Resp 16 | Ht 61.0 in | Wt 159.0 lb

## 2016-01-06 DIAGNOSIS — K219 Gastro-esophageal reflux disease without esophagitis: Secondary | ICD-10-CM | POA: Diagnosis not present

## 2016-01-06 DIAGNOSIS — J0100 Acute maxillary sinusitis, unspecified: Secondary | ICD-10-CM | POA: Diagnosis not present

## 2016-01-06 DIAGNOSIS — M5010 Cervical disc disorder with radiculopathy, unspecified cervical region: Secondary | ICD-10-CM | POA: Diagnosis not present

## 2016-01-06 DIAGNOSIS — E7800 Pure hypercholesterolemia, unspecified: Secondary | ICD-10-CM | POA: Diagnosis not present

## 2016-01-06 DIAGNOSIS — I10 Essential (primary) hypertension: Secondary | ICD-10-CM | POA: Diagnosis not present

## 2016-01-06 DIAGNOSIS — R7302 Impaired glucose tolerance (oral): Secondary | ICD-10-CM | POA: Diagnosis not present

## 2016-01-06 DIAGNOSIS — F418 Other specified anxiety disorders: Secondary | ICD-10-CM

## 2016-01-06 DIAGNOSIS — M1711 Unilateral primary osteoarthritis, right knee: Secondary | ICD-10-CM | POA: Diagnosis not present

## 2016-01-06 DIAGNOSIS — F5104 Psychophysiologic insomnia: Secondary | ICD-10-CM | POA: Diagnosis not present

## 2016-01-06 LAB — CBC WITH DIFFERENTIAL/PLATELET
BASOS ABS: 0 {cells}/uL (ref 0–200)
Basophils Relative: 0 %
EOS ABS: 95 {cells}/uL (ref 15–500)
Eosinophils Relative: 1 %
HCT: 40.4 % (ref 35.0–45.0)
Hemoglobin: 13.4 g/dL (ref 11.7–15.5)
LYMPHS PCT: 14 %
Lymphs Abs: 1330 cells/uL (ref 850–3900)
MCH: 29.3 pg (ref 27.0–33.0)
MCHC: 33.2 g/dL (ref 32.0–36.0)
MCV: 88.4 fL (ref 80.0–100.0)
MONOS PCT: 8 %
MPV: 9.6 fL (ref 7.5–12.5)
Monocytes Absolute: 760 cells/uL (ref 200–950)
Neutro Abs: 7315 cells/uL (ref 1500–7800)
Neutrophils Relative %: 77 %
PLATELETS: 240 10*3/uL (ref 140–400)
RBC: 4.57 MIL/uL (ref 3.80–5.10)
RDW: 13.9 % (ref 11.0–15.0)
WBC: 9.5 10*3/uL (ref 3.8–10.8)

## 2016-01-06 LAB — COMPREHENSIVE METABOLIC PANEL
ALT: 14 U/L (ref 6–29)
AST: 16 U/L (ref 10–35)
Albumin: 4.4 g/dL (ref 3.6–5.1)
Alkaline Phosphatase: 79 U/L (ref 33–130)
BUN: 13 mg/dL (ref 7–25)
CHLORIDE: 101 mmol/L (ref 98–110)
CO2: 27 mmol/L (ref 20–31)
Calcium: 9.4 mg/dL (ref 8.6–10.4)
Creat: 0.61 mg/dL (ref 0.50–0.99)
GLUCOSE: 111 mg/dL — AB (ref 65–99)
POTASSIUM: 3.9 mmol/L (ref 3.5–5.3)
Sodium: 136 mmol/L (ref 135–146)
Total Bilirubin: 0.6 mg/dL (ref 0.2–1.2)
Total Protein: 7.2 g/dL (ref 6.1–8.1)

## 2016-01-06 LAB — HEMOGLOBIN A1C
HEMOGLOBIN A1C: 5.9 % — AB (ref <5.7)
MEAN PLASMA GLUCOSE: 123 mg/dL

## 2016-01-06 LAB — LIPID PANEL
CHOL/HDL RATIO: 3.3 ratio (ref <5.0)
CHOLESTEROL: 192 mg/dL (ref <200)
HDL: 58 mg/dL (ref >50)
LDL Cholesterol: 116 mg/dL — ABNORMAL HIGH (ref <100)
Triglycerides: 89 mg/dL (ref <150)
VLDL: 18 mg/dL (ref <30)

## 2016-01-06 MED ORDER — CITALOPRAM HYDROBROMIDE 40 MG PO TABS: 40.0000 mg | ORAL_TABLET | Freq: Every morning | ORAL | 3 refills | Status: DC

## 2016-01-06 MED ORDER — OMEPRAZOLE 20 MG PO CPDR: 20.0000 mg | DELAYED_RELEASE_CAPSULE | Freq: Every morning | ORAL | 3 refills | Status: DC

## 2016-01-06 MED ORDER — LOVASTATIN 20 MG PO TABS: 20.0000 mg | ORAL_TABLET | Freq: Every day | ORAL | 1 refills | Status: DC

## 2016-01-06 MED ORDER — AMOXICILLIN 500 MG PO CAPS: 1000.0000 mg | ORAL_CAPSULE | Freq: Two times a day (BID) | ORAL | 0 refills | Status: DC

## 2016-01-06 MED ORDER — HYDROCODONE-ACETAMINOPHEN 10-325 MG PO TABS: 1.0000 | ORAL_TABLET | Freq: Four times a day (QID) | ORAL | 0 refills | Status: DC | PRN

## 2016-01-06 MED ORDER — MELOXICAM 15 MG PO TABS: 15.0000 mg | ORAL_TABLET | Freq: Every day | ORAL | 1 refills | Status: DC

## 2016-01-06 MED ORDER — GABAPENTIN 100 MG PO CAPS: 100.0000 mg | ORAL_CAPSULE | Freq: Two times a day (BID) | ORAL | 2 refills | Status: DC | PRN

## 2016-01-06 MED ORDER — HYDROCHLOROTHIAZIDE 12.5 MG PO CAPS: 12.5000 mg | ORAL_CAPSULE | Freq: Every morning | ORAL | 3 refills | Status: DC

## 2016-01-06 NOTE — Patient Instructions (Signed)
     IF you received an x-ray today, you will receive an invoice from [facility]. Please contact [facility] at [phone] with questions or concerns regarding your invoice.   IF you received labwork today, you will receive an invoice from [facility]. Please contact [facility] at [phone] with questions or concerns regarding your invoice.   Our billing staff will not be able to assist you with questions regarding bills from these companies.  You will be contacted with the lab results as soon as they are available. The fastest way to get your results is to activate your My Chart account. Instructions are located on the last page of this paperwork. If you have not heard from us regarding the results in 2 weeks, please contact this office.      

## 2016-01-06 NOTE — Progress Notes (Signed)
Subjective:    Patient ID: Madison Collins, female    DOB: 1951/02/04, 65 y.o.   MRN: [id number]  01/06/2016  Medication Refill (Norco, Meloxicam 15 mg, Omeprazole 20 mg, Alprazolam 0.5 mg, Imitrex 100 mg) and right breast ("discoloration", had a mammogram)   HPI This 65 y.o. female presents for three month follow-up for chronic pain syndrome, anxiety, migraines. Two gradnchildren lives with patient; constant draining and dripping; 11 and 6.  Has five grandchildren. Daughter just moved back from the beach; daughter makes bad choices in men.  Smaller two grandchildren with pt's daughter.  Taking Xanax 1-2 at bedtime; rarely takes during the day unless having a bad day.  Taking hydrocodone qid; has been really achy lately; weather change is a trigger.  Hurts all the time.Works in [employer], [employer] services. 12/23/15  Alprazolam 0.5mg  #90  12/23/15 Hydrocodone 10-325 #120 11/23/15 alprazolam 0.5mg  #90  11/23/15 hydrocodone 10-325 #120 10/24/15 Alprazolam 0.5mg  #90 10/24/15 hydrocodone 10-325 #120 09/23/15 hydrocodone 10-325 #120 7/27 alprazolam 0.5mg  #90 7/24 Temazepam 30mg  #23.  Coughing: onset four days ago; severe coughing in chest.  No fever/chills/sweats.  +severe HA.  +nasal congestion; clear.  +excessive coughing.  No SOB.  No v/d.   Taking Robitussin gel capsules which has been effective.  Helps at nighttime. Everyone sick at home.  Had mammogram yesterday; suffered with cellulitis of R breast after mammogram in 2014.   When at the beach this summer, poured water over head and developed cellulitis again this summer.    Glucose intolerance: has been eliminating sugar.  Going to the gym every other day.  Swimming, elliptical sitting.  Hyperlipidemia: started Lovastatin at last visit.  Cannot remember at night.  Wt Readings from Last 3 Encounters:  01/06/16 159 lb (72.1 kg)  09/09/15 163 lb 3.2 oz (74 kg)  04/02/15 168 lb (76.2 kg)   BP Readings from Last 3 Encounters:  01/06/16 140/66  09/09/15  138/80  04/09/15 (!) 132/59    Review of Systems  Constitutional: Negative for chills, diaphoresis, fatigue and fever.  HENT: Positive for congestion, postnasal drip and rhinorrhea. Negative for ear pain and sore throat.   Eyes: Negative for visual disturbance.  Respiratory: Positive for cough. Negative for shortness of breath.   Cardiovascular: Negative for chest pain, palpitations and leg swelling.  Gastrointestinal: Negative for abdominal pain, constipation, diarrhea, nausea and vomiting.  Endocrine: Negative for cold intolerance, heat intolerance, polydipsia, polyphagia and polyuria.  Musculoskeletal: Positive for arthralgias, gait problem, joint swelling, neck pain and neck stiffness.  Neurological: Positive for headaches. Negative for dizziness, tremors, seizures, syncope, facial asymmetry, speech difficulty, weakness, light-headedness and numbness.    Past Medical History:  Diagnosis Date  . Anxiety   . Anxiety and depression   . Arthritis   . Bulging disc 1990's   c4-5  . Cataract    right eye  . Depression   . GERD (gastroesophageal reflux disease)   . H/O: osteoarthritis   . Headache    migraines occasionally  . Hepatitis C    IA viral load low 02/09    BX past mild fibrosis  . Hypertension   . Insomnia, unspecified   . Knee pain   . Post-menopause 2010   Past Surgical History:  Procedure Laterality Date  . BUNIONECTOMY Right 1983  . COLONOSCOPY  10/22/13  . EYE SURGERY Right 2010   hole in macula  . KNEE SURGERY Right     x4  . ORIF ELBOW FRACTURE Right 04/08/2015   Procedure:  OPEN REDUCTION INTERNAL FIXATION (ORIF) ELBOW/OLECRANON FRACTURE;  Surgeon: Milly Jakob, MD;  Location: [facility];  Service: Orthopedics;  Laterality: Right;  . ORIF RADIAL FRACTURE Right 04/08/2015   Procedure: OPEN REDUCTION INTERNAL FIXATION (ORIF) RADIAL HEAD ;  Surgeon: Milly Jakob, MD;  Location: [facility];  Service: Orthopedics;  Laterality: Right;  . OVARIAN CYST REMOVAL    .  TOTAL KNEE ARTHROPLASTY Right 12/24/2013   Procedure: RIGHT TOTAL KNEE ARTHROPLASTY;  Surgeon: Kerin Salen, MD;  Location: [facility];  Service: Orthopedics;  Laterality: Right;  . TUBAL LIGATION  1987   Allergies  Allergen Reactions  . Latex Other (See Comments)    "Latex tape used during surgery - breaks my skin out"  . Adhesive [Tape] Rash    "per pt - surgical tape, also causes itching    Social History   Social History  . Marital status: Widowed    Spouse name: N/A  . Number of children: N/A  . Years of education: N/A   Occupational History  . Restaurant Mgr    Social History Main Topics  . Smoking status: Never Smoker  . Smokeless tobacco: Never Used  . Alcohol use Yes     Comment: a glass of wine/rarely  . Drug use: No  . Sexual activity: Yes   Other Topics Concern  . Not on file   Social History Narrative   Significant other. Education: [employer]. Exercise: Walks three times a week.   Family History  Problem Relation Age of Onset  . Cancer Mother     Lung  . Diabetes Mother   . Stroke Father   . Diabetes Father   . Colon cancer Father 12    anal cancer  . Hypertension Brother   . Bipolar disorder Daughter   . Bipolar disorder Son   . Diabetes Maternal Grandfather   . Heart disease Maternal Grandfather   . Emphysema Paternal Grandmother   . Cancer Paternal Grandfather     stomach       Objective:    BP 140/66   Pulse 98   Temp 98.3 F (36.8 C) (Oral)   Resp 16   Ht 5\' 1"  (1.549 m)   Wt 159 lb (72.1 kg)   SpO2 94%   BMI 30.04 kg/m  Physical Exam  Constitutional: She is oriented to person, place, and time. She appears well-developed and well-nourished. No distress.  HENT:  Head: Normocephalic and atraumatic.  Right Ear: Tympanic membrane, external ear and ear canal normal.  Left Ear: Tympanic membrane, external ear and ear canal normal.  Nose: Mucosal edema and rhinorrhea present.  Mouth/Throat: Uvula is midline, oropharynx is clear and moist  and mucous membranes are normal.  Eyes: Conjunctivae and EOM are normal. Pupils are equal, round, and reactive to light.  Neck: Normal range of motion. Neck supple. Carotid bruit is not present. No thyromegaly present.  Cardiovascular: Normal rate, regular rhythm, normal heart sounds and intact distal pulses.  Exam reveals no gallop and no friction rub.   No murmur heard. Pulmonary/Chest: Effort normal and breath sounds normal. She has no wheezes. She has no rales.  Abdominal: Soft. Bowel sounds are normal. She exhibits no distension and no mass. There is no tenderness. There is no rebound and no guarding.  Lymphadenopathy:    She has no cervical adenopathy.  Neurological: She is alert and oriented to person, place, and time. No cranial nerve deficit.  Skin: Skin is warm and dry. No rash noted. She is  not diaphoretic. No erythema. No pallor.  Psychiatric: She has a normal mood and affect. Her behavior is normal. Judgment and thought content normal. Cognition and memory are normal.        Assessment & Plan:   1. Glucose intolerance (impaired glucose tolerance)   2. Hypercholesterolemia   3. Acute non-recurrent maxillary sinusitis   4. Essential hypertension   5. Gastroesophageal reflux disease without esophagitis   6. Arthritis of knee, right   7. Cervical disc syndrome   8. Psychophysiological insomnia   9. Depression with anxiety    -obtain labs with addition of Lovastatin at last visit. -continue to wean off of Xanax over upcoming month; rx for Gabapentin provided to take 1-2 bid for chronic pain and anxiety. Citalopram 40mg  daily for anxiety/depression.  Has discontinued Temazepam. -refills of hydrocodone provided for the next three months; will warrant UDS and to sign pain contract at next visit.  For chronic pain, add Neurontin and increase dose at each visit; continue Meloxicam as well.   -Amoxicillin for acute sinusitis.  Recommend Flonase and Mucinex DM.  Orders Placed This  Encounter  Procedures  . CBC with Differential/Platelet  . Comprehensive metabolic panel    Order Specific Question:   Has the patient fasted?    Answer:   Yes  . Lipid panel    Order Specific Question:   Has the patient fasted?    Answer:   Yes  . Hemoglobin A1c   Meds ordered this encounter  Medications  . DISCONTD: HYDROcodone-acetaminophen (NORCO) 10-325 MG tablet    Sig: Take 1 tablet by mouth every 6 (six) hours as needed.    Dispense:  120 tablet    Refill:  0    DO NOT FILL UNTIL 01-22-2016  . DISCONTD: HYDROcodone-acetaminophen (NORCO) 10-325 MG tablet    Sig: Take 1 tablet by mouth every 6 (six) hours as needed.    Dispense:  120 tablet    Refill:  0    DO NOT FILL UNTIL 02-20-2016  . HYDROcodone-acetaminophen (NORCO) 10-325 MG tablet    Sig: Take 1 tablet by mouth every 6 (six) hours as needed.    Dispense:  120 tablet    Refill:  0    DO NOT FILL UNTIL 03-21-2016  . citalopram (CELEXA) 40 MG tablet    Sig: Take 1 tablet (40 mg total) by mouth every morning.    Dispense:  90 tablet    Refill:  3  . hydrochlorothiazide (MICROZIDE) 12.5 MG capsule    Sig: Take 1 capsule (12.5 mg total) by mouth every morning.    Dispense:  90 capsule    Refill:  3  . lovastatin (MEVACOR) 20 MG tablet    Sig: Take 1 tablet (20 mg total) by mouth at bedtime.    Dispense:  90 tablet    Refill:  1  . meloxicam (MOBIC) 15 MG tablet    Sig: Take 1 tablet (15 mg total) by mouth daily.    Dispense:  90 tablet    Refill:  1  . omeprazole (PRILOSEC) 20 MG capsule    Sig: Take 1 capsule (20 mg total) by mouth every morning.    Dispense:  90 capsule    Refill:  3  . gabapentin (NEURONTIN) 100 MG capsule    Sig: Take 1-2 capsules (100-200 mg total) by mouth 2 (two) times daily as needed.    Dispense:  90 capsule    Refill:  2  . amoxicillin (AMOXIL)  500 MG capsule    Sig: Take 2 capsules (1,000 mg total) by mouth 2 (two) times daily.    Dispense:  40 capsule    Refill:  0     Return in about 3 months (around 04/07/2016) for recheck.   Madison Collins Elayne Guerin, M.D. [facility] [address] [place], [place]  [address] [phone] phone [phone] fax

## 2016-01-07 ENCOUNTER — Other Ambulatory Visit: Payer: Self-pay | Admitting: Family Medicine

## 2016-01-07 DIAGNOSIS — R928 Other abnormal and inconclusive findings on diagnostic imaging of breast: Secondary | ICD-10-CM

## 2016-01-14 ENCOUNTER — Other Ambulatory Visit: Payer: Self-pay | Admitting: Family Medicine

## 2016-01-14 ENCOUNTER — Ambulatory Visit
Admission: RE | Admit: 2016-01-14 | Discharge: 2016-01-14 | Disposition: A | Discharge status: Home / Self Care | Payer: BLUE CROSS/BLUE SHIELD | Source: Ambulatory Visit | Attending: Family Medicine | Admitting: Family Medicine

## 2016-01-14 DIAGNOSIS — N6489 Other specified disorders of breast: Secondary | ICD-10-CM

## 2016-01-14 DIAGNOSIS — R928 Other abnormal and inconclusive findings on diagnostic imaging of breast: Secondary | ICD-10-CM

## 2016-01-19 ENCOUNTER — Telehealth: Payer: Self-pay

## 2016-01-19 NOTE — Telephone Encounter (Signed)
Leigh from the [facility] called regarding patient's biopsy results.  She stated they are not as expected from the mammogram and radiologist recommends that she have a bi-lateral breast MRI.  She states that she will call the patient and let her know.  I told her you were not in the office today, and she said it is not emergent and can be addressed when you return.  I just wanted to send you this message today, to make you aware.  Thank you!

## 2016-01-21 ENCOUNTER — Telehealth: Payer: Self-pay

## 2016-01-21 DIAGNOSIS — N6489 Other specified disorders of breast: Secondary | ICD-10-CM

## 2016-01-21 DIAGNOSIS — R928 Other abnormal and inconclusive findings on diagnostic imaging of breast: Secondary | ICD-10-CM

## 2016-01-21 NOTE — Telephone Encounter (Signed)
The breast center is calling on behalf of the patient.  They said they needed Dr. Antonellis Caul orders to preform the breast exam.  Please advise!   Breast center nurse desk:[phone]

## 2016-01-22 NOTE — Telephone Encounter (Signed)
Order faxed to scheduling department

## 2016-01-23 NOTE — Telephone Encounter (Signed)
Please call the Breast Center.  Which MRI breast does she need ordered? With or without contrast or both?  Please have her place order for me to cosign.

## 2016-01-23 NOTE — Telephone Encounter (Signed)
See Dr Vicknair Caul note from 11/27 message. Placed order as req'd below and called Breast Center to clarify correct Dxs to assoc w/order.

## 2016-01-23 NOTE — Telephone Encounter (Signed)
Leigh from [facility] is calling back stating that she received a fax and it had everything but the MRI order. Marliss Czar states that the order has to be sent through Epic for a bilateral breast MRI with and without contrast. The code is 1143.  Leigh requested that we call and speak to either her or Jeani Hawking once completed. [phone]

## 2016-01-27 NOTE — Telephone Encounter (Signed)
See later note. Taken care of by [facility]

## 2016-02-04 ENCOUNTER — Ambulatory Visit
Admission: RE | Admit: 2016-02-04 | Discharge: 2016-02-04 | Disposition: A | Discharge status: Home / Self Care | Payer: BLUE CROSS/BLUE SHIELD | Source: Ambulatory Visit | Attending: Family Medicine | Admitting: Family Medicine

## 2016-02-04 MED ORDER — GADOBENATE DIMEGLUMINE 529 MG/ML IV SOLN
14.0000 mL | Freq: Once | INTRAVENOUS | Status: AC | PRN
  Administered 2016-02-04: 14 mL via INTRAVENOUS

## 2016-02-05 ENCOUNTER — Telehealth: Payer: Self-pay | Admitting: Emergency Medicine

## 2016-02-05 ENCOUNTER — Other Ambulatory Visit: Payer: Self-pay | Admitting: Family Medicine

## 2016-02-05 DIAGNOSIS — N6489 Other specified disorders of breast: Secondary | ICD-10-CM

## 2016-02-05 NOTE — Telephone Encounter (Signed)
Pt called in requesting MRI breast results Results given by Dr. Tamala Julian. Pt advised to f/u with breast center in 3-4 weeks

## 2016-02-06 ENCOUNTER — Telehealth: Payer: Self-pay

## 2016-02-10 ENCOUNTER — Other Ambulatory Visit: Payer: Self-pay | Admitting: Family Medicine

## 2016-04-07 ENCOUNTER — Ambulatory Visit: Payer: BLUE CROSS/BLUE SHIELD | Admitting: Family Medicine

## 2016-04-20 ENCOUNTER — Ambulatory Visit: Payer: BLUE CROSS/BLUE SHIELD | Admitting: Family Medicine

## 2016-04-21 ENCOUNTER — Ambulatory Visit (INDEPENDENT_AMBULATORY_CARE_PROVIDER_SITE_OTHER): Payer: BLUE CROSS/BLUE SHIELD | Admitting: Family Medicine

## 2016-04-21 ENCOUNTER — Encounter: Payer: Self-pay | Admitting: Family Medicine

## 2016-04-21 VITALS — BP 130/68 | HR 75 | Temp 97.9°F | Resp 18 | Ht 61.0 in | Wt 164.2 lb

## 2016-04-21 DIAGNOSIS — M25561 Pain in right knee: Secondary | ICD-10-CM

## 2016-04-21 DIAGNOSIS — G8929 Other chronic pain: Secondary | ICD-10-CM | POA: Diagnosis not present

## 2016-04-21 DIAGNOSIS — M1711 Unilateral primary osteoarthritis, right knee: Secondary | ICD-10-CM

## 2016-04-21 DIAGNOSIS — Z23 Encounter for immunization: Secondary | ICD-10-CM | POA: Diagnosis not present

## 2016-04-21 DIAGNOSIS — F5104 Psychophysiologic insomnia: Secondary | ICD-10-CM | POA: Diagnosis not present

## 2016-04-21 DIAGNOSIS — M5010 Cervical disc disorder with radiculopathy, unspecified cervical region: Secondary | ICD-10-CM

## 2016-04-21 DIAGNOSIS — F418 Other specified anxiety disorders: Secondary | ICD-10-CM | POA: Diagnosis not present

## 2016-04-21 MED ORDER — HYDROCODONE-ACETAMINOPHEN 10-325 MG PO TABS: 1.0000 | ORAL_TABLET | Freq: Four times a day (QID) | ORAL | 0 refills | Status: DC | PRN

## 2016-04-21 MED ORDER — GABAPENTIN 300 MG PO CAPS: 300.0000 mg | ORAL_CAPSULE | Freq: Three times a day (TID) | ORAL | 5 refills | Status: DC

## 2016-04-21 MED ORDER — MELOXICAM 15 MG PO TABS: 15.0000 mg | ORAL_TABLET | Freq: Every day | ORAL | 1 refills | Status: DC

## 2016-04-21 NOTE — Patient Instructions (Signed)
     IF you received an x-ray today, you will receive an invoice from [facility]. Please contact [facility] at [phone] with questions or concerns regarding your invoice.   IF you received labwork today, you will receive an invoice from [facility]. Please contact [facility] at [phone] with questions or concerns regarding your invoice.   Our billing staff will not be able to assist you with questions regarding bills from these companies.  You will be contacted with the lab results as soon as they are available. The fastest way to get your results is to activate your My Chart account. Instructions are located on the last page of this paperwork. If you have not heard from us regarding the results in 2 weeks, please contact this office.     

## 2016-04-21 NOTE — Progress Notes (Signed)
Subjective:    Patient ID: Madison Collins, female    DOB: 10-29-1950, 66 y.o.   MRN: [id number]  04/21/2016  Medication Refill (Gabapentin, Norco, Meloxicam )   HPI This 66 y.o. female presents for three month follow-up of chronic pain syndrome, anxiety, hypertension, hypercholesterolemia, glucose intolerance.  Not able to keeping weight off and exercise.  Has granddaughter with influenza right now.  Not sleeping very well; taking Melatonin.  Waking up three hours after onset.  Has two grandchildren who sleep with pt.  Very clingy.  Trying not to read with Kindle at night.  Taking Gabapentin 1 every day; then two at night.  Has increased to 2 bid.  Not sure if helping with stress.  Must deal with daughter. No side effects.  Pain is bad this time of year.   Barometric pressure triggers pain; damp cold is bad.  Knees and elbow R.  Also, B thumb oa.  [place] Controlled Substance Registry:  03/23/16 Hydrocodone 10/325 #120  Madison Collins 02/20/16 hydrocodone 10/325 120 Madison Collins 01/22/16 alprazolam 0.5mg  #90 Madison Collins 11/30 hydrocodone 10/325 120 Madison Collins 10/31 alprazolam 0.5mg  #90 Madison Collins  Immunization History  Administered Date(s) Administered  . Hepatitis B, adult 06/04/2013, 07/03/2013  . Influenza Split 11/25/2010, 11/17/2011  . Influenza,inj,Quad PF,36+ Mos 11/08/2012, 04/02/2015, 04/21/2016  . Influenza-Unspecified 11/05/2013  . Pneumococcal Conjugate-13 10/24/2006  . Pneumococcal Polysaccharide-23 12/26/2013  . Td 02/23/2007  . Zoster 09/22/2013   BP Readings from Last 3 Encounters:  04/21/16 130/68  01/06/16 140/66  09/09/15 138/80   Wt Readings from Last 3 Encounters:  04/21/16 164 lb 3.2 oz (74.5 kg)  01/06/16 159 lb (72.1 kg)  09/09/15 163 lb 3.2 oz (74 kg)    Review of Systems  Constitutional: Negative for chills, diaphoresis, fatigue and fever.  Eyes: Negative for visual disturbance.  Respiratory: Negative for cough and shortness of breath.   Cardiovascular: Negative for  chest pain, palpitations and leg swelling.  Gastrointestinal: Negative for abdominal pain, constipation, diarrhea, nausea and vomiting.  Endocrine: Negative for cold intolerance, heat intolerance, polydipsia, polyphagia and polyuria.  Musculoskeletal: Positive for arthralgias and myalgias.  Neurological: Negative for dizziness, tremors, seizures, syncope, facial asymmetry, speech difficulty, weakness, light-headedness, numbness and headaches.  Psychiatric/Behavioral: Positive for dysphoric mood and sleep disturbance. Negative for self-injury and suicidal ideas. The patient is nervous/anxious.     Past Medical History:  Diagnosis Date  . Anxiety   . Anxiety and depression   . Arthritis   . Bulging disc 1990's   c4-5  . Cataract    right eye  . Depression   . GERD (gastroesophageal reflux disease)   . H/O: osteoarthritis   . Headache    migraines occasionally  . Hepatitis C    IA viral load low 02/09    BX past mild fibrosis  . Hypertension   . Insomnia, unspecified   . Knee pain   . Post-menopause 2010   Past Surgical History:  Procedure Laterality Date  . BUNIONECTOMY Right 1983  . COLONOSCOPY  10/22/13  . EYE SURGERY Right 2010   hole in macula  . KNEE SURGERY Right     x4  . ORIF ELBOW FRACTURE Right 04/08/2015   Procedure: OPEN REDUCTION INTERNAL FIXATION (ORIF) ELBOW/OLECRANON FRACTURE;  Surgeon: Milly Jakob, MD;  Location: [facility];  Service: Orthopedics;  Laterality: Right;  . ORIF RADIAL FRACTURE Right 04/08/2015   Procedure: OPEN REDUCTION INTERNAL FIXATION (ORIF) RADIAL HEAD ;  Surgeon: Milly Jakob, MD;  Location: [facility];  Service:  Orthopedics;  Laterality: Right;  . OVARIAN CYST REMOVAL    . TOTAL KNEE ARTHROPLASTY Right 12/24/2013   Procedure: RIGHT TOTAL KNEE ARTHROPLASTY;  Surgeon: Kerin Salen, MD;  Location: [facility];  Service: Orthopedics;  Laterality: Right;  . TUBAL LIGATION  1987   Allergies  Allergen Reactions  . Latex Other (See Comments)    "Latex  tape used during surgery - breaks my skin out"  . Adhesive [Tape] Rash    "per pt - surgical tape, also causes itching    Social History   Social History  . Marital status: Widowed    Spouse name: N/A  . Number of children: N/A  . Years of education: N/A   Occupational History  . Restaurant Mgr    Social History Main Topics  . Smoking status: Never Smoker  . Smokeless tobacco: Never Used  . Alcohol use Yes     Comment: a glass of wine/rarely  . Drug use: No  . Sexual activity: Yes   Other Topics Concern  . Not on file   Social History Narrative   Significant other. Education: [employer]. Exercise: Walks three times a week.   Family History  Problem Relation Age of Onset  . Cancer Mother     Lung  . Diabetes Mother   . Stroke Father   . Diabetes Father   . Colon cancer Father 48    anal cancer  . Hypertension Brother   . Bipolar disorder Daughter   . Bipolar disorder Son   . Diabetes Maternal Grandfather   . Heart disease Maternal Grandfather   . Emphysema Paternal Grandmother   . Cancer Paternal Grandfather     stomach       Objective:    BP 130/68   Pulse 75   Temp 97.9 F (36.6 C) (Oral)   Resp 18   Ht 5\' 1"  (1.549 m)   Wt 164 lb 3.2 oz (74.5 kg)   SpO2 95%   BMI 31.03 kg/m  Physical Exam  Constitutional: She is oriented to person, place, and time. She appears well-developed and well-nourished. No distress.  HENT:  Head: Normocephalic and atraumatic.  Right Ear: External ear normal.  Left Ear: External ear normal.  Nose: Nose normal.  Mouth/Throat: Oropharynx is clear and moist.  Eyes: Conjunctivae and EOM are normal. Pupils are equal, round, and reactive to light.  Neck: Normal range of motion. Neck supple. Carotid bruit is not present. No thyromegaly present.  Cardiovascular: Normal rate, regular rhythm, normal heart sounds and intact distal pulses.  Exam reveals no gallop and no friction rub.   No murmur heard. Pulmonary/Chest: Effort normal  and breath sounds normal. She has no wheezes. She has no rales.  Abdominal: Soft. Bowel sounds are normal. She exhibits no distension and no mass. There is no tenderness. There is no rebound and no guarding.  Lymphadenopathy:    She has no cervical adenopathy.  Neurological: She is alert and oriented to person, place, and time. No cranial nerve deficit.  Skin: Skin is warm and dry. No rash noted. She is not diaphoretic. No erythema. No pallor.  Psychiatric: She has a normal mood and affect. Her behavior is normal.       Depression screen [facility] 2/9 04/21/2016 01/06/2016 09/09/2015 04/02/2015 10/15/2014  Decreased Interest 0 0 0 0 0  Down, Depressed, Hopeless 0 0 0 1 0  PHQ - 2 Score 0 0 0 1 0  Altered sleeping - - - - -  Tired, decreased energy - - - - -  Change in appetite - - - - -  Feeling bad or failure about yourself  - - - - -  Trouble concentrating - - - - -  Moving slowly or fidgety/restless - - - - -  Suicidal thoughts - - - - -  PHQ-9 Score - - - - -   Fall Risk  04/21/2016 01/06/2016 09/09/2015 04/02/2015 10/15/2014  Falls in the past year? No Yes Yes No Yes  Number falls in past yr: - - 1 - 2 or more  Injury with Fall? - - Yes - Yes  Risk Factor Category  - - - - High Fall Risk  Risk for fall due to : - - - - History of fall(s)  Follow up - - - - Falls evaluation completed     Assessment & Plan:   1. Chronic pain of right knee   2. Depression with anxiety   3. Psychophysiological insomnia   4. Cervical disc syndrome   5. Arthritis of knee, right   6. Need for prophylactic vaccination and inoculation against influenza    -stable; refills of hydrocodone provided; pain management UDS obtained per CDC guidelines; reviewed pain management contract with pt and signed and in chart.  Increase Gabapentin to 300mg  tid. -has successfully weaned off of Xanax; last rx 01/22/16; increase Gabapentin to 300mg  tid for anxiety treatment as well.   -refill of Mobic and Fluocinonide cream  proivded.   Orders Placed This Encounter  Procedures  . Flu Vaccine QUAD 36+ mos IM  . Pain Management Screening Profile (10S)   Meds ordered this encounter  Medications  . DISCONTD: fluocinonide cream (LIDEX) 0.05 %    Refill:  2  . gabapentin (NEURONTIN) 300 MG capsule    Sig: Take 1 capsule (300 mg total) by mouth 3 (three) times daily.    Dispense:  90 capsule    Refill:  5  . meloxicam (MOBIC) 15 MG tablet    Sig: Take 1 tablet (15 mg total) by mouth daily.    Dispense:  90 tablet    Refill:  1  . DISCONTD: HYDROcodone-acetaminophen (NORCO) 10-325 MG tablet    Sig: Take 1 tablet by mouth every 6 (six) hours as needed.    Dispense:  120 tablet    Refill:  0  . DISCONTD: HYDROcodone-acetaminophen (NORCO) 10-325 MG tablet    Sig: Take 1 tablet by mouth every 6 (six) hours as needed.    Dispense:  120 tablet    Refill:  0    DO NOT FILL FOR 30 DAYS AFTER PRESCRIBED.  Marland Kitchen HYDROcodone-acetaminophen (NORCO) 10-325 MG tablet    Sig: Take 1 tablet by mouth every 6 (six) hours as needed.    Dispense:  120 tablet    Refill:  0    DO NOT FILL FOR 60 DAYS AFTER PRESCRIBED.    Return in about 3 months (around 07/19/2016) for complete physical examiniation.   Annessa Satre Elayne Guerin, M.D. Primary Care at [facility] [address] [place], [place]  [address] [phone] phone [phone] fax

## 2016-04-22 LAB — PMP SCREEN PROFILE (10S), URINE
AMPHETAMINE SCRN UR: NEGATIVE ng/mL
BARBITURATE SCRN UR: NEGATIVE ng/mL
Benzodiazepine Screen, Urine: NEGATIVE ng/mL
CANNABINOIDS UR QL SCN: NEGATIVE ng/mL
COCAINE(METAB.) SCREEN, URINE: NEGATIVE ng/mL
CREATININE(CRT), U: 218.9 mg/dL (ref 20.0–300.0)
Methadone Scn, Ur: NEGATIVE ng/mL
Opiate Scrn, Ur: POSITIVE ng/mL
Oxycodone+Oxymorphone Ur Ql Scn: NEGATIVE ng/mL
PCP Scrn, Ur: NEGATIVE ng/mL
PH UR, DRUG SCRN: 7.3 (ref 4.5–8.9)
PROPOXYPHENE SCREEN: NEGATIVE ng/mL

## 2016-06-03 ENCOUNTER — Ambulatory Visit (INDEPENDENT_AMBULATORY_CARE_PROVIDER_SITE_OTHER): Payer: BLUE CROSS/BLUE SHIELD | Admitting: Urgent Care

## 2016-06-03 VITALS — BP 140/62 | HR 78 | Temp 98.0°F | Resp 16 | Ht 61.0 in | Wt 167.2 lb

## 2016-06-03 DIAGNOSIS — I10 Essential (primary) hypertension: Secondary | ICD-10-CM | POA: Diagnosis not present

## 2016-06-03 DIAGNOSIS — Z9109 Other allergy status, other than to drugs and biological substances: Secondary | ICD-10-CM | POA: Diagnosis not present

## 2016-06-03 DIAGNOSIS — Z23 Encounter for immunization: Secondary | ICD-10-CM

## 2016-06-03 DIAGNOSIS — R05 Cough: Secondary | ICD-10-CM | POA: Diagnosis not present

## 2016-06-03 DIAGNOSIS — R0981 Nasal congestion: Secondary | ICD-10-CM

## 2016-06-03 DIAGNOSIS — R059 Cough, unspecified: Secondary | ICD-10-CM

## 2016-06-03 DIAGNOSIS — J3489 Other specified disorders of nose and nasal sinuses: Secondary | ICD-10-CM | POA: Diagnosis not present

## 2016-06-03 DIAGNOSIS — R030 Elevated blood-pressure reading, without diagnosis of hypertension: Secondary | ICD-10-CM

## 2016-06-03 LAB — POCT CBC
GRANULOCYTE PERCENT: 47.4 % (ref 37–80)
HEMATOCRIT: 38.4 % (ref 37.7–47.9)
Hemoglobin: 13.4 g/dL (ref 12.2–16.2)
Lymph, poc: 2.2 (ref 0.6–3.4)
MCH, POC: 30.3 pg (ref 27–31.2)
MCHC: 35 g/dL (ref 31.8–35.4)
MCV: 86.5 fL (ref 80–97)
MID (cbc): 0.7 (ref 0–0.9)
MPV: 7.6 fL (ref 0–99.8)
POC GRANULOCYTE: 2.6 (ref 2–6.9)
POC LYMPH %: 40.5 % (ref 10–50)
POC MID %: 12.1 % — AB (ref 0–12)
Platelet Count, POC: 208 10*3/uL (ref 142–424)
RBC: 4.44 M/uL (ref 4.04–5.48)
RDW, POC: 13.4 %
WBC: 5.4 10*3/uL (ref 4.6–10.2)

## 2016-06-03 MED ORDER — ZOSTER VAC RECOMB ADJUVANTED 50 MCG/0.5ML IM SUSR: 0.5000 mL | Freq: Once | INTRAMUSCULAR | 0 refills | Status: AC

## 2016-06-03 MED ORDER — HYDROCODONE-HOMATROPINE 5-1.5 MG/5ML PO SYRP: 5.0000 mL | ORAL_SOLUTION | Freq: Every evening | ORAL | 0 refills | Status: DC | PRN

## 2016-06-03 MED ORDER — BENZONATATE 100 MG PO CAPS: 100.0000 mg | ORAL_CAPSULE | Freq: Three times a day (TID) | ORAL | 0 refills | Status: DC | PRN

## 2016-06-03 MED ORDER — FLUTICASONE PROPIONATE 50 MCG/ACT NA SUSP: 2.0000 | Freq: Every day | NASAL | 11 refills | Status: DC

## 2016-06-03 MED ORDER — CETIRIZINE HCL 10 MG PO TABS: 10.0000 mg | ORAL_TABLET | Freq: Every day | ORAL | 11 refills | Status: DC

## 2016-06-03 MED ORDER — AMOXICILLIN 500 MG PO CAPS: 500.0000 mg | ORAL_CAPSULE | Freq: Two times a day (BID) | ORAL | 0 refills | Status: DC

## 2016-06-03 NOTE — Progress Notes (Signed)
MRN: [id number] DOB: [birth year]  Subjective:   Madison Collins is a 66 y.o. female presenting for chief complaint of Sinusitis (Ear pain, bilateral, facial pain, nasal congestion)  Reports 5 day history of sinus pain, sinus congestion, facial pain, bilateral ear pain, L>R, ear popping, mildly productive cough, post-nasal drainage. Cough elicits sore throat and mild chest pain. Has tried Robitussin gel caps, otc cough drops. Admits history of seasonal allergies, has used Claritin for her spring allergies but has not yet started this. Denies fever, ear drainage, shob, wheezing, n/v, abdominal pain, rashes. Denies history of asthma. Denies smoking cigarettes.  Madison Collins has a current medication list which includes the following prescription(s): citalopram, fluocinonide-emollient, gabapentin, hydrochlorothiazide, hydrocodone-acetaminophen, lovastatin, meloxicam, omeprazole, and sumatriptan. Also is allergic to latex and adhesive [tape].  Madison Collins  has a past medical history of Anxiety; Anxiety and depression; Arthritis; Bulging disc (1990's); Cataract; Depression; GERD (gastroesophageal reflux disease); H/O: osteoarthritis; Headache; Hepatitis C; Hypertension; Insomnia, unspecified; Knee pain; and Post-menopause (2010). Also  has a past surgical history that includes Tubal ligation (1987); Knee surgery (Right); Eye surgery (Right, 2010); Ovarian cyst removal; Bunionectomy (Right, 1983); Colonoscopy (10/22/13); Total knee arthroplasty (Right, 12/24/2013); ORIF radial fracture (Right, 04/08/2015); and ORIF elbow fracture (Right, 04/08/2015).  Objective:   Vitals: BP (!) 147/73   Pulse 78   Temp 98 F (36.7 C) (Oral)   Resp 16   Ht 5\' 1"  (1.549 m)   Wt 167 lb 3.2 oz (75.8 kg)   SpO2 98%   BMI 31.59 kg/m   BP Readings from Last 3 Encounters:  06/03/16 (!) 147/73  04/21/16 130/68  01/06/16 140/66    Physical Exam  Constitutional: She is oriented to person, place, and time. She appears  well-developed and well-nourished.  HENT:  TM's intact bilaterally, no effusions or erythema. Nasal turbinates boggy, nasal passages patent. Bilateral maxillary sinus tenderness. Oropharynx without exudates or erythema, mucous membranes moist, dentition in good repair.  Eyes: Right eye exhibits no discharge. Left eye exhibits no discharge.  Neck: Normal range of motion. Neck supple.  Cardiovascular: Normal rate, regular rhythm and intact distal pulses.  Exam reveals no gallop and no friction rub.   No murmur heard. Pulmonary/Chest: No respiratory distress. She has no wheezes. She has no rales.  Lymphadenopathy:    She has cervical adenopathy (mild, bilateral).  Neurological: She is alert and oriented to person, place, and time.  Skin: Skin is warm and dry. Capillary refill takes less than 2 seconds.  Psychiatric: She has a normal mood and affect.    Results for orders placed or performed in visit on 06/03/16 (from the past 24 hour(s))  POCT CBC     Status: Abnormal   Collection Time: 06/03/16  3:11 PM  Result Value Ref Range   WBC 5.4 4.6 - 10.2 K/uL   Lymph, poc 2.2 0.6 - 3.4   POC LYMPH PERCENT 40.5 10 - 50 %L   MID (cbc) 0.7 0 - 0.9   POC MID % 12.1 (A) 0 - 12 %M   POC Granulocyte 2.6 2 - 6.9   Granulocyte percent 47.4 37 - 80 %G   RBC 4.44 4.04 - 5.48 M/uL   Hemoglobin 13.4 12.2 - 16.2 g/dL   HCT, POC 38.4 37.7 - 47.9 %   MCV 86.5 80 - 97 fL   MCH, POC 30.3 27 - 31.2 pg   MCHC 35.0 31.8 - 35.4 g/dL   RDW, POC 13.4 %   Platelet Count, POC 208 142 -  424 K/uL   MPV 7.6 0 - 99.8 fL   Assessment and Plan :   1. Sinus pain 2. Sinus congestion 3. Cough - Start Zyrtec, cough suppression. Recommended supportive care. Start amoxicillin if no improvement. Consider   4. Environmental allergies - Start Flonase after symptoms of nasal congestion and sinus pain resolve. - cetirizine (ZYRTEC) 10 MG tablet; Take 1 tablet (10 mg total) by mouth daily.  Dispense: 30 tablet; Refill:  11 - fluticasone (FLONASE) 50 MCG/ACT nasal spray; Place 2 sprays into both nostrils daily.  Dispense: 16 g; Refill: 11  5. Essential hypertension 6. Elevated blood pressure reading - Monitor, consider dose increase of HCTZ if it remains elevated.   7. Need for shingles vaccine - Zoster Vac Recomb Adjuvanted [facility]) injection; Inject 0.5 mLs into the muscle once.  Dispense: 0.5 mL; Refill: 0   Jaynee Eagles, PA-C Primary Care at [facility] [phone] 06/03/2016  2:55 PM

## 2016-06-03 NOTE — Patient Instructions (Addendum)
- Once your symptoms are under control, you can start Flonase nasal spray for at least for the duration of your allergy season.  - For appropriate administration of the nasal spray, clear the nose, use opposite hand for opposite nare, sniff gently, exhale through your mouth. - Continue Allegra, Claritin or Zyrtec each day, as needed. - Drink at least 64 ounces of water each day. - Remove as many irritants/allergies as you are able to, no pets in the bedroom, change air filters in air vents.    Allergies, Adult An allergy is when your body's defense system (immune system) overreacts to an otherwise harmless substance (allergen) that you breathe in or eat or something that touches your skin. When you come into contact with something that you are allergic to, your immune system produces certain proteins (antibodies). These proteins cause cells to release chemicals (histamines) that trigger the symptoms of an allergic reaction. Allergies often affect the nasal passages (allergic rhinitis), eyes (allergic conjunctivitis), skin (atopic dermatitis), and stomach. Allergies can be mild or severe. Allergies cannot spread from person to person (are not contagious). They can develop at any age and may be outgrown. What are the causes? Allergies can be caused by any substance that your immune system mistakenly targets as harmful. These may include:  Outdoor allergens, such as pollen, grass, weeds, car exhaust, and mold spores.  Indoor allergens, such as dust, smoke, mold, and pet dander.  Foods, especially peanuts, milk, eggs, fish, shellfish, soy, nuts, and wheat.  Medicines, such as penicillin.  Skin irritants, such as detergents, chemicals, and latex.  Perfume.  Insect bites or stings. What increases the risk? You may be at greater risk of allergies if other people in your family have allergies. What are the signs or symptoms? Symptoms depend on what type of allergy you have. They may  include:  Runny, stuffy nose.  Sneezing.  Itchy mouth, ears, or throat.  Postnasal drip.  Sore throat.  Itchy, red, watery, or puffy eyes.  Skin rash or hives.  Stomach pain.  Vomiting.  Diarrhea.  Bloating.  Wheezing or coughing. People with a severe allergy to food, medicine, or an insect bite may have a life-threatening allergic reaction (anaphylaxis). Symptoms of anaphylaxis include:  Hives.  Itching.  Flushed face.  Swollen lips, tongue, or mouth.  Tight or swollen throat.  Chest pain or tightness in the chest.  Trouble breathing or shortness of breath.  Rapid heartbeat.  Dizziness or fainting.  Vomiting.  Diarrhea.  Pain in the abdomen. How is this diagnosed? This condition is diagnosed based on:  Your symptoms.  Your family and medical history.  A physical exam. You may need to see a health care provider who specializes in treating allergies (allergist). You may also have tests, including:  Skin tests to see which allergens are causing your symptoms, such as:  Skin prick test. In this test, your skin is pricked with a tiny needle and exposed to small amounts of possible allergens to see if your skin reacts.  Intradermal skin test. In this test, a small amount of allergen is injected under your skin to see if your skin reacts.  Patch test. In this test, a small amount of allergen is placed on your skin and then your skin is covered with a bandage. Your health care provider will check your skin after a couple of days to see if a rash has developed.  Blood tests.  Challenges tests. In this test, you inhale a small amount  of allergen by mouth to see if you have an allergic reaction. You may also be asked to:  Keep a food diary. A food diary is a record of all the foods and drinks you have in a day and any symptoms you experience.  Practice an elimination diet. An elimination diet involves eliminating specific foods from your diet and then  adding them back in one by one to find out if a certain food causes an allergic reaction. How is this treated? Treatment for allergies depends on your symptoms. Treatment may include:  Cold compresses to soothe itching and swelling.  Eye drops.  Nasal sprays.  Using a saline spray or container (neti pot) to flush out the nose (nasal irrigation). These methods can help clear away mucus and keep the nasal passages moist.  Using a humidifier.  Oral antihistamines or other medicines to block allergic reaction and inflammation.  Skin creams to treat rashes or itching.  Diet changes to eliminate food allergy triggers.  Repeated exposure to tiny amounts of allergens to build up a tolerance and prevent future allergic reactions (immunotherapy). These include:  Allergy shots.  Oral treatment. This involves taking small doses of an allergen under the tongue (sublingual immunotherapy).  Emergency epinephrine injection (auto-injector) in case of an allergic emergency. This is a self-injectable, pre-measured medicine that must be given within the first few minutes of a serious allergic reaction. Follow these instructions at home:  Avoid known allergens whenever possible.  If you suffer from airborne allergens, wash out your nose daily. You can do this with a saline spray or a neti pot to flush out your nose (nasal irrigation).  Take over-the-counter and prescription medicines only as told by your health care provider.  Keep all follow-up visits as told by your health care provider. This is important.  If you are at risk of a severe allergic reaction (anaphylaxis), keep your auto-injector with you at all times.  If you have ever had anaphylaxis, wear a medical alert bracelet or necklace that states you have a severe allergy. Contact a health care provider if:  Your symptoms do not improve with treatment. Get help right away if:  You have symptoms of anaphylaxis, such as:  Swollen  mouth, tongue, or throat.  Pain or tightness in your chest.  Trouble breathing or shortness of breath.  Dizziness or fainting.  Severe abdominal pain, vomiting, or diarrhea. This information is not intended to replace advice given to you by your health care provider. Make sure you discuss any questions you have with your health care provider. Document Released: 05/04/2002 Document Revised: 10/09/2015 Document Reviewed: 08/27/2015 Elsevier Interactive Patient Education  2017 [employer].     IF you received an x-ray today, you will receive an invoice from [facility]. Please contact [facility] at [phone] with questions or concerns regarding your invoice.   IF you received labwork today, you will receive an invoice from [facility]. Please contact [facility] at [phone] with questions or concerns regarding your invoice.   Our billing staff will not be able to assist you with questions regarding bills from these companies.  You will be contacted with the lab results as soon as they are available. The fastest way to get your results is to activate your My Chart account. Instructions are located on the last page of this paperwork. If you have not heard from [place] regarding the results in 2 weeks, please contact this office.

## 2016-07-22 ENCOUNTER — Telehealth: Payer: Self-pay | Admitting: Family Medicine

## 2016-07-22 NOTE — Telephone Encounter (Signed)
Pt would like a refill on her HYDROcodone-acetaminophen (NORCO) 10-325 MG tablet [[id number]] for one month. She is in process of getting her insurance straight. She doesn't have a phone at the moment.

## 2016-07-23 NOTE — Telephone Encounter (Signed)
Tried to call pt. Received messages stating that she was unable to receive calls at this time. Unable to leave message. Pt needs appt for refill. In OV of February states pt should come back around 5/28 for physical examination.

## 2016-07-26 ENCOUNTER — Ambulatory Visit (INDEPENDENT_AMBULATORY_CARE_PROVIDER_SITE_OTHER): Payer: BLUE CROSS/BLUE SHIELD | Admitting: Physician Assistant

## 2016-07-26 ENCOUNTER — Encounter: Payer: Self-pay | Admitting: Physician Assistant

## 2016-07-26 VITALS — BP 176/68 | HR 82 | Temp 98.0°F | Resp 18 | Ht 61.42 in | Wt 165.0 lb

## 2016-07-26 DIAGNOSIS — M25561 Pain in right knee: Secondary | ICD-10-CM

## 2016-07-26 DIAGNOSIS — G8929 Other chronic pain: Secondary | ICD-10-CM

## 2016-07-26 MED ORDER — HYDROCODONE-ACETAMINOPHEN 10-325 MG PO TABS: 1.0000 | ORAL_TABLET | Freq: Four times a day (QID) | ORAL | 0 refills | Status: DC | PRN

## 2016-07-26 NOTE — Progress Notes (Signed)
PRIMARY CARE AT [place], [place] [address] [phone]  Date:  07/26/2016   Name:  Madison Collins   DOB:  26-Aug-1950   MRN:  [id number]  PCP:  Wardell Honour, MD    History of Present Illness:  Madison Collins is a 66 y.o. female patient who presents to PCP with  Chief Complaint  Patient presents with  . Medication Refill    Hydrocodone      Patient is here for follow up of hydrocodone refill.  She reports that utilizes this for chronic knee and elbow pain.  She generally is followed by her pcp, Dr. Reginia Forts.  Hydrocodone was filled along with UDS, and a Occupational psychologist was signed by patient.   She states that she has been out of money.    Patient Active Problem List   Diagnosis Date Noted  . Arthritis of knee, right 12/23/2013  . BMI 29.0-29.9,adult 11/18/2011  . Hepatitis C virus infection without hepatic coma 05/19/2011  . Hypertension 05/19/2011  . History of migraine headaches 05/19/2011  . Insomnia 05/19/2011  . Chronic knee pain 05/19/2011  . Depression with anxiety 05/19/2011  . Cervical disc syndrome 05/19/2011  . Gastroesophageal reflux 05/19/2011    Past Medical History:  Diagnosis Date  . Anxiety   . Anxiety and depression   . Arthritis   . Bulging disc 1990's   c4-5  . Cataract    right eye  . Depression   . GERD (gastroesophageal reflux disease)   . H/O: osteoarthritis   . Headache    migraines occasionally  . Hepatitis C    IA viral load low 02/09    BX past mild fibrosis  . Hypertension   . Insomnia, unspecified   . Knee pain   . Post-menopause 2010    Past Surgical History:  Procedure Laterality Date  . BUNIONECTOMY Right 1983  . COLONOSCOPY  10/22/13  . EYE SURGERY Right 2010   hole in macula  . KNEE SURGERY Right     x4  . ORIF ELBOW FRACTURE Right 04/08/2015   Procedure: OPEN REDUCTION INTERNAL FIXATION (ORIF) ELBOW/OLECRANON FRACTURE;  Surgeon: Milly Jakob, MD;  Location: [facility];  Service:  Orthopedics;  Laterality: Right;  . ORIF RADIAL FRACTURE Right 04/08/2015   Procedure: OPEN REDUCTION INTERNAL FIXATION (ORIF) RADIAL HEAD ;  Surgeon: Milly Jakob, MD;  Location: [facility];  Service: Orthopedics;  Laterality: Right;  . OVARIAN CYST REMOVAL    . TOTAL KNEE ARTHROPLASTY Right 12/24/2013   Procedure: RIGHT TOTAL KNEE ARTHROPLASTY;  Surgeon: Kerin Salen, MD;  Location: [facility];  Service: Orthopedics;  Laterality: Right;  . TUBAL LIGATION  1987    Social History  Substance Use Topics  . Smoking status: Never Smoker  . Smokeless tobacco: Never Used  . Alcohol use Yes     Comment: a glass of wine/rarely    Family History  Problem Relation Age of Onset  . Cancer Mother        Lung  . Diabetes Mother   . Stroke Father   . Diabetes Father   . Colon cancer Father 66       anal cancer  . Hypertension Brother   . Bipolar disorder Daughter   . Bipolar disorder Son   . Diabetes Maternal Grandfather   . Heart disease Maternal Grandfather   . Emphysema Paternal Grandmother   . Cancer Paternal Grandfather        stomach  Allergies  Allergen Reactions  . Latex Other (See Comments)    "Latex tape used during surgery - breaks my skin out"  . Adhesive [Tape] Rash    "per pt - surgical tape, also causes itching    Medication list has been reviewed and updated.  Current Outpatient Prescriptions on File Prior to Visit  Medication Sig Dispense Refill  . citalopram (CELEXA) 40 MG tablet Take 1 tablet (40 mg total) by mouth every morning. 90 tablet 3  . fluocinonide-emollient (LIDEX-E) 0.05 % cream APPLY TWICE DAILY TO FOOT DERMATITIS 30 g 1  . fluticasone (FLONASE) 50 MCG/ACT nasal spray Place 2 sprays into both nostrils daily. 16 g 11  . gabapentin (NEURONTIN) 300 MG capsule Take 1 capsule (300 mg total) by mouth 3 (three) times daily. 90 capsule 5  . hydrochlorothiazide (MICROZIDE) 12.5 MG capsule Take 1 capsule (12.5 mg total) by mouth every morning. 90 capsule 3  .  lovastatin (MEVACOR) 20 MG tablet Take 1 tablet (20 mg total) by mouth at bedtime. 90 tablet 1  . meloxicam (MOBIC) 15 MG tablet Take 1 tablet (15 mg total) by mouth daily. 90 tablet 1  . omeprazole (PRILOSEC) 20 MG capsule Take 1 capsule (20 mg total) by mouth every morning. 90 capsule 3  . SUMAtriptan (IMITREX) 100 MG tablet Take 1 tablet (100 mg total) by mouth every 2 (two) hours as needed for migraine or headache. May repeat in 2 hours if headache persists or recurs. 10 tablet 11   No current facility-administered medications on file prior to visit.     ROS ROS otherwise unremarkable unless listed above.  Physical Examination: BP (!) 176/68   Pulse 82   Temp 98 F (36.7 C) (Oral)   Resp 18   Ht 5' 1.42" (1.56 m)   Wt 165 lb (74.8 kg)   SpO2 98%   BMI 30.75 kg/m  Ideal Body Weight: Weight in (lb) to have BMI = 25: 133.8  Physical Exam  Constitutional: She is oriented to person, place, and time. She appears well-developed and well-nourished. No distress.  HENT:  Head: Normocephalic and atraumatic.  Right Ear: External ear normal.  Left Ear: External ear normal.  Eyes: Conjunctivae and EOM are normal. Pupils are equal, round, and reactive to light.  Cardiovascular: Normal rate and regular rhythm.  Exam reveals no friction rub.   No murmur heard. Pulmonary/Chest: Effort normal. No respiratory distress. She has no wheezes.  Neurological: She is alert and oriented to person, place, and time.  Skin: She is not diaphoretic.  Psychiatric: She has a normal mood and affect. Her behavior is normal.     Assessment and Plan: Madison Collins is a 66 y.o. female who is here today for cc of medication refill. Advised contact, multiple providers.  Will fill for 7 days.  Advised to return for refill with Tamala Julian. Chronic pain of right knee  Ivar Drape, PA-C [facility] 6/6/20185:40 PM

## 2016-07-26 NOTE — Patient Instructions (Signed)
     IF you received an x-ray today, you will receive an invoice from [facility]. Please contact [facility] at [phone] with questions or concerns regarding your invoice.   IF you received labwork today, you will receive an invoice from [facility]. Please contact [facility] at [phone] with questions or concerns regarding your invoice.   Our billing staff will not be able to assist you with questions regarding bills from these companies.  You will be contacted with the lab results as soon as they are available. The fastest way to get your results is to activate your My Chart account. Instructions are located on the last page of this paperwork. If you have not heard from us regarding the results in 2 weeks, please contact this office.     

## 2016-07-30 ENCOUNTER — Encounter: Payer: Self-pay | Admitting: Family Medicine

## 2016-07-30 ENCOUNTER — Ambulatory Visit (INDEPENDENT_AMBULATORY_CARE_PROVIDER_SITE_OTHER): Payer: BLUE CROSS/BLUE SHIELD | Admitting: Family Medicine

## 2016-07-30 VITALS — BP 153/72 | HR 68 | Temp 97.6°F | Resp 16 | Wt 165.4 lb

## 2016-07-30 DIAGNOSIS — I10 Essential (primary) hypertension: Secondary | ICD-10-CM

## 2016-07-30 DIAGNOSIS — Z6830 Body mass index (BMI) 30.0-30.9, adult: Secondary | ICD-10-CM | POA: Diagnosis not present

## 2016-07-30 DIAGNOSIS — G894 Chronic pain syndrome: Secondary | ICD-10-CM

## 2016-07-30 DIAGNOSIS — F5104 Psychophysiologic insomnia: Secondary | ICD-10-CM | POA: Diagnosis not present

## 2016-07-30 DIAGNOSIS — K219 Gastro-esophageal reflux disease without esophagitis: Secondary | ICD-10-CM | POA: Diagnosis not present

## 2016-07-30 DIAGNOSIS — M5010 Cervical disc disorder with radiculopathy, unspecified cervical region: Secondary | ICD-10-CM

## 2016-07-30 DIAGNOSIS — E7439 Other disorders of intestinal carbohydrate absorption: Secondary | ICD-10-CM | POA: Diagnosis not present

## 2016-07-30 DIAGNOSIS — E6609 Other obesity due to excess calories: Secondary | ICD-10-CM | POA: Diagnosis not present

## 2016-07-30 MED ORDER — HYDROCODONE-ACETAMINOPHEN 10-325 MG PO TABS: 1.0000 | ORAL_TABLET | Freq: Four times a day (QID) | ORAL | 0 refills | Status: DC | PRN

## 2016-07-30 MED ORDER — LOSARTAN POTASSIUM-HCTZ 50-12.5 MG PO TABS: 1.0000 | ORAL_TABLET | Freq: Every day | ORAL | 1 refills | Status: DC

## 2016-07-30 NOTE — Progress Notes (Signed)
Subjective:    Patient ID: Madison Collins, female    DOB: 1950-05-20, 66 y.o.   MRN: [id number]  07/30/2016  med refills; Depression; Hypertension; Hyperlipidemia; and Knee Pain   HPI This 66 y.o. female presents for four month follow-up of hypertension, hypercholesterolemia, chronic pain syndrome, glucose intolerance, anxiety.  Admits to major stress.  Taking care of grandchildren; cleaning house and daughter's house.  Got kicked out of [employer], community. Patient reports good compliance with medication, good tolerance to medication, and good symptom control.  Frustrated by lack of weight loss.  Stays very active with small grandchildren, yet not monitoring diet closely.  Pain well controlled with current opiate rx.    BP Readings from Last 3 Encounters:  07/30/16 (!) 153/72  07/26/16 (!) 176/68  06/03/16 140/62   Wt Readings from Last 3 Encounters:  07/30/16 165 lb 6.4 oz (75 kg)  07/26/16 165 lb (74.8 kg)  06/03/16 167 lb 3.2 oz (75.8 kg)   Immunization History  Administered Date(s) Administered  . Hepatitis B, adult 06/04/2013, 07/03/2013  . Influenza Split 11/25/2010, 11/17/2011  . Influenza,inj,Quad PF,36+ Mos 11/08/2012, 04/02/2015, 04/21/2016  . Influenza-Unspecified 11/05/2013  . Pneumococcal Conjugate-13 10/24/2006  . Pneumococcal Polysaccharide-23 12/26/2013  . Td 02/23/2007  . Zoster 09/22/2013  . Zoster Recombinat (Shingrix) 06/22/2016    Review of Systems  Constitutional: Negative for chills, diaphoresis, fatigue and fever.  Eyes: Negative for visual disturbance.  Respiratory: Negative for cough and shortness of breath.   Cardiovascular: Negative for chest pain, palpitations and leg swelling.  Gastrointestinal: Negative for abdominal pain, constipation, diarrhea, nausea and vomiting.  Endocrine: Negative for cold intolerance, heat intolerance, polydipsia, polyphagia and polyuria.  Musculoskeletal: Positive for arthralgias and gait problem. Negative for joint  swelling.  Neurological: Negative for dizziness, tremors, seizures, syncope, facial asymmetry, speech difficulty, weakness, light-headedness, numbness and headaches.  Psychiatric/Behavioral: Positive for dysphoric mood. Negative for self-injury and sleep disturbance. The patient is nervous/anxious.     Past Medical History:  Diagnosis Date  . Anxiety   . Anxiety and depression   . Arthritis   . Bulging disc 1990's   c4-5  . Cataract    right eye  . Depression   . GERD (gastroesophageal reflux disease)   . H/O: osteoarthritis   . Headache    migraines occasionally  . Hepatitis C    IA viral load low 02/09    BX past mild fibrosis  . Hypertension   . Insomnia, unspecified   . Knee pain   . Post-menopause 2010   Past Surgical History:  Procedure Laterality Date  . BUNIONECTOMY Right 1983  . COLONOSCOPY  10/22/13  . EYE SURGERY Right 2010   hole in macula  . KNEE SURGERY Right     x4  . ORIF ELBOW FRACTURE Right 04/08/2015   Procedure: OPEN REDUCTION INTERNAL FIXATION (ORIF) ELBOW/OLECRANON FRACTURE;  Surgeon: Milly Jakob, MD;  Location: [facility];  Service: Orthopedics;  Laterality: Right;  . ORIF RADIAL FRACTURE Right 04/08/2015   Procedure: OPEN REDUCTION INTERNAL FIXATION (ORIF) RADIAL HEAD ;  Surgeon: Milly Jakob, MD;  Location: [facility];  Service: Orthopedics;  Laterality: Right;  . OVARIAN CYST REMOVAL    . TOTAL KNEE ARTHROPLASTY Right 12/24/2013   Procedure: RIGHT TOTAL KNEE ARTHROPLASTY;  Surgeon: Kerin Salen, MD;  Location: [facility];  Service: Orthopedics;  Laterality: Right;  . TUBAL LIGATION  1987   Allergies  Allergen Reactions  . Latex Other (See Comments)    "Latex tape  used during surgery - breaks my skin out"  . Adhesive [Tape] Rash    "per pt - surgical tape, also causes itching    Social History   Social History  . Marital status: Widowed    Spouse name: N/A  . Number of children: N/A  . Years of education: N/A   Occupational History  . Restaurant  Mgr    Social History Main Topics  . Smoking status: Never Smoker  . Smokeless tobacco: Never Used  . Alcohol use Yes     Comment: a glass of wine/rarely  . Drug use: No  . Sexual activity: Yes   Other Topics Concern  . Not on file   Social History Narrative   Significant other. Education: [employer]. Exercise: Walks three times a week.   Family History  Problem Relation Age of Onset  . Cancer Mother        Lung  . Diabetes Mother   . Stroke Father   . Diabetes Father   . Colon cancer Father 49       anal cancer  . Hypertension Brother   . Bipolar disorder Daughter   . Bipolar disorder Son   . Diabetes Maternal Grandfather   . Heart disease Maternal Grandfather   . Emphysema Paternal Grandmother   . Cancer Paternal Grandfather        stomach       Objective:    BP (!) 153/72 (Patient Position: Sitting, Cuff Size: Normal)   Pulse 68   Temp 97.6 F (36.4 C) (Oral)   Resp 16   Wt 165 lb 6.4 oz (75 kg)   SpO2 99%   BMI 30.83 kg/m  Physical Exam  Constitutional: She is oriented to person, place, and time. She appears well-developed and well-nourished. No distress.  HENT:  Head: Normocephalic and atraumatic.  Right Ear: External ear normal.  Left Ear: External ear normal.  Nose: Nose normal.  Mouth/Throat: Oropharynx is clear and moist.  Eyes: Conjunctivae and EOM are normal. Pupils are equal, round, and reactive to light.  Neck: Normal range of motion. Neck supple. Carotid bruit is not present. No thyromegaly present.  Cardiovascular: Normal rate, regular rhythm, normal heart sounds and intact distal pulses.  Exam reveals no gallop and no friction rub.   No murmur heard. Pulmonary/Chest: Effort normal and breath sounds normal. She has no wheezes. She has no rales.  Abdominal: Soft. Bowel sounds are normal. She exhibits no distension and no mass. There is no tenderness. There is no rebound and no guarding.  Lymphadenopathy:    She has no cervical adenopathy.    Neurological: She is alert and oriented to person, place, and time. No cranial nerve deficit.  Skin: Skin is warm and dry. No rash noted. She is not diaphoretic. No erythema. No pallor.  Psychiatric: She has a normal mood and affect. Her behavior is normal.   Depression screen [facility] 2/9 07/30/2016 07/26/2016 06/03/2016 04/21/2016 01/06/2016  Decreased Interest 0 0 0 0 0  Down, Depressed, Hopeless 0 0 0 0 0  PHQ - 2 Score 0 0 0 0 0  Altered sleeping - - - - -  Tired, decreased energy - - - - -  Change in appetite - - - - -  Feeling bad or failure about yourself  - - - - -  Trouble concentrating - - - - -  Moving slowly or fidgety/restless - - - - -  Suicidal thoughts - - - - -  PHQ-9  Score - - - - -        Assessment & Plan:   1. Essential hypertension   2. Gastroesophageal reflux disease without esophagitis   3. Cervical disc syndrome   4. Psychophysiological insomnia   5. Class 1 obesity due to excess calories without serious comorbidity with body mass index (BMI) of 30.0 to 30.9 in adult   6. Chronic pain syndrome   7. Glucose intolerance    -hypertension uncontrolled; obtain labs; change to losartan/hctz. -refills of hydrocodone provided; obtain UDS today per pain contract protocol. -encourage weight loss, exercise, and 1200 kcal restriction daily.   Orders Placed This Encounter  Procedures  . CBC with Differential/Platelet  . Comprehensive metabolic panel  . Hemoglobin A1c  . Pain Management Screening Profile (10S)   Meds ordered this encounter  Medications  . DISCONTD: HYDROcodone-acetaminophen (NORCO) 10-325 MG tablet    Sig: Take 1 tablet by mouth every 6 (six) hours as needed.    Dispense:  124 tablet    Refill:  0    DO NOT FILL FOR 60 DAYS AFTER PRESCRIBED.  Marland Kitchen DISCONTD: HYDROcodone-acetaminophen (NORCO) 10-325 MG tablet    Sig: Take 1 tablet by mouth every 6 (six) hours as needed.    Dispense:  124 tablet    Refill:  0    DO NOT FILL FOR 30 DAYS AFTER  PRESCRIBED.  Marland Kitchen HYDROcodone-acetaminophen (NORCO) 10-325 MG tablet    Sig: Take 1 tablet by mouth every 6 (six) hours as needed.    Dispense:  124 tablet    Refill:  0  . losartan-hydrochlorothiazide (HYZAAR) 50-12.5 MG tablet    Sig: Take 1 tablet by mouth daily.    Dispense:  90 tablet    Refill:  1    Return in about 3 months (around 10/30/2016) for  chronic pain, high blood pressure.   Gilman Olazabal Elayne Guerin, M.D. Primary Care at [facility] previously [facility] [address] [place], [place]  [address] [phone] phone [phone] fax

## 2016-07-30 NOTE — Patient Instructions (Addendum)
https://www.matthews.info/ Goal= 60 grams of protein per day; 60 ounces of water per day   Calorie Counting for Weight Loss Calories are units of energy. Your body needs a certain amount of calories from food to keep you going throughout the day. When you eat more calories than your body needs, your body stores the extra calories as fat. When you eat fewer calories than your body needs, your body burns fat to get the energy it needs. Calorie counting means keeping track of how many calories you eat and drink each day. Calorie counting can be helpful if you need to lose weight. If you make sure to eat fewer calories than your body needs, you should lose weight. Ask your health care provider what a healthy weight is for you. For calorie counting to work, you will need to eat the right number of calories in a day in order to lose a healthy amount of weight per week. A dietitian can help you determine how many calories you need in a day and will give you suggestions on how to reach your calorie goal.  A healthy amount of weight to lose per week is usually 1-2 lb (0.5-0.9 kg). This usually means that your daily calorie intake should be reduced by 500-750 calories.  Eating 1,200 - 1,500 calories per day can help most women lose weight.  Eating 1,500 - 1,800 calories per day can help most men lose weight.  What is my plan? My goal is to have __________ calories per day. If I have this many calories per day, I should lose around __________ pounds per week. What do I need to know about calorie counting? In order to meet your daily calorie goal, you will need to:  Find out how many calories are in each food you would like to eat. Try to do this before you eat.  Decide how much of the food you plan to eat.  Write down what you ate and how many calories it had. Doing this is called keeping a food log.  To successfully lose weight, it is important to balance calorie counting with a healthy lifestyle that  includes regular activity. Aim for 150 minutes of moderate exercise (such as walking) or 75 minutes of vigorous exercise (such as running) each week. Where do I find calorie information?  The number of calories in a food can be found on a Nutrition Facts label. If a food does not have a Nutrition Facts label, try to look up the calories online or ask your dietitian for help. Remember that calories are listed per serving. If you choose to have more than one serving of a food, you will have to multiply the calories per serving by the amount of servings you plan to eat. For example, the label on a package of bread might say that a serving size is 1 slice and that there are 90 calories in a serving. If you eat 1 slice, you will have eaten 90 calories. If you eat 2 slices, you will have eaten 180 calories. How do I keep a food log? Immediately after each meal, record the following information in your food log:  What you ate. Don't forget to include toppings, sauces, and other extras on the food.  How much you ate. This can be measured in cups, ounces, or number of items.  How many calories each food and drink had.  The total number of calories in the meal.  Keep your food log near you,  such as in a small notebook in your pocket, or use a mobile app or website. Some programs will calculate calories for you and show you how many calories you have left for the day to meet your goal. What are some calorie counting tips?  Use your calories on foods and drinks that will fill you up and not leave you hungry: ? Some examples of foods that fill you up are nuts and nut butters, vegetables, lean proteins, and high-fiber foods like whole grains. High-fiber foods are foods with more than 5 g fiber per serving. ? Drinks such as sodas, specialty coffee drinks, alcohol, and juices have a lot of calories, yet do not fill you up.  Eat nutritious foods and avoid empty calories. Empty calories are calories you get  from foods or beverages that do not have many vitamins or protein, such as candy, sweets, and soda. It is better to have a nutritious high-calorie food (such as an avocado) than a food with few nutrients (such as a bag of chips).  Know how many calories are in the foods you eat most often. This will help you calculate calorie counts faster.  Pay attention to calories in drinks. Low-calorie drinks include water and unsweetened drinks.  Pay attention to nutrition labels for "low fat" or "fat free" foods. These foods sometimes have the same amount of calories or more calories than the full fat versions. They also often have added sugar, starch, or salt, to make up for flavor that was removed with the fat.  Find a way of tracking calories that works for you. Get creative. Try different apps or programs if writing down calories does not work for you. What are some portion control tips?  Know how many calories are in a serving. This will help you know how many servings of a certain food you can have.  Use a measuring cup to measure serving sizes. You could also try weighing out portions on a kitchen scale. With time, you will be able to estimate serving sizes for some foods.  Take some time to put servings of different foods on your favorite plates, bowls, and cups so you know what a serving looks like.  Try not to eat straight from a bag or box. Doing this can lead to overeating. Put the amount you would like to eat in a cup or on a plate to make sure you are eating the right portion.  Use smaller plates, glasses, and bowls to prevent overeating.  Try not to multitask (for example, watch TV or use your computer) while eating. If it is time to eat, sit down at a table and enjoy your food. This will help you to know when you are full. It will also help you to be aware of what you are eating and how much you are eating. What are tips for following this plan? Reading food labels  Check the calorie  count compared to the serving size. The serving size may be smaller than what you are used to eating.  Check the source of the calories. Make sure the food you are eating is high in vitamins and protein and low in saturated and trans fats. Shopping  Read nutrition labels while you shop. This will help you make healthy decisions before you decide to purchase your food.  Make a grocery list and stick to it. Cooking  Try to cook your favorite foods in a healthier way. For example, try baking instead of frying.  Use low-fat dairy products. Meal planning  Use more fruits and vegetables. Half of your plate should be fruits and vegetables.  Include lean proteins like poultry and fish. How do I count calories when eating out?  Ask for smaller portion sizes.  Consider sharing an entree and sides instead of getting your own entree.  If you get your own entree, eat only half. Ask for a box at the beginning of your meal and put the rest of your entree in it so you are not tempted to eat it.  If calories are listed on the menu, choose the lower calorie options.  Choose dishes that include vegetables, fruits, whole grains, low-fat dairy products, and lean protein.  Choose items that are boiled, broiled, grilled, or steamed. Stay away from items that are buttered, battered, fried, or served with cream sauce. Items labeled "crispy" are usually fried, unless stated otherwise.  Choose water, low-fat milk, unsweetened iced tea, or other drinks without added sugar. If you want an alcoholic beverage, choose a lower calorie option such as a glass of wine or light beer.  Ask for dressings, sauces, and syrups on the side. These are usually high in calories, so you should limit the amount you eat.  If you want a salad, choose a garden salad and ask for grilled meats. Avoid extra toppings like bacon, cheese, or fried items. Ask for the dressing on the side, or ask for olive oil and vinegar or lemon to use  as dressing.  Estimate how many servings of a food you are given. For example, a serving of cooked rice is  cup or about the size of half a baseball. Knowing serving sizes will help you be aware of how much food you are eating at restaurants. The list below tells you how big or small some common portion sizes are based on everyday objects: ? 1 oz-4 stacked dice. ? 3 oz-1 deck of cards. ? 1 tsp-1 die. ? 1 Tbsp- a ping-pong ball. ? 2 Tbsp-1 ping-pong ball. ?  cup- baseball. ? 1 cup-1 baseball. Summary  Calorie counting means keeping track of how many calories you eat and drink each day. If you eat fewer calories than your body needs, you should lose weight.  A healthy amount of weight to lose per week is usually 1-2 lb (0.5-0.9 kg). This usually means reducing your daily calorie intake by 500-750 calories.  The number of calories in a food can be found on a Nutrition Facts label. If a food does not have a Nutrition Facts label, try to look up the calories online or ask your dietitian for help.  Use your calories on foods and drinks that will fill you up, and not on foods and drinks that will leave you hungry.  Use smaller plates, glasses, and bowls to prevent overeating. This information is not intended to replace advice given to you by your health care provider. Make sure you discuss any questions you have with your health care provider. Document Released: 02/08/2005 Document Revised: 01/09/2016 Document Reviewed: 01/09/2016 Elsevier Interactive Patient Education  2017 [employer].   IF you received an x-ray today, you will receive an invoice from [facility]. Please contact [facility] at [phone] with questions or concerns regarding your invoice.   IF you received labwork today, you will receive an invoice from [place]. Please contact [facility] at [phone] with questions or concerns regarding your invoice.   Our billing staff will not be able to assist  you with  questions regarding bills from these companies.  You will be contacted with the lab results as soon as they are available. The fastest way to get your results is to activate your My Chart account. Instructions are located on the last page of this paperwork. If you have not heard from [place] regarding the results in 2 weeks, please contact this office.

## 2016-07-31 LAB — CBC WITH DIFFERENTIAL/PLATELET
Basophils Absolute: 0 10*3/uL (ref 0.0–0.2)
Basos: 0 %
EOS (ABSOLUTE): 0.1 10*3/uL (ref 0.0–0.4)
EOS: 1 %
HEMATOCRIT: 40.1 % (ref 34.0–46.6)
Hemoglobin: 13.2 g/dL (ref 11.1–15.9)
IMMATURE GRANS (ABS): 0 10*3/uL (ref 0.0–0.1)
IMMATURE GRANULOCYTES: 0 %
LYMPHS: 42 %
Lymphocytes Absolute: 3.6 10*3/uL — ABNORMAL HIGH (ref 0.7–3.1)
MCH: 29.2 pg (ref 26.6–33.0)
MCHC: 32.9 g/dL (ref 31.5–35.7)
MCV: 89 fL (ref 79–97)
Monocytes Absolute: 0.7 10*3/uL (ref 0.1–0.9)
Monocytes: 8 %
NEUTROS PCT: 49 %
Neutrophils Absolute: 4.2 10*3/uL (ref 1.4–7.0)
PLATELETS: 249 10*3/uL (ref 150–379)
RBC: 4.52 x10E6/uL (ref 3.77–5.28)
RDW: 14.2 % (ref 12.3–15.4)
WBC: 8.6 10*3/uL (ref 3.4–10.8)

## 2016-07-31 LAB — PMP SCREEN PROFILE (10S), URINE
Amphetamine Scrn, Ur: NEGATIVE ng/mL
BARBITURATE SCREEN URINE: NEGATIVE ng/mL
BENZODIAZEPINE SCREEN, URINE: NEGATIVE ng/mL
CANNABINOIDS UR QL SCN: NEGATIVE ng/mL
CREATININE(CRT), U: 34.3 mg/dL (ref 20.0–300.0)
Cocaine (Metab) Scrn, Ur: NEGATIVE ng/mL
METHADONE SCREEN, URINE: NEGATIVE ng/mL
OXYCODONE+OXYMORPHONE UR QL SCN: NEGATIVE ng/mL
Opiate Scrn, Ur: POSITIVE ng/mL
PH UR, DRUG SCRN: 6.3 (ref 4.5–8.9)
Phencyclidine Qn, Ur: NEGATIVE ng/mL
Propoxyphene Scrn, Ur: NEGATIVE ng/mL

## 2016-07-31 LAB — COMPREHENSIVE METABOLIC PANEL
A/G RATIO: 1.7 (ref 1.2–2.2)
ALT: 17 IU/L (ref 0–32)
AST: 15 IU/L (ref 0–40)
Albumin: 4.7 g/dL (ref 3.6–4.8)
Alkaline Phosphatase: 78 IU/L (ref 39–117)
BUN/Creatinine Ratio: 18 (ref 12–28)
BUN: 13 mg/dL (ref 8–27)
Bilirubin Total: 0.4 mg/dL (ref 0.0–1.2)
CALCIUM: 9.4 mg/dL (ref 8.7–10.3)
CO2: 23 mmol/L (ref 18–29)
CREATININE: 0.74 mg/dL (ref 0.57–1.00)
Chloride: 102 mmol/L (ref 96–106)
GFR, EST AFRICAN AMERICAN: 100 mL/min/{1.73_m2} (ref >59)
GFR, EST NON AFRICAN AMERICAN: 86 mL/min/{1.73_m2} (ref >59)
GLOBULIN, TOTAL: 2.8 g/dL (ref 1.5–4.5)
Glucose: 76 mg/dL (ref 65–99)
POTASSIUM: 3.6 mmol/L (ref 3.5–5.2)
Sodium: 141 mmol/L (ref 134–144)
TOTAL PROTEIN: 7.5 g/dL (ref 6.0–8.5)

## 2016-07-31 LAB — HEMOGLOBIN A1C
Est. average glucose Bld gHb Est-mCnc: 137 mg/dL
Hgb A1c MFr Bld: 6.4 % — ABNORMAL HIGH (ref 4.8–5.6)

## 2016-08-05 DIAGNOSIS — E7439 Other disorders of intestinal carbohydrate absorption: Secondary | ICD-10-CM | POA: Insufficient documentation

## 2016-08-05 DIAGNOSIS — E6609 Other obesity due to excess calories: Secondary | ICD-10-CM | POA: Insufficient documentation

## 2016-08-05 DIAGNOSIS — Z6830 Body mass index (BMI) 30.0-30.9, adult: Secondary | ICD-10-CM

## 2016-08-05 DIAGNOSIS — G894 Chronic pain syndrome: Secondary | ICD-10-CM | POA: Insufficient documentation

## 2016-08-05 HISTORY — DX: Body mass index (BMI) 30.0-30.9, adult: Z68.30

## 2016-08-05 HISTORY — DX: Other obesity due to excess calories: E66.09

## 2016-10-30 ENCOUNTER — Other Ambulatory Visit: Payer: Self-pay | Admitting: Family Medicine

## 2016-11-02 ENCOUNTER — Ambulatory Visit (INDEPENDENT_AMBULATORY_CARE_PROVIDER_SITE_OTHER): Payer: Self-pay | Admitting: Family Medicine

## 2016-11-02 ENCOUNTER — Encounter: Payer: Self-pay | Admitting: Family Medicine

## 2016-11-02 VITALS — BP 124/72 | HR 67 | Temp 97.8°F | Resp 16 | Ht 62.21 in | Wt 162.0 lb

## 2016-11-02 DIAGNOSIS — M25561 Pain in right knee: Secondary | ICD-10-CM

## 2016-11-02 DIAGNOSIS — I10 Essential (primary) hypertension: Secondary | ICD-10-CM

## 2016-11-02 DIAGNOSIS — Z23 Encounter for immunization: Secondary | ICD-10-CM

## 2016-11-02 DIAGNOSIS — F418 Other specified anxiety disorders: Secondary | ICD-10-CM

## 2016-11-02 DIAGNOSIS — G894 Chronic pain syndrome: Secondary | ICD-10-CM

## 2016-11-02 DIAGNOSIS — E7439 Other disorders of intestinal carbohydrate absorption: Secondary | ICD-10-CM

## 2016-11-02 DIAGNOSIS — G8929 Other chronic pain: Secondary | ICD-10-CM

## 2016-11-02 DIAGNOSIS — E7800 Pure hypercholesterolemia, unspecified: Secondary | ICD-10-CM

## 2016-11-02 MED ORDER — MELOXICAM 15 MG PO TABS: 15.0000 mg | ORAL_TABLET | Freq: Every day | ORAL | 1 refills | Status: DC

## 2016-11-02 MED ORDER — HYDROCODONE-ACETAMINOPHEN 10-325 MG PO TABS: 1.0000 | ORAL_TABLET | Freq: Four times a day (QID) | ORAL | 0 refills | Status: DC | PRN

## 2016-11-02 NOTE — Progress Notes (Signed)
Subjective:    Patient ID: Madison Collins, female    DOB: April 15, 1950, 66 y.o.   MRN: [id number]  11/02/2016  Hypertension (3 month follow-up); Pain; and Medication Refill (All meds )   HPI This 66 y.o. female presents for evaluation of hypertension, chronic pain syndrome, glucose intolerance, anxiety/depression. Changed to Losartan/HCTZ at last visit.  UDS obtained. Hemoglobin A1c worsened to 6.4.  Has three grandchildren living with patient; daughter is looking for own place now; preteen will stay with patient.   Small cough with Losartan-HCTZ.  Not a daily event.  Coughing while laying down; PND.  Has a fan that blows as well.   Weight has been stable for a few years.  Recently it has been hot.  Rushing around trying to get dinner ready for the kids.   Taking hydrocodone qid scheduled for knees and elbows.  Would like to eventually wean; really needs pain medication in evenings.    BP Readings from Last 3 Encounters:  11/02/16 124/72  07/30/16 (!) 153/72  07/26/16 (!) 176/68   Wt Readings from Last 3 Encounters:  11/02/16 162 lb (73.5 kg)  07/30/16 165 lb 6.4 oz (75 kg)  07/26/16 165 lb (74.8 kg)   Immunization History  Administered Date(s) Administered  . Hepatitis B, adult 06/04/2013, 07/03/2013  . Influenza Split 11/25/2010, 11/17/2011  . Influenza,inj,Quad PF,6+ Mos 11/08/2012, 04/02/2015, 04/21/2016  . Influenza-Unspecified 11/05/2013  . Pneumococcal Conjugate-13 10/24/2006  . Pneumococcal Polysaccharide-23 12/26/2013  . Td 02/23/2007  . Zoster 09/22/2013  . Zoster Recombinat (Shingrix) 06/22/2016    Review of Systems  Constitutional: Negative for chills, diaphoresis, fatigue and fever.  Eyes: Negative for visual disturbance.  Respiratory: Negative for cough and shortness of breath.   Cardiovascular: Negative for chest pain, palpitations and leg swelling.  Gastrointestinal: Negative for abdominal pain, constipation, diarrhea, nausea and vomiting.  Endocrine:  Negative for cold intolerance, heat intolerance, polydipsia, polyphagia and polyuria.  Musculoskeletal: Positive for arthralgias, neck pain and neck stiffness.  Neurological: Negative for dizziness, tremors, seizures, syncope, facial asymmetry, speech difficulty, weakness, light-headedness, numbness and headaches.  Psychiatric/Behavioral: Positive for dysphoric mood. Negative for sleep disturbance. The patient is nervous/anxious.     Past Medical History:  Diagnosis Date  . Anxiety   . Anxiety and depression   . Arthritis   . Bulging disc 1990's   c4-5  . Cataract    right eye  . Depression   . GERD (gastroesophageal reflux disease)   . H/O: osteoarthritis   . Headache    migraines occasionally  . Hepatitis C    IA viral load low 02/09    BX past mild fibrosis  . Hypertension   . Insomnia, unspecified   . Knee pain   . Post-menopause 2010   Past Surgical History:  Procedure Laterality Date  . BUNIONECTOMY Right 1983  . COLONOSCOPY  10/22/13  . EYE SURGERY Right 2010   hole in macula  . KNEE SURGERY Right     x4  . ORIF ELBOW FRACTURE Right 04/08/2015   Procedure: OPEN REDUCTION INTERNAL FIXATION (ORIF) ELBOW/OLECRANON FRACTURE;  Surgeon: Milly Jakob, MD;  Location: [place];  Service: Orthopedics;  Laterality: Right;  . ORIF RADIAL FRACTURE Right 04/08/2015   Procedure: OPEN REDUCTION INTERNAL FIXATION (ORIF) RADIAL HEAD ;  Surgeon: Milly Jakob, MD;  Location: [place];  Service: Orthopedics;  Laterality: Right;  . OVARIAN CYST REMOVAL    . TOTAL KNEE ARTHROPLASTY Right 12/24/2013   Procedure: RIGHT TOTAL KNEE ARTHROPLASTY;  Surgeon: Kerin Salen, MD;  Location: [place];  Service: Orthopedics;  Laterality: Right;  . TUBAL LIGATION  1987   Allergies  Allergen Reactions  . Latex Other (See Comments)    "Latex tape used during surgery - breaks my skin out"  . Adhesive [Tape] Rash    "per pt - surgical tape, also causes itching    Social History   Social History  .  Marital status: Widowed    Spouse name: N/A  . Number of children: N/A  . Years of education: N/A   Occupational History  . Restaurant Mgr    Social History Main Topics  . Smoking status: Never Smoker  . Smokeless tobacco: Never Used  . Alcohol use Yes     Comment: a glass of wine/rarely  . Drug use: No  . Sexual activity: Yes   Other Topics Concern  . Not on file   Social History Narrative   Significant other. Education: [employer]. Exercise: Walks three times a week.   Family History  Problem Relation Age of Onset  . Cancer Mother        Lung  . Diabetes Mother   . Stroke Father   . Diabetes Father   . Colon cancer Father 38       anal cancer  . Hypertension Brother   . Bipolar disorder Daughter   . Bipolar disorder Son   . Diabetes Maternal Grandfather   . Heart disease Maternal Grandfather   . Emphysema Paternal Grandmother   . Cancer Paternal Grandfather        stomach       Objective:    BP 124/72   Pulse 67   Temp 97.8 F (36.6 C) (Oral)   Resp 16   Ht 5' 2.21" (1.58 m)   Wt 162 lb (73.5 kg)   SpO2 97%   BMI 29.44 kg/m  Physical Exam  Constitutional: She is oriented to person, place, and time. She appears well-developed and well-nourished. No distress.  HENT:  Head: Normocephalic and atraumatic.  Right Ear: External ear normal.  Left Ear: External ear normal.  Nose: Nose normal.  Mouth/Throat: Oropharynx is clear and moist.  Eyes: Pupils are equal, round, and reactive to light. Conjunctivae and EOM are normal.  Neck: Normal range of motion. Neck supple. Carotid bruit is not present. No thyromegaly present.  Cardiovascular: Normal rate, regular rhythm, normal heart sounds and intact distal pulses.  Exam reveals no gallop and no friction rub.   No murmur heard. Pulmonary/Chest: Effort normal and breath sounds normal. She has no wheezes. She has no rales.  Abdominal: Soft. Bowel sounds are normal. She exhibits no distension and no mass. There is no  tenderness. There is no rebound and no guarding.  Lymphadenopathy:    She has no cervical adenopathy.  Neurological: She is alert and oriented to person, place, and time. No cranial nerve deficit.  Skin: Skin is warm and dry. No rash noted. She is not diaphoretic. No erythema. No pallor.  Psychiatric: She has a normal mood and affect. Her behavior is normal.    No results found. Depression screen [facility] 2/9 11/02/2016 07/30/2016 07/26/2016 06/03/2016 04/21/2016  Decreased Interest 0 0 0 0 0  Down, Depressed, Hopeless 0 0 0 0 0  PHQ - 2 Score 0 0 0 0 0  Altered sleeping - - - - -  Tired, decreased energy - - - - -  Change in appetite - - - - -  Feeling  bad or failure about yourself  - - - - -  Trouble concentrating - - - - -  Moving slowly or fidgety/restless - - - - -  Suicidal thoughts - - - - -  PHQ-9 Score - - - - -   Fall Risk  11/02/2016 07/30/2016 07/26/2016 06/03/2016 04/21/2016  Falls in the past year? No No No No No  Number falls in past yr: - - - - -  Comment - - - - -  Injury with Fall? - - - - -  Risk Factor Category  - - - - -  Risk for fall due to : - - - - -  Follow up - - - - -        Assessment & Plan:   1. Essential hypertension   2. Chronic pain of right knee   3. Chronic pain syndrome   4. Depression with anxiety   5. Glucose intolerance   6. Pure hypercholesterolemia   7. Need for prophylactic vaccination and inoculation against influenza    -improved blood pressure with Losartan/CTZ; obtain labs; continue current medications. -restart Meloxicam daily to help decrease daily opiate intake; consider decreasing dose of hydrocodone to 5mg  at next visit.  - I recommend weight loss, exercise, and low-carbohydrate low-sugar food choices. You should AVOID: regular sodas, sweetened tea, fruit juices.  You should LIMIT: breads, pastas, rice, potatoes, and desserts/sweets.  I would recommend limiting your total carbohydrate intake per meal to 45 grams; I would limit your total  carbohydrate intake per snack to 30 grams.  I would also have a goal of 60 grams of protein intake per day; this would equal 10-15 grams of protein per meal and 5-10 grams of protein per snack. -recommend weight loss, exercise for 30-60 minutes five days per week; recommend 1200 kcal restriction per day with a minimum of 60 grams of protein per day.    Orders Placed This Encounter  Procedures  . Flu Vaccine QUAD 36+ mos IM  . Comprehensive metabolic panel    Order Specific Question:   Has the patient fasted?    Answer:   Yes  . Hemoglobin A1c  . Lipid panel    Order Specific Question:   Has the patient fasted?    Answer:   Yes   Meds ordered this encounter  Medications  . DISCONTD: HYDROcodone-acetaminophen (NORCO) 10-325 MG tablet    Sig: Take 1 tablet by mouth every 6 (six) hours as needed.    Dispense:  124 tablet    Refill:  0  . DISCONTD: HYDROcodone-acetaminophen (NORCO) 10-325 MG tablet    Sig: Take 1 tablet by mouth every 6 (six) hours as needed.    Dispense:  124 tablet    Refill:  0    DO NOT FILL FOR 30 DAYS AFTER BEING PRESCRIBED.  Marland Kitchen HYDROcodone-acetaminophen (NORCO) 10-325 MG tablet    Sig: Take 1 tablet by mouth every 6 (six) hours as needed.    Dispense:  124 tablet    Refill:  0    DO NOT FILL FOR 60 DAYS AFTER BEING PRESCRIBED.  Marland Kitchen meloxicam (MOBIC) 15 MG tablet    Sig: Take 1 tablet (15 mg total) by mouth daily.    Dispense:  90 tablet    Refill:  1    No Follow-up on file.   Kesleigh Morson Elayne Guerin, M.D. Primary Care at [facility] [address] [place], [place]  [address] [phone] phone [phone] fax

## 2016-11-02 NOTE — Patient Instructions (Signed)
     IF you received an x-ray today, you will receive an invoice from [facility]. Please contact [facility] at [phone] with questions or concerns regarding your invoice.   IF you received labwork today, you will receive an invoice from [facility]. Please contact [facility] at [phone] with questions or concerns regarding your invoice.   Our billing staff will not be able to assist you with questions regarding bills from these companies.  You will be contacted with the lab results as soon as they are available. The fastest way to get your results is to activate your My Chart account. Instructions are located on the last page of this paperwork. If you have not heard from us regarding the results in 2 weeks, please contact this office.     

## 2016-11-03 LAB — COMPREHENSIVE METABOLIC PANEL
ALBUMIN: 4.7 g/dL (ref 3.6–4.8)
ALT: 9 IU/L (ref 0–32)
AST: 16 IU/L (ref 0–40)
Albumin/Globulin Ratio: 1.6 (ref 1.2–2.2)
Alkaline Phosphatase: 83 IU/L (ref 39–117)
BUN/Creatinine Ratio: 24 (ref 12–28)
BUN: 15 mg/dL (ref 8–27)
Bilirubin Total: 0.5 mg/dL (ref 0.0–1.2)
CALCIUM: 9.4 mg/dL (ref 8.7–10.3)
CO2: 20 mmol/L (ref 20–29)
CREATININE: 0.63 mg/dL (ref 0.57–1.00)
Chloride: 100 mmol/L (ref 96–106)
GFR calc Af Amer: 110 mL/min/{1.73_m2} (ref >59)
GFR, EST NON AFRICAN AMERICAN: 96 mL/min/{1.73_m2} (ref >59)
Globulin, Total: 2.9 g/dL (ref 1.5–4.5)
Glucose: 91 mg/dL (ref 65–99)
Potassium: 4 mmol/L (ref 3.5–5.2)
Sodium: 138 mmol/L (ref 134–144)
Total Protein: 7.6 g/dL (ref 6.0–8.5)

## 2016-11-03 LAB — LIPID PANEL
CHOLESTEROL TOTAL: 193 mg/dL (ref 100–199)
Chol/HDL Ratio: 3.3 ratio (ref 0.0–4.4)
HDL: 58 mg/dL (ref >39)
LDL Calculated: 106 mg/dL — ABNORMAL HIGH (ref 0–99)
Triglycerides: 147 mg/dL (ref 0–149)
VLDL CHOLESTEROL CAL: 29 mg/dL (ref 5–40)

## 2016-11-03 LAB — HEMOGLOBIN A1C
ESTIMATED AVERAGE GLUCOSE: 134 mg/dL
Hgb A1c MFr Bld: 6.3 % — ABNORMAL HIGH (ref 4.8–5.6)

## 2016-11-10 ENCOUNTER — Other Ambulatory Visit: Payer: Self-pay | Admitting: Family Medicine

## 2016-12-20 ENCOUNTER — Telehealth: Payer: Self-pay

## 2016-12-20 NOTE — Telephone Encounter (Signed)
Error

## 2017-01-09 ENCOUNTER — Other Ambulatory Visit: Payer: Self-pay | Admitting: Family Medicine

## 2017-01-10 NOTE — Telephone Encounter (Signed)
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  She did have an appointment in September.       Please Advise

## 2017-01-28 ENCOUNTER — Telehealth: Payer: Self-pay | Admitting: Family Medicine

## 2017-01-28 NOTE — Telephone Encounter (Signed)
Copied from [place]. Topic: Quick Communication - Rx Refill/Question >> Jan 28, 2017  3:44 PM Carolyn Stare wrote:   Refill    HYDROcodone-acetaminophen [facility]) 10-325 MG tablet  Pt said she has been out of work and will have insurance in January and is asking if this med can be refill    [phone]

## 2017-01-31 ENCOUNTER — Other Ambulatory Visit: Payer: Self-pay | Admitting: Family Medicine

## 2017-02-04 MED ORDER — HYDROCODONE-ACETAMINOPHEN 10-325 MG PO TABS: 1.0000 | ORAL_TABLET | Freq: Four times a day (QID) | ORAL | 0 refills | Status: DC | PRN

## 2017-02-04 NOTE — Addendum Note (Signed)
Addended by: Wardell Honour on: 02/04/2017 06:36 PM   Modules accepted: Orders

## 2017-03-02 ENCOUNTER — Other Ambulatory Visit: Payer: Self-pay

## 2017-03-02 ENCOUNTER — Ambulatory Visit: Payer: BLUE CROSS/BLUE SHIELD | Admitting: Family Medicine

## 2017-03-02 ENCOUNTER — Encounter: Payer: Self-pay | Admitting: Family Medicine

## 2017-03-02 VITALS — BP 148/72 | HR 72 | Temp 98.6°F | Resp 16 | Ht 62.21 in | Wt 166.0 lb

## 2017-03-02 DIAGNOSIS — E7439 Other disorders of intestinal carbohydrate absorption: Secondary | ICD-10-CM | POA: Diagnosis not present

## 2017-03-02 DIAGNOSIS — G894 Chronic pain syndrome: Secondary | ICD-10-CM | POA: Diagnosis not present

## 2017-03-02 DIAGNOSIS — Z23 Encounter for immunization: Secondary | ICD-10-CM | POA: Diagnosis not present

## 2017-03-02 DIAGNOSIS — I10 Essential (primary) hypertension: Secondary | ICD-10-CM | POA: Diagnosis not present

## 2017-03-02 DIAGNOSIS — F418 Other specified anxiety disorders: Secondary | ICD-10-CM

## 2017-03-02 DIAGNOSIS — M1711 Unilateral primary osteoarthritis, right knee: Secondary | ICD-10-CM | POA: Diagnosis not present

## 2017-03-02 DIAGNOSIS — M79644 Pain in right finger(s): Secondary | ICD-10-CM | POA: Diagnosis not present

## 2017-03-02 DIAGNOSIS — M25561 Pain in right knee: Secondary | ICD-10-CM | POA: Diagnosis not present

## 2017-03-02 DIAGNOSIS — M65341 Trigger finger, right ring finger: Secondary | ICD-10-CM | POA: Diagnosis not present

## 2017-03-02 DIAGNOSIS — G8929 Other chronic pain: Secondary | ICD-10-CM

## 2017-03-02 DIAGNOSIS — K219 Gastro-esophageal reflux disease without esophagitis: Secondary | ICD-10-CM

## 2017-03-02 MED ORDER — LOVASTATIN 20 MG PO TABS: 20.0000 mg | ORAL_TABLET | Freq: Every day | ORAL | 1 refills | Status: DC

## 2017-03-02 MED ORDER — MELOXICAM 15 MG PO TABS: 15.0000 mg | ORAL_TABLET | Freq: Every day | ORAL | 1 refills | Status: DC

## 2017-03-02 MED ORDER — GABAPENTIN 300 MG PO CAPS: 300.0000 mg | ORAL_CAPSULE | Freq: Three times a day (TID) | ORAL | 5 refills | Status: DC

## 2017-03-02 MED ORDER — HYDROCODONE-ACETAMINOPHEN 10-325 MG PO TABS: 1.0000 | ORAL_TABLET | Freq: Four times a day (QID) | ORAL | 0 refills | Status: DC | PRN

## 2017-03-02 MED ORDER — LOSARTAN POTASSIUM-HCTZ 100-12.5 MG PO TABS: 1.0000 | ORAL_TABLET | Freq: Every day | ORAL | 1 refills | Status: DC

## 2017-03-02 NOTE — Progress Notes (Signed)
Subjective:    Patient ID: Madison Collins, female    DOB: 1950-09-22, 67 y.o.   MRN: [id number]  03/02/2017  Hypertension (3 month follow-up ) and Pain    HPI This 67 y.o. female presents for 52-month follow-up of hypertension, glucose intolerance, hypercholesterolemia, chronic pain syndrome.  Management changes made at last visit include the following:  -improved blood pressure with Losartan/CTZ; obtain labs; continue current medications. -restart Meloxicam daily to help decrease daily opiate intake; consider decreasing dose of hydrocodone to 5mg  at next visit.   R trigger finger fourth: new onset.  Worsening.  R second finger pain DIP: interested in referral.   HTN: Patient reports good compliance with medication, good tolerance to medication, and good symptom control.   CBD oil: friend uses CBD oil for massages. Does not have THC in it, thus, should not cause UDS to be positive.    R elbow pain: when does not do much and reads; relaxing; those are good days.  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  Pins and needles and plates in 3016.  Mother in law shattered elbow.    R knee pain: weather changing causes knee pain.  S/p R knee replacement; hurts worse now than it did; no longer gives way.  No longer falls up and down steps.  TKR with normal function; 2009.  Four arthroscopic surgeries since 1989; has been hurting since 1989.  Meloxicam does help.  Dr. Mayer Camel did TKR; Dr. Grandville Silos at [facility] did elbow.  After TKR, did therapy.  Clicks.  Does not want anything done to it.     BP Readings from Last 3 Encounters:  03/02/17 (!) 148/72  11/02/16 124/72  07/30/16 (!) 153/72   Wt Readings from Last 3 Encounters:  03/02/17 166 lb (75.3 kg)  11/02/16 162 lb (73.5 kg)  07/30/16 165 lb 6.4 oz (75 kg)   Immunization History  Administered Date(s) Administered  . Hepatitis B, adult 06/04/2013, 07/03/2013  . Influenza Split 11/25/2010, 11/17/2011  . Influenza,inj,Quad PF,6+ Mos  11/08/2012, 04/02/2015, 04/21/2016, 11/02/2016  . Influenza-Unspecified 11/05/2013  . Pneumococcal Conjugate-13 10/24/2006  . Pneumococcal Polysaccharide-23 12/26/2013  . Td 02/23/2007  . Tdap 03/02/2017  . Zoster 09/22/2013  . Zoster Recombinat (Shingrix) 06/22/2016    Review of Systems  Constitutional: Negative for chills, diaphoresis, fatigue and fever.  Eyes: Negative for visual disturbance.  Respiratory: Negative for cough and shortness of breath.   Cardiovascular: Negative for chest pain, palpitations and leg swelling.  Gastrointestinal: Negative for abdominal pain, constipation, diarrhea, nausea and vomiting.  Endocrine: Negative for cold intolerance, heat intolerance, polydipsia, polyphagia and polyuria.  Musculoskeletal: Positive for arthralgias and joint swelling.  Neurological: Negative for dizziness, tremors, seizures, syncope, facial asymmetry, speech difficulty, weakness, light-headedness, numbness and headaches.  Psychiatric/Behavioral: Positive for dysphoric mood. Negative for self-injury, sleep disturbance and suicidal ideas. The patient is nervous/anxious.     Past Medical History:  Diagnosis Date  . Anxiety   . Anxiety and depression   . Arthritis   . Bulging disc 1990's   c4-5  . Cataract    right eye  . Depression   . GERD (gastroesophageal reflux disease)   . H/O: osteoarthritis   . Headache    migraines occasionally  . Hepatitis C    IA viral load low 02/09    BX past mild fibrosis  . Hypertension   . Insomnia, unspecified   . Knee pain   . Post-menopause 2010   Past Surgical History:  Procedure Laterality Date  . BUNIONECTOMY Right 1983  . COLONOSCOPY  10/22/13  . EYE SURGERY Right 2010   hole in macula  . KNEE SURGERY Right     x4  . ORIF ELBOW FRACTURE Right 04/08/2015   Procedure: OPEN REDUCTION INTERNAL FIXATION (ORIF) ELBOW/OLECRANON FRACTURE;  Surgeon: Milly Jakob, MD;  Location: [facility];  Service: Orthopedics;  Laterality: Right;  .  ORIF RADIAL FRACTURE Right 04/08/2015   Procedure: OPEN REDUCTION INTERNAL FIXATION (ORIF) RADIAL HEAD ;  Surgeon: Milly Jakob, MD;  Location: [facility];  Service: Orthopedics;  Laterality: Right;  . OVARIAN CYST REMOVAL    . TOTAL KNEE ARTHROPLASTY Right 12/24/2013   Procedure: RIGHT TOTAL KNEE ARTHROPLASTY;  Surgeon: Kerin Salen, MD;  Location: [facility];  Service: Orthopedics;  Laterality: Right;  . TUBAL LIGATION  1987   Allergies  Allergen Reactions  . Latex Other (See Comments)    "Latex tape used during surgery - breaks my skin out"  . Adhesive [Tape] Rash    "per pt - surgical tape, also causes itching   Current Outpatient Medications on File Prior to Visit  Medication Sig Dispense Refill  . citalopram (CELEXA) 40 MG tablet TAKE ONE TABLET BY MOUTH EVERY MORNING 90 tablet 2  . fluticasone (FLONASE) 50 MCG/ACT nasal spray Place 2 sprays into both nostrils daily. 16 g 11  . omeprazole (PRILOSEC) 20 MG capsule TAKE ONE CAPSULE BY MOUTH EVERY MORNING 90 capsule 2  . SUMAtriptan (IMITREX) 100 MG tablet Take 1 tablet (100 mg total) by mouth every 2 (two) hours as needed for migraine or headache. May repeat in 2 hours if headache persists or recurs. 10 tablet 11   No current facility-administered medications on file prior to visit.    Social History   Socioeconomic History  . Marital status: Widowed    Spouse name: Not on file  . Number of children: Not on file  . Years of education: Not on file  . Highest education level: Not on file  Social Needs  . Financial resource strain: Not on file  . Food insecurity - worry: Not on file  . Food insecurity - inability: Not on file  . Transportation needs - medical: Not on file  . Transportation needs - non-medical: Not on file  Occupational History  . Occupation: Astronomer  Tobacco Use  . Smoking status: Never Smoker  . Smokeless tobacco: Never Used  Substance and Sexual Activity  . Alcohol use: Yes    Comment: a glass of  wine/rarely  . Drug use: No  . Sexual activity: Yes  Other Topics Concern  . Not on file  Social History Narrative   Significant other. Education: [employer]. Exercise: Walks three times a week.   Family History  Problem Relation Age of Onset  . Cancer Mother        Lung  . Diabetes Mother   . Stroke Father   . Diabetes Father   . Colon cancer Father 81       anal cancer  . Hypertension Brother   . Bipolar disorder Daughter   . Bipolar disorder Son   . Diabetes Maternal Grandfather   . Heart disease Maternal Grandfather   . Emphysema Paternal Grandmother   . Cancer Paternal Grandfather        stomach       Objective:    BP (!) 148/72   Pulse 72   Temp 98.6 F (37 C) (Oral)   Resp 16  Ht 5' 2.21" (1.58 m)   Wt 166 lb (75.3 kg)   SpO2 98%   BMI 30.16 kg/m  Physical Exam  Constitutional: She is oriented to person, place, and time. She appears well-developed and well-nourished. No distress.  HENT:  Head: Normocephalic and atraumatic.  Right Ear: External ear normal.  Left Ear: External ear normal.  Nose: Nose normal.  Mouth/Throat: Oropharynx is clear and moist.  Eyes: Conjunctivae and EOM are normal. Pupils are equal, round, and reactive to light.  Neck: Normal range of motion. Neck supple. Carotid bruit is not present. No thyromegaly present.  Cardiovascular: Normal rate, regular rhythm, normal heart sounds and intact distal pulses. Exam reveals no gallop and no friction rub.  No murmur heard. Pulmonary/Chest: Effort normal and breath sounds normal. She has no wheezes. She has no rales.  Abdominal: Soft. Bowel sounds are normal. She exhibits no distension and no mass. There is no tenderness. There is no rebound and no guarding.  Musculoskeletal:  Positive triggering of the right fourth finger.  Lymphadenopathy:    She has no cervical adenopathy.  Neurological: She is alert and oriented to person, place, and time. No cranial nerve deficit.  Skin: Skin is warm and  dry. No rash noted. She is not diaphoretic. No erythema. No pallor.  Psychiatric: She has a normal mood and affect. Her behavior is normal.   No results found. Depression screen [facility] 2/9 03/02/2017 11/02/2016 07/30/2016 07/26/2016 06/03/2016  Decreased Interest 0 0 0 0 0  Down, Depressed, Hopeless 0 0 0 0 0  PHQ - 2 Score 0 0 0 0 0  Altered sleeping - - - - -  Tired, decreased energy - - - - -  Change in appetite - - - - -  Feeling bad or failure about yourself  - - - - -  Trouble concentrating - - - - -  Moving slowly or fidgety/restless - - - - -  Suicidal thoughts - - - - -  PHQ-9 Score - - - - -   Fall Risk  03/02/2017 11/02/2016 07/30/2016 07/26/2016 06/03/2016  Falls in the past year? No No No No No  Number falls in past yr: - - - - -  Comment - - - - -  Injury with Fall? - - - - -  Risk Factor Category  - - - - -  Risk for fall due to : - - - - -  Follow up - - - - -        Assessment & Plan:   1. Acquired trigger finger of right ring finger   2. Pain in right finger(s)   3. Depression with anxiety   4. Glucose intolerance   5. Essential hypertension   6. Gastroesophageal reflux disease without esophagitis   7. Arthritis of knee, right   8. Chronic pain of right knee   9. Chronic pain syndrome     New onset trigger finger of right fourth finger.  Refer to orthopedics for further management.  Ongoing stressors contributing to her depression and anxiety.  Hypertension moderately controlled at this time on current regimen.  Obtain labs for chronic disease management.  S/p TDAP.    Chronic pain syndrome due to multiple knee surgeries.  Obtain urine drug screen.  Continue current chronic pain regimen including gabapentin, meloxicam and hydrocodone.  Orders Placed This Encounter  Procedures  . Tdap vaccine greater than or equal to 7yo IM  . Pain Management Screening Profile (10S)  .  Urinalysis, dipstick only  . Ambulatory referral to Hand Surgery    Referral Priority:    Routine    Referral Type:   Surgical    Referral Reason:   Specialty Services Required    Requested Specialty:   Hand Surgery    Number of Visits Requested:   1   Meds ordered this encounter  Medications  . losartan-hydrochlorothiazide (HYZAAR) 100-12.5 MG tablet    Sig: Take 1 tablet by mouth daily.    Dispense:  90 tablet    Refill:  1  . gabapentin (NEURONTIN) 300 MG capsule    Sig: Take 1 capsule (300 mg total) by mouth 3 (three) times daily.    Dispense:  90 capsule    Refill:  5  . lovastatin (MEVACOR) 20 MG tablet    Sig: Take 1 tablet (20 mg total) by mouth at bedtime.    Dispense:  90 tablet    Refill:  1  . meloxicam (MOBIC) 15 MG tablet    Sig: Take 1 tablet (15 mg total) by mouth daily.    Dispense:  90 tablet    Refill:  1  . HYDROcodone-acetaminophen (NORCO) 10-325 MG tablet    Sig: Take 1 tablet by mouth every 6 (six) hours as needed.    Dispense:  124 tablet    Refill:  0    Return in about 3 months (around 05/31/2017) for complete physical examiniation.   Kristi Elayne Guerin, M.D. Primary Care at [facility] [address] [place], [place]  [phone] phone [phone] fax

## 2017-03-02 NOTE — Patient Instructions (Signed)
     IF you received an x-ray today, you will receive an invoice from [facility]. Please contact [facility] at [phone] with questions or concerns regarding your invoice.   IF you received labwork today, you will receive an invoice from [facility]. Please contact [facility] at [phone] with questions or concerns regarding your invoice.   Our billing staff will not be able to assist you with questions regarding bills from these companies.  You will be contacted with the lab results as soon as they are available. The fastest way to get your results is to activate your My Chart account. Instructions are located on the last page of this paperwork. If you have not heard from us regarding the results in 2 weeks, please contact this office.     

## 2017-03-03 LAB — PMP SCREEN PROFILE (10S), URINE
Amphetamine Scrn, Ur: NEGATIVE ng/mL
BARBITURATE SCREEN URINE: NEGATIVE ng/mL
BENZODIAZEPINE SCREEN, URINE: NEGATIVE ng/mL
CANNABINOIDS UR QL SCN: NEGATIVE ng/mL
CREATININE(CRT), U: 27.5 mg/dL (ref 20.0–300.0)
Cocaine (Metab) Scrn, Ur: NEGATIVE ng/mL
METHADONE SCREEN, URINE: NEGATIVE ng/mL
OPIATE SCREEN URINE: POSITIVE ng/mL — AB
OXYCODONE+OXYMORPHONE UR QL SCN: NEGATIVE ng/mL
PH UR, DRUG SCRN: 6 (ref 4.5–8.9)
PHENCYCLIDINE QUANTITATIVE URINE: NEGATIVE ng/mL
PROPOXYPHENE SCREEN URINE: NEGATIVE ng/mL

## 2017-03-03 LAB — URINALYSIS, DIPSTICK ONLY
Bilirubin, UA: NEGATIVE
Glucose, UA: NEGATIVE
Ketones, UA: NEGATIVE
LEUKOCYTES UA: NEGATIVE
NITRITE UA: NEGATIVE
PH UA: 6.5 (ref 5.0–7.5)
Protein, UA: NEGATIVE
SPEC GRAV UA: 1.009 (ref 1.005–1.030)
UUROB: 0.2 mg/dL (ref 0.2–1.0)

## 2017-03-04 ENCOUNTER — Telehealth: Payer: Self-pay

## 2017-03-04 NOTE — Telephone Encounter (Signed)
Started PA for patient's hydrocodone today.  It received approval.  Tried to call pharmacy, but they were not open yet.  Called patient and notified her.  She was very Patent attorney.

## 2017-03-31 ENCOUNTER — Telehealth: Payer: Self-pay | Admitting: Family Medicine

## 2017-03-31 NOTE — Telephone Encounter (Signed)
Copied from [place] [id number]. Topic: Quick Communication - Rx Refill/Question >> Mar 31, 2017 10:24 AM Lolita Rieger, RMA wrote: Medication: norco 10-325   Has the patient contacted their pharmacy? yes   (Agent: If no, request that the patient contact the pharmacy for the refill.)   Preferred Pharmacy (with phone number or street name): [facility] on [address]   Agent: Please be advised that RX refills may take up to 3 business days. We ask that you follow-up with your pharmacy.

## 2017-04-02 NOTE — Telephone Encounter (Signed)
Pt came in to follow up on prescription for hydrocodone. Pt stated that her medication is normally written for 3 months but it was sent electronically this time instead of printed and was only sent for 1 month. Pt is out of medication and stated that she normally has refills written for 3 months.

## 2017-04-04 ENCOUNTER — Telehealth: Payer: Self-pay

## 2017-04-04 MED ORDER — HYDROCODONE-ACETAMINOPHEN 10-325 MG PO TABS: 1.0000 | ORAL_TABLET | Freq: Four times a day (QID) | ORAL | 0 refills | Status: DC | PRN

## 2017-04-04 NOTE — Telephone Encounter (Signed)
Message from after hours service, [facility], that pt is requesting refill for Norco.  Call to pt to confirm information.  Pt would like to know if you could either send three months worth or allow refills on the prescription so that she doesn't have to call every month but states she understands if this cannot be done.   Patient is requesting a refill of the following medications: Requested Prescriptions    No prescriptions requested or ordered in this encounter   Norco 10-325 take 1 every 6 hours as needed for pain.     Date of patient request: 04/02/2017 Last office visit: 03/02/2017 Date of last refill: 03/02/2017 Last refill amount: #124 Follow up time period per chart: 3 mos - around 05/31/2017

## 2017-04-10 MED ORDER — HYDROCODONE-ACETAMINOPHEN 10-325 MG PO TABS: 1.0000 | ORAL_TABLET | Freq: Four times a day (QID) | ORAL | 0 refills | Status: DC | PRN

## 2017-04-11 ENCOUNTER — Other Ambulatory Visit: Payer: Self-pay | Admitting: Family Medicine

## 2017-04-11 DIAGNOSIS — Z139 Encounter for screening, unspecified: Secondary | ICD-10-CM

## 2017-04-13 DIAGNOSIS — M65341 Trigger finger, right ring finger: Secondary | ICD-10-CM | POA: Insufficient documentation

## 2017-04-13 DIAGNOSIS — M1812 Unilateral primary osteoarthritis of first carpometacarpal joint, left hand: Secondary | ICD-10-CM | POA: Insufficient documentation

## 2017-04-13 DIAGNOSIS — M47812 Spondylosis without myelopathy or radiculopathy, cervical region: Secondary | ICD-10-CM | POA: Insufficient documentation

## 2017-04-13 DIAGNOSIS — G5603 Carpal tunnel syndrome, bilateral upper limbs: Secondary | ICD-10-CM | POA: Insufficient documentation

## 2017-04-27 NOTE — Telephone Encounter (Signed)
Pt wants to know did Dr Tamala Julian send over the other 2 refills? Please advise

## 2017-05-11 ENCOUNTER — Other Ambulatory Visit: Payer: Self-pay | Admitting: Family Medicine

## 2017-05-11 MED ORDER — SUMATRIPTAN SUCCINATE 100 MG PO TABS: 100.0000 mg | ORAL_TABLET | Freq: Once | ORAL | 2 refills | Status: DC

## 2017-05-11 NOTE — Progress Notes (Signed)
Received refill request from [facility] for IMITREX 100MG .

## 2017-05-12 ENCOUNTER — Ambulatory Visit: Payer: BLUE CROSS/BLUE SHIELD

## 2017-05-31 ENCOUNTER — Ambulatory Visit: Payer: Self-pay

## 2017-05-31 ENCOUNTER — Ambulatory Visit
Admission: RE | Admit: 2017-05-31 | Discharge: 2017-05-31 | Disposition: A | Discharge status: Home / Self Care | Payer: BLUE CROSS/BLUE SHIELD | Source: Ambulatory Visit | Attending: Family Medicine | Admitting: Family Medicine

## 2017-05-31 DIAGNOSIS — Z139 Encounter for screening, unspecified: Secondary | ICD-10-CM

## 2017-06-01 ENCOUNTER — Encounter: Payer: Self-pay | Admitting: Physician Assistant

## 2017-06-15 ENCOUNTER — Ambulatory Visit (INDEPENDENT_AMBULATORY_CARE_PROVIDER_SITE_OTHER): Payer: BLUE CROSS/BLUE SHIELD | Admitting: Family Medicine

## 2017-06-15 ENCOUNTER — Other Ambulatory Visit: Payer: Self-pay

## 2017-06-15 ENCOUNTER — Encounter: Payer: Self-pay | Admitting: Family Medicine

## 2017-06-15 VITALS — BP 122/72 | HR 90 | Temp 98.0°F | Resp 16 | Ht 61.02 in | Wt 164.0 lb

## 2017-06-15 DIAGNOSIS — G894 Chronic pain syndrome: Secondary | ICD-10-CM | POA: Diagnosis not present

## 2017-06-15 DIAGNOSIS — L989 Disorder of the skin and subcutaneous tissue, unspecified: Secondary | ICD-10-CM

## 2017-06-15 DIAGNOSIS — F5104 Psychophysiologic insomnia: Secondary | ICD-10-CM | POA: Diagnosis not present

## 2017-06-15 DIAGNOSIS — Z0000 Encounter for general adult medical examination without abnormal findings: Secondary | ICD-10-CM

## 2017-06-15 DIAGNOSIS — M25561 Pain in right knee: Secondary | ICD-10-CM

## 2017-06-15 DIAGNOSIS — Z124 Encounter for screening for malignant neoplasm of cervix: Secondary | ICD-10-CM

## 2017-06-15 DIAGNOSIS — K219 Gastro-esophageal reflux disease without esophagitis: Secondary | ICD-10-CM

## 2017-06-15 DIAGNOSIS — I10 Essential (primary) hypertension: Secondary | ICD-10-CM | POA: Diagnosis not present

## 2017-06-15 DIAGNOSIS — F418 Other specified anxiety disorders: Secondary | ICD-10-CM | POA: Diagnosis not present

## 2017-06-15 DIAGNOSIS — M5010 Cervical disc disorder with radiculopathy, unspecified cervical region: Secondary | ICD-10-CM

## 2017-06-15 DIAGNOSIS — E7439 Other disorders of intestinal carbohydrate absorption: Secondary | ICD-10-CM

## 2017-06-15 DIAGNOSIS — Z9109 Other allergy status, other than to drugs and biological substances: Secondary | ICD-10-CM | POA: Diagnosis not present

## 2017-06-15 DIAGNOSIS — G8929 Other chronic pain: Secondary | ICD-10-CM

## 2017-06-15 DIAGNOSIS — E034 Atrophy of thyroid (acquired): Secondary | ICD-10-CM

## 2017-06-15 DIAGNOSIS — R3129 Other microscopic hematuria: Secondary | ICD-10-CM

## 2017-06-15 DIAGNOSIS — G43709 Chronic migraine without aura, not intractable, without status migrainosus: Secondary | ICD-10-CM

## 2017-06-15 DIAGNOSIS — M1711 Unilateral primary osteoarthritis, right knee: Secondary | ICD-10-CM

## 2017-06-15 MED ORDER — GABAPENTIN 300 MG PO CAPS: 300.0000 mg | ORAL_CAPSULE | Freq: Three times a day (TID) | ORAL | 5 refills | Status: DC

## 2017-06-15 MED ORDER — SUMATRIPTAN SUCCINATE 100 MG PO TABS: 100.0000 mg | ORAL_TABLET | Freq: Once | ORAL | 2 refills | Status: DC

## 2017-06-15 MED ORDER — CITALOPRAM HYDROBROMIDE 40 MG PO TABS: 40.0000 mg | ORAL_TABLET | Freq: Every morning | ORAL | 1 refills | Status: DC

## 2017-06-15 MED ORDER — FLUTICASONE PROPIONATE 50 MCG/ACT NA SUSP: 2.0000 | Freq: Every day | NASAL | 11 refills | Status: DC

## 2017-06-15 MED ORDER — LOSARTAN POTASSIUM-HCTZ 100-12.5 MG PO TABS: 1.0000 | ORAL_TABLET | Freq: Every day | ORAL | 1 refills | Status: DC

## 2017-06-15 MED ORDER — HYDROCODONE-ACETAMINOPHEN 10-325 MG PO TABS: 1.0000 | ORAL_TABLET | Freq: Four times a day (QID) | ORAL | 0 refills | Status: DC | PRN

## 2017-06-15 MED ORDER — MELOXICAM 15 MG PO TABS: 15.0000 mg | ORAL_TABLET | Freq: Every day | ORAL | 1 refills | Status: DC

## 2017-06-15 MED ORDER — LOVASTATIN 20 MG PO TABS: 20.0000 mg | ORAL_TABLET | Freq: Every day | ORAL | 3 refills | Status: DC

## 2017-06-15 MED ORDER — OMEPRAZOLE 20 MG PO CPDR: 20.0000 mg | DELAYED_RELEASE_CAPSULE | Freq: Every morning | ORAL | 3 refills | Status: DC

## 2017-06-15 NOTE — Patient Instructions (Addendum)
Madison Brigitte Pulse, MD Tanja Port, MD  IF you received an x-ray today, you will receive an invoice from [facility]. Please contact [facility] at [phone] with questions or concerns regarding your invoice.   IF you received labwork today, you will receive an invoice from [facility]. Please contact [facility] at [phone] with questions or concerns regarding your invoice.   Our billing staff will not be able to assist you with questions regarding bills from these companies.  You will be contacted with the lab results as soon as they are available. The fastest way to get your results is to activate your My Chart account. Instructions are located on the last page of this paperwork. If you have not heard from [place] regarding the results in 2 weeks, please contact this office.      Preventive Care 40-64 Years, Female Preventive care refers to lifestyle choices and visits with your health care provider that can promote health and wellness. What does preventive care include?  A yearly physical exam. This is also called an annual well check.  Dental exams once or twice a year.  Routine eye exams. Ask your health care provider how often you should have your eyes checked.  Personal lifestyle choices, including: ? Daily care of your teeth and gums. ? Regular physical activity. ? Eating a healthy diet. ? Avoiding tobacco and drug use. ? Limiting alcohol use. ? Practicing safe sex. ? Taking low-dose aspirin daily starting at age 54. ? Taking vitamin and mineral supplements as recommended by your health care provider. What happens during an annual well check? The services and screenings done by your health care provider during your annual well check will depend on your age, overall health, lifestyle risk factors, and family history of disease. Counseling Your health care provider may ask you questions about your:  Alcohol use.  Tobacco use.  Drug use.  Emotional  well-being.  Home and relationship well-being.  Sexual activity.  Eating habits.  Work and work Statistician.  Method of birth control.  Menstrual cycle.  Pregnancy history.  Screening You may have the following tests or measurements:  Height, weight, and BMI.  Blood pressure.  Lipid and cholesterol levels. These may be checked every 5 years, or more frequently if you are over 72 years old.  Skin check.  Lung cancer screening. You may have this screening every year starting at age 50 if you have a 30-pack-year history of smoking and currently smoke or have quit within the past 15 years.  Fecal occult blood test (FOBT) of the stool. You may have this test every year starting at age 52.  Flexible sigmoidoscopy or colonoscopy. You may have a sigmoidoscopy every 5 years or a colonoscopy every 10 years starting at age 70.  Hepatitis C blood test.  Hepatitis B blood test.  Sexually transmitted disease (STD) testing.  Diabetes screening. This is done by checking your blood sugar (glucose) after you have not eaten for a while (fasting). You may have this done every 1-3 years.  Mammogram. This may be done every 1-2 years. Talk to your health care provider about when you should start having regular mammograms. This may depend on whether you have a family history of breast cancer.  BRCA-related cancer screening. This may be done if you have a family history of breast, ovarian, tubal, or peritoneal cancers.  Pelvic exam and Pap test. This may be done every 3 years starting at age 2. Starting at age 30, this may be  done every 5 years if you have a Pap test in combination with an HPV test.  Bone density scan. This is done to screen for osteoporosis. You may have this scan if you are at high risk for osteoporosis.  Discuss your test results, treatment options, and if necessary, the need for more tests with your health care provider. Vaccines Your health care provider may recommend  certain vaccines, such as:  Influenza vaccine. This is recommended every year.  Tetanus, diphtheria, and acellular pertussis (Tdap, Td) vaccine. You may need a Td booster every 10 years.  Varicella vaccine. You may need this if you have not been vaccinated.  Zoster vaccine. You may need this after age 18.  Measles, mumps, and rubella (MMR) vaccine. You may need at least one dose of MMR if you were born in 1957 or later. You may also need a second dose.  Pneumococcal 13-valent conjugate (PCV13) vaccine. You may need this if you have certain conditions and were not previously vaccinated.  Pneumococcal polysaccharide (PPSV23) vaccine. You may need one or two doses if you smoke cigarettes or if you have certain conditions.  Meningococcal vaccine. You may need this if you have certain conditions.  Hepatitis A vaccine. You may need this if you have certain conditions or if you travel or work in places where you may be exposed to hepatitis A.  Hepatitis B vaccine. You may need this if you have certain conditions or if you travel or work in places where you may be exposed to hepatitis B.  Haemophilus influenzae type b (Hib) vaccine. You may need this if you have certain conditions.  Talk to your health care provider about which screenings and vaccines you need and how often you need them. This information is not intended to replace advice given to you by your health care provider. Make sure you discuss any questions you have with your health care provider. Document Released: 03/07/2015 Document Revised: 10/29/2015 Document Reviewed: 12/10/2014 Elsevier Interactive Patient Education  [employer].

## 2017-06-15 NOTE — Progress Notes (Signed)
Subjective:    Patient ID: Madison Collins, female    DOB: September 06, 1950, 67 y.o.   MRN: [id number]  06/15/2017  Annual Exam    HPI This 67 y.o. female presents for COMPLETE PHYSICAL EXAMINATION.   Visual Acuity Screening   Right eye Left eye Both eyes  Without correction: 20/25 20/30 20/20   With correction:         Immunization History  Administered Date(s) Administered  . Hepatitis B, adult 06/04/2013, 07/03/2013  . Influenza Split 11/25/2010, 11/17/2011, 04/02/2015  . Influenza,inj,Quad PF,6+ Mos 11/08/2012, 04/02/2015, 04/21/2016, 11/02/2016  . Influenza-Unspecified 11/05/2013  . Pneumococcal Conjugate-13 10/24/2006  . Pneumococcal Polysaccharide-23 12/26/2013  . Td 02/23/2007  . Tdap 02/23/2007, 03/02/2017  . Zoster 09/22/2013  . Zoster Recombinat (Shingrix) 06/22/2016   Health Maintenance  Topic Date Due  . INFLUENZA VACCINE  09/22/2017  . COLONOSCOPY  10/23/2018  . MAMMOGRAM  06/01/2019  . PAP SMEAR  06/15/2020  . TETANUS/TDAP  03/03/2027  . Hepatitis C Screening  Completed  . HIV Screening  Completed   Cervical spine DDD with radiculopathy:  Scheduled for MRI of cervical spine in upcoming month.  B carpal tunnel syndrome: s/p injection in LEFT wrist.    De Quervains tenosynovitis, trigger middle finger: lots of braces; s/p injection.  S/p cataract surgery; last eye exam two years ago.  Anxiety: gabapentin tid helps with anxiety.  Chronic pain syndrome: mostly hydrocodone three per day; some days four. Has chronic knee pain; s/p multiple surgeries.   Review of Systems  Constitutional: Negative for activity change, appetite change, chills, diaphoresis, fatigue, fever and unexpected weight change.  HENT: Negative for congestion, dental problem, drooling, ear discharge, ear pain, facial swelling, hearing loss, mouth sores, nosebleeds, postnasal drip, rhinorrhea, sinus pressure, sneezing, sore throat, tinnitus, trouble swallowing and voice change.     Eyes: Negative for photophobia, pain, discharge, redness, itching and visual disturbance.  Respiratory: Negative for apnea, cough, choking, chest tightness, shortness of breath, wheezing and stridor.   Cardiovascular: Negative for chest pain, palpitations and leg swelling.  Gastrointestinal: Negative for abdominal distention, abdominal pain, anal bleeding, blood in stool, constipation, diarrhea, nausea, rectal pain and vomiting.  Endocrine: Negative for cold intolerance, heat intolerance, polydipsia, polyphagia and polyuria.  Genitourinary: Negative for decreased urine volume, difficulty urinating, dyspareunia, dysuria, enuresis, flank pain, frequency, genital sores, hematuria, menstrual problem, pelvic pain, urgency, vaginal bleeding, vaginal discharge and vaginal pain.       Nocturia x 1.  Urinary leakage with laughing, coughing.  Musculoskeletal: Positive for arthralgias, neck pain and neck stiffness. Negative for back pain, gait problem, joint swelling and myalgias.  Skin: Negative for color change, pallor, rash and wound.  Allergic/Immunologic: Negative for environmental allergies, food allergies and immunocompromised state.  Neurological: Negative for dizziness, tremors, seizures, syncope, facial asymmetry, speech difficulty, weakness, light-headedness, numbness and headaches.  Hematological: Negative for adenopathy. Does not bruise/bleed easily.  Psychiatric/Behavioral: Negative for agitation, behavioral problems, confusion, decreased concentration, dysphoric mood, hallucinations, self-injury, sleep disturbance and suicidal ideas. The patient is nervous/anxious. The patient is not hyperactive.        Bedtime 1100; wakes up 500-700.    Past Medical History:  Diagnosis Date  . Anxiety   . Anxiety and depression   . Arthritis   . Bulging disc 1990's   c4-5  . Cataract    right eye  . Depression   . GERD (gastroesophageal reflux disease)   . H/O: osteoarthritis   . Headache     migraines occasionally  .  Hepatitis C    IA viral load low 02/09    BX past mild fibrosis  . Hypertension   . Insomnia, unspecified   . Knee pain   . Post-menopause 2010   Past Surgical History:  Procedure Laterality Date  . BREAST BIOPSY Left   . BUNIONECTOMY Right 1983  . COLONOSCOPY  10/22/13  . EYE SURGERY Right 2010   hole in macula  . KNEE SURGERY Right     x4  . ORIF ELBOW FRACTURE Right 04/08/2015   Procedure: OPEN REDUCTION INTERNAL FIXATION (ORIF) ELBOW/OLECRANON FRACTURE;  Surgeon: Milly Jakob, MD;  Location: [facility];  Service: Orthopedics;  Laterality: Right;  . ORIF RADIAL FRACTURE Right 04/08/2015   Procedure: OPEN REDUCTION INTERNAL FIXATION (ORIF) RADIAL HEAD ;  Surgeon: Milly Jakob, MD;  Location: [facility];  Service: Orthopedics;  Laterality: Right;  . OVARIAN CYST REMOVAL    . TOTAL KNEE ARTHROPLASTY Right 12/24/2013   Procedure: RIGHT TOTAL KNEE ARTHROPLASTY;  Surgeon: Kerin Salen, MD;  Location: [facility];  Service: Orthopedics;  Laterality: Right;  . TUBAL LIGATION  1987   Allergies  Allergen Reactions  . Latex Other (See Comments)    "Latex tape used during surgery - breaks my skin out"  . Adhesive [Tape] Rash    "per pt - surgical tape, also causes itching   No current outpatient medications on file prior to visit.   No current facility-administered medications on file prior to visit.    Social History   Socioeconomic History  . Marital status: Widowed    Spouse name: Not on file  . Number of children: Not on file  . Years of education: Not on file  . Highest education level: Not on file  Occupational History  . Occupation: Astronomer  Social Needs  . Financial resource strain: Not on file  . Food insecurity:    Worry: Not on file    Inability: Not on file  . Transportation needs:    Medical: Not on file    Non-medical: Not on file  Tobacco Use  . Smoking status: Never Smoker  . Smokeless tobacco: Never Used  Substance and Sexual  Activity  . Alcohol use: Yes    Comment: a glass of wine/rarely  . Drug use: No  . Sexual activity: Yes    Birth control/protection: Post-menopausal  Lifestyle  . Physical activity:    Days per week: Not on file    Minutes per session: Not on file  . Stress: Not on file  Relationships  . Social connections:    Talks on phone: Not on file    Gets together: Not on file    Attends religious service: Not on file    Active member of club or organization: Not on file    Attends meetings of clubs or organizations: Not on file    Relationship status: Not on file  . Intimate partner violence:    Fear of current or ex partner: Not on file    Emotionally abused: Not on file    Physically abused: Not on file    Forced sexual activity: Not on file  Other Topics Concern  . Not on file  Social History Narrative   Marital status: Widower since 2009 from Hepatitis C; + Significant other.       Children:  2 children; 5 grandchildren; no gg.      Lives: with significant Alycia Rossetti).      Employment: [employer] 3 hours per  day Monday-Friday      Tobacco: never      Alcohol: special occasions      Drugs: none     Education:  College.       Exercise: no formal exercise; very busy with grandchildren and work.   Family History  Problem Relation Age of Onset  . Cancer Mother        Lung with brain mets  . Diabetes Mother   . Stroke Father 26  . Diabetes Father   . Colon cancer Father 9       colorectal/anal cancer  . Hypertension Brother   . Bipolar disorder Daughter   . Bipolar disorder Son   . Diabetes Maternal Grandfather   . Heart disease Maternal Grandfather   . Emphysema Paternal Grandmother   . Cancer Paternal Grandfather        stomach  . Breast cancer Maternal Aunt        Objective:    BP 122/72   Pulse 90   Temp 98 F (36.7 C) (Oral)   Resp 16   Ht 5' 1.02" (1.55 m)   Wt 164 lb (74.4 kg)   SpO2 97%   BMI 30.96 kg/m  Physical Exam  Constitutional: She is  oriented to person, place, and time. She appears well-developed and well-nourished. No distress.  HENT:  Head: Normocephalic and atraumatic.  Right Ear: Hearing, tympanic membrane, external ear and ear canal normal.  Left Ear: Hearing, tympanic membrane, external ear and ear canal normal.  Nose: Nose normal.  Mouth/Throat: Oropharynx is clear and moist.  Eyes: Pupils are equal, round, and reactive to light. Conjunctivae and EOM are normal.  Neck: Normal range of motion and full passive range of motion without pain. Neck supple. No JVD present. Carotid bruit is not present. No thyromegaly present.  Cardiovascular: Normal rate, regular rhythm, normal heart sounds and intact distal pulses. Exam reveals no gallop and no friction rub.  No murmur heard. Pulmonary/Chest: Effort normal and breath sounds normal. No respiratory distress. She has no wheezes. She has no rales. Right breast exhibits no inverted nipple, no mass, no nipple discharge, no skin change and no tenderness. Left breast exhibits no inverted nipple, no mass, no nipple discharge, no skin change and no tenderness. Breasts are symmetrical.  Abdominal: Soft. Bowel sounds are normal. She exhibits no distension and no mass. There is no tenderness. There is no rebound and no guarding. Hernia confirmed negative in the right inguinal area and confirmed negative in the left inguinal area.  Genitourinary: Vagina normal and uterus normal. There is no rash, tenderness or lesion on the right labia. There is no rash, tenderness or lesion on the left labia. Cervix exhibits no motion tenderness, no discharge and no friability. Right adnexum displays no mass, no tenderness and no fullness. Left adnexum displays no mass, no tenderness and no fullness.  Musculoskeletal:       Right shoulder: Normal.       Left shoulder: Normal.       Cervical back: Normal.  Lymphadenopathy:    She has no cervical adenopathy. No inguinal adenopathy noted on the right or left  side.  Neurological: She is alert and oriented to person, place, and time. She has normal reflexes. No cranial nerve deficit. She exhibits normal muscle tone. Coordination normal.  Skin: Skin is warm and dry. No rash noted. She is not diaphoretic. No erythema. No pallor.  Psychiatric: She has a normal mood and affect. Her behavior is  normal. Judgment and thought content normal.  Nursing note and vitals reviewed.  No results found. Depression screen [facility] 2/9 06/15/2017 03/02/2017 11/02/2016 07/30/2016 07/26/2016  Decreased Interest 0 0 0 0 0  Down, Depressed, Hopeless 0 0 0 0 0  PHQ - 2 Score 0 0 0 0 0  Altered sleeping - - - - -  Tired, decreased energy - - - - -  Change in appetite - - - - -  Feeling bad or failure about yourself  - - - - -  Trouble concentrating - - - - -  Moving slowly or fidgety/restless - - - - -  Suicidal thoughts - - - - -  PHQ-9 Score - - - - -   Fall Risk  06/15/2017 03/02/2017 11/02/2016 07/30/2016 07/26/2016  Falls in the past year? No No No No No  Number falls in past yr: - - - - -  Comment - - - - -  Injury with Fall? - - - - -  Risk Factor Category  - - - - -  Risk for fall due to : - - - - -  Follow up - - - - -        Assessment & Plan:   1. Routine physical examination   2. Depression with anxiety   3. Essential hypertension   4. Psychophysiological insomnia   5. Gastroesophageal reflux disease without esophagitis   6. Glucose intolerance   7. Chronic pain syndrome   8. Microscopic hematuria   9. Hypothyroidism due to acquired atrophy of thyroid   10. Cervical cancer screening   11. Skin lesions   12. Environmental allergies     -anticipatory guidance provided --- exercise, weight loss, safe driving practices, aspirin 81mg  daily. -obtain age appropriate screening labs and labs for chronic disease management. -Pap smear obtained for cervical cancer screening.  Patient postmenopausal.  No contraception warranted. -Multiple skin lesions: Refer to  dermatology for further evaluation. Refills of chronic medications for chronic disease states refilled today without adjustments. Chronic pain syndrome: Secondary to chronic knee pain following total knee replacement 2015.  Patient currently taking 3 tablets daily and sometimes 4 tablets daily of hydrocodone.  Encourage patient to decrease use of opiate as much as possible.   Orders Placed This Encounter  Procedures  . Microscopic Examination  . CBC with Differential/Platelet  . Comprehensive metabolic panel    Order Specific Question:   Has the patient fasted?    Answer:   No  . Lipid panel    Order Specific Question:   Has the patient fasted?    Answer:   No  . TSH  . T4, free  . Hemoglobin A1c  . Microalbumin / creatinine urine ratio  . Urinalysis, dipstick only  . Urinalysis, Routine w reflex microscopic  . Ambulatory referral to Dermatology    Referral Priority:   Routine    Referral Type:   Consultation    Referral Reason:   Specialty Services Required    Requested Specialty:   Dermatology    Number of Visits Requested:   1  . EKG 12-Lead   Meds ordered this encounter  Medications  . citalopram (CELEXA) 40 MG tablet    Sig: Take 1 tablet (40 mg total) by mouth every morning.    Dispense:  90 tablet    Refill:  1  . fluticasone (FLONASE) 50 MCG/ACT nasal spray    Sig: Place 2 sprays into both nostrils daily.  Dispense:  16 g    Refill:  11  . gabapentin (NEURONTIN) 300 MG capsule    Sig: Take 1 capsule (300 mg total) by mouth 3 (three) times daily.    Dispense:  90 capsule    Refill:  5  . losartan-hydrochlorothiazide (HYZAAR) 100-12.5 MG tablet    Sig: Take 1 tablet by mouth daily.    Dispense:  90 tablet    Refill:  1  . lovastatin (MEVACOR) 20 MG tablet    Sig: Take 1 tablet (20 mg total) by mouth at bedtime.    Dispense:  90 tablet    Refill:  3  . meloxicam (MOBIC) 15 MG tablet    Sig: Take 1 tablet (15 mg total) by mouth daily.    Dispense:  90 tablet     Refill:  1  . omeprazole (PRILOSEC) 20 MG capsule    Sig: Take 1 capsule (20 mg total) by mouth every morning.    Dispense:  90 capsule    Refill:  3  . DISCONTD: SUMAtriptan (IMITREX) 100 MG tablet    Sig: Take 1 tablet (100 mg total) by mouth once for 1 dose. May repeat in 2 hours if headache persists or recurs.    Dispense:  8 tablet    Refill:  2  . DISCONTD: HYDROcodone-acetaminophen (NORCO) 10-325 MG tablet    Sig: Take 1 tablet by mouth every 6 (six) hours as needed.    Dispense:  100 tablet    Refill:  0    DO NOT FILL UNTIL 05-31-17.  DX: CHRONIC KNEE PAIN.  . SUMAtriptan (IMITREX) 100 MG tablet    Sig: Take 1 tablet (100 mg total) by mouth once for 1 dose. May repeat in 2 hours if headache persists or recurs.    Dispense:  8 tablet    Refill:  2    Return in about 3 months (around 09/14/2017) for recheck.   Reakwon Barren Elayne Guerin, M.D. [facility] at [facility] [address] [place],   [address] [phone] phone [phone] fax

## 2017-06-16 LAB — COMPREHENSIVE METABOLIC PANEL
ALT: 13 IU/L (ref 0–32)
AST: 15 IU/L (ref 0–40)
Albumin/Globulin Ratio: 1.9 (ref 1.2–2.2)
Albumin: 4.7 g/dL (ref 3.6–4.8)
Alkaline Phosphatase: 79 IU/L (ref 39–117)
BUN/Creatinine Ratio: 31 — ABNORMAL HIGH (ref 12–28)
BUN: 22 mg/dL (ref 8–27)
Bilirubin Total: 0.4 mg/dL (ref 0.0–1.2)
CALCIUM: 9.3 mg/dL (ref 8.7–10.3)
CHLORIDE: 103 mmol/L (ref 96–106)
CO2: 22 mmol/L (ref 20–29)
Creatinine, Ser: 0.72 mg/dL (ref 0.57–1.00)
GFR calc non Af Amer: 89 mL/min/{1.73_m2} (ref >59)
GFR, EST AFRICAN AMERICAN: 102 mL/min/{1.73_m2} (ref >59)
GLUCOSE: 84 mg/dL (ref 65–99)
Globulin, Total: 2.5 g/dL (ref 1.5–4.5)
Potassium: 4.2 mmol/L (ref 3.5–5.2)
Sodium: 140 mmol/L (ref 134–144)
TOTAL PROTEIN: 7.2 g/dL (ref 6.0–8.5)

## 2017-06-16 LAB — URINALYSIS, DIPSTICK ONLY
Bilirubin, UA: NEGATIVE
GLUCOSE, UA: NEGATIVE
KETONES UA: NEGATIVE
Leukocytes, UA: NEGATIVE
NITRITE UA: NEGATIVE
Protein, UA: NEGATIVE
Specific Gravity, UA: 1.02 (ref 1.005–1.030)
UUROB: 0.2 mg/dL (ref 0.2–1.0)
pH, UA: 7 (ref 5.0–7.5)

## 2017-06-16 LAB — CBC WITH DIFFERENTIAL/PLATELET
BASOS ABS: 0 10*3/uL (ref 0.0–0.2)
Basos: 0 %
EOS (ABSOLUTE): 0.1 10*3/uL (ref 0.0–0.4)
Eos: 1 %
Hematocrit: 40.7 % (ref 34.0–46.6)
Hemoglobin: 13 g/dL (ref 11.1–15.9)
IMMATURE GRANS (ABS): 0 10*3/uL (ref 0.0–0.1)
IMMATURE GRANULOCYTES: 0 %
LYMPHS: 32 %
Lymphocytes Absolute: 3.1 10*3/uL (ref 0.7–3.1)
MCH: 29.4 pg (ref 26.6–33.0)
MCHC: 31.9 g/dL (ref 31.5–35.7)
MCV: 92 fL (ref 79–97)
Monocytes Absolute: 0.7 10*3/uL (ref 0.1–0.9)
Monocytes: 7 %
NEUTROS PCT: 60 %
Neutrophils Absolute: 5.6 10*3/uL (ref 1.4–7.0)
PLATELETS: 274 10*3/uL (ref 150–379)
RBC: 4.42 x10E6/uL (ref 3.77–5.28)
RDW: 14.6 % (ref 12.3–15.4)
WBC: 9.5 10*3/uL (ref 3.4–10.8)

## 2017-06-16 LAB — MICROALBUMIN / CREATININE URINE RATIO
Creatinine, Urine: 98.8 mg/dL
MICROALB/CREAT RATIO: 5.5 mg/g{creat} (ref 0.0–30.0)
MICROALBUM., U, RANDOM: 5.4 ug/mL

## 2017-06-16 LAB — T4, FREE: FREE T4: 1.26 ng/dL (ref 0.82–1.77)

## 2017-06-16 LAB — LIPID PANEL
Chol/HDL Ratio: 3.1 ratio (ref 0.0–4.4)
Cholesterol, Total: 198 mg/dL (ref 100–199)
HDL: 64 mg/dL (ref >39)
LDL Calculated: 110 mg/dL — ABNORMAL HIGH (ref 0–99)
TRIGLYCERIDES: 121 mg/dL (ref 0–149)
VLDL CHOLESTEROL CAL: 24 mg/dL (ref 5–40)

## 2017-06-16 LAB — HEMOGLOBIN A1C
ESTIMATED AVERAGE GLUCOSE: 128 mg/dL
HEMOGLOBIN A1C: 6.1 % — AB (ref 4.8–5.6)

## 2017-06-16 LAB — TSH: TSH: 0.833 u[IU]/mL (ref 0.450–4.500)

## 2017-06-18 LAB — PAP IG AND HPV HIGH-RISK
HPV, HIGH-RISK: NEGATIVE
PAP Smear Comment: 0

## 2017-06-19 ENCOUNTER — Other Ambulatory Visit: Payer: Self-pay | Admitting: Family Medicine

## 2017-06-19 DIAGNOSIS — R3129 Other microscopic hematuria: Secondary | ICD-10-CM

## 2017-06-22 LAB — URINE CULTURE: Organism ID, Bacteria: NO GROWTH

## 2017-06-22 LAB — URINALYSIS, ROUTINE W REFLEX MICROSCOPIC
Bilirubin, UA: NEGATIVE
Glucose, UA: NEGATIVE
Ketones, UA: NEGATIVE
LEUKOCYTES UA: NEGATIVE
Nitrite, UA: NEGATIVE
PH UA: 6 (ref 5.0–7.5)
Protein, UA: NEGATIVE
Specific Gravity, UA: 1.023 (ref 1.005–1.030)
Urobilinogen, Ur: 0.2 mg/dL (ref 0.2–1.0)

## 2017-06-22 LAB — MICROSCOPIC EXAMINATION: CASTS: NONE SEEN /LPF

## 2017-06-26 ENCOUNTER — Other Ambulatory Visit: Payer: Self-pay | Admitting: Family Medicine

## 2017-06-26 DIAGNOSIS — R3129 Other microscopic hematuria: Secondary | ICD-10-CM

## 2017-06-27 ENCOUNTER — Telehealth: Payer: Self-pay

## 2017-06-27 ENCOUNTER — Telehealth: Payer: Self-pay | Admitting: Family Medicine

## 2017-06-27 NOTE — Telephone Encounter (Signed)
She called in and was given urine results ordered by Dr. Tamala Julian on 06/19/17.  Urine with blood present.  No evidence of urinary tract infection as a cause of this persistent blood.  Thus, I recommend  a  referral to a urologist for further evaluation.  She does not have a preference on a urologist, just whoever Dr. Tamala Julian recommends.    20 plus years ago she was evaluated for blood in her urine by Dr. Jules Schick (? Retired now).  They did a CT or MRI scan at that time.    They never found the cause but that was 20 plus years ago and she does want to follow up at this point.    I routed this note to Dr. Reginia Forts at [facility] at [facility].

## 2017-06-27 NOTE — Telephone Encounter (Signed)
Already done.   Copied from [place] [id number]. Topic: Quick Communication - Other Results >> Jun 24, 2017 10:17 AM Antonieta Iba C wrote: Pt called in for her results. [phone]

## 2017-07-19 ENCOUNTER — Encounter: Payer: Self-pay | Admitting: Family Medicine

## 2017-07-24 ENCOUNTER — Emergency Department (HOSPITAL_COMMUNITY)
Admission: EM | Admit: 2017-07-24 | Discharge: 2017-07-24 | Disposition: A | Discharge status: Home / Self Care | Payer: BLUE CROSS/BLUE SHIELD | Attending: Emergency Medicine | Admitting: Emergency Medicine

## 2017-07-24 ENCOUNTER — Encounter (HOSPITAL_COMMUNITY): Payer: Self-pay | Admitting: Emergency Medicine

## 2017-07-24 DIAGNOSIS — Z96651 Presence of right artificial knee joint: Secondary | ICD-10-CM | POA: Diagnosis not present

## 2017-07-24 DIAGNOSIS — H5711 Ocular pain, right eye: Secondary | ICD-10-CM | POA: Diagnosis not present

## 2017-07-24 DIAGNOSIS — I10 Essential (primary) hypertension: Secondary | ICD-10-CM | POA: Diagnosis not present

## 2017-07-24 DIAGNOSIS — Z79899 Other long term (current) drug therapy: Secondary | ICD-10-CM | POA: Diagnosis not present

## 2017-07-24 MED ORDER — TETRACAINE HCL 0.5 % OP SOLN
1.0000 [drp] | Freq: Once | OPHTHALMIC | Status: AC
  Administered 2017-07-24: 1 [drp] via OPHTHALMIC
  Filled 2017-07-24: qty 4

## 2017-07-24 MED ORDER — ERYTHROMYCIN 5 MG/GM OP OINT: TOPICAL_OINTMENT | OPHTHALMIC | 0 refills | Status: DC

## 2017-07-24 MED ORDER — FLUORESCEIN SODIUM 1 MG OP STRP
1.0000 | ORAL_STRIP | Freq: Once | OPHTHALMIC | Status: AC
  Administered 2017-07-24: 1 via OPHTHALMIC
  Filled 2017-07-24: qty 1

## 2017-07-24 NOTE — ED Triage Notes (Signed)
Patient here from home with complaints of right eye pain and irritation. States that something flew in her eye yesterday and she is unable to get it out. Irritation noted.

## 2017-07-24 NOTE — ED Provider Notes (Signed)
[facility] DEPT Provider Note   CSN: [id number] Arrival date & time: 07/24/17  1026     History   Chief Complaint Chief Complaint  Patient presents with  . Eye Pain    HPI Madison Collins is a 67 y.o. female.  HPI  Patient is a 67 year old female with history of GERD, hypertension who presents the emergency department today complaining of right eye pain and irritation that began yesterday.  States she was driving with her windows down yesterday afternoon when something flew into her eye.  She states that she flushed her eye with water after this occurred but still feels a foreign body sensation in her eye.  She reports some transient blurred vision that clears with blinking.   Denies fever, URI sxs, HA, N/V, loss of vision, flashers, floaters, diplopia, photophobia, trauma, rash, pain or painful EOM.     She denies any headache, neck pain, dizziness, lightheadedness, chest pain or shortness of breath.  States she did not take her blood pressure medications today.  Past Medical History:  Diagnosis Date  . Anxiety   . Anxiety and depression   . Arthritis   . Bulging disc 1990's   c4-5  . Cataract    right eye  . Depression   . GERD (gastroesophageal reflux disease)   . H/O: osteoarthritis   . Headache    migraines occasionally  . Hepatitis C    IA viral load low 02/09    BX past mild fibrosis  . Hypertension   . Insomnia, unspecified   . Knee pain   . Post-menopause 2010    Patient Active Problem List   Diagnosis Date Noted  . Class 1 obesity due to excess calories without serious comorbidity with body mass index (BMI) of 30.0 to 30.9 in adult 08/05/2016  . Chronic pain syndrome 08/05/2016  . Glucose intolerance 08/05/2016  . Arthritis of knee, right 12/23/2013  . Hepatitis C virus infection without hepatic coma 05/19/2011  . Hypertension 05/19/2011  . History of migraine headaches 05/19/2011  . Insomnia 05/19/2011  . Chronic  knee pain 05/19/2011  . Depression with anxiety 05/19/2011  . Cervical disc syndrome 05/19/2011  . Gastroesophageal reflux 05/19/2011    Past Surgical History:  Procedure Laterality Date  . BREAST BIOPSY Left   . BUNIONECTOMY Right 1983  . COLONOSCOPY  10/22/13  . EYE SURGERY Right 2010   hole in macula  . KNEE SURGERY Right     x4  . ORIF ELBOW FRACTURE Right 04/08/2015   Procedure: OPEN REDUCTION INTERNAL FIXATION (ORIF) ELBOW/OLECRANON FRACTURE;  Surgeon: Milly Jakob, MD;  Location: [facility];  Service: Orthopedics;  Laterality: Right;  . ORIF RADIAL FRACTURE Right 04/08/2015   Procedure: OPEN REDUCTION INTERNAL FIXATION (ORIF) RADIAL HEAD ;  Surgeon: Milly Jakob, MD;  Location: [facility];  Service: Orthopedics;  Laterality: Right;  . OVARIAN CYST REMOVAL    . TOTAL KNEE ARTHROPLASTY Right 12/24/2013   Procedure: RIGHT TOTAL KNEE ARTHROPLASTY;  Surgeon: Kerin Salen, MD;  Location: [facility];  Service: Orthopedics;  Laterality: Right;  . TUBAL LIGATION  1987     OB History   None      Home Medications    Prior to Admission medications   Medication Sig Start Date End Date Taking? Authorizing Provider  citalopram (CELEXA) 40 MG tablet Take 1 tablet (40 mg total) by mouth every morning. 06/15/17   Wardell Honour, MD  erythromycin ophthalmic ointment Place a 1/2 inch  ribbon of ointment into the lower eyelid four ties daily for 5 days. 07/24/17   Emalea Mix S, PA-C  fluticasone (FLONASE) 50 MCG/ACT nasal spray Place 2 sprays into both nostrils daily. 06/15/17   Wardell Honour, MD  gabapentin (NEURONTIN) 300 MG capsule Take 1 capsule (300 mg total) by mouth 3 (three) times daily. 06/15/17   Wardell Honour, MD  HYDROcodone-acetaminophen [facility]) 10-325 MG tablet Take 1 tablet by mouth every 6 (six) hours as needed. 06/15/17   Wardell Honour, MD  losartan-hydrochlorothiazide (HYZAAR) 100-12.5 MG tablet Take 1 tablet by mouth daily. 06/15/17   Wardell Honour, MD  lovastatin (MEVACOR) 20  MG tablet Take 1 tablet (20 mg total) by mouth at bedtime. 06/15/17   Wardell Honour, MD  meloxicam (MOBIC) 15 MG tablet Take 1 tablet (15 mg total) by mouth daily. 06/15/17 02/22/18  Wardell Honour, MD  omeprazole (PRILOSEC) 20 MG capsule Take 1 capsule (20 mg total) by mouth every morning. 06/15/17   Wardell Honour, MD  SUMAtriptan (IMITREX) 100 MG tablet Take 1 tablet (100 mg total) by mouth once for 1 dose. May repeat in 2 hours if headache persists or recurs. 06/15/17 06/15/17  Wardell Honour, MD    Family History Family History  Problem Relation Age of Onset  . Cancer Mother        Lung with brain mets  . Diabetes Mother   . Stroke Father 56  . Diabetes Father   . Colon cancer Father 36       colorectal/anal cancer  . Hypertension Brother   . Bipolar disorder Daughter   . Bipolar disorder Son   . Diabetes Maternal Grandfather   . Heart disease Maternal Grandfather   . Emphysema Paternal Grandmother   . Cancer Paternal Grandfather        stomach  . Breast cancer Maternal Aunt     Social History Social History   Tobacco Use  . Smoking status: Never Smoker  . Smokeless tobacco: Never Used  Substance Use Topics  . Alcohol use: Yes    Comment: a glass of wine/rarely  . Drug use: No     Allergies   Latex and Adhesive [tape]   Review of Systems Review of Systems  Constitutional: Negative for fever.  HENT: Negative for congestion and sneezing.   Eyes: Positive for redness. Negative for photophobia and visual disturbance.       Eye irritation, clear tearing  Respiratory: Negative for shortness of breath.   Cardiovascular: Negative for chest pain.  Gastrointestinal: Negative for abdominal pain.  Genitourinary: Negative for pelvic pain.  Musculoskeletal: Negative for back pain.  Neurological: Negative for dizziness, weakness, light-headedness, numbness and headaches.     Physical Exam Updated Vital Signs BP (!) 164/76 (BP Location: Left Arm)   Pulse 77   Temp  98.6 F (37 C) (Oral)   Resp 17   SpO2 100%   Physical Exam  Constitutional: She is oriented to person, place, and time. She appears well-developed and well-nourished. No distress.  HENT:  Head: Normocephalic and atraumatic.  Right Ear: External ear normal.  Left Ear: External ear normal.  Mouth/Throat: Oropharynx is clear and moist.  Bilateral TMs within normal limits.  Eyes: Pupils are equal, round, and reactive to light. Conjunctivae and EOM are normal. Right eye exhibits no discharge. Left eye exhibits no discharge.  Conjunctive is mildly injected on the right eye.  Slit-lamp exam completed and no foreign body noted.  Lid was everted no foreign body noted.  Fluorescein stain revealed no uptake.  Tonometry completed.  25 mmHg on the left, and 26 mmHg on the right.  Neck: Normal range of motion. Neck supple.  Cardiovascular: Normal rate.  Pulmonary/Chest: Effort normal and breath sounds normal.  Musculoskeletal: Normal range of motion.  Neurological: She is alert and oriented to person, place, and time. No cranial nerve deficit.  Skin: Skin is warm and dry.  Psychiatric: She has a normal mood and affect.  Nursing note and vitals reviewed.  ED Treatments / Results  Labs (all labs ordered are listed, but only abnormal results are displayed) Labs Reviewed - No data to display  EKG None  Radiology No results found.  Procedures Procedures (including critical care time)  Medications Ordered in ED Medications  tetracaine (PONTOCAINE) 0.5 % ophthalmic solution 1 drop (1 drop Both Eyes Given 07/24/17 1200)  fluorescein ophthalmic strip 1 strip (1 strip Both Eyes Given 07/24/17 1200)     Initial Impression / Assessment and Plan / ED Course  I have reviewed the triage vital signs and the nursing notes.  Pertinent labs & imaging results that were available during my care of the patient were reviewed by me and considered in my medical decision making (see chart for details).  Final  Clinical Impressions(s) / ED Diagnoses   Final diagnoses:  Acute right eye pain   67 year old female presenting with foreign body sensation to her eye that began yesterday after a bug flew in her eye.  Vital signs stable other than mild elevation in blood pressure which she attributes to not taking her blood pressure medications today.  No other symptoms to suggest hypertensive urgency/emergency at this time.  No foreign body noted on exam.  Slit-lamp exam with no corneal abrasion or fluorescein uptake.  Bilateral eye pressures slightly elevated bilaterally but equal.  Patient was advised to follow-up with her ophthalmologist about this tomorrow and she agrees to do so.  Will give erythromycin ointment to prevent secondary infection from eye irritation.  Advised follow-up with Ophtho this week for re-eval and to return to the ER if any new or worsening symptoms develop.  All questions answered and patient understands plan agrees to return immediately to ED.  ED Discharge Orders        Ordered    erythromycin ophthalmic ointment     07/24/17 [address] Nalee Lightle S, PA-C 07/24/17 2123    Daleen Bo, MD 07/25/17 [phone]

## 2017-07-24 NOTE — Discharge Instructions (Signed)
You were given an antibiotic ointment for your eye which you need to apply 4 times daily for the next 5 days.  Please follow-up with your eye doctor later this week for reevaluation of your elevated eye pressures and eye irritation.  Please return to the ER for any new or worsening symptoms including headaches, changes in vision, light sensitivity, eye pain please take your blood pressure medication when you go home.

## 2017-07-24 NOTE — ED Notes (Signed)
Bed: WTR6 Expected date:  Expected time:  Means of arrival:  Comments: 

## 2017-07-28 ENCOUNTER — Other Ambulatory Visit: Payer: Self-pay

## 2017-07-28 NOTE — Telephone Encounter (Signed)
Copied from [facility] [id number]. Topic: General - Other >> Jul 28, 2017 10:03 AM Oneta Rack wrote:  Relation to pt: self  Call back number:[phone] Pharmacy: [facility], [place] [phone] (Phone) [phone] (Fax)  Reason for call:  Patient requesting HYDROcodone-acetaminophen ([place]) 10-325 MG tablet refill, patient aware please allow 72 hour turn around time, patient states she was under the impression when she was seen 06/15/17 for her physical, PCP would supply refill, please advise >> Jul 28, 2017 10:05 AM Oneta Rack wrote:  Relation to pt: self  Call back number:[phone] Pharmacy: [facility], [place] [phone] (Phone) [phone] (Fax)  Reason for call:  Patient requesting HYDROcodone-acetaminophen ([place]) 10-325 MG tablet refill, patient aware please allow 72 hour turn around time, patient states she was under the impression when she was seen 06/15/17 for her physical, PCP would supply refill, please advise

## 2017-07-28 NOTE — Telephone Encounter (Signed)
Patient is requesting a refill of the following medications: Requested Prescriptions   Pending Prescriptions Disp Refills  . HYDROcodone-acetaminophen (NORCO) 10-325 MG tablet 100 tablet 0    Sig: Take 1 tablet by mouth every 6 (six) hours as needed.    Date of patient request: 07/28/17 Last office visit: 06/20/17 Date of last refill: 06/20/2017 Last refill amount: #100 tabs, no refills       Follow up time period per chart: Return in about 3 months (around 09/14/2017) for recheck.

## 2017-07-29 MED ORDER — HYDROCODONE-ACETAMINOPHEN 10-325 MG PO TABS: 1.0000 | ORAL_TABLET | Freq: Four times a day (QID) | ORAL | 0 refills | Status: DC | PRN

## 2017-08-24 ENCOUNTER — Telehealth: Payer: Self-pay | Admitting: Family Medicine

## 2017-08-24 NOTE — Telephone Encounter (Signed)
Copied from [place] [id number]. Topic: General - Other >> Aug 24, 2017  9:10 AM Keene Breath wrote: Loma Sousa, from [facility] called to get approval to refill prescription for Hydrocodone for patient.  She is confused by the instructions on the label.  CB# [phone].

## 2017-08-25 NOTE — Telephone Encounter (Signed)
Spoke with pharmacist at [facility] [place] The instructions say not to refill for 30 days after rx date.  Pt was there yesterday to get refill - they wanted to know if we were going to stick to the 30 days before a refill I stated yes we were going to stick to the instructions as it was written.

## 2017-10-03 ENCOUNTER — Encounter: Payer: Self-pay | Admitting: Family Medicine

## 2017-10-03 ENCOUNTER — Ambulatory Visit: Payer: Self-pay | Admitting: Family Medicine

## 2017-10-03 ENCOUNTER — Other Ambulatory Visit: Payer: Self-pay

## 2017-10-03 VITALS — BP 152/79 | HR 71 | Temp 97.6°F | Resp 16 | Ht 60.02 in | Wt 166.6 lb

## 2017-10-03 DIAGNOSIS — G8929 Other chronic pain: Secondary | ICD-10-CM

## 2017-10-03 DIAGNOSIS — I10 Essential (primary) hypertension: Secondary | ICD-10-CM

## 2017-10-03 DIAGNOSIS — M5010 Cervical disc disorder with radiculopathy, unspecified cervical region: Secondary | ICD-10-CM

## 2017-10-03 DIAGNOSIS — M25561 Pain in right knee: Secondary | ICD-10-CM

## 2017-10-03 DIAGNOSIS — Z5181 Encounter for therapeutic drug level monitoring: Secondary | ICD-10-CM

## 2017-10-03 MED ORDER — LOSARTAN POTASSIUM-HCTZ 100-25 MG PO TABS: 1.0000 | ORAL_TABLET | Freq: Every day | ORAL | 1 refills | Status: DC

## 2017-10-03 MED ORDER — HYDROCODONE-ACETAMINOPHEN 10-325 MG PO TABS: 1.0000 | ORAL_TABLET | Freq: Four times a day (QID) | ORAL | 0 refills | Status: DC | PRN

## 2017-10-03 MED ORDER — GABAPENTIN 400 MG PO CAPS: 400.0000 mg | ORAL_CAPSULE | Freq: Three times a day (TID) | ORAL | 3 refills | Status: DC

## 2017-10-03 NOTE — Patient Instructions (Signed)
     IF you received an x-ray today, you will receive an invoice from [facility]. Please contact [facility] at [phone] with questions or concerns regarding your invoice.   IF you received labwork today, you will receive an invoice from [facility]. Please contact [facility] at [phone] with questions or concerns regarding your invoice.   Our billing staff will not be able to assist you with questions regarding bills from these companies.  You will be contacted with the lab results as soon as they are available. The fastest way to get your results is to activate your My Chart account. Instructions are located on the last page of this paperwork. If you have not heard from us regarding the results in 2 weeks, please contact this office.     

## 2017-10-03 NOTE — Progress Notes (Signed)
8/12/20194:03 PM  Madison Collins 1950-03-16, 67 y.o. female [id number]  Chief Complaint  Patient presents with  . Medication Refill    norco.  Transfer of care-former [facility] patient, 3 month f/u for norco    HPI:   Patient is a 67 y.o. female with past medical history significant for HTN, GERD, chronic pain multiple sites on opiate therapy, prediabetes and depression with anxiety who presents today to establish care  Chronic pain - currently having worse issues with cervical radiculopathy. Saw ortho, has bulging discs, causing numbness/tingling of both arms, worse when she drives - which she does a lot of, lost insurance, unable to do PT. Pain not well controlled. Uses braces, has done injections. Other areas of pain are right knee - s/p TKA, hands. Takes at baseline TID norco, but with recent worsening of cervical pain and radiculopathy has been taking QID. Having to really make sure she does not run short at the end of the month. Also takes meloxicam and gabapentin.  She reports depression and anxiety well controlled on celexa. Gabapentin also helps.  Otherwise taking BP and HLP meds as prescribed Denies any side effects   Labs done April 2019 Prediabetes A1c 6.1 LDL 110 Normal crt  Last UDS 02/2017  UDS done: 09/2017 [place] CSR reviewed: today CSA signed: 09/2017   Fall Risk  10/03/2017 06/15/2017 03/02/2017 11/02/2016 07/30/2016  Falls in the past year? No No No No No  Number falls in past yr: - - - - -  Comment - - - - -  Injury with Fall? - - - - -  Risk Factor Category  - - - - -  Risk for fall due to : - - - - -  Follow up - - - - -     Depression screen [facility] 2/9 10/03/2017 06/15/2017 03/02/2017  Decreased Interest 0 0 0  Down, Depressed, Hopeless 0 0 0  PHQ - 2 Score 0 0 0  Altered sleeping - - -  Tired, decreased energy - - -  Change in appetite - - -  Feeling bad or failure about yourself  - - -  Trouble concentrating - - -  Moving slowly or fidgety/restless - - -    Suicidal thoughts - - -  PHQ-9 Score - - -    Allergies  Allergen Reactions  . Latex Other (See Comments)    "Latex tape used during surgery - breaks my skin out"  . Adhesive [Tape] Rash    "per pt - surgical tape, also causes itching    Prior to Admission medications   Medication Sig Start Date End Date Taking? Authorizing Provider  citalopram (CELEXA) 40 MG tablet Take 1 tablet (40 mg total) by mouth every morning. 06/15/17  Yes Wardell Honour, MD  erythromycin ophthalmic ointment Place a 1/2 inch ribbon of ointment into the lower eyelid four ties daily for 5 days. 07/24/17  Yes Couture, Cortni S, PA-C  fluticasone (FLONASE) 50 MCG/ACT nasal spray Place 2 sprays into both nostrils daily. 06/15/17  Yes Wardell Honour, MD  gabapentin (NEURONTIN) 300 MG capsule Take 1 capsule (300 mg total) by mouth 3 (three) times daily. 06/15/17  Yes Wardell Honour, MD  HYDROcodone-acetaminophen [facility]) 10-325 MG tablet Take 1 tablet by mouth every 6 (six) hours as needed. 07/29/17  Yes Wardell Honour, MD  losartan-hydrochlorothiazide (HYZAAR) 100-12.5 MG tablet Take 1 tablet by mouth daily. 06/15/17  Yes Wardell Honour, MD  lovastatin (MEVACOR) 20  MG tablet Take 1 tablet (20 mg total) by mouth at bedtime. 06/15/17  Yes Wardell Honour, MD  meloxicam (MOBIC) 15 MG tablet Take 1 tablet (15 mg total) by mouth daily. 06/15/17 02/22/18 Yes Wardell Honour, MD  omeprazole (PRILOSEC) 20 MG capsule Take 1 capsule (20 mg total) by mouth every morning. 06/15/17  Yes Wardell Honour, MD  SUMAtriptan (IMITREX) 100 MG tablet Take 1 tablet (100 mg total) by mouth once for 1 dose. May repeat in 2 hours if headache persists or recurs. 06/15/17 06/15/17  Wardell Honour, MD    Past Medical History:  Diagnosis Date  . Anxiety   . Anxiety and depression   . Arthritis   . Bulging disc 1990's   c4-5  . Cataract    right eye  . Depression   . GERD (gastroesophageal reflux disease)   . H/O: osteoarthritis   . Headache     migraines occasionally  . Hepatitis C    IA viral load low 02/09    BX past mild fibrosis  . Hypertension   . Insomnia, unspecified   . Knee pain   . Post-menopause 2010    Past Surgical History:  Procedure Laterality Date  . BREAST BIOPSY Left   . BUNIONECTOMY Right 1983  . COLONOSCOPY  10/22/13  . EYE SURGERY Right 2010   hole in macula  . KNEE SURGERY Right     x4  . ORIF ELBOW FRACTURE Right 04/08/2015   Procedure: OPEN REDUCTION INTERNAL FIXATION (ORIF) ELBOW/OLECRANON FRACTURE;  Surgeon: Milly Jakob, MD;  Location: [facility];  Service: Orthopedics;  Laterality: Right;  . ORIF RADIAL FRACTURE Right 04/08/2015   Procedure: OPEN REDUCTION INTERNAL FIXATION (ORIF) RADIAL HEAD ;  Surgeon: Milly Jakob, MD;  Location: [facility];  Service: Orthopedics;  Laterality: Right;  . OVARIAN CYST REMOVAL    . TOTAL KNEE ARTHROPLASTY Right 12/24/2013   Procedure: RIGHT TOTAL KNEE ARTHROPLASTY;  Surgeon: Kerin Salen, MD;  Location: [facility];  Service: Orthopedics;  Laterality: Right;  . TUBAL LIGATION  1987    Social History   Tobacco Use  . Smoking status: Never Smoker  . Smokeless tobacco: Never Used  Substance Use Topics  . Alcohol use: Yes    Comment: a glass of wine/rarely    Family History  Problem Relation Age of Onset  . Cancer Mother        Lung with brain mets  . Diabetes Mother   . Stroke Father 75  . Diabetes Father   . Colon cancer Father 60       colorectal/anal cancer  . Hypertension Brother   . Bipolar disorder Daughter   . Bipolar disorder Son   . Diabetes Maternal Grandfather   . Heart disease Maternal Grandfather   . Emphysema Paternal Grandmother   . Cancer Paternal Grandfather        stomach  . Breast cancer Maternal Aunt     Review of Systems  Constitutional: Negative for chills and fever.  Respiratory: Negative for cough and shortness of breath.   Cardiovascular: Negative for chest pain, palpitations and leg swelling.  Gastrointestinal: Negative for  abdominal pain, nausea and vomiting.  Musculoskeletal: Positive for joint pain and neck pain.  Neurological: Positive for tingling and sensory change.     OBJECTIVE:  Blood pressure (!) 152/79, pulse 71, temperature 97.6 F (36.4 C), temperature source Oral, resp. rate 16, height 5' 0.02" (1.525 m), weight 166 lb 9.6 oz (75.6 kg), SpO2  97 %. Body mass index is 32.52 kg/m.   BP Readings from Last 3 Encounters:  10/03/17 (!) 148/80  07/24/17 (!) 164/76  06/15/17 122/72    Physical Exam  Constitutional: She is oriented to person, place, and time. She appears well-developed and well-nourished.  HENT:  Head: Normocephalic and atraumatic.  Mouth/Throat: Oropharynx is clear and moist. No oropharyngeal exudate.  Eyes: Pupils are equal, round, and reactive to light. EOM are normal. No scleral icterus.  Neck: Neck supple.  Cardiovascular: Normal rate, regular rhythm and normal heart sounds. Exam reveals no gallop and no friction rub.  No murmur heard. Pulmonary/Chest: Effort normal and breath sounds normal. She has no wheezes. She has no rales.  Musculoskeletal: She exhibits no edema.  Neurological: She is alert and oriented to person, place, and time.  Skin: Skin is warm and dry.  Psychiatric: She has a normal mood and affect.  Nursing note and vitals reviewed.    ASSESSMENT and PLAN  1. Chronic pain of right knee 2. Cervical disc syndrome 3. Encounter for therapeutic drug monitoring - ToxASSURE Select 13 (MW), Urine CSA completed today. [place] CSR reviewed today. Overall not well controlled, unable to access other treatment modalities at this time due to no insurance. Slight increase in # tabs from 100 to 110. Also increasing gabapentin to 400mg  TID. Reviewed meds r/se/b  4. Essential hypertension Uncontrolled. Increasing hctz to 25mg . Consider adding amlodipine.  - Care order/instruction:  Other orders - gabapentin (NEURONTIN) 400 MG capsule; Take 1 capsule (400 mg total) by  mouth 3 (three) times daily. - HYDROcodone-acetaminophen ([place]CO) 10-325 MG tablet; Take 1 tablet by mouth every 6 (six) hours as needed. - HYDROcodone-acetaminophen (NORCO) 10-325 MG tablet; Take 1 tablet by mouth every 6 (six) hours as needed. - HYDROcodone-acetaminophen ([place]CO) 10-325 MG tablet; Take 1 tablet by mouth every 6 (six) hours as needed. Dx chronic knee pain - losartan-hydrochlorothiazide (HYZAAR) 100-25 MG tablet; Take 1 tablet by mouth daily.  Return in about 3 months (around 01/03/2018).    Rutherford Guys, MD Primary [facility] at [facility], [place] [address] Ph.  [phone] Fax [phone]

## 2017-10-07 LAB — TOXASSURE SELECT 13 (MW), URINE

## 2018-01-02 ENCOUNTER — Ambulatory Visit: Payer: Self-pay | Admitting: Family Medicine

## 2018-01-06 ENCOUNTER — Ambulatory Visit (INDEPENDENT_AMBULATORY_CARE_PROVIDER_SITE_OTHER): Payer: PPO | Admitting: Family Medicine

## 2018-01-06 ENCOUNTER — Encounter: Payer: Self-pay | Admitting: Family Medicine

## 2018-01-06 VITALS — BP 153/70 | HR 77 | Temp 97.6°F | Resp 14 | Ht 60.0 in | Wt 173.6 lb

## 2018-01-06 DIAGNOSIS — I10 Essential (primary) hypertension: Secondary | ICD-10-CM | POA: Diagnosis not present

## 2018-01-06 DIAGNOSIS — E7800 Pure hypercholesterolemia, unspecified: Secondary | ICD-10-CM | POA: Diagnosis not present

## 2018-01-06 DIAGNOSIS — Z5181 Encounter for therapeutic drug level monitoring: Secondary | ICD-10-CM

## 2018-01-06 DIAGNOSIS — Z23 Encounter for immunization: Secondary | ICD-10-CM | POA: Diagnosis not present

## 2018-01-06 DIAGNOSIS — E7439 Other disorders of intestinal carbohydrate absorption: Secondary | ICD-10-CM | POA: Diagnosis not present

## 2018-01-06 DIAGNOSIS — G894 Chronic pain syndrome: Secondary | ICD-10-CM

## 2018-01-06 MED ORDER — MELOXICAM 15 MG PO TABS: 15.0000 mg | ORAL_TABLET | Freq: Every day | ORAL | 1 refills | Status: DC

## 2018-01-06 MED ORDER — LOSARTAN POTASSIUM 100 MG PO TABS: 100.0000 mg | ORAL_TABLET | Freq: Every day | ORAL | 3 refills | Status: DC

## 2018-01-06 MED ORDER — CITALOPRAM HYDROBROMIDE 40 MG PO TABS: 40.0000 mg | ORAL_TABLET | Freq: Every morning | ORAL | 1 refills | Status: DC

## 2018-01-06 MED ORDER — CHLORTHALIDONE 25 MG PO TABS: 25.0000 mg | ORAL_TABLET | Freq: Every day | ORAL | 1 refills | Status: DC

## 2018-01-06 MED ORDER — HYDROCODONE-ACETAMINOPHEN 10-325 MG PO TABS: 1.0000 | ORAL_TABLET | Freq: Four times a day (QID) | ORAL | 0 refills | Status: DC | PRN

## 2018-01-06 NOTE — Progress Notes (Signed)
11/15/20194:49 PM  Madison Collins March 12, 1950, 67 y.o. female [id number]  Chief Complaint  Patient presents with  . Medication Refill    celexa, norco, meloxicam    HPI:   Patient is a 67 y.o. female with past medical history significant for HTN, HLP, GERD, chronic pain multiple sites on opiate therapy, prediabetes and depression with anxiety who presents today for routine followup  Last several months have been very stressful but overall feels depression and anxiety are doing well Has not been following diet Has gained weight Increased gabapentin helping with pain and anxiety  Taking meds as prescribed Overall chronic medical conditions are doing ok  Lab Results  Component Value Date   TSH 0.833 06/15/2017    Lab Results  Component Value Date   HGBA1C 6.1 (H) 06/15/2017   HGBA1C 6.3 (H) 11/02/2016   HGBA1C 6.4 (H) 07/30/2016   Lab Results  Component Value Date   LDLCALC 110 (H) 06/15/2017   CREATININE 0.72 06/15/2017    UDS done: 09/2017 [place] CSR reviewed: today, appropriate CSA signed: 09/2017   Fall Risk  01/06/2018 10/03/2017 06/15/2017 03/02/2017 11/02/2016  Falls in the past year? 0 No No No No  Number falls in past yr: - - - - -  Comment - - - - -  Injury with Fall? - - - - -  Risk Factor Category  - - - - -  Risk for fall due to : - - - - -  Follow up - - - - -     Depression screen [facility] 2/9 01/06/2018 10/03/2017 06/15/2017  Decreased Interest 0 0 0  Down, Depressed, Hopeless 0 0 0  PHQ - 2 Score 0 0 0  Altered sleeping - - -  Tired, decreased energy - - -  Change in appetite - - -  Feeling bad or failure about yourself  - - -  Trouble concentrating - - -  Moving slowly or fidgety/restless - - -  Suicidal thoughts - - -  PHQ-9 Score - - -    Allergies  Allergen Reactions  . Latex Other (See Comments)    "Latex tape used during surgery - breaks my skin out"  . Adhesive [Tape] Rash    "per pt - surgical tape, also causes itching    Prior to  Admission medications   Medication Sig Start Date End Date Taking? Authorizing Provider  citalopram (CELEXA) 40 MG tablet Take 1 tablet (40 mg total) by mouth every morning. 06/15/17   Wardell Honour, MD  fluticasone [facility]) 50 MCG/ACT nasal spray Place 2 sprays into both nostrils daily. 06/15/17   Wardell Honour, MD  gabapentin (NEURONTIN) 400 MG capsule Take 1 capsule (400 mg total) by mouth 3 (three) times daily. 10/03/17   Rutherford Guys, MD  HYDROcodone-acetaminophen [facility]) 10-325 MG tablet Take 1 tablet by mouth every 6 (six) hours as needed. 10/03/17   Rutherford Guys, MD  HYDROcodone-acetaminophen [facility]) 10-325 MG tablet Take 1 tablet by mouth every 6 (six) hours as needed. 11/02/17   Rutherford Guys, MD  HYDROcodone-acetaminophen [facility]) 10-325 MG tablet Take 1 tablet by mouth every 6 (six) hours as needed. Dx chronic knee pain 12/02/17   Rutherford Guys, MD  losartan-hydrochlorothiazide [facility]) 100-25 MG tablet Take 1 tablet by mouth daily. 10/03/17   Rutherford Guys, MD  lovastatin (MEVACOR) 20 MG tablet Take 1 tablet (20 mg total) by mouth at bedtime. 06/15/17   Wardell Honour, MD  meloxicam (MOBIC) 15 MG tablet Take 1 tablet (15 mg total) by mouth daily. 06/15/17 02/22/18  Wardell Honour, MD  omeprazole (PRILOSEC) 20 MG capsule Take 1 capsule (20 mg total) by mouth every morning. 06/15/17   Wardell Honour, MD  SUMAtriptan (IMITREX) 100 MG tablet Take 1 tablet (100 mg total) by mouth once for 1 dose. May repeat in 2 hours if headache persists or recurs. 06/15/17 06/15/17  Wardell Honour, MD    Past Medical History:  Diagnosis Date  . Anxiety   . Anxiety and depression   . Arthritis   . Bulging disc 1990's   c4-5  . Cataract    right eye  . Depression   . GERD (gastroesophageal reflux disease)   . H/O: osteoarthritis   . Headache    migraines occasionally  . Hepatitis C    IA viral load low 02/09    BX past mild fibrosis  . Hypertension   . Insomnia, unspecified   .  Knee pain   . Post-menopause 2010    Past Surgical History:  Procedure Laterality Date  . BREAST BIOPSY Left   . BUNIONECTOMY Right 1983  . COLONOSCOPY  10/22/13  . EYE SURGERY Right 2010   hole in macula  . KNEE SURGERY Right     x4  . ORIF ELBOW FRACTURE Right 04/08/2015   Procedure: OPEN REDUCTION INTERNAL FIXATION (ORIF) ELBOW/OLECRANON FRACTURE;  Surgeon: Milly Jakob, MD;  Location: [place];  Service: Orthopedics;  Laterality: Right;  . ORIF RADIAL FRACTURE Right 04/08/2015   Procedure: OPEN REDUCTION INTERNAL FIXATION (ORIF) RADIAL HEAD ;  Surgeon: Milly Jakob, MD;  Location: [place];  Service: Orthopedics;  Laterality: Right;  . OVARIAN CYST REMOVAL    . TOTAL KNEE ARTHROPLASTY Right 12/24/2013   Procedure: RIGHT TOTAL KNEE ARTHROPLASTY;  Surgeon: Kerin Salen, MD;  Location: [place];  Service: Orthopedics;  Laterality: Right;  . TUBAL LIGATION  1987    Social History   Tobacco Use  . Smoking status: Never Smoker  . Smokeless tobacco: Never Used  Substance Use Topics  . Alcohol use: Yes    Comment: a glass of wine/rarely    Family History  Problem Relation Age of Onset  . Cancer Mother        Lung with brain mets  . Diabetes Mother   . Stroke Father 82  . Diabetes Father   . Colon cancer Father 97       colorectal/anal cancer  . Hypertension Brother   . Bipolar disorder Daughter   . Bipolar disorder Son   . Diabetes Maternal Grandfather   . Heart disease Maternal Grandfather   . Emphysema Paternal Grandmother   . Cancer Paternal Grandfather        stomach  . Breast cancer Maternal Aunt     Review of Systems  Constitutional: Negative for chills and fever.  Respiratory: Negative for cough and shortness of breath.   Cardiovascular: Negative for chest pain, palpitations and leg swelling.  Gastrointestinal: Negative for abdominal pain, nausea and vomiting.  Musculoskeletal: Positive for back pain, joint pain and neck pain.  per hpi   OBJECTIVE:  Blood  pressure (!) 153/70, pulse 77, temperature 97.6 F (36.4 C), temperature source Oral, resp. rate 14, height 5' (1.524 m), weight 173 lb 9.6 oz (78.7 kg), SpO2 98 %. Body mass index is 33.9 kg/m.   Wt Readings from Last 3 Encounters:  01/06/18 173 lb 9.6 oz (78.7 kg)  10/03/17  166 lb 9.6 oz (75.6 kg)  06/15/17 164 lb (74.4 kg)    Physical Exam  Constitutional: She is oriented to person, place, and time. She appears well-developed and well-nourished.  HENT:  Head: Normocephalic and atraumatic.  Mouth/Throat: Mucous membranes are normal.  Eyes: Pupils are equal, round, and reactive to light. Conjunctivae and EOM are normal. No scleral icterus.  Neck: Neck supple.  Pulmonary/Chest: Effort normal.  Neurological: She is alert and oriented to person, place, and time.  Skin: Skin is warm and dry.  Psychiatric: She has a normal mood and affect.  Nursing note and vitals reviewed.   ASSESSMENT and PLAN  1. Glucose intolerance Checking labs today, medications will be adjusted as needed. Discussed importance of low carb diet, regular exercise and healthy weight.  - Hemoglobin A1c - TSH  2. Pure hypercholesterolemia Checking labs today, medications will be adjusted as needed.  - Lipid panel - Comprehensive metabolic panel - TSH  3. Essential hypertension Uncontrolled. Changing hctz to chlorthalidone, cont losartan - Care order/instruction:  4. Chronic pain syndrome 5. Encounter for therapeutic drug monitoring Stable. Continue current regime. meds refilled. PMP reviewed today, appropriate  Other orders - citalopram (CELEXA) 40 MG tablet; Take 1 tablet (40 mg total) by mouth every morning. - HYDROcodone-acetaminophen (NORCO) 10-325 MG tablet; Take 1 tablet by mouth every 6 (six) hours as needed. - HYDROcodone-acetaminophen (NORCO) 10-325 MG tablet; Take 1 tablet by mouth every 6 (six) hours as needed. - HYDROcodone-acetaminophen (NORCO) 10-325 MG tablet; Take 1 tablet by mouth every  6 (six) hours as needed. Dx chronic knee pain - meloxicam (MOBIC) 15 MG tablet; Take 1 tablet (15 mg total) by mouth daily. - Flu vaccine HIGH DOSE PF (Fluzone High dose) - losartan (COZAAR) 100 MG tablet; Take 1 tablet (100 mg total) by mouth daily. - chlorthalidone (HYGROTON) 25 MG tablet; Take 1 tablet (25 mg total) by mouth daily.    Return in about 3 months (around 04/08/2018) for chronic medical conditions.    Rutherford Guys, MD Primary Care at [place], [place] [address] Ph.  [phone] Fax [phone]

## 2018-01-06 NOTE — Patient Instructions (Signed)
° ° ° °  If you have lab work done today you will be contacted with your lab results within the next 2 weeks.  If you have not heard from us then please contact us. The fastest way to get your results is to register for My Chart. ° ° °IF you received an x-ray today, you will receive an invoice from [facility]. Please contact [facility] at [phone] with questions or concerns regarding your invoice.  ° °IF you received labwork today, you will receive an invoice from [facility]. Please contact [facility] at [phone] with questions or concerns regarding your invoice.  ° °Our billing staff will not be able to assist you with questions regarding bills from these companies. ° °You will be contacted with the lab results as soon as they are available. The fastest way to get your results is to activate your My Chart account. Instructions are located on the last page of this paperwork. If you have not heard from us regarding the results in 2 weeks, please contact this office. °  ° ° ° °

## 2018-01-07 LAB — COMPREHENSIVE METABOLIC PANEL
ALT: 15 IU/L (ref 0–32)
AST: 14 IU/L (ref 0–40)
Albumin/Globulin Ratio: 2 (ref 1.2–2.2)
Albumin: 4.8 g/dL (ref 3.6–4.8)
Alkaline Phosphatase: 79 IU/L (ref 39–117)
BUN/Creatinine Ratio: 32 — ABNORMAL HIGH (ref 12–28)
BUN: 23 mg/dL (ref 8–27)
Bilirubin Total: 0.2 mg/dL (ref 0.0–1.2)
CO2: 23 mmol/L (ref 20–29)
Calcium: 9.2 mg/dL (ref 8.7–10.3)
Chloride: 104 mmol/L (ref 96–106)
Creatinine, Ser: 0.72 mg/dL (ref 0.57–1.00)
GFR calc Af Amer: 102 mL/min/{1.73_m2} (ref >59)
GFR calc non Af Amer: 88 mL/min/{1.73_m2} (ref >59)
Globulin, Total: 2.4 g/dL (ref 1.5–4.5)
Glucose: 95 mg/dL (ref 65–99)
Potassium: 4.2 mmol/L (ref 3.5–5.2)
Sodium: 143 mmol/L (ref 134–144)
Total Protein: 7.2 g/dL (ref 6.0–8.5)

## 2018-01-07 LAB — LIPID PANEL
Chol/HDL Ratio: 3.3 ratio (ref 0.0–4.4)
Cholesterol, Total: 210 mg/dL — ABNORMAL HIGH (ref 100–199)
HDL: 63 mg/dL (ref >39)
LDL Calculated: 121 mg/dL — ABNORMAL HIGH (ref 0–99)
Triglycerides: 132 mg/dL (ref 0–149)
VLDL Cholesterol Cal: 26 mg/dL (ref 5–40)

## 2018-01-07 LAB — HEMOGLOBIN A1C
Est. average glucose Bld gHb Est-mCnc: 131 mg/dL
Hgb A1c MFr Bld: 6.2 % — ABNORMAL HIGH (ref 4.8–5.6)

## 2018-01-07 LAB — TSH: TSH: 1.84 u[IU]/mL (ref 0.450–4.500)

## 2018-03-31 ENCOUNTER — Telehealth: Payer: Self-pay | Admitting: Family Medicine

## 2018-03-31 NOTE — Telephone Encounter (Signed)
Made patient an appointment on 04/03/18 at 420pm--sent a mychart message

## 2018-04-03 ENCOUNTER — Ambulatory Visit (INDEPENDENT_AMBULATORY_CARE_PROVIDER_SITE_OTHER): Payer: PPO | Admitting: Family Medicine

## 2018-04-03 ENCOUNTER — Encounter: Payer: Self-pay | Admitting: Family Medicine

## 2018-04-03 ENCOUNTER — Other Ambulatory Visit: Payer: Self-pay

## 2018-04-03 VITALS — BP 131/70 | HR 80 | Temp 98.3°F | Ht 60.0 in | Wt 170.0 lb

## 2018-04-03 DIAGNOSIS — F4322 Adjustment disorder with anxiety: Secondary | ICD-10-CM

## 2018-04-03 DIAGNOSIS — G8929 Other chronic pain: Secondary | ICD-10-CM | POA: Diagnosis not present

## 2018-04-03 DIAGNOSIS — G894 Chronic pain syndrome: Secondary | ICD-10-CM

## 2018-04-03 DIAGNOSIS — M25561 Pain in right knee: Secondary | ICD-10-CM | POA: Diagnosis not present

## 2018-04-03 DIAGNOSIS — Z5181 Encounter for therapeutic drug level monitoring: Secondary | ICD-10-CM | POA: Diagnosis not present

## 2018-04-03 DIAGNOSIS — I10 Essential (primary) hypertension: Secondary | ICD-10-CM | POA: Diagnosis not present

## 2018-04-03 MED ORDER — MELOXICAM 15 MG PO TABS: 15.0000 mg | ORAL_TABLET | Freq: Every day | ORAL | 1 refills | Status: DC

## 2018-04-03 MED ORDER — GABAPENTIN 400 MG PO CAPS: 400.0000 mg | ORAL_CAPSULE | Freq: Three times a day (TID) | ORAL | 3 refills | Status: DC

## 2018-04-03 MED ORDER — HYDROCODONE-ACETAMINOPHEN 10-325 MG PO TABS: 1.0000 | ORAL_TABLET | Freq: Four times a day (QID) | ORAL | 0 refills | Status: DC | PRN

## 2018-04-03 NOTE — Patient Instructions (Signed)
° ° ° °  If you have lab work done today you will be contacted with your lab results within the next 2 weeks.  If you have not heard from us then please contact us. The fastest way to get your results is to register for My Chart. ° ° °IF you received an x-ray today, you will receive an invoice from [facility]. Please contact [facility] at [phone] with questions or concerns regarding your invoice.  ° °IF you received labwork today, you will receive an invoice from [facility]. Please contact [facility] at [phone] with questions or concerns regarding your invoice.  ° °Our billing staff will not be able to assist you with questions regarding bills from these companies. ° °You will be contacted with the lab results as soon as they are available. The fastest way to get your results is to activate your My Chart account. Instructions are located on the last page of this paperwork. If you have not heard from us regarding the results in 2 weeks, please contact this office. °  ° ° ° °

## 2018-04-03 NOTE — Progress Notes (Signed)
2/10/20205:27 PM  Madison Collins 28-Aug-1950, 68 y.o. female [id number]  Chief Complaint  Patient presents with  . Medication Refill    gabapentin, mobic, norco. Feeling very anxious with the recent change of adopting her 4 grandkids. Feelin gvery overwhelmed    HPI:   Patient is a 68 y.o. female with past medical history significant for HTN, HLP, GERD, chronic pain multiple sites on opiate therapy, prediabetes and depression with anxietywho presents today for routine followup  Last OV nov 2019 pmp reviewed Last refill Mar 08 2018  Has been given custody of her 4 grandchildren ages 1yo-13yo Custody will be permanent next week Finding herself overwhelmed but overall mood is stable Starts counseling for the 66 yo next week They are living in her partners house - doing ok   Lab Results  Component Value Date   HGBA1C 6.2 (H) 01/06/2018   HGBA1C 6.1 (H) 06/15/2017   HGBA1C 6.3 (H) 11/02/2016   Lab Results  Component Value Date   LDLCALC 121 (H) 01/06/2018   CREATININE 0.72 01/06/2018   Fall Risk  04/03/2018 01/06/2018 10/03/2017 06/15/2017 03/02/2017  Falls in the past year? 0 0 No No No  Number falls in past yr: 0 - - - -  Comment - - - - -  Injury with Fall? 0 - - - -  Risk Factor Category  - - - - -  Risk for fall due to : - - - - -  Follow up - - - - -     Depression screen [facility] 2/9 01/06/2018 10/03/2017 06/15/2017  Decreased Interest 0 0 0  Down, Depressed, Hopeless 0 0 0  PHQ - 2 Score 0 0 0  Altered sleeping - - -  Tired, decreased energy - - -  Change in appetite - - -  Feeling bad or failure about yourself  - - -  Trouble concentrating - - -  Moving slowly or fidgety/restless - - -  Suicidal thoughts - - -  PHQ-9 Score - - -    Allergies  Allergen Reactions  . Latex Other (See Comments)    "Latex tape used during surgery - breaks my skin out"  . Adhesive [Tape] Rash    "per pt - surgical tape, also causes itching    Prior to Admission medications    Medication Sig Start Date End Date Taking? Authorizing Provider  chlorthalidone (HYGROTON) 25 MG tablet Take 1 tablet (25 mg total) by mouth daily. 01/06/18  Yes Rutherford Guys, MD  citalopram (CELEXA) 40 MG tablet Take 1 tablet (40 mg total) by mouth every morning. 01/06/18  Yes Rutherford Guys, MD  fluticasone (FLONASE) 50 MCG/ACT nasal spray Place 2 sprays into both nostrils daily. 06/15/17  Yes Wardell Honour, MD  gabapentin (NEURONTIN) 400 MG capsule Take 1 capsule (400 mg total) by mouth 3 (three) times daily. 10/03/17  Yes Rutherford Guys, MD  HYDROcodone-acetaminophen [facility]) 10-325 MG tablet Take 1 tablet by mouth every 6 (six) hours as needed. 03/08/18  Yes Rutherford Guys, MD  HYDROcodone-acetaminophen [facility]) 10-325 MG tablet Take 1 tablet by mouth every 6 (six) hours as needed. 02/05/18  Yes Rutherford Guys, MD  HYDROcodone-acetaminophen [facility]) 10-325 MG tablet Take 1 tablet by mouth every 6 (six) hours as needed. Dx chronic knee pain 01/06/18  Yes Rutherford Guys, MD  losartan (COZAAR) 100 MG tablet Take 1 tablet (100 mg total) by mouth daily. 01/06/18  Yes Rutherford Guys, MD  lovastatin (MEVACOR) 20 MG tablet Take 1 tablet (20 mg total) by mouth at bedtime. 06/15/17  Yes Wardell Honour, MD  meloxicam (MOBIC) 15 MG tablet Take 1 tablet (15 mg total) by mouth daily. 01/06/18 09/15/18 Yes Rutherford Guys, MD  omeprazole (PRILOSEC) 20 MG capsule Take 1 capsule (20 mg total) by mouth every morning. 06/15/17  Yes Wardell Honour, MD  SUMAtriptan (IMITREX) 100 MG tablet Take 1 tablet (100 mg total) by mouth once for 1 dose. May repeat in 2 hours if headache persists or recurs. 06/15/17 06/15/17  Wardell Honour, MD    Past Medical History:  Diagnosis Date  . Anxiety   . Anxiety and depression   . Arthritis   . Bulging disc 1990's   c4-5  . Cataract    right eye  . Depression   . GERD (gastroesophageal reflux disease)   . H/O: osteoarthritis   . Headache    migraines  occasionally  . Hepatitis C    IA viral load low 02/09    BX past mild fibrosis  . Hypertension   . Insomnia, unspecified   . Knee pain   . Post-menopause 2010    Past Surgical History:  Procedure Laterality Date  . BREAST BIOPSY Left   . BUNIONECTOMY Right 1983  . COLONOSCOPY  10/22/13  . EYE SURGERY Right 2010   hole in macula  . KNEE SURGERY Right     x4  . ORIF ELBOW FRACTURE Right 04/08/2015   Procedure: OPEN REDUCTION INTERNAL FIXATION (ORIF) ELBOW/OLECRANON FRACTURE;  Surgeon: Milly Jakob, MD;  Location: [place];  Service: Orthopedics;  Laterality: Right;  . ORIF RADIAL FRACTURE Right 04/08/2015   Procedure: OPEN REDUCTION INTERNAL FIXATION (ORIF) RADIAL HEAD ;  Surgeon: Milly Jakob, MD;  Location: [place];  Service: Orthopedics;  Laterality: Right;  . OVARIAN CYST REMOVAL    . TOTAL KNEE ARTHROPLASTY Right 12/24/2013   Procedure: RIGHT TOTAL KNEE ARTHROPLASTY;  Surgeon: Kerin Salen, MD;  Location: [place];  Service: Orthopedics;  Laterality: Right;  . TUBAL LIGATION  1987    Social History   Tobacco Use  . Smoking status: Never Smoker  . Smokeless tobacco: Never Used  Substance Use Topics  . Alcohol use: Yes    Comment: a glass of wine/rarely    Family History  Problem Relation Age of Onset  . Cancer Mother        Lung with brain mets  . Diabetes Mother   . Stroke Father 75  . Diabetes Father   . Colon cancer Father 38       colorectal/anal cancer  . Hypertension Brother   . Bipolar disorder Daughter   . Bipolar disorder Son   . Diabetes Maternal Grandfather   . Heart disease Maternal Grandfather   . Emphysema Paternal Grandmother   . Cancer Paternal Grandfather        stomach  . Breast cancer Maternal Aunt     Review of Systems  Constitutional: Negative for chills and fever.  Respiratory: Negative for cough and shortness of breath.   Cardiovascular: Negative for chest pain, palpitations and leg swelling.  Gastrointestinal: Negative for abdominal  pain, nausea and vomiting.  Musculoskeletal: Positive for joint pain.   Per hpi  OBJECTIVE:  Blood pressure 131/70, pulse 80, temperature 98.3 F (36.8 C), temperature source Oral, height 5' (1.524 m), weight 170 lb (77.1 kg), SpO2 96 %. Body mass index is 33.2 kg/m.   Wt Readings from Last  3 Encounters:  04/03/18 170 lb (77.1 kg)  01/06/18 173 lb 9.6 oz (78.7 kg)  10/03/17 166 lb 9.6 oz (75.6 kg)    Physical Exam Vitals signs and nursing note reviewed.  Constitutional:      Appearance: She is well-developed.  HENT:     Head: Normocephalic and atraumatic.  Eyes:     General: No scleral icterus.    Conjunctiva/sclera: Conjunctivae normal.     Pupils: Pupils are equal, round, and reactive to light.  Neck:     Musculoskeletal: Neck supple.  Pulmonary:     Effort: Pulmonary effort is normal.  Skin:    General: Skin is warm and dry.  Neurological:     Mental Status: She is alert and oriented to person, place, and time.     ASSESSMENT and PLAN  1. Chronic pain syndrome Stable. Continue current regime. meds refilled for 90 days - ToxASSURE Select 13 (MW), Urine  2. Chronic pain of right knee  3. Essential hypertension Controlled. Continue current regime.   4. Adjustment disorder with anxious mood Overall doing ok. Has good support for CPS and justice department.   5. Encounter for therapeutic drug monitoring - ToxASSURE Select 13 (MW), Urine  Other orders - gabapentin (NEURONTIN) 400 MG capsule; Take 1 capsule (400 mg total) by mouth 3 (three) times daily. - HYDROcodone-acetaminophen (NORCO) 10-325 MG tablet; Take 1 tablet by mouth every 6 (six) hours as needed. DX: CHRONIC KNEE PAIN. - HYDROcodone-acetaminophen (NORCO) 10-325 MG tablet; Take 1 tablet by mouth every 6 (six) hours as needed. DX chronic knee pain - HYDROcodone-acetaminophen (NORCO) 10-325 MG tablet; Take 1 tablet by mouth every 6 (six) hours as needed. Dx chronic knee pain - meloxicam (MOBIC) 15 MG  tablet; Take 1 tablet (15 mg total) by mouth daily.  Return in about 3 months (around 07/02/2018).    Rutherford Guys, MD Primary Care at [place], [place] [address] Ph.  [phone] Fax [phone]

## 2018-04-08 LAB — TOXASSURE SELECT 13 (MW), URINE

## 2018-06-30 ENCOUNTER — Other Ambulatory Visit: Payer: Self-pay | Admitting: Family Medicine

## 2018-06-30 NOTE — Telephone Encounter (Signed)
Requested Prescriptions  Pending Prescriptions Disp Refills  . chlorthalidone (HYGROTON) 25 MG tablet [Pharmacy Med Name: CHLORTHALIDONE 25 MG TABLET] 30 tablet 0    Sig: TAKE ONE TABLET BY MOUTH DAILY     Cardiovascular: Diuretics - Thiazide Failed - 06/30/2018 10:25 AM      Failed - Valid encounter within last 6 months    Recent Outpatient Visits          2 months ago Chronic pain syndrome   [facility], Lilia Argue, MD   5 months ago Glucose intolerance   [facility], Lilia Argue, MD   9 months ago Chronic pain of right knee   [facility], Lilia Argue, MD   1 year ago Routine physical examination   Primary Care at [facility], Renette Butters, MD   1 year ago Acquired trigger finger of right ring finger   Primary Care at [facility], Renette Butters, MD      Future Appointments            In 5 days Rutherford Guys, MD Primary Care at [place], [place] in normal range and within 360 days    Calcium  Date Value Ref Range Status  01/06/2018 9.2 8.7 - 10.3 mg/dL Final         Passed - Cr in normal range and within 360 days    Creat  Date Value Ref Range Status  01/06/2016 0.61 0.50 - 0.99 mg/dL Final    Comment:      For patients > or = 65 years of age: The upper reference limit for Creatinine is approximately 13% higher for people identified as African-American.      Creatinine, Ser  Date Value Ref Range Status  01/06/2018 0.72 0.57 - 1.00 mg/dL Final         Passed - K in normal range and within 360 days    Potassium  Date Value Ref Range Status  01/06/2018 4.2 3.5 - 5.2 mmol/L Final         Passed - Na in normal range and within 360 days    Sodium  Date Value Ref Range Status  01/06/2018 143 134 - 144 mmol/L Final         Passed - Last BP in normal range    BP Readings from Last 1 Encounters:  04/03/18 131/70

## 2018-07-05 ENCOUNTER — Encounter: Payer: Self-pay | Admitting: Family Medicine

## 2018-07-05 ENCOUNTER — Other Ambulatory Visit: Payer: Self-pay

## 2018-07-05 ENCOUNTER — Telehealth (INDEPENDENT_AMBULATORY_CARE_PROVIDER_SITE_OTHER): Payer: PPO | Admitting: Family Medicine

## 2018-07-05 DIAGNOSIS — G894 Chronic pain syndrome: Secondary | ICD-10-CM | POA: Diagnosis not present

## 2018-07-05 DIAGNOSIS — M25561 Pain in right knee: Secondary | ICD-10-CM

## 2018-07-05 DIAGNOSIS — M5010 Cervical disc disorder with radiculopathy, unspecified cervical region: Secondary | ICD-10-CM | POA: Diagnosis not present

## 2018-07-05 DIAGNOSIS — F418 Other specified anxiety disorders: Secondary | ICD-10-CM

## 2018-07-05 DIAGNOSIS — E7800 Pure hypercholesterolemia, unspecified: Secondary | ICD-10-CM | POA: Diagnosis not present

## 2018-07-05 DIAGNOSIS — I10 Essential (primary) hypertension: Secondary | ICD-10-CM

## 2018-07-05 DIAGNOSIS — E7439 Other disorders of intestinal carbohydrate absorption: Secondary | ICD-10-CM

## 2018-07-05 DIAGNOSIS — G8929 Other chronic pain: Secondary | ICD-10-CM | POA: Diagnosis not present

## 2018-07-05 MED ORDER — HYDROCODONE-ACETAMINOPHEN 10-325 MG PO TABS: 1.0000 | ORAL_TABLET | Freq: Four times a day (QID) | ORAL | 0 refills | Status: DC | PRN

## 2018-07-05 MED ORDER — CITALOPRAM HYDROBROMIDE 40 MG PO TABS: 40.0000 mg | ORAL_TABLET | Freq: Every morning | ORAL | 1 refills | Status: DC

## 2018-07-05 MED ORDER — MELOXICAM 15 MG PO TABS: 15.0000 mg | ORAL_TABLET | Freq: Every day | ORAL | 1 refills | Status: DC

## 2018-07-05 MED ORDER — LOVASTATIN 20 MG PO TABS: 20.0000 mg | ORAL_TABLET | Freq: Every day | ORAL | 3 refills | Status: DC

## 2018-07-05 MED ORDER — CHLORTHALIDONE 25 MG PO TABS: 25.0000 mg | ORAL_TABLET | Freq: Every day | ORAL | 1 refills | Status: DC

## 2018-07-05 NOTE — Progress Notes (Signed)
Pt c/o medication refills of chlorthalidone, citalopram, hydrocodone, lovastatin, and meloxicam.

## 2018-07-05 NOTE — Progress Notes (Signed)
Virtual Visit Note  I connected with patient on 07/05/18 at 400pm by phone and verified that I am speaking with the correct person using two identifiers. Madison Collins is currently located at home and patient is currently with them during visit. The provider, Rutherford Guys, MD is located in their office at time of visit.  I discussed the limitations, risks, security and privacy concerns of performing an evaluation and management service by telephone and the availability of in person appointments. I also discussed with the patient that there may be a patient responsible charge related to this service. The patient expressed understanding and agreed to proceed.   CC: med refill chlorthalidone, citalopram, hydrocodone, lovastatin, meloxicam  HPI ? Patient is a 68 y.o. female with past medical history significant for HTN,HLP,GERD, chronic pain multiple sites on opiate therapy, prediabetes and depression with anxietywho presents today forroutine followup  Last OV feb 2020 No changes to regime  Overall doing well Has no acute concerns today Chronic pain, multiple joints, on vicodin, meloxicam and gabapentin Tolerating meds well, denies side effects Able to maintain QOL as she cares for her grandchildren UDS feb 2020 pmp reviewed today  Walks 2 miles daily Reports depression and anxiety are well controlled Does not check BP at home Reports some mild weight gain Denies any fever, chills, cough, SOB, chest pain, edema  BP Readings from Last 3 Encounters:  04/03/18 131/70  01/06/18 (!) 153/70  10/03/17 (!) 152/79   Lab Results  Component Value Date   HGBA1C 6.2 (H) 01/06/2018   HGBA1C 6.1 (H) 06/15/2017   HGBA1C 6.3 (H) 11/02/2016   Lab Results  Component Value Date   LDLCALC 121 (H) 01/06/2018   CREATININE 0.72 01/06/2018    Allergies  Allergen Reactions  . Latex Other (See Comments)    "Latex tape used during surgery - breaks my skin out"  . Adhesive [Tape]  Rash    "per pt - surgical tape, also causes itching    Prior to Admission medications   Medication Sig Start Date End Date Taking? Authorizing Provider  chlorthalidone (HYGROTON) 25 MG tablet TAKE ONE TABLET BY MOUTH DAILY 06/30/18  Yes Rutherford Guys, MD  citalopram (CELEXA) 40 MG tablet Take 1 tablet (40 mg total) by mouth every morning. 01/06/18  Yes Rutherford Guys, MD  fluticasone (FLONASE) 50 MCG/ACT nasal spray Place 2 sprays into both nostrils daily. 06/15/17  Yes Wardell Honour, MD  gabapentin (NEURONTIN) 400 MG capsule Take 1 capsule (400 mg total) by mouth 3 (three) times daily. 04/03/18  Yes Rutherford Guys, MD  HYDROcodone-acetaminophen [facility]) 10-325 MG tablet Take 1 tablet by mouth every 6 (six) hours as needed. DX: CHRONIC KNEE PAIN. 04/03/18  Yes Rutherford Guys, MD  HYDROcodone-acetaminophen [facility]) 10-325 MG tablet Take 1 tablet by mouth every 6 (six) hours as needed. DX chronic knee pain 04/03/18  Yes Rutherford Guys, MD  HYDROcodone-acetaminophen [facility]) 10-325 MG tablet Take 1 tablet by mouth every 6 (six) hours as needed. Dx chronic knee pain 04/03/18  Yes Rutherford Guys, MD  losartan (COZAAR) 100 MG tablet Take 1 tablet (100 mg total) by mouth daily. 01/06/18  Yes Rutherford Guys, MD  lovastatin (MEVACOR) 20 MG tablet Take 1 tablet (20 mg total) by mouth at bedtime. 06/15/17  Yes Wardell Honour, MD  meloxicam (MOBIC) 15 MG tablet Take 1 tablet (15 mg total) by mouth daily. 04/03/18  Yes Rutherford Guys, MD  omeprazole [facility])  20 MG capsule Take 1 capsule (20 mg total) by mouth every morning. 06/15/17  Yes Wardell Honour, MD  SUMAtriptan (IMITREX) 100 MG tablet Take 1 tablet (100 mg total) by mouth once for 1 dose. May repeat in 2 hours if headache persists or recurs. 06/15/17 06/15/17  Wardell Honour, MD    Past Medical History:  Diagnosis Date  . Anxiety   . Anxiety and depression   . Arthritis   . Bulging disc 1990's   c4-5  . Cataract    right eye  .  Depression   . GERD (gastroesophageal reflux disease)   . H/O: osteoarthritis   . Headache    migraines occasionally  . Hepatitis C    IA viral load low 02/09    BX past mild fibrosis  . Hypertension   . Insomnia, unspecified   . Knee pain   . Post-menopause 2010    Past Surgical History:  Procedure Laterality Date  . BREAST BIOPSY Left   . BUNIONECTOMY Right 1983  . COLONOSCOPY  10/22/13  . EYE SURGERY Right 2010   hole in macula  . KNEE SURGERY Right     x4  . ORIF ELBOW FRACTURE Right 04/08/2015   Procedure: OPEN REDUCTION INTERNAL FIXATION (ORIF) ELBOW/OLECRANON FRACTURE;  Surgeon: Milly Jakob, MD;  Location: [facility] [place];  Service: Orthopedics;  Laterality: Right;  . ORIF RADIAL FRACTURE Right 04/08/2015   Procedure: OPEN REDUCTION INTERNAL FIXATION (ORIF) RADIAL HEAD ;  Surgeon: Milly Jakob, MD;  Location: [place];  Service: Orthopedics;  Laterality: Right;  . OVARIAN CYST REMOVAL    . TOTAL KNEE ARTHROPLASTY Right 12/24/2013   Procedure: RIGHT TOTAL KNEE ARTHROPLASTY;  Surgeon: Kerin Salen, MD;  Location: [place];  Service: Orthopedics;  Laterality: Right;  . TUBAL LIGATION  1987    Social History   Tobacco Use  . Smoking status: Never Smoker  . Smokeless tobacco: Never Used  Substance Use Topics  . Alcohol use: Yes    Comment: a glass of wine/rarely    Family History  Problem Relation Age of Onset  . Cancer Mother        Lung with brain mets  . Diabetes Mother   . Stroke Father 23  . Diabetes Father   . Colon cancer Father 37       colorectal/anal cancer  . Hypertension Brother   . Bipolar disorder Daughter   . Bipolar disorder Son   . Diabetes Maternal Grandfather   . Heart disease Maternal Grandfather   . Emphysema Paternal Grandmother   . Cancer Paternal Grandfather        stomach  . Breast cancer Maternal Aunt     ROS Per hpi  Objective  Vitals as reported by the patient: none   ASSESSMENT and PLAN  1. Chronic pain syndrome 2. Cervical disc  syndrome 3. Chronic pain of right knee Stable. Continue current regime.   4. Essential hypertension Controlled. Continue current regime.  - CBC; Future  5. Pure hypercholesterolemia Checking labs, medications will be adjusted as needed.  - Lipid panel; Future - Comprehensive metabolic panel; Future  6. Glucose intolerance Discussed LFM. Checking a1c. Consider starting metformin if worse. - Hemoglobin A1c; Future  7. Depression with anxiety Controlled. Continue current regime.   Other orders - lovastatin (MEVACOR) 20 MG tablet; Take 1 tablet (20 mg total) by mouth at bedtime. - meloxicam (MOBIC) 15 MG tablet; Take 1 tablet (15 mg total) by mouth daily. - HYDROcodone-acetaminophen (NORCO)  10-325 MG tablet; Take 1 tablet by mouth every 6 (six) hours as needed. DX: CHRONIC KNEE PAIN. - HYDROcodone-acetaminophen (NORCO) 10-325 MG tablet; Take 1 tablet by mouth every 6 (six) hours as needed. DX chronic knee pain - HYDROcodone-acetaminophen (NORCO) 10-325 MG tablet; Take 1 tablet by mouth every 6 (six) hours as needed. Dx chronic knee pain - citalopram (CELEXA) 40 MG tablet; Take 1 tablet (40 mg total) by mouth every morning. - chlorthalidone (HYGROTON) 25 MG tablet; Take 1 tablet (25 mg total) by mouth daily.  FOLLOW-UP: fasting labs within 1 week, followup in 3 months   The above assessment and management plan was discussed with the patient. The patient verbalized understanding of and has agreed to the management plan. Patient is aware to call the clinic if symptoms persist or worsen. Patient is aware when to return to the clinic for a follow-up visit. Patient educated on when it is appropriate to go to the emergency department.    I provided 16 minutes of non-face-to-face time during this encounter.  Rutherford Guys, MD Primary Care at [facility], [place] [address] Ph.  [phone] Fax [phone]

## 2018-07-12 ENCOUNTER — Other Ambulatory Visit: Payer: Self-pay

## 2018-07-12 ENCOUNTER — Ambulatory Visit (INDEPENDENT_AMBULATORY_CARE_PROVIDER_SITE_OTHER): Payer: PPO | Admitting: Family Medicine

## 2018-07-12 DIAGNOSIS — E7800 Pure hypercholesterolemia, unspecified: Secondary | ICD-10-CM | POA: Diagnosis not present

## 2018-07-12 DIAGNOSIS — E7439 Other disorders of intestinal carbohydrate absorption: Secondary | ICD-10-CM | POA: Diagnosis not present

## 2018-07-12 DIAGNOSIS — I10 Essential (primary) hypertension: Secondary | ICD-10-CM

## 2018-07-13 LAB — CBC
Hematocrit: 40.4 % (ref 34.0–46.6)
Hemoglobin: 13.8 g/dL (ref 11.1–15.9)
MCH: 30.1 pg (ref 26.6–33.0)
MCHC: 34.2 g/dL (ref 31.5–35.7)
MCV: 88 fL (ref 79–97)
Platelets: 273 10*3/uL (ref 150–450)
RBC: 4.59 x10E6/uL (ref 3.77–5.28)
RDW: 13 % (ref 11.7–15.4)
WBC: 7.5 10*3/uL (ref 3.4–10.8)

## 2018-07-13 LAB — COMPREHENSIVE METABOLIC PANEL
ALT: 15 IU/L (ref 0–32)
AST: 16 IU/L (ref 0–40)
Albumin/Globulin Ratio: 1.7 (ref 1.2–2.2)
Albumin: 4.5 g/dL (ref 3.8–4.8)
Alkaline Phosphatase: 85 IU/L (ref 39–117)
BUN/Creatinine Ratio: 25 (ref 12–28)
BUN: 21 mg/dL (ref 8–27)
Bilirubin Total: 0.3 mg/dL (ref 0.0–1.2)
CO2: 23 mmol/L (ref 20–29)
Calcium: 9.7 mg/dL (ref 8.7–10.3)
Chloride: 102 mmol/L (ref 96–106)
Creatinine, Ser: 0.85 mg/dL (ref 0.57–1.00)
GFR calc Af Amer: 83 mL/min/{1.73_m2} (ref >59)
GFR calc non Af Amer: 72 mL/min/{1.73_m2} (ref >59)
Globulin, Total: 2.6 g/dL (ref 1.5–4.5)
Glucose: 141 mg/dL — ABNORMAL HIGH (ref 65–99)
Potassium: 4.6 mmol/L (ref 3.5–5.2)
Sodium: 141 mmol/L (ref 134–144)
Total Protein: 7.1 g/dL (ref 6.0–8.5)

## 2018-07-13 LAB — HEMOGLOBIN A1C
Est. average glucose Bld gHb Est-mCnc: 137 mg/dL
Hgb A1c MFr Bld: 6.4 % — ABNORMAL HIGH (ref 4.8–5.6)

## 2018-07-13 LAB — LIPID PANEL
Chol/HDL Ratio: 3.7 ratio (ref 0.0–4.4)
Cholesterol, Total: 208 mg/dL — ABNORMAL HIGH (ref 100–199)
HDL: 56 mg/dL (ref >39)
LDL Calculated: 117 mg/dL — ABNORMAL HIGH (ref 0–99)
Triglycerides: 173 mg/dL — ABNORMAL HIGH (ref 0–149)
VLDL Cholesterol Cal: 35 mg/dL (ref 5–40)

## 2018-07-19 MED ORDER — METFORMIN HCL ER 500 MG PO TB24: 500.0000 mg | ORAL_TABLET | Freq: Every day | ORAL | 1 refills | Status: DC

## 2018-07-26 ENCOUNTER — Other Ambulatory Visit: Payer: Self-pay

## 2018-07-26 ENCOUNTER — Telehealth: Payer: Self-pay | Admitting: Family Medicine

## 2018-07-26 DIAGNOSIS — K219 Gastro-esophageal reflux disease without esophagitis: Secondary | ICD-10-CM

## 2018-07-26 MED ORDER — OMEPRAZOLE 20 MG PO CPDR: 20.0000 mg | DELAYED_RELEASE_CAPSULE | Freq: Every morning | ORAL | 3 refills | Status: DC

## 2018-07-26 NOTE — Telephone Encounter (Unsigned)
Copied from [place] [id number]. Topic: Quick Communication - Rx Refill/Question >> Jul 26, 2018 11:55 AM Alanda Slim E wrote: Medication: omeprazole (PRILOSEC) 20 MG capsule   Has the patient contacted their pharmacy? Yes - request sent   Preferred Pharmacy (with phone number or street name): [place], [place] [phone] (Phone) [phone] (Fax)    Agent: Please be advised that RX refills may take up to 3 business days. We ask that you follow-up with your pharmacy.

## 2018-07-26 NOTE — Telephone Encounter (Signed)
Rx sent to pharmacy   

## 2018-08-10 ENCOUNTER — Other Ambulatory Visit: Payer: Self-pay | Admitting: Family Medicine

## 2018-08-24 NOTE — Progress Notes (Signed)
Nurse visit only

## 2018-09-04 ENCOUNTER — Other Ambulatory Visit: Payer: Self-pay | Admitting: Family Medicine

## 2018-09-04 DIAGNOSIS — Z1231 Encounter for screening mammogram for malignant neoplasm of breast: Secondary | ICD-10-CM

## 2018-09-12 ENCOUNTER — Ambulatory Visit: Payer: PPO | Admitting: Family Medicine

## 2018-09-23 ENCOUNTER — Encounter: Payer: Self-pay | Admitting: Internal Medicine

## 2018-10-05 ENCOUNTER — Encounter: Payer: Self-pay | Admitting: Family Medicine

## 2018-10-05 ENCOUNTER — Other Ambulatory Visit: Payer: Self-pay

## 2018-10-05 ENCOUNTER — Ambulatory Visit (INDEPENDENT_AMBULATORY_CARE_PROVIDER_SITE_OTHER): Payer: PPO | Admitting: Family Medicine

## 2018-10-05 VITALS — BP 120/74 | HR 73 | Temp 99.0°F | Ht 60.0 in | Wt 176.0 lb

## 2018-10-05 DIAGNOSIS — I10 Essential (primary) hypertension: Secondary | ICD-10-CM | POA: Diagnosis not present

## 2018-10-05 DIAGNOSIS — G894 Chronic pain syndrome: Secondary | ICD-10-CM

## 2018-10-05 DIAGNOSIS — L918 Other hypertrophic disorders of the skin: Secondary | ICD-10-CM | POA: Diagnosis not present

## 2018-10-05 DIAGNOSIS — E7800 Pure hypercholesterolemia, unspecified: Secondary | ICD-10-CM | POA: Insufficient documentation

## 2018-10-05 DIAGNOSIS — F418 Other specified anxiety disorders: Secondary | ICD-10-CM

## 2018-10-05 DIAGNOSIS — Z79899 Other long term (current) drug therapy: Secondary | ICD-10-CM

## 2018-10-05 DIAGNOSIS — R7303 Prediabetes: Secondary | ICD-10-CM | POA: Diagnosis not present

## 2018-10-05 DIAGNOSIS — E1169 Type 2 diabetes mellitus with other specified complication: Secondary | ICD-10-CM | POA: Insufficient documentation

## 2018-10-05 DIAGNOSIS — Z780 Asymptomatic menopausal state: Secondary | ICD-10-CM | POA: Diagnosis not present

## 2018-10-05 MED ORDER — GABAPENTIN 400 MG PO CAPS: 400.0000 mg | ORAL_CAPSULE | Freq: Three times a day (TID) | ORAL | 2 refills | Status: DC

## 2018-10-05 MED ORDER — HYDROCODONE-ACETAMINOPHEN 10-325 MG PO TABS: 1.0000 | ORAL_TABLET | Freq: Four times a day (QID) | ORAL | 0 refills | Status: DC | PRN

## 2018-10-05 MED ORDER — SUMATRIPTAN SUCCINATE 100 MG PO TABS: 100.0000 mg | ORAL_TABLET | Freq: Once | ORAL | 5 refills | Status: DC

## 2018-10-05 NOTE — Progress Notes (Signed)
8/13/20209:59 AM  Madison Collins 28-Sep-1950, 68 y.o., female [id number]  Chief Complaint  Patient presents with  . Hypertension  . Medication Refill    imitrex, gabapentin, hydrocodone    HPI:   Patient is a 68 y.o. female with past medical history significant for HTN,HLP,GERD, migraines, chronic pain multiple sites on opiate therapy, prediabetes and depression with anxietywho presents today forroutine followup  Last OV May 2020 - telemedicine Started metformin for a1c 6.4 - tolerating well  Overall she is doing well Takes her medications as prescribed Reports her chronic conditions are well controlled Gets migraines infrequently, needs refill of imitrex  She has gained weight since last visit Eats healthy, but has noticed her portions are getting larger She will start walking again once cooler  Requesting referral to derm to have multiple skin tags removed and overall skin check  Never smoked Does not take supplements, but eats lots of diary and dark greens  pmp reviewed today Last UDS feb 2020   Lab Results  Component Value Date   HGBA1C 6.4 (H) 07/12/2018   Lab Results  Component Value Date   CHOL 208 (H) 07/12/2018   HDL 56 07/12/2018   LDLCALC 117 (H) 07/12/2018   TRIG 173 (H) 07/12/2018   CHOLHDL 3.7 07/12/2018   Lab Results  Component Value Date   CREATININE 0.85 07/12/2018   BUN 21 07/12/2018   NA 141 07/12/2018   K 4.6 07/12/2018   CL 102 07/12/2018   CO2 23 07/12/2018    Depression screen PHQ 2/9 07/05/2018 01/06/2018 10/03/2017  Decreased Interest 0 0 0  Down, Depressed, Hopeless 0 0 0  PHQ - 2 Score 0 0 0  Altered sleeping - - -  Tired, decreased energy - - -  Change in appetite - - -  Feeling bad or failure about yourself  - - -  Trouble concentrating - - -  Moving slowly or fidgety/restless - - -  Suicidal thoughts - - -  PHQ-9 Score - - -    Fall Risk  07/05/2018 04/03/2018 01/06/2018 10/03/2017 06/15/2017  Falls in the  past year? 0 0 0 No No  Number falls in past yr: - 0 - - -  Comment - - - - -  Injury with Fall? - 0 - - -  Risk Factor Category  - - - - -  Risk for fall due to : - - - - -  Follow up - - - - -     Allergies  Allergen Reactions  . Latex Other (See Comments)    "Latex tape used during surgery - breaks my skin out"  . Adhesive [Tape] Rash    "per pt - surgical tape, also causes itching    Prior to Admission medications   Medication Sig Start Date End Date Taking? Authorizing Provider  chlorthalidone (HYGROTON) 25 MG tablet Take 1 tablet (25 mg total) by mouth daily. 07/05/18   Rutherford Guys, MD  citalopram (CELEXA) 40 MG tablet Take 1 tablet (40 mg total) by mouth every morning. 07/05/18   Rutherford Guys, MD  fluticasone (FLONASE) 50 MCG/ACT nasal spray Place 2 sprays into both nostrils daily. 06/15/17   Wardell Honour, MD  gabapentin (NEURONTIN) 400 MG capsule TAKE ONE CAPSULE BY MOUTH THREE TIMES A DAY 08/10/18   Rutherford Guys, MD  HYDROcodone-acetaminophen [facility]) 10-325 MG tablet Take 1 tablet by mouth every 6 (six) hours as needed. DX: CHRONIC KNEE PAIN. 07/05/18  Rutherford Guys, MD  HYDROcodone-acetaminophen [facility]) 10-325 MG tablet Take 1 tablet by mouth every 6 (six) hours as needed. DX chronic knee pain 07/05/18   Rutherford Guys, MD  HYDROcodone-acetaminophen [facility]) 10-325 MG tablet Take 1 tablet by mouth every 6 (six) hours as needed. Dx chronic knee pain 07/05/18   Rutherford Guys, MD  losartan (COZAAR) 100 MG tablet Take 1 tablet (100 mg total) by mouth daily. 01/06/18   Rutherford Guys, MD  lovastatin (MEVACOR) 20 MG tablet Take 1 tablet (20 mg total) by mouth at bedtime. 07/05/18   Rutherford Guys, MD  meloxicam (MOBIC) 15 MG tablet Take 1 tablet (15 mg total) by mouth daily. 07/05/18   Rutherford Guys, MD  metFORMIN (GLUCOPHAGE-XR) 500 MG 24 hr tablet Take 1 tablet (500 mg total) by mouth daily with breakfast. 07/19/18   Rutherford Guys, MD  omeprazole (PRILOSEC)  20 MG capsule Take 1 capsule (20 mg total) by mouth every morning. 07/26/18   Rutherford Guys, MD  SUMAtriptan (IMITREX) 100 MG tablet Take 1 tablet (100 mg total) by mouth once for 1 dose. May repeat in 2 hours if headache persists or recurs. 06/15/17 06/15/17  Wardell Honour, MD    Past Medical History:  Diagnosis Date  . Anxiety   . Anxiety and depression   . Arthritis   . Bulging disc 1990's   c4-5  . Cataract    right eye  . Depression   . GERD (gastroesophageal reflux disease)   . H/O: osteoarthritis   . Headache    migraines occasionally  . Hepatitis C    IA viral load low 02/09    BX past mild fibrosis  . Hypertension   . Insomnia, unspecified   . Knee pain   . Post-menopause 2010    Past Surgical History:  Procedure Laterality Date  . BREAST BIOPSY Left   . BUNIONECTOMY Right 1983  . COLONOSCOPY  10/22/13  . EYE SURGERY Right 2010   hole in macula  . KNEE SURGERY Right     x4  . ORIF ELBOW FRACTURE Right 04/08/2015   Procedure: OPEN REDUCTION INTERNAL FIXATION (ORIF) ELBOW/OLECRANON FRACTURE;  Surgeon: Milly Jakob, MD;  Location: [place];  Service: Orthopedics;  Laterality: Right;  . ORIF RADIAL FRACTURE Right 04/08/2015   Procedure: OPEN REDUCTION INTERNAL FIXATION (ORIF) RADIAL HEAD ;  Surgeon: Milly Jakob, MD;  Location: [place];  Service: Orthopedics;  Laterality: Right;  . OVARIAN CYST REMOVAL    . TOTAL KNEE ARTHROPLASTY Right 12/24/2013   Procedure: RIGHT TOTAL KNEE ARTHROPLASTY;  Surgeon: Kerin Salen, MD;  Location: [place];  Service: Orthopedics;  Laterality: Right;  . TUBAL LIGATION  1987    Social History   Tobacco Use  . Smoking status: Never Smoker  . Smokeless tobacco: Never Used  Substance Use Topics  . Alcohol use: Yes    Comment: a glass of wine/rarely    Family History  Problem Relation Age of Onset  . Cancer Mother        Lung with brain mets  . Diabetes Mother   . Stroke Father 49  . Diabetes Father   . Colon cancer Father 93        colorectal/anal cancer  . Hypertension Brother   . Bipolar disorder Daughter   . Bipolar disorder Son   . Diabetes Maternal Grandfather   . Heart disease Maternal Grandfather   . Emphysema Paternal Grandmother   . Cancer  Paternal Grandfather        stomach  . Breast cancer Maternal Aunt     Review of Systems  Constitutional: Negative for chills and fever.  Respiratory: Negative for cough and shortness of breath.   Cardiovascular: Negative for chest pain, palpitations and leg swelling.  Gastrointestinal: Negative for abdominal pain, nausea and vomiting.     OBJECTIVE:  Today's Vitals   10/05/18 0947  BP: 120/74  Pulse: 73  Temp: 99 F (37.2 C)  TempSrc: Oral  SpO2: 96%  Weight: 176 lb (79.8 kg)  Height: 5' (1.524 m)   Body mass index is 34.37 kg/m.  Wt Readings from Last 3 Encounters:  10/05/18 176 lb (79.8 kg)  04/03/18 170 lb (77.1 kg)  01/06/18 173 lb 9.6 oz (78.7 kg)    Physical Exam Vitals signs and nursing note reviewed.  Constitutional:      Appearance: She is well-developed.  HENT:     Head: Normocephalic and atraumatic.     Mouth/Throat:     Pharynx: No oropharyngeal exudate.  Eyes:     General: No scleral icterus.    Conjunctiva/sclera: Conjunctivae normal.     Pupils: Pupils are equal, round, and reactive to light.  Neck:     Musculoskeletal: Neck supple.  Cardiovascular:     Rate and Rhythm: Normal rate and regular rhythm.     Heart sounds: Normal heart sounds. No murmur. No friction rub. No gallop.   Pulmonary:     Effort: Pulmonary effort is normal.     Breath sounds: Normal breath sounds. No wheezing or rales.  Skin:    General: Skin is warm and dry.  Neurological:     Mental Status: She is alert and oriented to person, place, and time.     No results found for this or any previous visit (from the past 24 hour(s)).  No results found.   ASSESSMENT and PLAN  1. Prediabetes Tolerating metformin well. Discussed LFM, weight loss.  Recheck a1c at next visit.  2. Essential hypertension Controlled. Continue current regime.   3. Pure hypercholesterolemia Stable. Discussed weight loss  4. Chronic pain syndrome Controlled. Continue current regime. pmp and uds done today.  5. Medication management - ToxASSURE Select 13 (MW), Urine  6. Postmenopausal estrogen deficiency Non smoker, discussed health bone LFM - DG Bone Density; Future  7. Depression with anxiety Controlled. Continue current regime.   8. Skin tag - Ambulatory referral to Dermatology  Other orders - HYDROcodone-acetaminophen (NORCO) 10-325 MG tablet; Take 1 tablet by mouth every 6 (six) hours as needed. DX: CHRONIC KNEE PAIN. - HYDROcodone-acetaminophen (NORCO) 10-325 MG tablet; Take 1 tablet by mouth every 6 (six) hours as needed. DX chronic knee pain - HYDROcodone-acetaminophen (NORCO) 10-325 MG tablet; Take 1 tablet by mouth every 6 (six) hours as needed. Dx chronic knee pain - gabapentin (NEURONTIN) 400 MG capsule; Take 1 capsule (400 mg total) by mouth 3 (three) times daily. - SUMAtriptan (IMITREX) 100 MG tablet; Take 1 tablet (100 mg total) by mouth once for 1 dose. May repeat in 2 hours if headache persists or recurs.  Return in about 3 months (around 01/05/2019).    Rutherford Guys, MD Primary Care at [facility] [place], [place] [address] Ph.  [phone] Fax [phone]

## 2018-10-05 NOTE — Patient Instructions (Signed)

## 2018-10-07 LAB — TOXASSURE SELECT 13 (MW), URINE

## 2018-10-19 ENCOUNTER — Ambulatory Visit: Payer: BLUE CROSS/BLUE SHIELD

## 2018-12-18 DIAGNOSIS — L918 Other hypertrophic disorders of the skin: Secondary | ICD-10-CM | POA: Diagnosis not present

## 2018-12-18 DIAGNOSIS — L812 Freckles: Secondary | ICD-10-CM | POA: Diagnosis not present

## 2018-12-18 DIAGNOSIS — L821 Other seborrheic keratosis: Secondary | ICD-10-CM | POA: Diagnosis not present

## 2018-12-18 DIAGNOSIS — D225 Melanocytic nevi of trunk: Secondary | ICD-10-CM | POA: Diagnosis not present

## 2018-12-20 ENCOUNTER — Other Ambulatory Visit: Payer: BLUE CROSS/BLUE SHIELD

## 2018-12-22 ENCOUNTER — Other Ambulatory Visit: Payer: Self-pay

## 2018-12-22 ENCOUNTER — Ambulatory Visit
Admission: RE | Admit: 2018-12-22 | Discharge: 2018-12-22 | Disposition: A | Discharge status: Home / Self Care | Payer: PPO | Source: Ambulatory Visit | Attending: Family Medicine | Admitting: Family Medicine

## 2018-12-22 DIAGNOSIS — Z780 Asymptomatic menopausal state: Secondary | ICD-10-CM

## 2018-12-22 DIAGNOSIS — M8589 Other specified disorders of bone density and structure, multiple sites: Secondary | ICD-10-CM | POA: Diagnosis not present

## 2019-01-05 ENCOUNTER — Encounter: Payer: Self-pay | Admitting: Family Medicine

## 2019-01-05 ENCOUNTER — Other Ambulatory Visit: Payer: Self-pay

## 2019-01-05 ENCOUNTER — Ambulatory Visit (INDEPENDENT_AMBULATORY_CARE_PROVIDER_SITE_OTHER): Payer: PPO | Admitting: Family Medicine

## 2019-01-05 VITALS — BP 108/60 | HR 80 | Temp 98.2°F | Ht 60.0 in | Wt 174.2 lb

## 2019-01-05 DIAGNOSIS — E7800 Pure hypercholesterolemia, unspecified: Secondary | ICD-10-CM | POA: Diagnosis not present

## 2019-01-05 DIAGNOSIS — I10 Essential (primary) hypertension: Secondary | ICD-10-CM | POA: Diagnosis not present

## 2019-01-05 DIAGNOSIS — F418 Other specified anxiety disorders: Secondary | ICD-10-CM | POA: Diagnosis not present

## 2019-01-05 DIAGNOSIS — M8580 Other specified disorders of bone density and structure, unspecified site: Secondary | ICD-10-CM | POA: Diagnosis not present

## 2019-01-05 DIAGNOSIS — E034 Atrophy of thyroid (acquired): Secondary | ICD-10-CM | POA: Insufficient documentation

## 2019-01-05 DIAGNOSIS — R7303 Prediabetes: Secondary | ICD-10-CM | POA: Diagnosis not present

## 2019-01-05 DIAGNOSIS — M7061 Trochanteric bursitis, right hip: Secondary | ICD-10-CM | POA: Diagnosis not present

## 2019-01-05 DIAGNOSIS — G894 Chronic pain syndrome: Secondary | ICD-10-CM | POA: Diagnosis not present

## 2019-01-05 MED ORDER — METFORMIN HCL ER 500 MG PO TB24: 500.0000 mg | ORAL_TABLET | Freq: Every day | ORAL | 1 refills | Status: DC

## 2019-01-05 MED ORDER — HYDROCODONE-ACETAMINOPHEN 10-325 MG PO TABS: 1.0000 | ORAL_TABLET | Freq: Four times a day (QID) | ORAL | 0 refills | Status: DC | PRN

## 2019-01-05 MED ORDER — LOSARTAN POTASSIUM 100 MG PO TABS: 100.0000 mg | ORAL_TABLET | Freq: Every day | ORAL | 3 refills | Status: DC

## 2019-01-05 MED ORDER — GABAPENTIN 400 MG PO CAPS: 400.0000 mg | ORAL_CAPSULE | Freq: Three times a day (TID) | ORAL | 2 refills | Status: DC

## 2019-01-05 MED ORDER — CITALOPRAM HYDROBROMIDE 40 MG PO TABS: 40.0000 mg | ORAL_TABLET | Freq: Every morning | ORAL | 1 refills | Status: DC

## 2019-01-05 MED ORDER — CHLORTHALIDONE 25 MG PO TABS: 25.0000 mg | ORAL_TABLET | Freq: Every day | ORAL | 1 refills | Status: DC

## 2019-01-05 MED ORDER — DICLOFENAC SODIUM 1 % EX GEL: 4.0000 g | Freq: Four times a day (QID) | CUTANEOUS | 5 refills | Status: DC

## 2019-01-05 NOTE — Progress Notes (Signed)
11/13/202010:24 AM  Madison Collins [birth year], 68 y.o., female [id number]  Chief Complaint  Patient presents with  . Follow-up  . Medication Refill    HPI:   Patient is a 68 y.o. female with past medical history significant for HTN,HLP,GERD, migraines, chronic pain multiple sites on opiate therapy, prediabetes and depression with anxietywho presents today forroutine followup  Last OV Aug 2020 UDS done then- appropriate No med changes done pmp appropriate She will be moving into a bigger house She will be restarting her part time job again  We discussed dexa Has not started taking vit d OTC yet She does get sufficient calcium thru diet She usually walks daily  Starting to have right hip pain when she lies on it Had corrective bracing/casting for that hip when she was young Also Right leg shorter than left leg   Depression screen [facility] 2/9 01/05/2019 07/05/2018 01/06/2018  Decreased Interest 0 0 0  Down, Depressed, Hopeless 0 0 0  PHQ - 2 Score 0 0 0  Altered sleeping - - -  Tired, decreased energy - - -  Change in appetite - - -  Feeling bad or failure about yourself  - - -  Trouble concentrating - - -  Moving slowly or fidgety/restless - - -  Suicidal thoughts - - -  PHQ-9 Score - - -    Fall Risk  01/05/2019 07/05/2018 04/03/2018 01/06/2018 10/03/2017  Falls in the past year? 0 0 0 0 No  Number falls in past yr: 0 - 0 - -  Comment - - - - -  Injury with Fall? 0 - 0 - -  Risk Factor Category  - - - - -  Risk for fall due to : - - - - -  Follow up - - - - -     Allergies  Allergen Reactions  . Latex Other (See Comments)    "Latex tape used during surgery - breaks my skin out"  . Adhesive [Tape] Rash    "per pt - surgical tape, also causes itching    Prior to Admission medications   Medication Sig Start Date End Date Taking? Authorizing Provider  chlorthalidone (HYGROTON) 25 MG tablet Take 1 tablet (25 mg total) by mouth daily. 07/05/18  Yes  Rutherford Guys, MD  citalopram (CELEXA) 40 MG tablet Take 1 tablet (40 mg total) by mouth every morning. 07/05/18  Yes Rutherford Guys, MD  fluticasone [facility]) 50 MCG/ACT nasal spray Place 2 sprays into both nostrils daily. 06/15/17  Yes Wardell Honour, MD  gabapentin (NEURONTIN) 400 MG capsule Take 1 capsule (400 mg total) by mouth 3 (three) times daily. 10/05/18  Yes Rutherford Guys, MD  HYDROcodone-acetaminophen [facility]) 10-325 MG tablet Take 1 tablet by mouth every 6 (six) hours as needed. Dx chronic knee pain 10/05/18  Yes Rutherford Guys, MD  losartan (COZAAR) 100 MG tablet Take 1 tablet (100 mg total) by mouth daily. 01/06/18  Yes Rutherford Guys, MD  lovastatin (MEVACOR) 20 MG tablet Take 1 tablet (20 mg total) by mouth at bedtime. 07/05/18  Yes Rutherford Guys, MD  meloxicam (MOBIC) 15 MG tablet Take 1 tablet (15 mg total) by mouth daily. 07/05/18  Yes Rutherford Guys, MD  metFORMIN (GLUCOPHAGE-XR) 500 MG 24 hr tablet Take 1 tablet (500 mg total) by mouth daily with breakfast. 07/19/18  Yes Rutherford Guys, MD  omeprazole (PRILOSEC) 20 MG capsule Take 1 capsule (20 mg total) by mouth  every morning. 07/26/18  Yes Rutherford Guys, MD  SUMAtriptan (IMITREX) 100 MG tablet Take 1 tablet (100 mg total) by mouth once for 1 dose. May repeat in 2 hours if headache persists or recurs. 10/05/18 10/05/18  Rutherford Guys, MD    Past Medical History:  Diagnosis Date  . Anxiety   . Anxiety and depression   . Arthritis   . Bulging disc 1990's   c4-5  . Cataract    right eye  . Depression   . GERD (gastroesophageal reflux disease)   . H/O: osteoarthritis   . Headache    migraines occasionally  . Hepatitis C    IA viral load low 02/09    BX past mild fibrosis  . Hypertension   . Insomnia, unspecified   . Knee pain   . Post-menopause 2010    Past Surgical History:  Procedure Laterality Date  . BREAST BIOPSY Left   . BUNIONECTOMY Right 1983  . COLONOSCOPY  10/22/13  . EYE SURGERY Right  2010   hole in macula  . KNEE SURGERY Right     x4  . ORIF ELBOW FRACTURE Right 04/08/2015   Procedure: OPEN REDUCTION INTERNAL FIXATION (ORIF) ELBOW/OLECRANON FRACTURE;  Surgeon: Milly Jakob, MD;  Location: [place];  Service: Orthopedics;  Laterality: Right;  . ORIF RADIAL FRACTURE Right 04/08/2015   Procedure: OPEN REDUCTION INTERNAL FIXATION (ORIF) RADIAL HEAD ;  Surgeon: Milly Jakob, MD;  Location: [place];  Service: Orthopedics;  Laterality: Right;  . OVARIAN CYST REMOVAL    . TOTAL KNEE ARTHROPLASTY Right 12/24/2013   Procedure: RIGHT TOTAL KNEE ARTHROPLASTY;  Surgeon: Kerin Salen, MD;  Location: [place];  Service: Orthopedics;  Laterality: Right;  . TUBAL LIGATION  1987    Social History   Tobacco Use  . Smoking status: Never Smoker  . Smokeless tobacco: Never Used  Substance Use Topics  . Alcohol use: Yes    Comment: a glass of wine/rarely    Family History  Problem Relation Age of Onset  . Cancer Mother        Lung with brain mets  . Diabetes Mother   . Stroke Father 43  . Diabetes Father   . Colon cancer Father 45       colorectal/anal cancer  . Hypertension Brother   . Bipolar disorder Daughter   . Bipolar disorder Son   . Diabetes Maternal Grandfather   . Heart disease Maternal Grandfather   . Emphysema Paternal Grandmother   . Cancer Paternal Grandfather        stomach  . Breast cancer Maternal Aunt     Review of Systems  Constitutional: Negative for chills and fever.  Respiratory: Negative for cough and shortness of breath.   Cardiovascular: Negative for chest pain, palpitations and leg swelling.  Gastrointestinal: Negative for abdominal pain, nausea and vomiting.  per hpi   OBJECTIVE:  Today's Vitals   01/05/19 1018  BP: 108/60  Pulse: 80  Temp: 98.2 F (36.8 C)  SpO2: 100%  Weight: 174 lb 3.2 oz (79 kg)  Height: 5' (1.524 m)   Body mass index is 34.02 kg/m.   Physical Exam Vitals signs and nursing note reviewed.  Constitutional:       Appearance: She is well-developed.  HENT:     Head: Normocephalic and atraumatic.     Mouth/Throat:     Pharynx: No oropharyngeal exudate.  Eyes:     General: No scleral icterus.    Conjunctiva/sclera: Conjunctivae  normal.     Pupils: Pupils are equal, round, and reactive to light.  Neck:     Musculoskeletal: Neck supple.  Cardiovascular:     Rate and Rhythm: Normal rate and regular rhythm.     Heart sounds: Normal heart sounds. No murmur. No friction rub. No gallop.   Pulmonary:     Effort: Pulmonary effort is normal.     Breath sounds: Normal breath sounds. No wheezing or rales.  Musculoskeletal:     Right hip: She exhibits tenderness (GT bursa). She exhibits normal range of motion, normal strength and no swelling.     Right lower leg: No edema.     Left lower leg: No edema.  Skin:    General: Skin is warm and dry.  Neurological:     Mental Status: She is alert and oriented to person, place, and time.     No results found for this or any previous visit (from the past 24 hour(s)).  No results found.   ASSESSMENT and PLAN  1. Prediabetes Checking labs today, medications will be adjusted as needed.  - Hemoglobin A1c  2. Pure hypercholesterolemia Checking labs today, medications will be adjusted as needed.  - Lipid panel  3. Essential hypertension Controlled. Continue current regime.  - Comprehensive metabolic panel  4. Chronic pain syndrome Stable. pmp reviewed. Cont current mgt  5. Depression with anxiety Overall stable despite increase stressors/anxiety, hoping moving a larger house will help. Discussed/provided handout re mindfulness strategies. Consider augmenting with buspar if needed  6. Hypothyroidism due to acquired atrophy of thyroid Checking labs today, medications will be adjusted as needed.  - TSH  7. Osteopenia after menopause Discussed dexa results. Reviewed LFM recommendations. R/o vit d deficiency - Vitamin D, 25-hydroxy  8. Trochanteric  bursitis of right hip Discussed supportive measures, new meds r/se/b and RTC precautions. Patient educational handout given. Consider referral to sports medicine.  Other orders - chlorthalidone (HYGROTON) 25 MG tablet; Take 1 tablet (25 mg total) by mouth daily. - citalopram (CELEXA) 40 MG tablet; Take 1 tablet (40 mg total) by mouth every morning. - gabapentin (NEURONTIN) 400 MG capsule; Take 1 capsule (400 mg total) by mouth 3 (three) times daily. - HYDROcodone-acetaminophen (NORCO) 10-325 MG tablet; Take 1 tablet by mouth every 6 (six) hours as needed. DX: CHRONIC KNEE PAIN. - losartan (COZAAR) 100 MG tablet; Take 1 tablet (100 mg total) by mouth daily. - metFORMIN (GLUCOPHAGE-XR) 500 MG 24 hr tablet; Take 1 tablet (500 mg total) by mouth daily with breakfast. - diclofenac Sodium (VOLTAREN) 1 % GEL; Apply 4 g topically 4 (four) times daily.  Return in about 3 months (around 04/07/2019).    Rutherford Guys, MD [facility] [place], [place] [address] Ph.  [phone] Fax [phone]

## 2019-01-05 NOTE — Patient Instructions (Signed)
 Hip Bursitis Rehab Ask your health care provider which exercises are safe for you. Do exercises exactly as told by your health care provider and adjust them as directed. It is normal to feel mild stretching, pulling, tightness, or discomfort as you do these exercises. Stop right away if you feel sudden pain or your pain gets worse. Do not begin these exercises until told by your health care provider. Stretching exercise This exercise warms up your muscles and joints and improves the movement and flexibility of your hip. This exercise also helps to relieve pain and stiffness. Iliotibial band stretch An iliotibial band is a strong band of muscle tissue that runs from the outer side of your hip to the outer side of your thigh and knee. 1. Lie on your side with your left / right leg in the top position. 2. Bend your left / right knee and grab your ankle. Stretch out your bottom arm to help you balance. 3. Slowly bring your knee back so your thigh is behind your body. 4. Slowly lower your knee toward the floor until you feel a gentle stretch on the outside of your left / right thigh. If you do not feel a stretch and your knee will not fall farther, place the heel of your other foot on top of your knee and pull your knee down toward the floor with your foot. 5. Hold this position for __________ seconds. 6. Slowly return to the starting position. Repeat __________ times. Complete this exercise __________ times a day. Strengthening exercises These exercises build strength and endurance in your hip and pelvis. Endurance is the ability to use your muscles for a long time, even after they get tired. Bridge This exercise strengthens the muscles that move your thigh backward (hip extensors). 1. Lie on your back on a firm surface with your knees bent and your feet flat on the floor. 2. Tighten your buttocks muscles and lift your buttocks off the floor until your trunk is level with your thighs. ? Do not  arch your back. ? You should feel the muscles working in your buttocks and the back of your thighs. If you do not feel these muscles, slide your feet 1-2 inches (2.5-5 cm) farther away from your buttocks. ? If this exercise is too easy, try doing it with your arms crossed over your chest. 3. Hold this position for __________ seconds. 4. Slowly lower your hips to the starting position. 5. Let your muscles relax completely after each repetition. Repeat __________ times. Complete this exercise __________ times a day. Squats This exercise strengthens the muscles in front of your thigh and knee (quadriceps). 1. Stand in front of a table, with your feet and knees pointing straight ahead. You may rest your hands on the table for balance but not for support. 2. Slowly bend your knees and lower your hips like you are going to sit in a chair. ? Keep your weight over your heels, not over your toes. ? Keep your lower legs upright so they are parallel with the table legs. ? Do not let your hips go lower than your knees. ? Do not bend lower than told by your health care provider. ? If your hip pain increases, do not bend as low. 3. Hold the squat position for __________ seconds. 4. Slowly push with your legs to return to standing. Do not use your hands to pull yourself to standing. Repeat __________ times. Complete this exercise __________ times a day. Hip hike 1.   Stand sideways on a bottom step. Stand on your left / right leg with your other foot unsupported next to the step. You can hold on to the railing or wall for balance if needed. 2. Keep your knees straight and your torso square. Then lift your left / right hip up toward the ceiling. 3. Hold this position for __________ seconds. 4. Slowly let your left / right hip lower toward the floor, past the starting position. Your foot should get closer to the floor. Do not lean or bend your knees. Repeat __________ times. Complete this exercise __________  times a day. Single leg stand 1. Without shoes, stand near a railing or in a doorway. You may hold on to the railing or door frame as needed for balance. 2. Squeeze your left / right buttock muscles, then lift up your other foot. ? Do not let your left / right hip push out to the side. ? It is helpful to stand in front of a mirror for this exercise so you can watch your hip. 3. Hold this position for __________ seconds. Repeat __________ times. Complete this exercise __________ times a day. This information is not intended to replace advice given to you by your health care provider. Make sure you discuss any questions you have with your health care provider. Document Released: 03/18/2004 Document Revised: 06/05/2018 Document Reviewed: 06/05/2018 Elsevier Patient Education  2020 [employer] Inc.  

## 2019-01-06 LAB — VITAMIN D 25 HYDROXY (VIT D DEFICIENCY, FRACTURES): Vit D, 25-Hydroxy: 26.5 ng/mL — ABNORMAL LOW (ref 30.0–100.0)

## 2019-01-06 LAB — HEMOGLOBIN A1C
Est. average glucose Bld gHb Est-mCnc: 140 mg/dL
Hgb A1c MFr Bld: 6.5 % — ABNORMAL HIGH (ref 4.8–5.6)

## 2019-01-06 LAB — COMPREHENSIVE METABOLIC PANEL
ALT: 10 IU/L (ref 0–32)
AST: 14 IU/L (ref 0–40)
Albumin/Globulin Ratio: 1.7 (ref 1.2–2.2)
Albumin: 4.7 g/dL (ref 3.8–4.8)
Alkaline Phosphatase: 89 IU/L (ref 39–117)
BUN/Creatinine Ratio: 22 (ref 12–28)
BUN: 17 mg/dL (ref 8–27)
Bilirubin Total: 0.4 mg/dL (ref 0.0–1.2)
CO2: 20 mmol/L (ref 20–29)
Calcium: 9.4 mg/dL (ref 8.7–10.3)
Chloride: 100 mmol/L (ref 96–106)
Creatinine, Ser: 0.79 mg/dL (ref 0.57–1.00)
GFR calc Af Amer: 90 mL/min/{1.73_m2} (ref >59)
GFR calc non Af Amer: 78 mL/min/{1.73_m2} (ref >59)
Globulin, Total: 2.7 g/dL (ref 1.5–4.5)
Glucose: 116 mg/dL — ABNORMAL HIGH (ref 65–99)
Potassium: 4 mmol/L (ref 3.5–5.2)
Sodium: 136 mmol/L (ref 134–144)
Total Protein: 7.4 g/dL (ref 6.0–8.5)

## 2019-01-06 LAB — LIPID PANEL
Chol/HDL Ratio: 3.7 ratio (ref 0.0–4.4)
Cholesterol, Total: 197 mg/dL (ref 100–199)
HDL: 53 mg/dL (ref >39)
LDL Chol Calc (NIH): 115 mg/dL — ABNORMAL HIGH (ref 0–99)
Triglycerides: 164 mg/dL — ABNORMAL HIGH (ref 0–149)
VLDL Cholesterol Cal: 29 mg/dL (ref 5–40)

## 2019-01-06 LAB — TSH: TSH: 1.22 u[IU]/mL (ref 0.450–4.500)

## 2019-02-02 ENCOUNTER — Telehealth: Payer: Self-pay | Admitting: Family Medicine

## 2019-02-02 ENCOUNTER — Other Ambulatory Visit: Payer: Self-pay | Admitting: Family Medicine

## 2019-02-02 DIAGNOSIS — M1711 Unilateral primary osteoarthritis, right knee: Secondary | ICD-10-CM

## 2019-02-02 MED ORDER — HYDROCODONE-ACETAMINOPHEN 10-325 MG PO TABS: 1.0000 | ORAL_TABLET | Freq: Four times a day (QID) | ORAL | 0 refills | Status: DC | PRN

## 2019-02-02 NOTE — Telephone Encounter (Signed)
Request sent to provider Pamella Pert)

## 2019-02-02 NOTE — Telephone Encounter (Signed)
Pt states needs her refill of HYDROcodone-acetaminophen (NORCO) 10-325 MG tablet Usually gets 3 scripts and only one was sent 01/05/19.  Pt has only 2 left.   Pt states she urgently needs this rx sent to  [place], [place] Phone:  [phone]  Fax:  [phone]

## 2019-02-02 NOTE — Telephone Encounter (Signed)
Copied from [place] [id number]. Topic: General - Other >> Feb 02, 2019 10:01 AM Carolyn Stare wrote: Pt asking for a call back  concerning HYDROcodone-acetaminophen (NORCO) 10-325 MG tablet

## 2019-02-02 NOTE — Telephone Encounter (Signed)
pmp reviewd, appropriate meds refilled 

## 2019-02-02 NOTE — Telephone Encounter (Signed)
Copied from [place] [id number]. Topic: General - Other >> Feb 02, 2019 10:01 AM Carolyn Stare wrote: Pt asking for a call back  concerning HYDROcodone-acetaminophen (NORCO) 10-325 MG tablet

## 2019-02-02 NOTE — Telephone Encounter (Signed)
Request for hydrocodone-acetaminophen 10-325 mg sent to provider Pamella Pert)

## 2019-02-02 NOTE — Telephone Encounter (Signed)
Patient is requesting a refill of the following medications: Requested Prescriptions   Pending Prescriptions Disp Refills  . HYDROcodone-acetaminophen (NORCO) 10-325 MG tablet 110 tablet 0    Sig: Take 1 tablet by mouth every 6 (six) hours as needed. DX: CHRONIC KNEE PAIN.    Date of patient request: 02/02/2019 Last office visit:01/05/2019  Date of last refill: 01/05/2019 Last refill amount: #110 Follow up time period per chart: n/a

## 2019-02-13 ENCOUNTER — Ambulatory Visit: Payer: PPO

## 2019-02-28 ENCOUNTER — Other Ambulatory Visit: Payer: Self-pay | Admitting: Family Medicine

## 2019-02-28 DIAGNOSIS — M1711 Unilateral primary osteoarthritis, right knee: Secondary | ICD-10-CM

## 2019-03-01 NOTE — Telephone Encounter (Signed)
Patient is requesting a refill of the following medications: Requested Prescriptions   Pending Prescriptions Disp Refills   HYDROcodone-acetaminophen (NORCO) 10-325 MG tablet 110 tablet 0    Sig: Take 1 tablet by mouth every 6 (six) hours as needed. DX: CHRONIC KNEE PAIN.    Date of patient request: 02/28/19 Last office visit: 01/05/19 Date of last refill: 10/05/18 Last refill amount: 110 0rf Follow up time period per chart: 04/06/19

## 2019-03-05 ENCOUNTER — Telehealth: Payer: Self-pay | Admitting: *Deleted

## 2019-03-05 DIAGNOSIS — M1711 Unilateral primary osteoarthritis, right knee: Secondary | ICD-10-CM

## 2019-03-05 MED ORDER — HYDROCODONE-ACETAMINOPHEN 10-325 MG PO TABS: 1.0000 | ORAL_TABLET | Freq: Four times a day (QID) | ORAL | 0 refills | Status: DC | PRN

## 2019-03-05 NOTE — Telephone Encounter (Signed)
Pmp reviewed Med refilled

## 2019-03-05 NOTE — Telephone Encounter (Signed)
Patient is calling for a refill on her hydrocodone last filled on 12-11 110 tabs.  Follow up scheduled 04-06-2019

## 2019-03-05 NOTE — Addendum Note (Signed)
Addended by: Rutherford Guys on: 03/05/2019 03:10 PM   Modules accepted: Orders

## 2019-03-08 ENCOUNTER — Ambulatory Visit (INDEPENDENT_AMBULATORY_CARE_PROVIDER_SITE_OTHER): Payer: PPO | Admitting: Family Medicine

## 2019-03-08 VITALS — BP 108/60 | Ht 61.0 in | Wt 174.0 lb

## 2019-03-08 DIAGNOSIS — Z0000 Encounter for general adult medical examination without abnormal findings: Secondary | ICD-10-CM | POA: Diagnosis not present

## 2019-03-08 NOTE — Progress Notes (Signed)
Presents today for [employer] Visit   Date of last exam: 01/05/2019  Interpreter used for this visit? No  I connected with  Natasha Bence on 03/08/19 by a telepehone  and verified that I am speaking with the correct person using two identifiers.   I discussed the limitations of evaluation and management by telemedicine. The patient expressed understanding and agreed to proceed.   Patient Care Team: Rutherford Guys, MD as PCP - General (Family Medicine) Rendall, Oneida Alar, MD (Inactive) (Orthopedic Surgery)   Other items to address today:   Discussed immunizations Discussed Eye/Dental Patient will discuss colonoscopy/pna at follow up visit 04/06/2019 @ 11:00 am.   Other Screening: Last screening for diabetes: 01/05/2019 Last lipid screening: 01/05/2019  ADVANCE DIRECTIVES: Discussed: yes On File: no Materials Provided:  YES (mail)  Immunization status:  Immunization History  Administered Date(s) Administered  . Hepatitis B, adult 06/04/2013, 07/03/2013  . Influenza Split 11/25/2010, 11/17/2011, 04/02/2015  . Influenza, High Dose Seasonal PF 01/06/2018, 12/13/2018  . Influenza,inj,Quad PF,6+ Mos 11/08/2012, 04/02/2015, 04/21/2016, 11/02/2016  . Influenza-Unspecified 11/05/2013  . Pneumococcal Conjugate-13 10/24/2006  . Pneumococcal Polysaccharide-23 12/26/2013  . Td 02/23/2007  . Tdap 02/23/2007, 03/02/2017  . Zoster 09/22/2013  . Zoster Recombinat (Shingrix) 06/22/2016     Health Maintenance Due  Topic Date Due  . COLONOSCOPY  10/23/2018  . PNA vac Low Risk Adult (2 of 2 - PPSV23) 12/27/2018     Functional Status Survey: Is the patient deaf or have difficulty hearing?: No Does the patient have difficulty seeing, even when wearing glasses/contacts?: No Does the patient have difficulty concentrating, remembering, or making decisions?: No Does the patient have difficulty walking or climbing stairs?: No Does the patient have  difficulty dressing or bathing?: No Does the patient have difficulty doing errands alone such as visiting a doctor's office or shopping?: No   6CIT Screen 03/08/2019  What Year? 0 points  What month? 0 points  What time? 0 points  Count back from 20 0 points  Months in reverse 0 points  Repeat phrase 0 points  Total Score 0        Clinical Support from 03/08/2019 in [facility]  AUDIT-C Score  0       Home Environment:   Lives in a one story with bonus room upstairs No scattered No grab Adequate lighting/ no clutter No trouble climbing stairs  Timed Warm up N/A   Patient Active Problem List   Diagnosis Date Noted  . Osteopenia 01/05/2019  . Hypothyroidism due to acquired atrophy of thyroid 01/05/2019  . Pure hypercholesterolemia 10/05/2018  . Class 1 obesity due to excess calories without serious comorbidity with body mass index (BMI) of 30.0 to 30.9 in adult 08/05/2016  . Chronic pain syndrome 08/05/2016  . Glucose intolerance 08/05/2016  . Arthritis of knee, right 12/23/2013  . Hepatitis C virus infection without hepatic coma 05/19/2011  . Essential hypertension 05/19/2011  . Migraine 05/19/2011  . Insomnia 05/19/2011  . Chronic knee pain 05/19/2011  . Depression with anxiety 05/19/2011  . Cervical disc syndrome 05/19/2011  . Gastroesophageal reflux 05/19/2011     Past Medical History:  Diagnosis Date  . Anxiety   . Anxiety and depression   . Arthritis   . Bulging disc 1990's   c4-5  . Cataract    right eye  . Depression   . GERD (gastroesophageal reflux disease)   . H/O: osteoarthritis   . Headache  migraines occasionally  . Hepatitis C    IA viral load low 02/09    BX past mild fibrosis  . Hypertension   . Insomnia, unspecified   . Knee pain   . Post-menopause 2010     Past Surgical History:  Procedure Laterality Date  . BREAST BIOPSY Left   . BUNIONECTOMY Right 1983  . COLONOSCOPY  10/22/13  . EYE SURGERY Right 2010   hole  in macula  . KNEE SURGERY Right     x4  . ORIF ELBOW FRACTURE Right 04/08/2015   Procedure: OPEN REDUCTION INTERNAL FIXATION (ORIF) ELBOW/OLECRANON FRACTURE;  Surgeon: Milly Jakob, MD;  Location: [place];  Service: Orthopedics;  Laterality: Right;  . ORIF RADIAL FRACTURE Right 04/08/2015   Procedure: OPEN REDUCTION INTERNAL FIXATION (ORIF) RADIAL HEAD ;  Surgeon: Milly Jakob, MD;  Location: [facility];  Service: Orthopedics;  Laterality: Right;  . OVARIAN CYST REMOVAL    . TOTAL KNEE ARTHROPLASTY Right 12/24/2013   Procedure: RIGHT TOTAL KNEE ARTHROPLASTY;  Surgeon: Kerin Salen, MD;  Location: [facility];  Service: Orthopedics;  Laterality: Right;  . TUBAL LIGATION  1987     Family History  Problem Relation Age of Onset  . Cancer Mother        Lung with brain mets  . Diabetes Mother   . Stroke Father 32  . Diabetes Father   . Colon cancer Father 35       colorectal/anal cancer  . Hypertension Brother   . Bipolar disorder Daughter   . Bipolar disorder Son   . Diabetes Maternal Grandfather   . Heart disease Maternal Grandfather   . Emphysema Paternal Grandmother   . Cancer Paternal Grandfather        stomach  . Breast cancer Maternal Aunt      Social History   Socioeconomic History  . Marital status: Widowed    Spouse name: Not on file  . Number of children: Not on file  . Years of education: Not on file  . Highest education level: Not on file  Occupational History  . Occupation: Astronomer  Tobacco Use  . Smoking status: Never Smoker  . Smokeless tobacco: Never Used  Substance and Sexual Activity  . Alcohol use: Yes    Comment: a glass of wine/rarely  . Drug use: No  . Sexual activity: Yes    Birth control/protection: Post-menopausal  Other Topics Concern  . Not on file  Social History Narrative   Marital status: Widower since 2009 from Hepatitis C; + Significant other.       Children:  2 children; 5 grandchildren; no gg.      Lives: with significant Alycia Rossetti).      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       Exercise: no formal exercise; very busy with grandchildren and work.   Social Determinants of Health   Financial Resource Strain:   . Difficulty of Paying Living Expenses: Not on file  Food Insecurity:   . Worried About Charity fundraiser in the Last Year: Not on file  . Ran Out of Food in the Last Year: Not on file  Transportation Needs:   . Lack of Transportation (Medical): Not on file  . Lack of Transportation (Non-Medical): Not on file  Physical Activity:   .  Days of Exercise per Week: Not on file  . Minutes of Exercise per Session: Not on file  Stress:   . Feeling of Stress : Not on file  Social Connections:   . Frequency of Communication with Friends and Family: Not on file  . Frequency of Social Gatherings with Friends and Family: Not on file  . Attends Religious Services: Not on file  . Active Member of Clubs or Organizations: Not on file  . Attends Archivist Meetings: Not on file  . Marital Status: Not on file  Intimate Partner Violence:   . Fear of Current or Ex-Partner: Not on file  . Emotionally Abused: Not on file  . Physically Abused: Not on file  . Sexually Abused: Not on file     Allergies  Allergen Reactions  . Latex Other (See Comments)    "Latex tape used during surgery - breaks my skin out"  . Adhesive [Tape] Rash    "per pt - surgical tape, also causes itching     Prior to Admission medications   Medication Sig Start Date End Date Taking? Authorizing Provider  chlorthalidone (HYGROTON) 25 MG tablet Take 1 tablet (25 mg total) by mouth daily. 01/05/19  Yes Rutherford Guys, MD  citalopram (CELEXA) 40 MG tablet Take 1 tablet (40 mg total) by mouth every morning. 01/05/19  Yes Rutherford Guys, MD  diclofenac Sodium (VOLTAREN) 1 % GEL Apply 4 g topically 4 (four) times  daily. 01/05/19  Yes Rutherford Guys, MD  fluticasone [facility]) 50 MCG/ACT nasal spray Place 2 sprays into both nostrils daily. 06/15/17  Yes Wardell Honour, MD  gabapentin (NEURONTIN) 400 MG capsule Take 1 capsule (400 mg total) by mouth 3 (three) times daily. 01/05/19  Yes Rutherford Guys, MD  HYDROcodone-acetaminophen [facility]) 10-325 MG tablet Take 1 tablet by mouth every 6 (six) hours as needed. Dx chronic knee pain 10/05/18  Yes Rutherford Guys, MD  HYDROcodone-acetaminophen [facility]) 10-325 MG tablet Take 1 tablet by mouth every 6 (six) hours as needed. DX: CHRONIC KNEE PAIN. 03/05/19  Yes Rutherford Guys, MD  losartan (COZAAR) 100 MG tablet Take 1 tablet (100 mg total) by mouth daily. 01/05/19  Yes Rutherford Guys, MD  lovastatin (MEVACOR) 20 MG tablet Take 1 tablet (20 mg total) by mouth at bedtime. 07/05/18  Yes Rutherford Guys, MD  meloxicam (MOBIC) 15 MG tablet Take 1 tablet (15 mg total) by mouth daily. 07/05/18  Yes Rutherford Guys, MD  metFORMIN (GLUCOPHAGE-XR) 500 MG 24 hr tablet Take 1 tablet (500 mg total) by mouth daily with breakfast. 01/05/19  Yes Rutherford Guys, MD  omeprazole (PRILOSEC) 20 MG capsule Take 1 capsule (20 mg total) by mouth every morning. 07/26/18  Yes Rutherford Guys, MD  SUMAtriptan (IMITREX) 100 MG tablet Take 1 tablet (100 mg total) by mouth once for 1 dose. May repeat in 2 hours if headache persists or recurs. 10/05/18 03/08/19 Yes Rutherford Guys, MD     Depression screen [facility] 2/9 03/08/2019 01/05/2019 07/05/2018 01/06/2018 10/03/2017  Decreased Interest 0 0 0 0 0  Down, Depressed, Hopeless 0 0 0 0 0  PHQ - 2 Score 0 0 0 0 0  Altered sleeping - - - - -  Tired, decreased energy - - - - -  Change in appetite - - - - -  Feeling bad or failure about yourself  - - - - -  Trouble concentrating - - - - -  Moving slowly or fidgety/restless - - - - -  Suicidal thoughts - - - - -  PHQ-9 Score - - - - -     Fall Risk  03/08/2019 01/05/2019 07/05/2018 04/03/2018  01/06/2018  Falls in the past year? 0 0 0 0 0  Number falls in past yr: 0 0 - 0 -  Comment - - - - -  Injury with Fall? 0 0 - 0 -  Risk Factor Category  - - - - -  Risk for fall due to : - - - - -  Follow up Falls evaluation completed;Education provided - - - -      PHYSICAL EXAM: BP 108/60 Comment: taken from previous visit  Ht 5\' 1"  (1.549 m)   Wt 174 lb (78.9 kg)   BMI 32.88 kg/m    Wt Readings from Last 3 Encounters:  03/08/19 174 lb (78.9 kg)  01/05/19 174 lb 3.2 oz (79 kg)  10/05/18 176 lb (79.8 kg)     Medicare annual wellness visit, subsequent   Education/Counseling provided regarding diet and exercise, prevention of chronic diseases, smoking/tobacco cessation, if applicable, and reviewed "Covered Medicare Preventive Services."

## 2019-03-08 NOTE — Patient Instructions (Addendum)
Thank you for taking time to come for your Medicare Wellness Visit. I appreciate your ongoing commitment to your health goals. Please review the following plan we discussed and let me know if I can assist you in the future.  Rossy Virag LPN   Preventive Care 67 Years and Older, Female Preventive care refers to lifestyle choices and visits with your health care provider that can promote health and wellness. This includes:  A yearly physical exam. This is also called an annual well check.  Regular dental and eye exams.  Immunizations.  Screening for certain conditions.  Healthy lifestyle choices, such as diet and exercise. What can I expect for my preventive care visit? Physical exam Your health care provider will check:  Height and weight. These may be used to calculate body mass index (BMI), which is a measurement that tells if you are at a healthy weight.  Heart rate and blood pressure.  Your skin for abnormal spots. Counseling Your health care provider may ask you questions about:  Alcohol, tobacco, and drug use.  Emotional well-being.  Home and relationship well-being.  Sexual activity.  Eating habits.  History of falls.  Memory and ability to understand (cognition).  Work and work environment.  Pregnancy and menstrual history. What immunizations do I need?  Influenza (flu) vaccine  This is recommended every year. Tetanus, diphtheria, and pertussis (Tdap) vaccine  You may need a Td booster every 10 years. Varicella (chickenpox) vaccine  You may need this vaccine if you have not already been vaccinated. Zoster (shingles) vaccine  You may need this after age 60. Pneumococcal conjugate (PCV13) vaccine  One dose is recommended after age 67. Pneumococcal polysaccharide (PPSV23) vaccine  One dose is recommended after age 67. Measles, mumps, and rubella (MMR) vaccine  You may need at least one dose of MMR if you were born in 1957 or later. You may also  need a second dose. Meningococcal conjugate (MenACWY) vaccine  You may need this if you have certain conditions. Hepatitis A vaccine  You may need this if you have certain conditions or if you travel or work in places where you may be exposed to hepatitis A. Hepatitis B vaccine  You may need this if you have certain conditions or if you travel or work in places where you may be exposed to hepatitis B. Haemophilus influenzae type b (Hib) vaccine  You may need this if you have certain conditions. You may receive vaccines as individual doses or as more than one vaccine together in one shot (combination vaccines). Talk with your health care provider about the risks and benefits of combination vaccines. What tests do I need? Blood tests  Lipid and cholesterol levels. These may be checked every 5 years, or more frequently depending on your overall health.  Hepatitis C test.  Hepatitis B test. Screening  Lung cancer screening. You may have this screening every year starting at age 55 if you have a 30-pack-year history of smoking and currently smoke or have quit within the past 15 years.  Colorectal cancer screening. All adults should have this screening starting at age 50 and continuing until age 75. Your health care provider may recommend screening at age 45 if you are at increased risk. You will have tests every 1-10 years, depending on your results and the type of screening test.  Diabetes screening. This is done by checking your blood sugar (glucose) after you have not eaten for a while (fasting). You may have this done every   1-3 years.  Mammogram. This may be done every 1-2 years. Talk with your health care provider about how often you should have regular mammograms.  BRCA-related cancer screening. This may be done if you have a family history of breast, ovarian, tubal, or peritoneal cancers. Other tests  Sexually transmitted disease (STD) testing.  Bone density scan. This is done  to screen for osteoporosis. You may have this done starting at age 63. Follow these instructions at home: Eating and drinking  Eat a diet that includes fresh fruits and vegetables, whole grains, lean protein, and low-fat dairy products. Limit your intake of foods with high amounts of sugar, saturated fats, and salt.  Take vitamin and mineral supplements as recommended by your health care provider.  Do not drink alcohol if your health care provider tells you not to drink.  If you drink alcohol: ? Limit how much you have to 0-1 drink a day. ? Be aware of how much alcohol is in your drink. In the [place] one drink equals one 12 oz bottle of beer (355 mL), one 5 oz glass of wine (148 mL), or one 1 oz glass of hard liquor (44 mL). Lifestyle  Take daily care of your teeth and gums.  Stay active. Exercise for at least 30 minutes on 5 or more days each week.  Do not use any products that contain nicotine or tobacco, such as cigarettes, e-cigarettes, and chewing tobacco. If you need help quitting, ask your health care provider.  If you are sexually active, practice safe sex. Use a condom or other form of protection in order to prevent STIs (sexually transmitted infections).  Talk with your health care provider about taking a low-dose aspirin or statin. What's next?  Go to your health care provider once a year for a well check visit.  Ask your health care provider how often you should have your eyes and teeth checked.  Stay up to date on all vaccines. This information is not intended to replace advice given to you by your health care provider. Make sure you discuss any questions you have with your health care provider. Document Revised: 02/02/2018 Document Reviewed: 02/02/2018 Elsevier Patient Education  2020 [employer].

## 2019-03-29 ENCOUNTER — Ambulatory Visit
Admission: RE | Admit: 2019-03-29 | Discharge: 2019-03-29 | Disposition: A | Discharge status: Home / Self Care | Payer: PPO | Source: Ambulatory Visit | Attending: Family Medicine | Admitting: Family Medicine

## 2019-03-29 ENCOUNTER — Other Ambulatory Visit: Payer: Self-pay

## 2019-03-29 DIAGNOSIS — Z1231 Encounter for screening mammogram for malignant neoplasm of breast: Secondary | ICD-10-CM

## 2019-03-31 DIAGNOSIS — Z20822 Contact with and (suspected) exposure to covid-19: Secondary | ICD-10-CM | POA: Diagnosis not present

## 2019-03-31 DIAGNOSIS — Z20828 Contact with and (suspected) exposure to other viral communicable diseases: Secondary | ICD-10-CM | POA: Diagnosis not present

## 2019-04-06 ENCOUNTER — Telehealth (INDEPENDENT_AMBULATORY_CARE_PROVIDER_SITE_OTHER): Payer: PPO | Admitting: Family Medicine

## 2019-04-06 ENCOUNTER — Other Ambulatory Visit: Payer: Self-pay

## 2019-04-06 DIAGNOSIS — U071 COVID-19: Secondary | ICD-10-CM | POA: Diagnosis not present

## 2019-04-06 DIAGNOSIS — M1711 Unilateral primary osteoarthritis, right knee: Secondary | ICD-10-CM

## 2019-04-06 MED ORDER — HYDROCODONE-ACETAMINOPHEN 10-325 MG PO TABS: 1.0000 | ORAL_TABLET | Freq: Four times a day (QID) | ORAL | 0 refills | Status: DC | PRN

## 2019-04-06 MED ORDER — GABAPENTIN 400 MG PO CAPS: 400.0000 mg | ORAL_CAPSULE | Freq: Three times a day (TID) | ORAL | 2 refills | Status: DC

## 2019-04-06 MED ORDER — LOVASTATIN 20 MG PO TABS: 20.0000 mg | ORAL_TABLET | Freq: Every day | ORAL | 3 refills | Status: DC

## 2019-04-06 MED ORDER — MELOXICAM 15 MG PO TABS: 15.0000 mg | ORAL_TABLET | Freq: Every day | ORAL | 1 refills | Status: DC

## 2019-04-06 NOTE — Progress Notes (Signed)
Pt tested positive for covid 03/31/2019, what lead her to be tested was the loss of smell and taste. She is now only dealing with the fatigue. Still taking meds as prescribed completed gad7, score 7.

## 2019-04-06 NOTE — Progress Notes (Signed)
Virtual Visit Note  I connected with patient on 04/06/19 at 1154am by phone and verified that I am speaking with the correct person using two identifiers. Shantai Shobe is currently located at home and patient is currently with them during visit. The provider, Rutherford Guys, MD is located in their office at time of visit.  I discussed the limitations, risks, security and privacy concerns of performing an evaluation and management service by telephone and the availability of in person appointments. I also discussed with the patient that there may be a patient responsible charge related to this service. The patient expressed understanding and agreed to proceed.   CC: covid positive feb 6th  HPI ? PMH: HTN,HLP,GERD,migraines,chronic pain multiple sites on opiate therapy, prediabetes and depression with anxiety  Last OV Nov - low D, increase OTC intake, A1c 6.5  Very tired, fatigue intense, body aches, headaches Has lost her sense of taste Has no fever, SOB, cough Denies any GI sx Her daughter is currently living with them Needs refills of vicodin, gabapentin, lovastatin and meloxicam She works in [employer], [place] has reached out to her pmp reviewed  Allergies  Allergen Reactions  . Latex Other (See Comments)    "Latex tape used during surgery - breaks my skin out"  . Adhesive [Tape] Rash    "per pt - surgical tape, also causes itching    Prior to Admission medications   Medication Sig Start Date End Date Taking? Authorizing Provider  chlorthalidone (HYGROTON) 25 MG tablet Take 1 tablet (25 mg total) by mouth daily. 01/05/19  Yes Rutherford Guys, MD  citalopram (CELEXA) 40 MG tablet Take 1 tablet (40 mg total) by mouth every morning. 01/05/19  Yes Rutherford Guys, MD  diclofenac Sodium (VOLTAREN) 1 % GEL Apply 4 g topically 4 (four) times daily. 01/05/19  Yes Rutherford Guys, MD  fluticasone [facility]) 50 MCG/ACT nasal spray Place 2 sprays into both nostrils  daily. 06/15/17  Yes Wardell Honour, MD  gabapentin (NEURONTIN) 400 MG capsule Take 1 capsule (400 mg total) by mouth 3 (three) times daily. 01/05/19  Yes Rutherford Guys, MD  HYDROcodone-acetaminophen [facility]) 10-325 MG tablet Take 1 tablet by mouth every 6 (six) hours as needed. DX: CHRONIC KNEE PAIN. 03/05/19  Yes Rutherford Guys, MD  losartan (COZAAR) 100 MG tablet Take 1 tablet (100 mg total) by mouth daily. 01/05/19  Yes Rutherford Guys, MD  lovastatin (MEVACOR) 20 MG tablet Take 1 tablet (20 mg total) by mouth at bedtime. 07/05/18  Yes Rutherford Guys, MD  meloxicam (MOBIC) 15 MG tablet Take 1 tablet (15 mg total) by mouth daily. 07/05/18  Yes Rutherford Guys, MD  metFORMIN (GLUCOPHAGE-XR) 500 MG 24 hr tablet Take 1 tablet (500 mg total) by mouth daily with breakfast. 01/05/19  Yes Rutherford Guys, MD  omeprazole (PRILOSEC) 20 MG capsule Take 1 capsule (20 mg total) by mouth every morning. 07/26/18  Yes Rutherford Guys, MD  SUMAtriptan (IMITREX) 100 MG tablet Take 1 tablet (100 mg total) by mouth once for 1 dose. May repeat in 2 hours if headache persists or recurs. 10/05/18 03/08/19  Rutherford Guys, MD    Past Medical History:  Diagnosis Date  . Anxiety   . Anxiety and depression   . Arthritis   . Bulging disc 1990's   c4-5  . Cataract    right eye  . Depression   . GERD (gastroesophageal reflux disease)   .  H/O: osteoarthritis   . Headache    migraines occasionally  . Hepatitis C    IA viral load low 02/09    BX past mild fibrosis  . Hypertension   . Insomnia, unspecified   . Knee pain   . Post-menopause 2010    Past Surgical History:  Procedure Laterality Date  . BREAST BIOPSY Left   . BUNIONECTOMY Right 1983  . COLONOSCOPY  10/22/13  . EYE SURGERY Right 2010   hole in macula  . KNEE SURGERY Right     x4  . ORIF ELBOW FRACTURE Right 04/08/2015   Procedure: OPEN REDUCTION INTERNAL FIXATION (ORIF) ELBOW/OLECRANON FRACTURE;  Surgeon: Milly Jakob, MD;  Location: [place];  Service: Orthopedics;  Laterality: Right;  . ORIF RADIAL FRACTURE Right 04/08/2015   Procedure: OPEN REDUCTION INTERNAL FIXATION (ORIF) RADIAL HEAD ;  Surgeon: Milly Jakob, MD;  Location: [place];  Service: Orthopedics;  Laterality: Right;  . OVARIAN CYST REMOVAL    . TOTAL KNEE ARTHROPLASTY Right 12/24/2013   Procedure: RIGHT TOTAL KNEE ARTHROPLASTY;  Surgeon: Kerin Salen, MD;  Location: [place];  Service: Orthopedics;  Laterality: Right;  . TUBAL LIGATION  1987    Social History   Tobacco Use  . Smoking status: Never Smoker  . Smokeless tobacco: Never Used  Substance Use Topics  . Alcohol use: Yes    Comment: a glass of wine/rarely    Family History  Problem Relation Age of Onset  . Cancer Mother        Lung with brain mets  . Diabetes Mother   . Stroke Father 25  . Diabetes Father   . Colon cancer Father 81       colorectal/anal cancer  . Hypertension Brother   . Bipolar disorder Daughter   . Bipolar disorder Son   . Diabetes Maternal Grandfather   . Heart disease Maternal Grandfather   . Emphysema Paternal Grandmother   . Cancer Paternal Grandfather        stomach  . Breast cancer Maternal Aunt     ROS Per hpi  Objective  Vitals as reported by the patient: none Gen: AAOx3, NAD Speaking in full sentences, breathing comfortably    ASSESSMENT and PLAN  1. COVID-19 virus infection Clinically stable. Discussed infusion treatment, she declines referral at this time. Reviewed quarantine guidelines. DOH involved. Discussed supportive measures and RTC precautions.  2. Arthritis of knee, right - HYDROcodone-acetaminophen (NORCO) 10-325 MG tablet; Take 1 tablet by mouth every 6 (six) hours as needed. DX: CHRONIC KNEE PAIN.  Other orders - gabapentin (NEURONTIN) 400 MG capsule; Take 1 capsule (400 mg total) by mouth 3 (three) times daily. - meloxicam (MOBIC) 15 MG tablet; Take 1 tablet (15 mg total) by mouth daily. - lovastatin (MEVACOR) 20 MG tablet; Take 1  tablet (20 mg total) by mouth at bedtime. - HYDROcodone-acetaminophen (NORCO) 10-325 MG tablet; Take 1 tablet by mouth every 6 (six) hours as needed. Dx chronic knee pain - HYDROcodone-acetaminophen (NORCO) 10-325 MG tablet; Take 1 tablet by mouth every 6 (six) hours as needed. Ok to fill 60 days after signature  FOLLOW-UP: 3 months   The above assessment and management plan was discussed with the patient. The patient verbalized understanding of and has agreed to the management plan. Patient is aware to call the clinic if symptoms persist or worsen. Patient is aware when to return to the clinic for a follow-up visit. Patient educated on when it is appropriate to go to  the emergency department.    I provided 18 minutes of non-face-to-face time during this encounter.  Rutherford Guys, MD Primary Care at [place], [place] [address] Ph.  [phone] Fax [phone]

## 2019-06-25 ENCOUNTER — Telehealth: Payer: Self-pay | Admitting: Family Medicine

## 2019-06-25 DIAGNOSIS — M1711 Unilateral primary osteoarthritis, right knee: Secondary | ICD-10-CM

## 2019-06-25 MED ORDER — HYDROCODONE-ACETAMINOPHEN 10-325 MG PO TABS: 1.0000 | ORAL_TABLET | Freq: Four times a day (QID) | ORAL | 0 refills | Status: DC | PRN

## 2019-06-25 NOTE — Telephone Encounter (Signed)
What is the name of the medication? HYDROcodone-acetaminophen (NORCO) 10-325 MG tablet [id number]    Have you contacted your pharmacy to request a refill? Yes. She needs an ov. She has an upcoming appointment with [place] on 07/10/19.  Which pharmacy would you like this sent to? [facility]  [facility] [address] - [place], [place]. [address], [place] [address]  Phone:  [phone] Fax:  [phone]      Patient notified that their request is being sent to the clinical staff for review and that they should receive a call once it is complete. If they do not receive a call within 72 hours they can check with their pharmacy or our office.

## 2019-06-25 NOTE — Telephone Encounter (Signed)
Pt is requesting Hydrocodone. Last refill was in Feb

## 2019-06-25 NOTE — Addendum Note (Signed)
Addended by: Rutherford Guys on: 06/25/2019 05:40 PM   Modules accepted: Orders

## 2019-06-25 NOTE — Telephone Encounter (Signed)
pmp reviewed  Med refilled 

## 2019-07-10 ENCOUNTER — Other Ambulatory Visit: Payer: Self-pay

## 2019-07-10 ENCOUNTER — Encounter: Payer: Self-pay | Admitting: Family Medicine

## 2019-07-10 ENCOUNTER — Ambulatory Visit (INDEPENDENT_AMBULATORY_CARE_PROVIDER_SITE_OTHER): Payer: PPO | Admitting: Family Medicine

## 2019-07-10 VITALS — BP 128/74 | HR 77 | Temp 97.3°F | Ht 61.0 in | Wt 167.4 lb

## 2019-07-10 DIAGNOSIS — E559 Vitamin D deficiency, unspecified: Secondary | ICD-10-CM | POA: Diagnosis not present

## 2019-07-10 DIAGNOSIS — E7800 Pure hypercholesterolemia, unspecified: Secondary | ICD-10-CM | POA: Diagnosis not present

## 2019-07-10 DIAGNOSIS — E7439 Other disorders of intestinal carbohydrate absorption: Secondary | ICD-10-CM

## 2019-07-10 DIAGNOSIS — Z9109 Other allergy status, other than to drugs and biological substances: Secondary | ICD-10-CM | POA: Diagnosis not present

## 2019-07-10 DIAGNOSIS — K219 Gastro-esophageal reflux disease without esophagitis: Secondary | ICD-10-CM | POA: Diagnosis not present

## 2019-07-10 DIAGNOSIS — E034 Atrophy of thyroid (acquired): Secondary | ICD-10-CM | POA: Diagnosis not present

## 2019-07-10 DIAGNOSIS — Z1211 Encounter for screening for malignant neoplasm of colon: Secondary | ICD-10-CM | POA: Diagnosis not present

## 2019-07-10 DIAGNOSIS — I10 Essential (primary) hypertension: Secondary | ICD-10-CM

## 2019-07-10 DIAGNOSIS — M1711 Unilateral primary osteoarthritis, right knee: Secondary | ICD-10-CM | POA: Diagnosis not present

## 2019-07-10 DIAGNOSIS — Z79899 Other long term (current) drug therapy: Secondary | ICD-10-CM

## 2019-07-10 MED ORDER — OMEPRAZOLE 20 MG PO CPDR: 20.0000 mg | DELAYED_RELEASE_CAPSULE | Freq: Every morning | ORAL | 3 refills | Status: DC

## 2019-07-10 MED ORDER — HYDROCODONE-ACETAMINOPHEN 10-325 MG PO TABS: 1.0000 | ORAL_TABLET | Freq: Four times a day (QID) | ORAL | 0 refills | Status: DC | PRN

## 2019-07-10 MED ORDER — CHLORTHALIDONE 25 MG PO TABS: 25.0000 mg | ORAL_TABLET | Freq: Every day | ORAL | 1 refills | Status: DC

## 2019-07-10 MED ORDER — SUMATRIPTAN SUCCINATE 100 MG PO TABS: 100.0000 mg | ORAL_TABLET | Freq: Once | ORAL | 5 refills | Status: DC

## 2019-07-10 MED ORDER — CITALOPRAM HYDROBROMIDE 40 MG PO TABS: 40.0000 mg | ORAL_TABLET | Freq: Every morning | ORAL | 1 refills | Status: DC

## 2019-07-10 MED ORDER — MELOXICAM 15 MG PO TABS: 15.0000 mg | ORAL_TABLET | Freq: Every day | ORAL | 1 refills | Status: DC

## 2019-07-10 MED ORDER — GABAPENTIN 400 MG PO CAPS: 400.0000 mg | ORAL_CAPSULE | Freq: Three times a day (TID) | ORAL | 2 refills | Status: DC

## 2019-07-10 MED ORDER — FLUTICASONE PROPIONATE 50 MCG/ACT NA SUSP: 2.0000 | Freq: Every day | NASAL | 11 refills | Status: DC

## 2019-07-10 NOTE — Patient Instructions (Signed)
Please call in 3 months for refill of vicodin

## 2019-07-10 NOTE — Progress Notes (Signed)
5/18/202111:46 AM  Madison Collins [birth year], 69 y.o., female [id number]  Chief Complaint  Patient presents with  . Medication Refill    will chk to see if pharmacy has the hydocodone pill, was not aware that this med had been filled  . question about post covid    HPI:   Patient is a 69 y.o. female with past medical history significant for HTN,HLP,GERD,migraines,chronic pain multiple sites on opiate therapy, prediabetes and depression with anxiety, vitamin D deficiency, h/o HCV s/p treatmentwho presents today forroutine followup  Last OV nov 2020 - no changes, work on LFM  Overall doing well and has no acute concerns today grandchildren are going back into school Has not been able to work on much LFM Ice cream is a bid thing in her home Had not been walking much since having fatigue after covid earlier this year, however this past week feeling much better and was able to have a good walk Scheduled for second covid vaccine struggles with daily calcium - vitamin D supplement Taking her meds as prescribed Due for colonoscopy, received letter, not sure if referral needed phq9 noted  Lab Results  Component Value Date   HGBA1C 6.5 (H) 01/05/2019   HGBA1C 6.4 (H) 07/12/2018   HGBA1C 6.2 (H) 01/06/2018   Lab Results  Component Value Date   LDLCALC 115 (H) 01/05/2019   CREATININE 0.79 01/05/2019     Depression screen PHQ 2/9 07/10/2019 04/06/2019 03/08/2019  Decreased Interest 0 0 0  Down, Depressed, Hopeless 1 0 0  PHQ - 2 Score 1 0 0  Altered sleeping 1 0 -  Tired, decreased energy 1 1 -  Change in appetite 1 1 -  Feeling bad or failure about yourself  0 0 -  Trouble concentrating 0 0 -  Moving slowly or fidgety/restless 0 0 -  Suicidal thoughts 0 0 -  PHQ-9 Score 4 2 -  Difficult doing work/chores - Not difficult at all -  Some recent data might be hidden    Fall Risk  07/10/2019 04/06/2019 03/08/2019 01/05/2019 07/05/2018  Falls in the past year? 0  0 0 0 0  Number falls in past yr: 0 0 0 0 -  Comment - - - - -  Injury with Fall? 0 0 0 0 -  Risk Factor Category  - - - - -  Risk for fall due to : - - - - -  Follow up - - Falls evaluation completed;Education provided - -     Allergies  Allergen Reactions  . Latex Other (See Comments)    "Latex tape used during surgery - breaks my skin out"  . Adhesive [Tape] Rash    "per pt - surgical tape, also causes itching    Prior to Admission medications   Medication Sig Start Date End Date Taking? Authorizing Provider  diclofenac Sodium (VOLTAREN) 1 % GEL Apply 4 g topically 4 (four) times daily. 01/05/19  Yes Rutherford Guys, MD  HYDROcodone-acetaminophen [facility]) 10-325 MG tablet Take 1 tablet by mouth every 6 (six) hours as needed. Dx chronic knee pain 06/25/19  Yes Rutherford Guys, MD  losartan (COZAAR) 100 MG tablet Take 1 tablet (100 mg total) by mouth daily. 01/05/19  Yes Rutherford Guys, MD  lovastatin (MEVACOR) 20 MG tablet Take 1 tablet (20 mg total) by mouth at bedtime. 04/06/19  Yes Rutherford Guys, MD  metFORMIN (GLUCOPHAGE-XR) 500 MG 24 hr tablet Take 1 tablet (500 mg total) by mouth  daily with breakfast. 01/05/19  Yes Rutherford Guys, MD  chlorthalidone (HYGROTON) 25 MG tablet Take 1 tablet (25 mg total) by mouth daily. 07/10/19   Rutherford Guys, MD  citalopram (CELEXA) 40 MG tablet Take 1 tablet (40 mg total) by mouth every morning. 07/10/19   Rutherford Guys, MD  fluticasone (FLONASE) 50 MCG/ACT nasal spray Place 2 sprays into both nostrils daily. 07/10/19   Rutherford Guys, MD  gabapentin (NEURONTIN) 400 MG capsule Take 1 capsule (400 mg total) by mouth 3 (three) times daily. 07/10/19   Rutherford Guys, MD  meloxicam (MOBIC) 15 MG tablet Take 1 tablet (15 mg total) by mouth daily. 07/10/19   Rutherford Guys, MD  omeprazole (PRILOSEC) 20 MG capsule Take 1 capsule (20 mg total) by mouth every morning. 07/10/19   Rutherford Guys, MD  SUMAtriptan (IMITREX) 100 MG tablet Take 1  tablet (100 mg total) by mouth once for 1 dose. May repeat in 2 hours if headache persists or recurs. 07/10/19 07/10/19  Rutherford Guys, MD    Past Medical History:  Diagnosis Date  . Anxiety   . Anxiety and depression   . Arthritis   . Bulging disc 1990's   c4-5  . Cataract    right eye  . Depression   . GERD (gastroesophageal reflux disease)   . H/O: osteoarthritis   . Headache    migraines occasionally  . Hepatitis C    IA viral load low 02/09    BX past mild fibrosis  . Hypertension   . Insomnia, unspecified   . Knee pain   . Post-menopause 2010    Past Surgical History:  Procedure Laterality Date  . BREAST BIOPSY Left   . BUNIONECTOMY Right 1983  . COLONOSCOPY  10/22/13  . EYE SURGERY Right 2010   hole in macula  . KNEE SURGERY Right     x4  . ORIF ELBOW FRACTURE Right 04/08/2015   Procedure: OPEN REDUCTION INTERNAL FIXATION (ORIF) ELBOW/OLECRANON FRACTURE;  Surgeon: Milly Jakob, MD;  Location: [place];  Service: Orthopedics;  Laterality: Right;  . ORIF RADIAL FRACTURE Right 04/08/2015   Procedure: OPEN REDUCTION INTERNAL FIXATION (ORIF) RADIAL HEAD ;  Surgeon: Milly Jakob, MD;  Location: [place];  Service: Orthopedics;  Laterality: Right;  . OVARIAN CYST REMOVAL    . TOTAL KNEE ARTHROPLASTY Right 12/24/2013   Procedure: RIGHT TOTAL KNEE ARTHROPLASTY;  Surgeon: Kerin Salen, MD;  Location: [place];  Service: Orthopedics;  Laterality: Right;  . TUBAL LIGATION  1987    Social History   Tobacco Use  . Smoking status: Never Smoker  . Smokeless tobacco: Never Used  Substance Use Topics  . Alcohol use: Yes    Comment: a glass of wine/rarely    Family History  Problem Relation Age of Onset  . Cancer Mother        Lung with brain mets  . Diabetes Mother   . Stroke Father 43  . Diabetes Father   . Colon cancer Father 55       colorectal/anal cancer  . Hypertension Brother   . Bipolar disorder Daughter   . Bipolar disorder Son   . Diabetes Maternal  Grandfather   . Heart disease Maternal Grandfather   . Emphysema Paternal Grandmother   . Cancer Paternal Grandfather        stomach  . Breast cancer Maternal Aunt     Review of Systems  Constitutional: Negative for chills and fever.  Respiratory: Negative for cough and shortness of breath.   Cardiovascular: Negative for chest pain, palpitations and leg swelling.  Gastrointestinal: Negative for abdominal pain, nausea and vomiting.  Musculoskeletal: Positive for joint pain.   Per hpi  OBJECTIVE:  Today's Vitals   07/10/19 1137  BP: 128/74  Pulse: 77  Temp: (!) 97.3 F (36.3 C)  SpO2: 97%  Weight: 167 lb 6.4 oz (75.9 kg)  Height: _0  (1.549 m)   Body mass index is 31.63 kg/m.   Physical Exam Vitals and nursing note reviewed.  Constitutional:      Appearance: She is well-developed.  HENT:     Head: Normocephalic and atraumatic.     Mouth/Throat:     Pharynx: No oropharyngeal exudate.  Eyes:     General: No scleral icterus.    Conjunctiva/sclera: Conjunctivae normal.     Pupils: Pupils are equal, round, and reactive to light.  Cardiovascular:     Rate and Rhythm: Normal rate and regular rhythm.     Heart sounds: Normal heart sounds. No murmur. No friction rub. No gallop.   Pulmonary:     Effort: Pulmonary effort is normal.     Breath sounds: Normal breath sounds. No wheezing or rales.  Musculoskeletal:     Cervical back: Neck supple.  Skin:    General: Skin is warm and dry.  Neurological:     Mental Status: She is alert and oriented to person, place, and time.     No results found for this or any previous visit (from the past 24 hour(s)).  No results found.   ASSESSMENT and PLAN  1. Glucose intolerance Labs pending, cont working on LFM, if above 7.0 will increase metformin and dx with DM2 - TSH - Hemoglobin A1c  2. Pure hypercholesterolemia Checking labs today, medications will be adjusted as needed.  - Lipid panel - CMP14+EGFR  3. Essential  hypertension Controlled. Continue current regime.   4. Arthritis of knee, right 5. Medication management Stable. pmp reviewed. Cont current regime. Ok to call in 3 months for refills - HYDROcodone-acetaminophen (NORCO) 10-325 MG tablet; Take 1 tablet by mouth every 6 (six) hours as needed. DX: CHRONIC KNEE PAIN. - ToxAssure Select,+Antidepr,UR  6. Colon cancer screening - Ambulatory referral to Gastroenterology  7. Environmental allergies - fluticasone (FLONASE) 50 MCG/ACT nasal spray; Place 2 sprays into both nostrils daily.  8. Gastroesophageal reflux disease without esophagitis - omeprazole (PRILOSEC) 20 MG capsule; Take 1 capsule (20 mg total) by mouth every morning.  9. Vitamin D deficiency - VITAMIN D 25 Hydroxy (Vit-D Deficiency, Fractures)  Other orders - chlorthalidone (HYGROTON) 25 MG tablet; Take 1 tablet (25 mg total) by mouth daily. - citalopram (CELEXA) 40 MG tablet; Take 1 tablet (40 mg total) by mouth every morning. - gabapentin (NEURONTIN) 400 MG capsule; Take 1 capsule (400 mg total) by mouth 3 (three) times daily. - meloxicam (MOBIC) 15 MG tablet; Take 1 tablet (15 mg total) by mouth daily. - SUMAtriptan (IMITREX) 100 MG tablet; Take 1 tablet (100 mg total) by mouth once for 1 dose. May repeat in 2 hours if headache persists or recurs. - HYDROcodone-acetaminophen (NORCO) 10-325 MG tablet; Take 1 tablet by mouth every 6 (six) hours as needed. Dx chronic knee pain - HYDROcodone-acetaminophen (NORCO) 10-325 MG tablet; Take 1 tablet by mouth every 6 (six) hours as needed. Ok to fill 60 days after signature  Return in about 6 months (around 01/10/2020).    Rutherford Guys, MD Primary Care  at [address] [place], [place] [address] Ph.  [phone] Fax [phone]

## 2019-07-11 LAB — TSH: TSH: 3.14 u[IU]/mL (ref 0.450–4.500)

## 2019-07-11 LAB — CMP14+EGFR
ALT: 11 IU/L (ref 0–32)
AST: 13 IU/L (ref 0–40)
Albumin/Globulin Ratio: 1.4 (ref 1.2–2.2)
Albumin: 4.3 g/dL (ref 3.8–4.8)
Alkaline Phosphatase: 92 IU/L (ref 48–121)
BUN/Creatinine Ratio: 31 — ABNORMAL HIGH (ref 12–28)
BUN: 25 mg/dL (ref 8–27)
Bilirubin Total: 0.2 mg/dL (ref 0.0–1.2)
CO2: 21 mmol/L (ref 20–29)
Calcium: 9.5 mg/dL (ref 8.7–10.3)
Chloride: 103 mmol/L (ref 96–106)
Creatinine, Ser: 0.8 mg/dL (ref 0.57–1.00)
GFR calc Af Amer: 89 mL/min/{1.73_m2} (ref >59)
GFR calc non Af Amer: 77 mL/min/{1.73_m2} (ref >59)
Globulin, Total: 3 g/dL (ref 1.5–4.5)
Glucose: 102 mg/dL — ABNORMAL HIGH (ref 65–99)
Potassium: 4.6 mmol/L (ref 3.5–5.2)
Sodium: 138 mmol/L (ref 134–144)
Total Protein: 7.3 g/dL (ref 6.0–8.5)

## 2019-07-11 LAB — HEMOGLOBIN A1C
Est. average glucose Bld gHb Est-mCnc: 134 mg/dL
Hgb A1c MFr Bld: 6.3 % — ABNORMAL HIGH (ref 4.8–5.6)

## 2019-07-11 LAB — LIPID PANEL
Chol/HDL Ratio: 3.5 ratio (ref 0.0–4.4)
Cholesterol, Total: 208 mg/dL — ABNORMAL HIGH (ref 100–199)
HDL: 59 mg/dL (ref >39)
LDL Chol Calc (NIH): 125 mg/dL — ABNORMAL HIGH (ref 0–99)
Triglycerides: 136 mg/dL (ref 0–149)
VLDL Cholesterol Cal: 24 mg/dL (ref 5–40)

## 2019-07-11 LAB — VITAMIN D 25 HYDROXY (VIT D DEFICIENCY, FRACTURES): Vit D, 25-Hydroxy: 28.9 ng/mL — ABNORMAL LOW (ref 30.0–100.0)

## 2019-07-13 LAB — TOXASSURE SELECT,+ANTIDEPR,UR

## 2019-07-26 ENCOUNTER — Other Ambulatory Visit: Payer: Self-pay | Admitting: Family Medicine

## 2019-07-26 MED ORDER — LOVASTATIN 40 MG PO TABS: 40.0000 mg | ORAL_TABLET | Freq: Every day | ORAL | 3 refills | Status: DC

## 2019-08-01 ENCOUNTER — Other Ambulatory Visit: Payer: Self-pay | Admitting: Family Medicine

## 2019-09-25 ENCOUNTER — Other Ambulatory Visit: Payer: Self-pay | Admitting: Family Medicine

## 2019-09-25 MED ORDER — HYDROCODONE-ACETAMINOPHEN 10-325 MG PO TABS: 1.0000 | ORAL_TABLET | Freq: Four times a day (QID) | ORAL | 0 refills | Status: DC | PRN

## 2019-09-25 NOTE — Telephone Encounter (Signed)
Pt is calling and said she does not have to see dr Pamella Pert until every 6 months and that doctor told her when its time for her hydrocodone 10-325 to be refill she would refill the medication. Pt is calling a little early. [facility] in [place]. It looks like the pt appt in nov 2021 needs to be rsc

## 2019-10-26 ENCOUNTER — Other Ambulatory Visit: Payer: Self-pay

## 2019-10-26 ENCOUNTER — Other Ambulatory Visit: Payer: PPO

## 2019-10-26 DIAGNOSIS — Z20822 Contact with and (suspected) exposure to covid-19: Secondary | ICD-10-CM

## 2019-10-27 LAB — NOVEL CORONAVIRUS, NAA: SARS-CoV-2, NAA: NOT DETECTED

## 2019-10-29 ENCOUNTER — Other Ambulatory Visit: Payer: Self-pay | Admitting: Family Medicine

## 2019-11-05 DIAGNOSIS — M25512 Pain in left shoulder: Secondary | ICD-10-CM | POA: Diagnosis not present

## 2019-11-05 DIAGNOSIS — Z8616 Personal history of COVID-19: Secondary | ICD-10-CM | POA: Diagnosis not present

## 2019-11-05 DIAGNOSIS — I10 Essential (primary) hypertension: Secondary | ICD-10-CM | POA: Diagnosis not present

## 2019-11-05 DIAGNOSIS — R7303 Prediabetes: Secondary | ICD-10-CM | POA: Diagnosis not present

## 2019-11-05 DIAGNOSIS — S52125A Nondisplaced fracture of head of left radius, initial encounter for closed fracture: Secondary | ICD-10-CM | POA: Diagnosis not present

## 2019-11-05 DIAGNOSIS — S50912A Unspecified superficial injury of left forearm, initial encounter: Secondary | ICD-10-CM | POA: Diagnosis not present

## 2019-11-05 DIAGNOSIS — W010XXA Fall on same level from slipping, tripping and stumbling without subsequent striking against object, initial encounter: Secondary | ICD-10-CM | POA: Diagnosis not present

## 2019-11-18 ENCOUNTER — Other Ambulatory Visit: Payer: Self-pay | Admitting: Family Medicine

## 2019-11-18 ENCOUNTER — Encounter: Payer: Self-pay | Admitting: Family Medicine

## 2019-11-18 DIAGNOSIS — M1711 Unilateral primary osteoarthritis, right knee: Secondary | ICD-10-CM

## 2019-11-19 MED ORDER — HYDROCODONE-ACETAMINOPHEN 10-325 MG PO TABS: 1.0000 | ORAL_TABLET | Freq: Four times a day (QID) | ORAL | 0 refills | Status: DC | PRN

## 2019-11-19 NOTE — Telephone Encounter (Signed)
pmp reviewd, appropriate meds refilled 

## 2019-11-19 NOTE — Telephone Encounter (Signed)
Patient is requesting a refill of the following medications: Requested Prescriptions   Pending Prescriptions Disp Refills   HYDROcodone-acetaminophen (NORCO) 10-325 MG tablet 110 tablet 0    Sig: Take 1 tablet by mouth every 6 (six) hours as needed. Ok to fill 60 days after signature    Date of patient request: 11/18/2019 Last office visit: 07/10/2019 Date of last refill: 09/25/2019 Last refill amount: 110 tab Follow up time period per chart: 6 months

## 2019-12-05 DIAGNOSIS — Z03818 Encounter for observation for suspected exposure to other biological agents ruled out: Secondary | ICD-10-CM | POA: Diagnosis not present

## 2020-01-08 ENCOUNTER — Ambulatory Visit: Payer: PPO | Admitting: Family Medicine

## 2020-01-29 ENCOUNTER — Encounter: Payer: Self-pay | Admitting: Family Medicine

## 2020-01-29 ENCOUNTER — Ambulatory Visit (INDEPENDENT_AMBULATORY_CARE_PROVIDER_SITE_OTHER): Payer: PPO | Admitting: Family Medicine

## 2020-01-29 ENCOUNTER — Other Ambulatory Visit: Payer: Self-pay

## 2020-01-29 VITALS — BP 151/78 | HR 84 | Temp 97.7°F | Ht 61.0 in | Wt 161.0 lb

## 2020-01-29 DIAGNOSIS — I10 Essential (primary) hypertension: Secondary | ICD-10-CM

## 2020-01-29 DIAGNOSIS — E7439 Other disorders of intestinal carbohydrate absorption: Secondary | ICD-10-CM | POA: Diagnosis not present

## 2020-01-29 DIAGNOSIS — K219 Gastro-esophageal reflux disease without esophagitis: Secondary | ICD-10-CM

## 2020-01-29 DIAGNOSIS — M1711 Unilateral primary osteoarthritis, right knee: Secondary | ICD-10-CM | POA: Diagnosis not present

## 2020-01-29 DIAGNOSIS — G8929 Other chronic pain: Secondary | ICD-10-CM

## 2020-01-29 DIAGNOSIS — Z23 Encounter for immunization: Secondary | ICD-10-CM | POA: Diagnosis not present

## 2020-01-29 DIAGNOSIS — M25561 Pain in right knee: Secondary | ICD-10-CM | POA: Diagnosis not present

## 2020-01-29 LAB — HEMOGLOBIN A1C
Est. average glucose Bld gHb Est-mCnc: 137 mg/dL
Hgb A1c MFr Bld: 6.4 % — ABNORMAL HIGH (ref 4.8–5.6)

## 2020-01-29 MED ORDER — LOSARTAN POTASSIUM 100 MG PO TABS: 100.0000 mg | ORAL_TABLET | Freq: Every day | ORAL | 3 refills | Status: DC

## 2020-01-29 MED ORDER — MELOXICAM 15 MG PO TABS: 15.0000 mg | ORAL_TABLET | Freq: Every day | ORAL | 2 refills | Status: DC

## 2020-01-29 MED ORDER — HYDROCODONE-ACETAMINOPHEN 10-325 MG PO TABS: 1.0000 | ORAL_TABLET | Freq: Four times a day (QID) | ORAL | 0 refills | Status: DC | PRN

## 2020-01-29 MED ORDER — GABAPENTIN 400 MG PO CAPS: 400.0000 mg | ORAL_CAPSULE | Freq: Three times a day (TID) | ORAL | 2 refills | Status: DC

## 2020-01-29 MED ORDER — METFORMIN HCL ER 500 MG PO TB24: ORAL_TABLET | ORAL | 3 refills | Status: DC

## 2020-01-29 NOTE — Progress Notes (Signed)
12/7/202111:09 AM  Madison Collins Sep 24, 1950, 69 y.o., female [id number]  Chief Complaint  Patient presents with  . Medication Refill    all pended   . Stress    HPI:   Patient is a 68 y.o. female with past medical history significant forHTN,HLP,GERD,migraines,chronic pain multiple sites on opiate therapy, prediabetes and depression with anxiety, vitamin D deficiency, h/o HCV s/p treatmentwho presents today forroutine follow-up  Custody of 4 grand kids 66, 57, 61 ,5 Daughter is bipolar Has to move out of house, no place to go Money is tight Trying to sell mobile home Celexa 40 mg: started in 2008  Recently fell in sept Fx radius Left Never saw someone for this Worse a sling  Glucose intolerance cont working on LFM, if above 7.0 will increase metformin and dx with DM2 Metformin 500mg  daily Lab Results  Component Value Date   HGBA1C 6.3 (H) 07/10/2019    Pure hypercholesterolemia Checking labs today, medications will be adjusted as needed.  Lovastatin 40mg  daily Lab Results  Component Value Date   CHOL 208 (H) 07/10/2019   HDL 59 07/10/2019   LDLCALC 125 (H) 07/10/2019   TRIG 136 07/10/2019   CHOLHDL 3.5 07/10/2019   The 10-year ASCVD risk score Mikey Bussing DC Jr., et al., 2013) is: 12.5%   Values used to calculate the score:     Age: 41 years     Sex: Female     Is Non-Hispanic African American: No     Diabetic: No     Tobacco smoker: No     Systolic Blood Pressure: 409 mmHg     Is BP treated: Yes     HDL Cholesterol: 59 mg/dL     Total Cholesterol: 208 mg/dL   Essential hypertension Losartan 100mg  Chlorthalidone 25mg  BP Readings from Last 3 Encounters:  01/29/20 (!) 151/78  07/10/19 128/74  03/08/19 108/60    Arthritis of knee, right Voltaren gel Mobic 15mg  daily Gabapentin 400 mg tid  Medication management Stable. pmp unable to review. Cont current regime.  - HYDROcodone-acetaminophen (NORCO) 10-325 MG tablet; Take 1 tablet  by mouth every 6 (six) hours as needed. DX: CHRONIC KNEE PAIN. - ToxAssure Select,+Antidepr,UR (last 07/10/19)  Colon cancer screening: will schedule in the new year Mammogram: 03/29/19 [place] 1  Environmental allergies - fluticasone (FLONASE) 2 sprays into both nostrils daily.  Gastroesophageal reflux disease without esophagitis - omeprazole (PRILOSEC) 20 MG capsule daily  Vitamin D deficiency Last vitamin D Lab Results  Component Value Date   VD25OH 28.9 (L) 07/10/2019    Depression screen [facility] 2/9 01/29/2020 07/10/2019 04/06/2019  Decreased Interest 0 0 0  Down, Depressed, Hopeless 0 1 0  PHQ - 2 Score 0 1 0  Altered sleeping - 1 0  Tired, decreased energy - 1 1  Change in appetite - 1 1  Feeling bad or failure about yourself  - 0 0  Trouble concentrating - 0 0  Moving slowly or fidgety/restless - 0 0  Suicidal thoughts - 0 0  PHQ-9 Score - 4 2  Difficult doing work/chores - - Not difficult at all  Some recent data might be hidden    Fall Risk  01/29/2020 07/10/2019 04/06/2019 03/08/2019 01/05/2019  Falls in the past year? 1 0 0 0 0  Number falls in past yr: 0 0 0 0 0  Comment - - - - -  Injury with Fall? 1 0 0 0 0  Risk Factor Category  - - - - -  Risk for fall due to : - - - - -  Follow up Falls evaluation completed - - Falls evaluation completed;Education provided -     Allergies  Allergen Reactions  . Latex Other (See Comments)    "Latex tape used during surgery - breaks my skin out"  . Adhesive [Tape] Rash    "per pt - surgical tape, also causes itching    Prior to Admission medications   Medication Sig Start Date End Date Taking? Authorizing Provider  chlorthalidone (HYGROTON) 25 MG tablet Take 1 tablet (25 mg total) by mouth daily. 07/10/19   Rutherford Guys, MD  citalopram (CELEXA) 40 MG tablet Take 1 tablet (40 mg total) by mouth every morning. 07/10/19   Rutherford Guys, MD  diclofenac Sodium (VOLTAREN) 1 % GEL Apply 4 g topically 4 (four) times daily.  01/05/19   Rutherford Guys, MD  fluticasone (FLONASE) 50 MCG/ACT nasal spray Place 2 sprays into both nostrils daily. 07/10/19   Rutherford Guys, MD  gabapentin (NEURONTIN) 400 MG capsule Take 1 capsule (400 mg total) by mouth 3 (three) times daily. 07/10/19   Rutherford Guys, MD  HYDROcodone-acetaminophen [facility]) 10-325 MG tablet Take 1 tablet by mouth every 6 (six) hours as needed. Ok to fill 60 days after signature 11/19/19   Rutherford Guys, MD  HYDROcodone-acetaminophen [facility]) 10-325 MG tablet Take 1 tablet by mouth every 6 (six) hours as needed. Dx chronic knee pain 11/19/19   Rutherford Guys, MD  HYDROcodone-acetaminophen [facility]) 10-325 MG tablet Take 1 tablet by mouth every 6 (six) hours as needed. DX: CHRONIC KNEE PAIN. 11/19/19   Rutherford Guys, MD  losartan (COZAAR) 100 MG tablet Take 1 tablet (100 mg total) by mouth daily. 01/05/19   Rutherford Guys, MD  lovastatin (MEVACOR) 40 MG tablet Take 1 tablet (40 mg total) by mouth at bedtime. 07/26/19   Rutherford Guys, MD  meloxicam (MOBIC) 15 MG tablet TAKE ONE TABLET BY MOUTH DAILY 09/26/19   Rutherford Guys, MD  metFORMIN (GLUCOPHAGE-XR) 500 MG 24 hr tablet TAKE ONE TABLET BY MOUTH EVERY MORNING WITH BREAKFAST 10/30/19   Rutherford Guys, MD  omeprazole (PRILOSEC) 20 MG capsule Take 1 capsule (20 mg total) by mouth every morning. 07/10/19   Rutherford Guys, MD  SUMAtriptan (IMITREX) 100 MG tablet Take 1 tablet (100 mg total) by mouth once for 1 dose. May repeat in 2 hours if headache persists or recurs. 07/10/19 07/10/19  Rutherford Guys, MD    Past Medical History:  Diagnosis Date  . Anxiety   . Anxiety and depression   . Arthritis   . Bulging disc 1990's   c4-5  . Cataract    right eye  . Depression   . GERD (gastroesophageal reflux disease)   . H/O: osteoarthritis   . Headache    migraines occasionally  . Hepatitis C    IA viral load low 02/09    BX past mild fibrosis  . Hypertension   . Insomnia, unspecified   . Knee pain    . Post-menopause 2010    Past Surgical History:  Procedure Laterality Date  . BREAST BIOPSY Left   . BUNIONECTOMY Right 1983  . COLONOSCOPY  10/22/13  . EYE SURGERY Right 2010   hole in macula  . KNEE SURGERY Right     x4  . ORIF ELBOW FRACTURE Right 04/08/2015   Procedure: OPEN REDUCTION INTERNAL FIXATION (ORIF) ELBOW/OLECRANON FRACTURE;  Surgeon: Shanon Brow  Grandville Silos, MD;  Location: [place];  Service: Orthopedics;  Laterality: Right;  . ORIF RADIAL FRACTURE Right 04/08/2015   Procedure: OPEN REDUCTION INTERNAL FIXATION (ORIF) RADIAL HEAD ;  Surgeon: Milly Jakob, MD;  Location: [place];  Service: Orthopedics;  Laterality: Right;  . OVARIAN CYST REMOVAL    . TOTAL KNEE ARTHROPLASTY Right 12/24/2013   Procedure: RIGHT TOTAL KNEE ARTHROPLASTY;  Surgeon: Kerin Salen, MD;  Location: [place];  Service: Orthopedics;  Laterality: Right;  . TUBAL LIGATION  1987    Social History   Tobacco Use  . Smoking status: Never Smoker  . Smokeless tobacco: Never Used  Substance Use Topics  . Alcohol use: Yes    Comment: a glass of wine/rarely    Family History  Problem Relation Age of Onset  . Cancer Mother        Lung with brain mets  . Diabetes Mother   . Stroke Father 71  . Diabetes Father   . Colon cancer Father 59       colorectal/anal cancer  . Hypertension Brother   . Bipolar disorder Daughter   . Bipolar disorder Son   . Diabetes Maternal Grandfather   . Heart disease Maternal Grandfather   . Emphysema Paternal Grandmother   . Cancer Paternal Grandfather        stomach  . Breast cancer Maternal Aunt     Review of Systems  Constitutional: Negative for chills, fever and malaise/fatigue.  Eyes: Negative for blurred vision and double vision.  Respiratory: Negative for cough, shortness of breath and wheezing.   Cardiovascular: Negative for chest pain, palpitations and leg swelling.  Gastrointestinal: Negative for abdominal pain, blood in stool, constipation, diarrhea, heartburn,  nausea and vomiting.  Genitourinary: Negative for dysuria, frequency and hematuria.  Musculoskeletal: Positive for joint pain. Negative for back pain.  Skin: Negative for rash.  Neurological: Negative for dizziness, weakness and headaches.  Psychiatric/Behavioral: Positive for depression. Negative for substance abuse. The patient is nervous/anxious.      OBJECTIVE:  Today's Vitals   01/29/20 1040  BP: (!) 151/78  Pulse: 84  Temp: 97.7 F (36.5 C)  SpO2: 98%  Weight: 161 lb (73 kg)  Height: 5\' 1"  (1.549 m)   Body mass index is 30.42 kg/m.   Physical Exam Constitutional:      General: She is not in acute distress.    Appearance: Normal appearance. She is not ill-appearing.  HENT:     Head: Normocephalic.  Cardiovascular:     Rate and Rhythm: Normal rate and regular rhythm.     Pulses: Normal pulses.     Heart sounds: Normal heart sounds. No murmur heard.  No friction rub. No gallop.   Pulmonary:     Effort: Pulmonary effort is normal. No respiratory distress.     Breath sounds: Normal breath sounds. No stridor. No wheezing, rhonchi or rales.  Abdominal:     General: Bowel sounds are normal.     Palpations: Abdomen is soft.     Tenderness: There is no abdominal tenderness.  Musculoskeletal:     Right lower leg: No edema.     Left lower leg: No edema.  Skin:    General: Skin is warm and dry.  Neurological:     Mental Status: She is alert and oriented to person, place, and time.  Psychiatric:        Mood and Affect: Mood normal.        Behavior: Behavior normal.  No results found for this or any previous visit (from the past 24 hour(s)).  No results found.   ASSESSMENT and PLAN  Problem List Items Addressed This Visit      Cardiovascular and Mediastinum   Essential hypertension - Primary   Relevant Medications   losartan (COZAAR) 100 MG tablet Not at goal, has been out of pain medication Will follow up at next visit in 2 months     Digestive    Gastroesophageal reflux     Musculoskeletal and Integument   Arthritis of knee, right (Chronic)   Relevant Medications   meloxicam (MOBIC) 15 MG tablet   HYDROcodone-acetaminophen (NORCO) 10-325 MG tablet     Other   Chronic knee pain   Relevant Medications   gabapentin (NEURONTIN) 400 MG capsule   meloxicam (MOBIC) 15 MG tablet   HYDROcodone-acetaminophen (NORCO) 10-325 MG tablet Stable on current regimen   Glucose intolerance   Relevant Medications   metFORMIN (GLUCOPHAGE-XR) 500 MG 24 hr tablet Will follow up with lab results   Other Relevant Orders   Hemoglobin A1c    Other Visit Diagnoses    Encounter for immunization       Relevant Orders   Pneumococcal polysaccharide vaccine 23-valent greater than or equal to 2yo subcutaneous/IM   Flu Vaccine QUAD High Dose(Fluad)      Return for next scheduled visit.   Huston Foley Akesha Uresti, FNP-BC Primary Care at [facility] [place], [place] [address] Ph.  [phone] Fax [phone]

## 2020-01-29 NOTE — Patient Instructions (Addendum)
Chronic Pain, Adult Chronic pain is a type of pain that lasts or keeps coming back (recurs) for at least six months. You may have chronic headaches, abdominal pain, or body pain. Chronic pain may be related to an illness, such as fibromyalgia or complex regional pain syndrome. Sometimes the cause of chronic pain is not known. Chronic pain can make it hard for you to do daily activities. If not treated, chronic pain can lead to other health problems, including anxiety and depression. Treatment depends on the cause and severity of your pain. You may need to work with a pain specialist to come up with a treatment plan. The plan may include medicine, counseling, and physical therapy. Many people benefit from a combination of two or more types of treatment to control their pain. Follow these instructions at home: Lifestyle  Consider keeping a pain diary to share with your health care providers.  Consider talking with a mental health care provider (psychologist) about how to cope with chronic pain.  Consider joining a chronic pain support group.  Try to control or lower your stress levels. Talk to your health care provider about strategies to do this. General instructions   Take over-the-counter and prescription medicines only as told by your health care provider.  Follow your treatment plan as told by your health care provider. This may include: ? Gentle, regular exercise. ? Eating a healthy diet that includes foods such as vegetables, fruits, fish, and lean meats. ? Cognitive or behavioral therapy. ? Working with a Community education officer. ? Meditation or yoga. ? Acupuncture or massage therapy. ? Aroma, color, light, or sound therapy. ? Local electrical stimulation. ? Shots (injections) of numbing or pain-relieving medicines into the spine or the area of pain.  Check your pain level as told by your health care provider. Ask your health care provider if you should use a pain scale.  Learn as  much as you can about how to manage your chronic pain. Ask your health care provider if an intensive pain rehabilitation program or a chronic pain specialist would be helpful.  Keep all follow-up visits as told by your health care provider. This is important. Contact a health care provider if:  Your pain gets worse.  You have new pain.  You have trouble sleeping.  You have trouble doing your normal activities.  Your pain is not controlled with treatment.  Your have side effects from pain medicine.  You feel weak. Get help right away if:  You lose feeling or have numbness in your body.  You lose control of bowel or bladder function.  Your pain suddenly gets much worse.  You develop shaking or chills.  You develop confusion.  You develop chest pain.  You have trouble breathing or shortness of breath.  You pass out.  You have thoughts about hurting yourself or others. This information is not intended to replace advice given to you by your health care provider. Make sure you discuss any questions you have with your health care provider. Document Revised: 01/21/2017 Document Reviewed: 07/29/2015 Elsevier Patient Education  [employer].     If you have lab work done today you will be contacted with your lab results within the next 2 weeks.  If you have not heard from [place] then please contact us. The fastest way to get your results is to register for My Chart.   IF you received an x-ray today, you will receive an invoice from [facility]. Please contact [facility]  at [phone] with questions or concerns regarding your invoice.   IF you received labwork today, you will receive an invoice from [place] [facility]. Please contact [facility] at [phone] with questions or concerns regarding your invoice.   Our billing staff will not be able to assist you with questions regarding bills from these companies.  You will be contacted with the lab results as soon  as they are available. The fastest way to get your results is to activate your My Chart account. Instructions are located on the last page of this paperwork. If you have not heard from [place] regarding the results in 2 weeks, please contact this office.

## 2020-01-30 ENCOUNTER — Other Ambulatory Visit: Payer: Self-pay | Admitting: Family Medicine

## 2020-01-30 ENCOUNTER — Telehealth: Payer: Self-pay

## 2020-01-30 DIAGNOSIS — E7439 Other disorders of intestinal carbohydrate absorption: Secondary | ICD-10-CM

## 2020-01-30 DIAGNOSIS — E119 Type 2 diabetes mellitus without complications: Secondary | ICD-10-CM

## 2020-01-30 MED ORDER — METFORMIN HCL ER (MOD) 500 MG PO TB24: 1000.0000 mg | ORAL_TABLET | Freq: Every day | ORAL | 3 refills | Status: DC

## 2020-01-30 MED ORDER — METFORMIN HCL ER (MOD) 1000 MG PO TB24: ORAL_TABLET | ORAL | 3 refills | Status: DC

## 2020-01-30 NOTE — Progress Notes (Signed)
If you could let Mrs. Haver know her A1c has increased and thus I am  increasing her Metformin to 1000 mg per day or 2 tabs per day.

## 2020-01-30 NOTE — Progress Notes (Signed)
I switched it back to the 500 so she will [place] need to take 2 tabs per day. Thanks!

## 2020-01-31 NOTE — Telephone Encounter (Signed)
Pt message sent to inform pt of her metformin Rx .

## 2020-02-05 ENCOUNTER — Other Ambulatory Visit: Payer: Self-pay

## 2020-02-05 MED ORDER — CHLORTHALIDONE 25 MG PO TABS
25.0000 mg | ORAL_TABLET | Freq: Every day | ORAL | 1 refills | Status: DC
Start: 2020-02-05 — End: 2020-07-11

## 2020-02-07 ENCOUNTER — Telehealth: Payer: Self-pay | Admitting: Family Medicine

## 2020-02-07 NOTE — Telephone Encounter (Signed)
Suki from [employer] patients pharmacy benefit plan needing additional info need to verify pt has Type 2 diabeties   [phone] option 3   Case number [id number]  They will fax request for info

## 2020-02-11 ENCOUNTER — Other Ambulatory Visit: Payer: Self-pay | Admitting: Family Medicine

## 2020-02-11 DIAGNOSIS — E119 Type 2 diabetes mellitus without complications: Secondary | ICD-10-CM | POA: Insufficient documentation

## 2020-02-12 ENCOUNTER — Telehealth: Payer: Self-pay | Admitting: *Deleted

## 2020-02-12 NOTE — Telephone Encounter (Signed)
Successful fax to [employer] for updated information for PA of Metformin. Patient does have type 2 Diabetes.

## 2020-02-20 ENCOUNTER — Other Ambulatory Visit: Payer: Self-pay

## 2020-02-20 DIAGNOSIS — F418 Other specified anxiety disorders: Secondary | ICD-10-CM

## 2020-02-20 MED ORDER — CITALOPRAM HYDROBROMIDE 40 MG PO TABS: 40.0000 mg | ORAL_TABLET | Freq: Every morning | ORAL | 1 refills | Status: DC

## 2020-03-13 ENCOUNTER — Other Ambulatory Visit: Payer: Self-pay | Admitting: Family Medicine

## 2020-03-13 DIAGNOSIS — M1711 Unilateral primary osteoarthritis, right knee: Secondary | ICD-10-CM

## 2020-03-13 DIAGNOSIS — M25561 Pain in right knee: Secondary | ICD-10-CM

## 2020-03-13 DIAGNOSIS — G8929 Other chronic pain: Secondary | ICD-10-CM

## 2020-03-14 MED ORDER — HYDROCODONE-ACETAMINOPHEN 10-325 MG PO TABS: 1.0000 | ORAL_TABLET | Freq: Four times a day (QID) | ORAL | 0 refills | Status: DC | PRN

## 2020-03-14 NOTE — Telephone Encounter (Signed)
REFILL maybe too soon:  Patient is requesting a refill of the following medications: Requested Prescriptions   Pending Prescriptions Disp Refills   HYDROcodone-acetaminophen (NORCO) 10-325 MG tablet 110 tablet 0    Sig: Take 1 tablet by mouth every 6 (six) hours as needed. DX: CHRONIC KNEE PAIN.    Date of patient request: 03/13/20 Last office visit: 01/29/20 Date of last refill: 01/29/20 Last refill amount: 110 + 0 Follow up time period per chart: None scheduled

## 2020-03-17 ENCOUNTER — Observation Stay (HOSPITAL_COMMUNITY)
Admission: EM | Admit: 2020-03-17 | Discharge: 2020-03-18 | Disposition: A | Discharge status: Home / Self Care | Payer: PPO | Attending: Family Medicine | Admitting: Family Medicine

## 2020-03-17 ENCOUNTER — Other Ambulatory Visit: Payer: Self-pay

## 2020-03-17 ENCOUNTER — Encounter (HOSPITAL_COMMUNITY): Payer: Self-pay | Admitting: Obstetrics and Gynecology

## 2020-03-17 ENCOUNTER — Emergency Department (HOSPITAL_COMMUNITY): Payer: PPO

## 2020-03-17 DIAGNOSIS — R071 Chest pain on breathing: Secondary | ICD-10-CM | POA: Diagnosis present

## 2020-03-17 DIAGNOSIS — T402X1A Poisoning by other opioids, accidental (unintentional), initial encounter: Secondary | ICD-10-CM | POA: Diagnosis not present

## 2020-03-17 DIAGNOSIS — R000 Tachycardia, unspecified: Secondary | ICD-10-CM | POA: Diagnosis not present

## 2020-03-17 DIAGNOSIS — R404 Transient alteration of awareness: Secondary | ICD-10-CM | POA: Diagnosis not present

## 2020-03-17 DIAGNOSIS — T401X1A Poisoning by heroin, accidental (unintentional), initial encounter: Principal | ICD-10-CM | POA: Diagnosis present

## 2020-03-17 DIAGNOSIS — R110 Nausea: Secondary | ICD-10-CM | POA: Diagnosis not present

## 2020-03-17 DIAGNOSIS — Z91040 Latex allergy status: Secondary | ICD-10-CM | POA: Insufficient documentation

## 2020-03-17 DIAGNOSIS — Z96651 Presence of right artificial knee joint: Secondary | ICD-10-CM | POA: Diagnosis not present

## 2020-03-17 DIAGNOSIS — R079 Chest pain, unspecified: Secondary | ICD-10-CM | POA: Diagnosis present

## 2020-03-17 DIAGNOSIS — Z20822 Contact with and (suspected) exposure to covid-19: Secondary | ICD-10-CM | POA: Insufficient documentation

## 2020-03-17 DIAGNOSIS — T50901A Poisoning by unspecified drugs, medicaments and biological substances, accidental (unintentional), initial encounter: Secondary | ICD-10-CM

## 2020-03-17 DIAGNOSIS — R778 Other specified abnormalities of plasma proteins: Secondary | ICD-10-CM | POA: Diagnosis not present

## 2020-03-17 DIAGNOSIS — Z7984 Long term (current) use of oral hypoglycemic drugs: Secondary | ICD-10-CM | POA: Insufficient documentation

## 2020-03-17 DIAGNOSIS — E119 Type 2 diabetes mellitus without complications: Secondary | ICD-10-CM | POA: Diagnosis not present

## 2020-03-17 DIAGNOSIS — Z79899 Other long term (current) drug therapy: Secondary | ICD-10-CM | POA: Diagnosis not present

## 2020-03-17 DIAGNOSIS — I10 Essential (primary) hypertension: Secondary | ICD-10-CM | POA: Diagnosis not present

## 2020-03-17 DIAGNOSIS — R0781 Pleurodynia: Secondary | ICD-10-CM

## 2020-03-17 DIAGNOSIS — E1165 Type 2 diabetes mellitus with hyperglycemia: Secondary | ICD-10-CM | POA: Diagnosis not present

## 2020-03-17 LAB — CBC
HCT: 36.8 % (ref 36.0–46.0)
Hemoglobin: 12.3 g/dL (ref 12.0–15.0)
MCH: 29.6 pg (ref 26.0–34.0)
MCHC: 33.4 g/dL (ref 30.0–36.0)
MCV: 88.5 fL (ref 80.0–100.0)
Platelets: 316 10*3/uL (ref 150–400)
RBC: 4.16 MIL/uL (ref 3.87–5.11)
RDW: 12.7 % (ref 11.5–15.5)
WBC: 16.3 10*3/uL — ABNORMAL HIGH (ref 4.0–10.5)
nRBC: 0 % (ref 0.0–0.2)

## 2020-03-17 LAB — RAPID URINE DRUG SCREEN, HOSP PERFORMED
Amphetamines: NOT DETECTED
Barbiturates: NOT DETECTED
Benzodiazepines: NOT DETECTED
Cocaine: NOT DETECTED
Opiates: POSITIVE — AB
Tetrahydrocannabinol: NOT DETECTED

## 2020-03-17 LAB — COMPREHENSIVE METABOLIC PANEL
ALT: 20 U/L (ref 0–44)
AST: 19 U/L (ref 15–41)
Albumin: 4.5 g/dL (ref 3.5–5.0)
Alkaline Phosphatase: 86 U/L (ref 38–126)
Anion gap: 12 (ref 5–15)
BUN: 30 mg/dL — ABNORMAL HIGH (ref 8–23)
CO2: 24 mmol/L (ref 22–32)
Calcium: 9.3 mg/dL (ref 8.9–10.3)
Chloride: 99 mmol/L (ref 98–111)
Creatinine, Ser: 0.9 mg/dL (ref 0.44–1.00)
GFR, Estimated: >60 mL/min (ref >60)
Glucose, Bld: 135 mg/dL — ABNORMAL HIGH (ref 70–99)
Potassium: 3.7 mmol/L (ref 3.5–5.1)
Sodium: 135 mmol/L (ref 135–145)
Total Bilirubin: 0.6 mg/dL (ref 0.3–1.2)
Total Protein: 7.8 g/dL (ref 6.5–8.1)

## 2020-03-17 LAB — SARS CORONAVIRUS 2 BY RT PCR (HOSPITAL ORDER, PERFORMED IN ~~LOC~~ HOSPITAL LAB): SARS Coronavirus 2: NEGATIVE

## 2020-03-17 LAB — TROPONIN I (HIGH SENSITIVITY)
Troponin I (High Sensitivity): 26 ng/L — ABNORMAL HIGH (ref <18)
Troponin I (High Sensitivity): 41 ng/L — ABNORMAL HIGH (ref <18)
Troponin I (High Sensitivity): 47 ng/L — ABNORMAL HIGH (ref <18)

## 2020-03-17 LAB — SALICYLATE LEVEL: Salicylate Lvl: <7 mg/dL — ABNORMAL LOW (ref 7.0–30.0)

## 2020-03-17 LAB — CBG MONITORING, ED: Glucose-Capillary: 116 mg/dL — ABNORMAL HIGH (ref 70–99)

## 2020-03-17 LAB — ETHANOL: Alcohol, Ethyl (B): <10 mg/dL (ref <10)

## 2020-03-17 LAB — ACETAMINOPHEN LEVEL: Acetaminophen (Tylenol), Serum: <10 ug/mL — ABNORMAL LOW (ref 10–30)

## 2020-03-17 MED ORDER — ASPIRIN EC 325 MG PO TBEC
325.0000 mg | DELAYED_RELEASE_TABLET | Freq: Once | ORAL | Status: AC
  Administered 2020-03-17: 325 mg via ORAL
  Filled 2020-03-17: qty 1

## 2020-03-17 NOTE — ED Provider Notes (Signed)
[facility] DEPT Provider Note   CSN: [id number] Arrival date & time: 03/17/20  1637     History Chief Complaint  Patient presents with  . Drug Overdose    Madison Collins is a 70 y.o. female.  Patient presents with episode of heroin opiate overdose and chest pain.  She states that this was her first use of heroin, she decided try today and snorted an amount at home. She remembers she was try to clean out her pets cages at home when she woke up with her boyfriend was doing CPR on her chest.  She denies any fevers or cough, no vomiting or diarrhea. complaining of persistent chest pain that started when she woke up.         Past Medical History:  Diagnosis Date  . Anxiety   . Anxiety and depression   . Arthritis   . Bulging disc 1990's   c4-5  . Cataract    right eye  . Depression   . GERD (gastroesophageal reflux disease)   . H/O: osteoarthritis   . Headache    migraines occasionally  . Hepatitis C    IA viral load low 02/09    BX past mild fibrosis  . Hypertension   . Insomnia, unspecified   . Knee pain   . Post-menopause 2010    Patient Active Problem List   Diagnosis Date Noted  . Diabetes ([place]) 02/11/2020  . Osteopenia 01/05/2019  . Pure hypercholesterolemia 10/05/2018  . Class 1 obesity due to excess calories without serious comorbidity with body mass index (BMI) of 30.0 to 30.9 in adult 08/05/2016  . Chronic pain syndrome 08/05/2016  . Glucose intolerance 08/05/2016  . Arthritis of knee, right 12/23/2013  . Hepatitis C virus infection without hepatic coma 05/19/2011  . Essential hypertension 05/19/2011  . Migraine 05/19/2011  . Insomnia 05/19/2011  . Chronic knee pain 05/19/2011  . Depression with anxiety 05/19/2011  . Cervical disc syndrome 05/19/2011  . Gastroesophageal reflux 05/19/2011    Past Surgical History:  Procedure Laterality Date  . BREAST BIOPSY Left   . BUNIONECTOMY Right 1983  . COLONOSCOPY   10/22/13  . EYE SURGERY Right 2010   hole in macula  . KNEE SURGERY Right     x4  . ORIF ELBOW FRACTURE Right 04/08/2015   Procedure: OPEN REDUCTION INTERNAL FIXATION (ORIF) ELBOW/OLECRANON FRACTURE;  Surgeon: Milly Jakob, MD;  Location: [facility];  Service: Orthopedics;  Laterality: Right;  . ORIF RADIAL FRACTURE Right 04/08/2015   Procedure: OPEN REDUCTION INTERNAL FIXATION (ORIF) RADIAL HEAD ;  Surgeon: Milly Jakob, MD;  Location: [facility];  Service: Orthopedics;  Laterality: Right;  . OVARIAN CYST REMOVAL    . TOTAL KNEE ARTHROPLASTY Right 12/24/2013   Procedure: RIGHT TOTAL KNEE ARTHROPLASTY;  Surgeon: Kerin Salen, MD;  Location: [facility];  Service: Orthopedics;  Laterality: Right;  . TUBAL LIGATION  1987     OB History    Gravida      Para      Term      Preterm      AB      Living  2     SAB      IAB      Ectopic      Multiple      Live Births              Family History  Problem Relation Age of Onset  . Cancer Mother  Lung with brain mets  . Diabetes Mother   . Stroke Father 30  . Diabetes Father   . Colon cancer Father 19       colorectal/anal cancer  . Hypertension Brother   . Bipolar disorder Daughter   . Bipolar disorder Son   . Diabetes Maternal Grandfather   . Heart disease Maternal Grandfather   . Emphysema Paternal Grandmother   . Cancer Paternal Grandfather        stomach  . Breast cancer Maternal Aunt     Social History   Tobacco Use  . Smoking status: Never Smoker  . Smokeless tobacco: Never Used  Vaping Use  . Vaping Use: Never used  Substance Use Topics  . Alcohol use: Yes    Comment: a glass of wine/rarely  . Drug use: Yes    Comment: Heroin on 03/17/2020    Home Medications Prior to Admission medications   Medication Sig Start Date End Date Taking? Authorizing Provider  chlorthalidone (HYGROTON) 25 MG tablet Take 1 tablet (25 mg total) by mouth daily. 02/05/20   Just, Laurita Quint, FNP  citalopram (CELEXA) 40 MG  tablet Take 1 tablet (40 mg total) by mouth every morning. 02/20/20   Just, Laurita Quint, FNP  diclofenac Sodium (VOLTAREN) 1 % GEL Apply 4 g topically 4 (four) times daily. 01/05/19   Jacelyn Pi, Lilia Argue, MD  fluticasone [facility]) 50 MCG/ACT nasal spray Place 2 sprays into both nostrils daily. 07/10/19   Daleen Squibb, MD  gabapentin (NEURONTIN) 400 MG capsule Take 1 capsule (400 mg total) by mouth 3 (three) times daily. 01/29/20   Just, Laurita Quint, FNP  HYDROcodone-acetaminophen (NORCO) 10-325 MG tablet Take 1 tablet by mouth every 6 (six) hours as needed. Ok to fill 60 days after signature 11/19/19   Jacelyn Pi, Lilia Argue, MD  HYDROcodone-acetaminophen [facility]) 10-325 MG tablet Take 1 tablet by mouth every 6 (six) hours as needed. Dx chronic knee pain 11/19/19   Jacelyn Pi, Lilia Argue, MD  HYDROcodone-acetaminophen [facility]) 10-325 MG tablet Take 1 tablet by mouth every 6 (six) hours as needed. DX: CHRONIC KNEE PAIN. 03/14/20   Just, Laurita Quint, FNP  losartan (COZAAR) 100 MG tablet Take 1 tablet (100 mg total) by mouth daily. 01/29/20   Just, Laurita Quint, FNP  lovastatin (MEVACOR) 40 MG tablet Take 1 tablet (40 mg total) by mouth at bedtime. 07/26/19   Jacelyn Pi, Lilia Argue, MD  meloxicam (MOBIC) 15 MG tablet Take 1 tablet (15 mg total) by mouth daily. 01/29/20   Just, Laurita Quint, FNP  metFORMIN (GLUMETZA) 500 MG (MOD) 24 hr tablet Take 2 tablets (1,000 mg total) by mouth daily with breakfast. 01/30/20   Just, Laurita Quint, FNP  omeprazole (PRILOSEC) 20 MG capsule Take 1 capsule (20 mg total) by mouth every morning. 07/10/19   Jacelyn Pi, Lilia Argue, MD  SUMAtriptan (IMITREX) 100 MG tablet Take 1 tablet (100 mg total) by mouth once for 1 dose. May repeat in 2 hours if headache persists or recurs. 07/10/19 07/10/19  Daleen Squibb, MD    Allergies    Latex and Adhesive [tape]  Review of Systems   Review of Systems  Constitutional: Negative for fever.  HENT: Negative for ear pain.   Eyes: Negative for pain.   Respiratory: Negative for cough.   Cardiovascular: Positive for chest pain.  Gastrointestinal: Negative for abdominal pain.  Genitourinary: Negative for flank pain.  Musculoskeletal: Negative for back pain.  Skin: Negative for  rash.  Neurological: Negative for headaches.    Physical Exam Updated Vital Signs BP (!) 149/58 (BP Location: Right Arm)   Pulse 85   Temp (!) 97.5 F (36.4 C) (Oral)   Resp 12   Ht 5' (1.524 m)   Wt 73.9 kg   SpO2 98%   BMI 31.83 kg/m   Physical Exam Constitutional:      General: She is not in acute distress.    Appearance: Normal appearance.  HENT:     Head: Normocephalic.     Nose: Nose normal.  Eyes:     Extraocular Movements: Extraocular movements intact.  Cardiovascular:     Rate and Rhythm: Normal rate.  Pulmonary:     Effort: Pulmonary effort is normal.  Musculoskeletal:        General: Normal range of motion.     Cervical back: Normal range of motion.     Comments: Mid chest wall TTP  Neurological:     General: No focal deficit present.     Mental Status: She is alert. Mental status is at baseline.     ED Results / Procedures / Treatments   Labs (all labs ordered are listed, but only abnormal results are displayed) Labs Reviewed  COMPREHENSIVE METABOLIC PANEL - Abnormal; Notable for the following components:      Result Value   Glucose, Bld 135 (*)    BUN 30 (*)    All other components within normal limits  SALICYLATE LEVEL - Abnormal; Notable for the following components:   Salicylate Lvl <5.4 (*)    All other components within normal limits  ACETAMINOPHEN LEVEL - Abnormal; Notable for the following components:   Acetaminophen (Tylenol), Serum <10 (*)    All other components within normal limits  CBC - Abnormal; Notable for the following components:   WBC 16.3 (*)    All other components within normal limits  RAPID URINE DRUG SCREEN, HOSP PERFORMED - Abnormal; Notable for the following components:   Opiates POSITIVE  (*)    All other components within normal limits  CBG MONITORING, ED - Abnormal; Notable for the following components:   Glucose-Capillary 116 (*)    All other components within normal limits  TROPONIN I (HIGH SENSITIVITY) - Abnormal; Notable for the following components:   Troponin I (High Sensitivity) 26 (*)    All other components within normal limits  TROPONIN I (HIGH SENSITIVITY) - Abnormal; Notable for the following components:   Troponin I (High Sensitivity) 41 (*)    All other components within normal limits  ETHANOL  TROPONIN I (HIGH SENSITIVITY)    EKG None  Radiology DG Chest 1 View  Result Date: 03/17/2020 CLINICAL DATA:  Pain EXAM: CHEST  1 VIEW COMPARISON:  01/23/2013 FINDINGS: The heart size and mediastinal contours are within normal limits. Both lungs are clear. The visualized skeletal structures are unremarkable. IMPRESSION: No acute abnormality of the lungs in AP portable projection. Electronically Signed   By: Eddie Candle M.D.   On: 03/17/2020 17:42    Procedures .Critical Care Performed by: Luna Fuse, MD Authorized by: Luna Fuse, MD   Critical care provider statement:    Critical care time (minutes):  40   Critical care time was exclusive of:  Separately billable procedures and treating other patients   Critical care was necessary to treat or prevent imminent or life-threatening deterioration of the following conditions:  Cardiac failure Comments:     Elevated troponin     Medications  Ordered in ED Medications  aspirin EC tablet 325 mg (has no administration in time range)    ED Course  I have reviewed the triage vital signs and the nursing notes.  Pertinent labs & imaging results that were available during my care of the patient were reviewed by me and considered in my medical decision making (see chart for details).    MDM Rules/Calculators/A&P                          Absenting cardiac work-up evaluated.  No acute findings on lab  results.  Chest x-ray is unremarkable.  Patient has some tenderness on mid chest wall likely consistent with reported CPR that was performed on her.  Initial troponin slightly elevated 26.  Repeat troponin elevated again at 46.  Given aspirin 324 mg orally I suspect possible cardiac contusion given report of CPR that was performed and persistent chest pain.  Patient otherwise appears comfortable at this time vital signs stable.  Will be brought into the hospitalist team.   Final Clinical Impression(s) / ED Diagnoses Final diagnoses:  Accidental overdose, initial encounter  Elevated troponin    Rx / DC Orders ED Discharge Orders    None       Luna Fuse, MD 03/17/20 2210

## 2020-03-17 NOTE — ED Notes (Signed)
Patient reports she was at home sick with RSV and decided to try Heroin. Patient states she has never tried it before. Patient does not want anyone informed of the heroin use.

## 2020-03-17 NOTE — ED Triage Notes (Signed)
Patient had an OD of Heroin today. Patient reportedly received CPR from family before EMS got on scene. Patient given 2mg  of Narcan. Patient reported nausea and emesis.

## 2020-03-18 ENCOUNTER — Observation Stay (HOSPITAL_BASED_OUTPATIENT_CLINIC_OR_DEPARTMENT_OTHER): Payer: PPO

## 2020-03-18 ENCOUNTER — Observation Stay (HOSPITAL_COMMUNITY): Payer: PPO

## 2020-03-18 DIAGNOSIS — R778 Other specified abnormalities of plasma proteins: Secondary | ICD-10-CM | POA: Diagnosis not present

## 2020-03-18 DIAGNOSIS — M47814 Spondylosis without myelopathy or radiculopathy, thoracic region: Secondary | ICD-10-CM | POA: Diagnosis not present

## 2020-03-18 DIAGNOSIS — T50901A Poisoning by unspecified drugs, medicaments and biological substances, accidental (unintentional), initial encounter: Secondary | ICD-10-CM

## 2020-03-18 DIAGNOSIS — T401X1A Poisoning by heroin, accidental (unintentional), initial encounter: Secondary | ICD-10-CM | POA: Diagnosis present

## 2020-03-18 DIAGNOSIS — R0781 Pleurodynia: Secondary | ICD-10-CM | POA: Diagnosis not present

## 2020-03-18 DIAGNOSIS — R079 Chest pain, unspecified: Secondary | ICD-10-CM | POA: Diagnosis not present

## 2020-03-18 DIAGNOSIS — R0789 Other chest pain: Secondary | ICD-10-CM

## 2020-03-18 DIAGNOSIS — M419 Scoliosis, unspecified: Secondary | ICD-10-CM | POA: Diagnosis not present

## 2020-03-18 LAB — ECHOCARDIOGRAM COMPLETE
Area-P 1/2: 3.77 cm2
Height: 60 in
P 1/2 time: 570 msec
S' Lateral: 2.3 cm
Weight: 2608 oz

## 2020-03-18 LAB — TROPONIN I (HIGH SENSITIVITY): Troponin I (High Sensitivity): 18 ng/L — ABNORMAL HIGH (ref <18)

## 2020-03-18 MED ORDER — ACETAMINOPHEN 325 MG PO TABS: 650.0000 mg | ORAL_TABLET | ORAL | Status: DC | PRN

## 2020-03-18 MED ORDER — BENZONATATE 100 MG PO CAPS: 100.0000 mg | ORAL_CAPSULE | Freq: Four times a day (QID) | ORAL | 1 refills | Status: DC | PRN

## 2020-03-18 MED ORDER — ENOXAPARIN SODIUM 40 MG/0.4ML ~~LOC~~ SOLN
40.0000 mg | SUBCUTANEOUS | Status: DC
  Filled 2020-03-18: qty 0.4

## 2020-03-18 MED ORDER — ONDANSETRON HCL 4 MG/2ML IJ SOLN: 4.0000 mg | Freq: Four times a day (QID) | INTRAMUSCULAR | Status: DC | PRN

## 2020-03-18 NOTE — Discharge Summary (Signed)
Physician Discharge Summary  Madison Collins GNF:[id number] DOB: 20-Oct-1950 DOA: 03/17/2020  PCP: Just, Laurita Quint, FNP  Admit date: 03/17/2020 Discharge date: 03/18/2020  Admitted From: Home  Disposition:  Home   Recommendations for Outpatient Follow-up:  1. Follow up with PCP in 1-2 weeks       Home Health: None  Equipment/Devices: None  Discharge Condition: Fair  CODE STATUS: FULL Diet recommendation: Regular  Brief/Interim Summary: Madison Collins is a 70 year old F with HTN, chronic pain syndrome, who came in after an unresponsive episode and CPR.  Patient reports to me that she confiscated to family members bag of white powder, opened up to smell it, and may have accidentally snorted some heroin.  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  Woke with a family member performing CPR on top of her.  In the ER, the patient had a minimally elevated troponin and persistent chest pain, the hospitalist service were asked to observe overnight for chest pain.        PRINCIPAL HOSPITAL DIAGNOSIS: Costochondral, chest wall pain after accidental overdose    Discharge Diagnoses:   Costochondral chest wall pain after accidental overdose Patient reports that she accidentally ingested heroin, overdosed, and a family member perform CPR.  Afterward she had chest pain as would be expected after good, high quality CPR.  ACS was ruled out with serial troponins and EKG. She had an echocardiogram that showed hyperdynamic function, normal valves, grade 1 diastolic dysfunction which is clinically insignificant, and no regional wall motion abnormalities.  Rib x-rays were obtained that showed no rib fractures or dislocations.  Her pain was reproducible on palpation.   No further ishcemic work up.   Hypertension Continue chlorthalidone   Chronic pain Per PCP          Discharge Instructions  Discharge Instructions    Diet - low sodium heart  healthy   Complete by: As directed    Increase activity slowly   Complete by: As directed      Allergies as of 03/18/2020      Reactions   Latex Other (See Comments)   "Latex tape used during surgery - breaks my skin out"   Adhesive [tape] Rash   "per pt - surgical tape, also causes itching      Medication List    TAKE these medications   chlorthalidone 25 MG tablet Commonly known as: HYGROTON Take 1 tablet (25 mg total) by mouth daily.   citalopram 40 MG tablet Commonly known as: CELEXA Take 1 tablet (40 mg total) by mouth every morning.   diclofenac Sodium 1 % Gel Commonly known as: VOLTAREN Apply 4 g topically 4 (four) times daily.   fluticasone 50 MCG/ACT nasal spray Commonly known as: FLONASE Place 2 sprays into both nostrils daily.   gabapentin 400 MG capsule Commonly known as: NEURONTIN Take 1 capsule (400 mg total) by mouth 3 (three) times daily.   HYDROcodone-acetaminophen 10-325 MG tablet Commonly known as: NORCO Take 1 tablet by mouth every 6 (six) hours as needed. DX: CHRONIC KNEE PAIN. What changed: Another medication with the same name was removed. Continue taking this medication, and follow the directions you see here.   losartan 100 MG tablet Commonly known as: COZAAR Take 1 tablet (100 mg total) by mouth daily.   lovastatin 40 MG tablet Commonly known as: MEVACOR Take 1 tablet (40 mg total) by mouth at bedtime.   meloxicam 15 MG tablet Commonly known as: MOBIC Take 1 tablet (  15 mg total) by mouth daily.   metFORMIN 500 MG tablet Commonly known as: GLUCOPHAGE Take 1,000 mg by mouth daily with breakfast.   metFORMIN 500 MG (MOD) 24 hr tablet Commonly known as: GLUMETZA Take 2 tablets (1,000 mg total) by mouth daily with breakfast.   omeprazole 20 MG capsule Commonly known as: PRILOSEC Take 1 capsule (20 mg total) by mouth every morning.   SUMAtriptan 100 MG tablet Commonly known as: IMITREX Take 1 tablet (100 mg total) by mouth once  for 1 dose. May repeat in 2 hours if headache persists or recurs.       Allergies  Allergen Reactions  . Latex Other (See Comments)    "Latex tape used during surgery - breaks my skin out"  . Adhesive [Tape] Rash    "per pt - surgical tape, also causes itching       Procedures/Studies: DG Chest 1 View  Result Date: 03/17/2020 CLINICAL DATA:  Pain EXAM: CHEST  1 VIEW COMPARISON:  01/23/2013 FINDINGS: The heart size and mediastinal contours are within normal limits. Both lungs are clear. The visualized skeletal structures are unremarkable. IMPRESSION: No acute abnormality of the lungs in AP portable projection. Electronically Signed   By: Eddie Candle M.D.   On: 03/17/2020 17:42   DG Ribs Bilateral W/Chest  Result Date: 03/18/2020 CLINICAL DATA:  Pain.  Recent CPR EXAM: BILATERAL RIBS AND CHEST - 4+ VIEW COMPARISON:  Chest radiograph March 17, 2020 FINDINGS: Frontal chest as well as oblique and cone-down rib images bilaterally obtained. Lungs are clear. Heart size and pulmonary vascularity are normal. No adenopathy. There is upper thoracic levoscoliosis. There is also a degree of lower thoracic levoscoliosis. No pneumothorax or pleural effusion. No evident rib fracture. There are areas of degenerative change in the thoracic spine. IMPRESSION: No evident rib fracture.  No pneumothorax.  Lungs clear. Electronically Signed   By: Lowella Grip III M.D.   On: 03/18/2020 08:32   ECHOCARDIOGRAM COMPLETE  Result Date: 03/18/2020    ECHOCARDIOGRAM REPORT   Patient Name:   Madison Collins Hammer Date of Exam: 03/18/2020 Medical Rec #:  [id number]                Height:       60.0 in Accession #:    [id number]               Weight:       163.0 lb Date of Birth:  12-15-1950               BSA:          74.711 m Patient Age:    70 years                 BP:           136/60 mmHg Patient Gender: F                        HR:           78 bpm. Exam Location:  Inpatient Procedure: 2D Echo, Color Doppler  and Cardiac Doppler Indications:    R07.89 Other chest pain  History:        Patient has no prior history of Echocardiogram examinations.                 Risk Factors:Hypertension.  Sonographer:    Bernadene Person RDCS Referring Phys: [place]  1. Left ventricular  ejection fraction, by estimation, is >75%. The left ventricle has hyperdynamic function. The left ventricle has no regional wall motion abnormalities. There is mild left ventricular hypertrophy. Left ventricular diastolic parameters are consistent with Grade I diastolic dysfunction (impaired relaxation).  2. Right ventricular systolic function is normal. The right ventricular size is normal.  3. The mitral valve is normal in structure. Trivial mitral valve regurgitation. No evidence of mitral stenosis.  4. The aortic valve is tricuspid. Aortic valve regurgitation is trivial. No aortic stenosis is present.  5. The inferior vena cava is normal in size with greater than 50% respiratory variability, suggesting right atrial pressure of 3 mmHg. FINDINGS  Left Ventricle: Left ventricular ejection fraction, by estimation, is >75%. The left ventricle has hyperdynamic function. The left ventricle has no regional wall motion abnormalities. The left ventricular internal cavity size was normal in size. There is mild left ventricular hypertrophy. Left ventricular diastolic parameters are consistent with Grade I diastolic dysfunction (impaired relaxation). Right Ventricle: The right ventricular size is normal.Right ventricular systolic function is normal. Left Atrium: Left atrial size was normal in size. Right Atrium: Right atrial size was normal in size. Pericardium: There is no evidence of pericardial effusion. Mitral Valve: The mitral valve is normal in structure. Mild mitral annular calcification. Trivial mitral valve regurgitation. No evidence of mitral valve stenosis. Tricuspid Valve: The tricuspid valve is normal in structure. Tricuspid  valve regurgitation is trivial. No evidence of tricuspid stenosis. Aortic Valve: The aortic valve is tricuspid. Aortic valve regurgitation is trivial. Aortic regurgitation PHT measures 570 msec. No aortic stenosis is present. Pulmonic Valve: The pulmonic valve was normal in structure. Pulmonic valve regurgitation is not visualized. No evidence of pulmonic stenosis. Aorta: The aortic root is normal in size and structure. Venous: The inferior vena cava is normal in size with greater than 50% respiratory variability, suggesting right atrial pressure of 3 mmHg. IAS/Shunts: No atrial level shunt detected by color flow Doppler.  LEFT VENTRICLE PLAX 2D LVIDd:         4.00 cm  Diastology LVIDs:         2.30 cm  LV e' medial:    6.42 cm/s LV PW:         1.00 cm  LV E/e' medial:  10.3 LV IVS:        1.20 cm  LV e' lateral:   7.83 cm/s LVOT diam:     1.90 cm  LV E/e' lateral: 8.4 LV SV:         81 LV SV Index:   47 LVOT Area:     2.84 cm  RIGHT VENTRICLE RV S prime:     18.50 cm/s TAPSE (M-mode): 2.3 cm LEFT ATRIUM             Index       RIGHT ATRIUM           Index LA diam:        2.70 cm 1.58 cm/m  RA Area:     11.70 cm LA Vol (A2C):   40.5 ml 23.67 ml/m RA Volume:   26.30 ml  15.37 ml/m LA Vol (A4C):   30.1 ml 17.59 ml/m LA Biplane Vol: 37.2 ml 21.74 ml/m  AORTIC VALVE LVOT Vmax:   146.00 cm/s LVOT Vmean:  99.200 cm/s LVOT VTI:    0.285 m AI PHT:      570 msec  AORTA Ao Root diam: 3.00 cm Ao Asc diam:  2.90 cm MITRAL VALVE MV  Area (PHT): 3.77 cm    SHUNTS MV Decel Time: 201 msec    Systemic VTI:  0.29 m MV E velocity: 66.00 cm/s  Systemic Diam: 1.90 cm MV A velocity: 74.10 cm/s MV E/A ratio:  0.89 Kirk Ruths MD Electronically signed by Kirk Ruths MD Signature Date/Time: 03/18/2020/1:34:46 PM    Final        Subjective: She still has some discomfort on the right side of her sternum, worse with palpation.  She has no dyspnea, orthopnea, palpitations, confusion.  Discharge Exam: Vitals:   03/18/20  0745 03/18/20 1237  BP: 136/60 (!) 156/60  Pulse: 74 80  Resp: 19 15  Temp: 98.1 F (36.7 C) 98.1 F (36.7 C)  SpO2: 96% 98%   Vitals:   03/17/20 1653 03/17/20 2243 03/18/20 0745 03/18/20 1237  BP:  (!) 156/72 136/60 (!) 156/60  Pulse:  79 74 80  Resp:  14 19 15   Temp:   98.1 F (36.7 C) 98.1 F (36.7 C)  TempSrc:   Oral Oral  SpO2:  100% 96% 98%  Weight: 73.9 kg     Height: 5' (1.524 m)       General: Pt is alert, awake, not in acute distress Cardiovascular: RRR, nl S1-S2, no murmurs appreciated.   No LE edema.  Chest tender to palpation. Respiratory: Normal respiratory rate and rhythm.  CTAB without rales or wheezes. Abdominal: Abdomen soft and non-tender.  No distension or HSM.   Neuro/Psych: Strength symmetric in upper and lower extremities.  Judgment and insight appear normal.   The results of significant diagnostics from this hospitalization (including imaging, microbiology, ancillary and laboratory) are listed below for reference.     Microbiology: Recent Results (from the past 240 hour(s))  SARS Coronavirus 2 by RT PCR (hospital order, performed in [facility] hospital lab) Nasopharyngeal Nasopharyngeal Swab     Status: None   Collection Time: 03/17/20 10:29 PM   Specimen: Nasopharyngeal Swab  Result Value Ref Range Status   SARS Coronavirus 2 NEGATIVE NEGATIVE Final    Comment: (NOTE) SARS-CoV-2 target nucleic acids are NOT DETECTED.  The SARS-CoV-2 RNA is generally detectable in upper and lower respiratory specimens during the acute phase of infection. The lowest concentration of SARS-CoV-2 viral copies this assay can detect is 250 copies / mL. A negative result does not preclude SARS-CoV-2 infection and should not be used as the sole basis for treatment or other patient management decisions.  A negative result may occur with improper specimen collection / handling, submission of specimen other than nasopharyngeal swab, presence of viral mutation(s) within  the areas targeted by this assay, and inadequate number of viral copies (<250 copies / mL). A negative result must be combined with clinical observations, patient history, and epidemiological information.  Fact Sheet for Patients:   StrictlyIdeas.no  Fact Sheet for Healthcare Providers: BankingDealers.co.za  This test is not yet approved or  cleared by the [place] FDA and has been authorized for detection and/or diagnosis of SARS-CoV-2 by FDA under an Emergency Use Authorization (EUA).  This EUA will remain in effect (meaning this test can be used) for the duration of the COVID-19 declaration under Section 564(b)(1) of the Act, 21 U.S.C. section 360bbb-3(b)(1), unless the authorization is terminated or revoked sooner.  Performed at [facility], [place] [address], [place], [place] [address]      Labs: BNP (last 3 results) No results for input(s): BNP in the last 8760 hours. Basic Metabolic Panel: Recent Labs  Lab 03/17/20 1652  NA 135  K 3.7  CL 99  CO2 24  GLUCOSE 135*  BUN 30*  CREATININE 0.90  CALCIUM 9.3   Liver Function Tests: Recent Labs  Lab 03/17/20 1652  AST 19  ALT 20  ALKPHOS 86  BILITOT 0.6  PROT 7.8  ALBUMIN 4.5   No results for input(s): LIPASE, AMYLASE in the last 168 hours. No results for input(s): AMMONIA in the last 168 hours. CBC: Recent Labs  Lab 03/17/20 1652  WBC 16.3*  HGB 12.3  HCT 36.8  MCV 88.5  PLT 316   Cardiac Enzymes: No results for input(s): CKTOTAL, CKMB, CKMBINDEX, TROPONINI in the last 168 hours. BNP: Invalid input(s): POCBNP CBG: Recent Labs  Lab 03/17/20 1900  GLUCAP 116*   D-Dimer No results for input(s): DDIMER in the last 72 hours. Hgb A1c No results for input(s): HGBA1C in the last 72 hours. Lipid Profile No results for input(s): CHOL, HDL, LDLCALC, TRIG, CHOLHDL, LDLDIRECT in the last 72 hours. Thyroid function studies No results  for input(s): TSH, T4TOTAL, T3FREE, THYROIDAB in the last 72 hours.  Invalid input(s): FREET3 Anemia work up No results for input(s): VITAMINB12, FOLATE, FERRITIN, TIBC, IRON, RETICCTPCT in the last 72 hours. Urinalysis    Component Value Date/Time   APPEARANCEUR Clear 06/21/2017 0939   GLUCOSEU Negative 06/21/2017 0939   BILIRUBINUR Negative 06/21/2017 0939   KETONESUR negative 09/09/2015 1015   PROTEINUR Negative 06/21/2017 0939   UROBILINOGEN 0.2 09/09/2015 1015   NITRITE Negative 06/21/2017 0939   LEUKOCYTESUR Negative 06/21/2017 0939   Sepsis Labs Invalid input(s): PROCALCITONIN,  WBC,  LACTICIDVEN Microbiology Recent Results (from the past 240 hour(s))  SARS Coronavirus 2 by RT PCR (hospital order, performed in [facility] hospital lab) Nasopharyngeal Nasopharyngeal Swab     Status: None   Collection Time: 03/17/20 10:29 PM   Specimen: Nasopharyngeal Swab  Result Value Ref Range Status   SARS Coronavirus 2 NEGATIVE NEGATIVE Final    Comment: (NOTE) SARS-CoV-2 target nucleic acids are NOT DETECTED.  The SARS-CoV-2 RNA is generally detectable in upper and lower respiratory specimens during the acute phase of infection. The lowest concentration of SARS-CoV-2 viral copies this assay can detect is 250 copies / mL. A negative result does not preclude SARS-CoV-2 infection and should not be used as the sole basis for treatment or other patient management decisions.  A negative result may occur with improper specimen collection / handling, submission of specimen other than nasopharyngeal swab, presence of viral mutation(s) within the areas targeted by this assay, and inadequate number of viral copies (<250 copies / mL). A negative result must be combined with clinical observations, patient history, and epidemiological information.  Fact Sheet for Patients:   StrictlyIdeas.no  Fact Sheet for Healthcare  Providers: BankingDealers.co.za  This test is not yet approved or  cleared by the [place] FDA and has been authorized for detection and/or diagnosis of SARS-CoV-2 by FDA under an Emergency Use Authorization (EUA).  This EUA will remain in effect (meaning this test can be used) for the duration of the COVID-19 declaration under Section 564(b)(1) of the Act, 21 U.S.C. section 360bbb-3(b)(1), unless the authorization is terminated or revoked sooner.  Performed at [facility], [place] [address], [place], [place] [address]      Time coordinating discharge: 30 minutes The [place] controlled substances registry was reviewed for this patient      SIGNED:   Edwin Dada, MD  [facility] 03/18/2020, 1:53  PM

## 2020-03-18 NOTE — H&P (Signed)
History and Physical    Madison Collins [id number] DOB: 06-03-1950 DOA: 03/17/2020  PCP: Just, Laurita Quint, FNP  Patient coming from: Home  I have personally briefly reviewed patient's old medical records in [place]  Chief Complaint: OD, heroin  HPI: Madison Collins is a 70 y.o. female with medical history significant of HTN, HCV, chronic pain syndrome.  Today pt tried heroin for the first time, snorted it.  After this pt was cleaning bird cage, had syncope, and woke up to boyfriend doing CPR on her.  Pt now has CP, onset when she woke up, persistent.  Denies fever or cough, no vomiting nor diarrhea.   ED Course: Trops: 26, 41, 47 in ED.  EKG without acute ischemic findings.   Review of Systems: As per HPI, otherwise all review of systems negative.  Past Medical History:  Diagnosis Date  . Anxiety   . Anxiety and depression   . Arthritis   . Bulging disc 1990's   c4-5  . Cataract    right eye  . Depression   . GERD (gastroesophageal reflux disease)   . H/O: osteoarthritis   . Headache    migraines occasionally  . Hepatitis C    IA viral load low 02/09    BX past mild fibrosis  . Hypertension   . Insomnia, unspecified   . Knee pain   . Post-menopause 2010    Past Surgical History:  Procedure Laterality Date  . BREAST BIOPSY Left   . BUNIONECTOMY Right 1983  . COLONOSCOPY  10/22/13  . EYE SURGERY Right 2010   hole in macula  . KNEE SURGERY Right     x4  . ORIF ELBOW FRACTURE Right 04/08/2015   Procedure: OPEN REDUCTION INTERNAL FIXATION (ORIF) ELBOW/OLECRANON FRACTURE;  Surgeon: Milly Jakob, MD;  Location: [facility];  Service: Orthopedics;  Laterality: Right;  . ORIF RADIAL FRACTURE Right 04/08/2015   Procedure: OPEN REDUCTION INTERNAL FIXATION (ORIF) RADIAL HEAD ;  Surgeon: Milly Jakob, MD;  Location: [facility];  Service: Orthopedics;  Laterality: Right;  . OVARIAN CYST REMOVAL    . TOTAL KNEE ARTHROPLASTY Right 12/24/2013    Procedure: RIGHT TOTAL KNEE ARTHROPLASTY;  Surgeon: Kerin Salen, MD;  Location: [facility];  Service: Orthopedics;  Laterality: Right;  . TUBAL LIGATION  1987     reports that she has never smoked. She has never used smokeless tobacco. She reports current alcohol use. She reports current drug use.  Allergies  Allergen Reactions  . Latex Other (See Comments)    "Latex tape used during surgery - breaks my skin out"  . Adhesive [Tape] Rash    "per pt - surgical tape, also causes itching    Family History  Problem Relation Age of Onset  . Cancer Mother        Lung with brain mets  . Diabetes Mother   . Stroke Father 72  . Diabetes Father   . Colon cancer Father 58       colorectal/anal cancer  . Hypertension Brother   . Bipolar disorder Daughter   . Bipolar disorder Son   . Diabetes Maternal Grandfather   . Heart disease Maternal Grandfather   . Emphysema Paternal Grandmother   . Cancer Paternal Grandfather        stomach  . Breast cancer Maternal Aunt      Prior to Admission medications   Medication Sig Start Date End Date Taking? Authorizing Provider  chlorthalidone (HYGROTON) 25 MG tablet  Take 1 tablet (25 mg total) by mouth daily. 02/05/20   Just, Laurita Quint, FNP  citalopram (CELEXA) 40 MG tablet Take 1 tablet (40 mg total) by mouth every morning. 02/20/20   Just, Laurita Quint, FNP  diclofenac Sodium (VOLTAREN) 1 % GEL Apply 4 g topically 4 (four) times daily. 01/05/19   Jacelyn Pi, Lilia Argue, MD  fluticasone [facility]) 50 MCG/ACT nasal spray Place 2 sprays into both nostrils daily. 07/10/19   Daleen Squibb, MD  gabapentin (NEURONTIN) 400 MG capsule Take 1 capsule (400 mg total) by mouth 3 (three) times daily. 01/29/20   Just, Laurita Quint, FNP  HYDROcodone-acetaminophen (NORCO) 10-325 MG tablet Take 1 tablet by mouth every 6 (six) hours as needed. Ok to fill 60 days after signature 11/19/19   Jacelyn Pi, Lilia Argue, MD  HYDROcodone-acetaminophen [facility]) 10-325 MG tablet Take 1  tablet by mouth every 6 (six) hours as needed. Dx chronic knee pain 11/19/19   Jacelyn Pi, Lilia Argue, MD  HYDROcodone-acetaminophen [facility]) 10-325 MG tablet Take 1 tablet by mouth every 6 (six) hours as needed. DX: CHRONIC KNEE PAIN. 03/14/20   Just, Laurita Quint, FNP  losartan (COZAAR) 100 MG tablet Take 1 tablet (100 mg total) by mouth daily. 01/29/20   Just, Laurita Quint, FNP  lovastatin (MEVACOR) 40 MG tablet Take 1 tablet (40 mg total) by mouth at bedtime. 07/26/19   Jacelyn Pi, Lilia Argue, MD  meloxicam (MOBIC) 15 MG tablet Take 1 tablet (15 mg total) by mouth daily. 01/29/20   Just, Laurita Quint, FNP  metFORMIN (GLUMETZA) 500 MG (MOD) 24 hr tablet Take 2 tablets (1,000 mg total) by mouth daily with breakfast. 01/30/20   Just, Laurita Quint, FNP  omeprazole (PRILOSEC) 20 MG capsule Take 1 capsule (20 mg total) by mouth every morning. 07/10/19   Jacelyn Pi, Lilia Argue, MD  SUMAtriptan (IMITREX) 100 MG tablet Take 1 tablet (100 mg total) by mouth once for 1 dose. May repeat in 2 hours if headache persists or recurs. 07/10/19 07/10/19  Daleen Squibb, MD    Physical Exam: Vitals:   03/17/20 1647 03/17/20 1651 03/17/20 1653 03/17/20 2243  BP:  (!) 149/58  (!) 156/72  Pulse:  85  79  Resp:  12  14  Temp:  (!) 97.5 F (36.4 C)    TempSrc:  Oral    SpO2: 98% 98%  100%  Weight:   73.9 kg   Height:   5' (1.524 m)     Constitutional: NAD, calm, comfortable Eyes: PERRL, lids and conjunctivae normal ENMT: Mucous membranes are moist. Posterior pharynx clear of any exudate or lesions.Normal dentition.  Neck: normal, supple, no masses, no thyromegaly Respiratory: clear to auscultation bilaterally, no wheezing, no crackles. Normal respiratory effort. No accessory muscle use.  Cardiovascular: Regular rate and rhythm, no murmurs / rubs / gallops. No extremity edema. 2+ pedal pulses. No carotid bruits.  Abdomen: no tenderness, no masses palpated. No hepatosplenomegaly. Bowel sounds positive.  Musculoskeletal: no  clubbing / cyanosis. No joint deformity upper and lower extremities. Good ROM, no contractures. Normal muscle tone.  Skin: no rashes, lesions, ulcers. No induration Neurologic: CN 2-12 grossly intact. Sensation intact, DTR normal. Strength 5/5 in all 4.  Psychiatric: Normal judgment and insight. Alert and oriented x 3. Normal mood.    Labs on Admission: I have personally reviewed following labs and imaging studies  CBC: Recent Labs  Lab 03/17/20 1652  WBC 16.3*  HGB 12.3  HCT 36.8  MCV 88.5  PLT 297   Basic Metabolic Panel: Recent Labs  Lab 03/17/20 1652  NA 135  K 3.7  CL 99  CO2 24  GLUCOSE 135*  BUN 30*  CREATININE 0.90  CALCIUM 9.3   GFR: Estimated Creatinine Clearance: 54.5 mL/min (by C-G formula based on SCr of 0.9 mg/dL). Liver Function Tests: Recent Labs  Lab 03/17/20 1652  AST 19  ALT 20  ALKPHOS 86  BILITOT 0.6  PROT 7.8  ALBUMIN 4.5   No results for input(s): LIPASE, AMYLASE in the last 168 hours. No results for input(s): AMMONIA in the last 168 hours. Coagulation Profile: No results for input(s): INR, PROTIME in the last 168 hours. Cardiac Enzymes: No results for input(s): CKTOTAL, CKMB, CKMBINDEX, TROPONINI in the last 168 hours. BNP (last 3 results) No results for input(s): PROBNP in the last 8760 hours. HbA1C: No results for input(s): HGBA1C in the last 72 hours. CBG: Recent Labs  Lab 03/17/20 1900  GLUCAP 116*   Lipid Profile: No results for input(s): CHOL, HDL, LDLCALC, TRIG, CHOLHDL, LDLDIRECT in the last 72 hours. Thyroid Function Tests: No results for input(s): TSH, T4TOTAL, FREET4, T3FREE, THYROIDAB in the last 72 hours. Anemia Panel: No results for input(s): VITAMINB12, FOLATE, FERRITIN, TIBC, IRON, RETICCTPCT in the last 72 hours. Urine analysis:    Component Value Date/Time   APPEARANCEUR Clear 06/21/2017 0939   GLUCOSEU Negative 06/21/2017 0939   BILIRUBINUR Negative 06/21/2017 0939   KETONESUR negative 09/09/2015 1015    PROTEINUR Negative 06/21/2017 0939   UROBILINOGEN 0.2 09/09/2015 1015   NITRITE Negative 06/21/2017 0939   LEUKOCYTESUR Negative 06/21/2017 0939    Radiological Exams on Admission: DG Chest 1 View  Result Date: 03/17/2020 CLINICAL DATA:  Pain EXAM: CHEST  1 VIEW COMPARISON:  01/23/2013 FINDINGS: The heart size and mediastinal contours are within normal limits. Both lungs are clear. The visualized skeletal structures are unremarkable. IMPRESSION: No acute abnormality of the lungs in AP portable projection. Electronically Signed   By: Eddie Candle M.D.   On: 03/17/2020 17:42    EKG: Independently reviewed.  Sinus rhythm, no obvious acute ischemic changes.  Assessment/Plan Principal Problem:   Heroin overdose, accidental or unintentional, initial encounter [facility]) Active Problems:   Chest pain, rule out acute myocardial infarction    1. Heroin OD - 1. Worn off at this point 2. Avoid narcotics for the moment. 2. CP r/o - 1. Does have trop elevation 2. No ischemic findings on EKG 3. Suspect cracked rib(s) and maybe cardiac contusion from CPR 4. ACS seems less likely 5. CP obs pathway 6. Tele monitor 7. Check 2d echo in AM  DVT prophylaxis: Lovenox Code Status: Full Family Communication: Do not discuss with family per patient Disposition Plan: Home after CP r/o Consults called: None Admission status: Place in 33    Kamen Hanken, [facility]  How to contact the [facility] Attending or Consulting provider [place] or covering provider during after hours [place], for this patient?  1. Check the care team in [facility] and look for a) attending/consulting [facility] provider listed and b) the [facility] team listed 2. Log into [email or web address]  Amion Physician Scheduling and messaging for groups and whole hospitals  On call and physician scheduling software for group practices, residents, hospitalists and other medical providers for call, clinic, rotation and shift schedules. On[employer]  is a hospital-wide system for scheduling doctors and paging doctors on call. EasyPlot is for scientific plotting and data analysis.  [email or web address]  and use [facility]'s universal password to access. If you do not have the password, please contact the hospital operator.  3. Locate the [facility] provider you are looking for under [facility] and page to a number that you can be directly reached. 4. If you still have difficulty reaching the provider, please page the [facility] (Director on Call) for the Hospitalists listed on amion for assistance.  03/18/2020, 12:01 AM

## 2020-03-24 ENCOUNTER — Encounter: Payer: Self-pay | Admitting: Family Medicine

## 2020-03-24 ENCOUNTER — Other Ambulatory Visit: Payer: Self-pay

## 2020-03-24 ENCOUNTER — Ambulatory Visit (INDEPENDENT_AMBULATORY_CARE_PROVIDER_SITE_OTHER): Payer: PPO | Admitting: Family Medicine

## 2020-03-24 VITALS — BP 171/71 | HR 78 | Temp 98.1°F | Ht 60.0 in | Wt 164.0 lb

## 2020-03-24 DIAGNOSIS — I10 Essential (primary) hypertension: Secondary | ICD-10-CM | POA: Diagnosis not present

## 2020-03-24 DIAGNOSIS — Z09 Encounter for follow-up examination after completed treatment for conditions other than malignant neoplasm: Secondary | ICD-10-CM | POA: Diagnosis not present

## 2020-03-24 NOTE — Patient Instructions (Addendum)
  Health Maintenance After Age 68 After age 68, you are at a higher risk for certain long-term diseases and infections as well as injuries from falls. Falls are a major cause of broken bones and head injuries in people who are older than age 68. Getting regular preventive care can help to keep you healthy and well. Preventive care includes getting regular testing and making lifestyle changes as recommended by your health care provider. Talk with your health care provider about:  Which screenings and tests you should have. A screening is a test that checks for a disease when you have no symptoms.  A diet and exercise plan that is right for you. What should I know about screenings and tests to prevent falls? Screening and testing are the best ways to find a health problem early. Early diagnosis and treatment give you the best chance of managing medical conditions that are common after age 68. Certain conditions and lifestyle choices may make you more likely to have a fall. Your health care provider may recommend:  Regular vision checks. Poor vision and conditions such as cataracts can make you more likely to have a fall. If you wear glasses, make sure to get your prescription updated if your vision changes.  Medicine review. Work with your health care provider to regularly review all of the medicines you are taking, including over-the-counter medicines. Ask your health care provider about any side effects that may make you more likely to have a fall. Tell your health care provider if any medicines that you take make you feel dizzy or sleepy.  Osteoporosis screening. Osteoporosis is a condition that causes the bones to get weaker. This can make the bones weak and cause them to break more easily.  Blood pressure screening. Blood pressure changes and medicines to control blood pressure can make you feel dizzy.  Strength and balance checks. Your health care provider may recommend certain tests to  check your strength and balance while standing, walking, or changing positions.  Foot health exam. Foot pain and numbness, as well as not wearing proper footwear, can make you more likely to have a fall.  Depression screening. You may be more likely to have a fall if you have a fear of falling, feel emotionally low, or feel unable to do activities that you used to do.  Alcohol use screening. Using too much alcohol can affect your balance and may make you more likely to have a fall. What actions can I take to lower my risk of falls? General instructions  Talk with your health care provider about your risks for falling. Tell your health care provider if: ? You fall. Be sure to tell your health care provider about all falls, even ones that seem minor. ? You feel dizzy, sleepy, or off-balance.  Take over-the-counter and prescription medicines only as told by your health care provider. These include any supplements.  Eat a healthy diet and maintain a healthy weight. A healthy diet includes low-fat dairy products, low-fat (lean) meats, and fiber from whole grains, beans, and lots of fruits and vegetables. Home safety  Remove any tripping hazards, such as rugs, cords, and clutter.  Install safety equipment such as grab bars in bathrooms and safety rails on stairs.  Keep rooms and walkways well-lit. Activity  Follow a regular exercise program to stay fit. This will help you maintain your balance. Ask your health care provider what types of exercise are appropriate for you.  If you need a cane   or walker, use it as recommended by your health care provider.  Wear supportive shoes that have nonskid soles.   Lifestyle  Do not drink alcohol if your health care provider tells you not to drink.  If you drink alcohol, limit how much you have: ? 0-1 drink a day for women. ? 0-2 drinks a day for men.  Be aware of how much alcohol is in your drink. In the [place] one drink equals one typical bottle  of beer (12 oz), one-half glass of wine (5 oz), or one shot of hard liquor (1 oz).  Do not use any products that contain nicotine or tobacco, such as cigarettes and e-cigarettes. If you need help quitting, ask your health care provider. Summary  Having a healthy lifestyle and getting preventive care can help to protect your health and wellness after age 68.  Screening and testing are the best way to find a health problem early and help you avoid having a fall. Early diagnosis and treatment give you the best chance for managing medical conditions that are more common for people who are older than age 68.  Falls are a major cause of broken bones and head injuries in people who are older than age 68. Take precautions to prevent a fall at home.  Work with your health care provider to learn what changes you can make to improve your health and wellness and to prevent falls. This information is not intended to replace advice given to you by your health care provider. Make sure you discuss any questions you have with your health care provider. Document Revised: 06/01/2018 Document Reviewed: 12/22/2016 Elsevier Patient Education  2021 Elsevier Inc.   If you have lab work done today you will be contacted with your lab results within the next 2 weeks.  If you have not heard from us then please contact us. The fastest way to get your results is to register for My Chart.   IF you received an x-ray today, you will receive an invoice from [facility]. Please contact [facility] at [phone] with questions or concerns regarding your invoice.   IF you received labwork today, you will receive an invoice from [facility]. Please contact [facility] at [phone] with questions or concerns regarding your invoice.   Our billing staff will not be able to assist you with questions regarding bills from these companies.  You will be contacted with the lab results as soon as they are available.  The fastest way to get your results is to activate your My Chart account. Instructions are located on the last page of this paperwork. If you have not heard from us regarding the results in 2 weeks, please contact this office.      

## 2020-03-24 NOTE — Progress Notes (Signed)
1/31/20223:12 PM  Madison Collins 11/26/1950, 70 y.o., female [id number]  Chief Complaint  Patient presents with  . chest pain center to outer chest areas     States possibly from chest compressions   . runny nose and some cough    Previous RSV   . hospitall follow up     Accidental overdose    HPI:   Patient is a 70 y.o. female with past medical history significant for chronic pain who presents today for hospital follow up.  Recently seen in ED and admitted overnight for accidental heroin overdose BP performed CPR  Was recently evicted, living in a motel for the past 4 weeks Trying to continue to care for 5 grandkids and one daughter Had taken the "powder" from a friend Smelled it from the bag and accidentally inhaled some Woke up after BF did CPR on her EMS gave narcan No rib fx found on CXR Denies issues with drugs Gave the rest to EMS Is embarrassed by the situation Denies thoughts of suicide Had been sick with RSV Understands with complications with this [place] Only has money from social security  Hasn't taken BP medications today BP Readings from Last 3 Encounters:  03/24/20 (!) 171/71  03/18/20 (!) 156/60  01/29/20 (!) 151/78      Depression screen PHQ 2/9 03/24/2020 01/29/2020 07/10/2019  Decreased Interest 0 0 0  Down, Depressed, Hopeless 0 0 1  PHQ - 2 Score 0 0 1  Altered sleeping - - 1  Tired, decreased energy - - 1  Change in appetite - - 1  Feeling bad or failure about yourself  - - 0  Trouble concentrating - - 0  Moving slowly or fidgety/restless - - 0  Suicidal thoughts - - 0  PHQ-9 Score - - 4  Difficult doing work/chores - - -  Some recent data might be hidden    Fall Risk  03/24/2020 01/29/2020 07/10/2019 04/06/2019 03/08/2019  Falls in the past year? 1 1 0 0 0  Number falls in past yr: 0 0 0 0 0  Comment - - - - -  Injury with Fall? 0 1 0 0 0  Risk Factor Category  - -  - - -  Risk for fall due to : - - - - -  Follow up Falls evaluation completed Falls evaluation completed - - Falls evaluation completed;Education provided     Allergies  Allergen Reactions  . Latex Other (See Comments)    "Latex tape used during surgery - breaks my skin out"  . Adhesive [Tape] Rash    "per pt - surgical tape, also causes itching    Prior to Admission medications   Medication Sig Start Date End Date Taking? Authorizing Provider  benzonatate (TESSALON PERLES) 100 MG capsule Take 1 capsule (100 mg total) by mouth every 6 (six) hours as needed for cough. 03/18/20 03/18/21 Yes Danford, Suann Larry, MD  chlorthalidone (HYGROTON) 25 MG tablet Take 1 tablet (25 mg total) by mouth daily. 02/05/20  Yes Arilynn Blakeney, Laurita Quint, FNP  citalopram (CELEXA) 40 MG tablet Take 1 tablet (40 mg total) by mouth every morning. 02/20/20  Yes Ajla Mcgeachy, Laurita Quint, FNP  diclofenac Sodium (VOLTAREN) 1 % GEL Apply 4 g topically 4 (four) times daily. 01/05/19  Yes Jacelyn Pi, Irma M, MD  fluticasone [facility]) 50 MCG/ACT nasal spray Place 2 sprays into both nostrils daily. 07/10/19  Yes Jacelyn Pi,  Lilia Argue, MD  gabapentin (NEURONTIN) 400 MG capsule Take 1 capsule (400 mg total) by mouth 3 (three) times daily. 01/29/20  Yes Joleah Kosak, Laurita Quint, FNP  HYDROcodone-acetaminophen (NORCO) 10-325 MG tablet Take 1 tablet by mouth every 6 (six) hours as needed. DX: CHRONIC KNEE PAIN. 03/14/20  Yes Ayleah Hofmeister, Laurita Quint, FNP  losartan (COZAAR) 100 MG tablet Take 1 tablet (100 mg total) by mouth daily. 01/29/20  Yes Deana Krock, Laurita Quint, FNP  lovastatin (MEVACOR) 40 MG tablet Take 1 tablet (40 mg total) by mouth at bedtime. 07/26/19  Yes Jacelyn Pi, Lilia Argue, MD  meloxicam (MOBIC) 15 MG tablet Take 1 tablet (15 mg total) by mouth daily. 01/29/20  Yes Krystin Keeven, Laurita Quint, FNP  metFORMIN (GLUCOPHAGE) 500 MG tablet Take 1,000 mg by mouth daily with breakfast.   Yes [provider]  metFORMIN (GLUMETZA) 500 MG (MOD) 24 hr tablet Take 2 tablets  (1,000 mg total) by mouth daily with breakfast. 01/30/20  Yes Lester Crickenberger, Laurita Quint, FNP  omeprazole (PRILOSEC) 20 MG capsule Take 1 capsule (20 mg total) by mouth every morning. 07/10/19  Yes Jacelyn Pi, Lilia Argue, MD  SUMAtriptan (IMITREX) 100 MG tablet Take 1 tablet (100 mg total) by mouth once for 1 dose. May repeat in 2 hours if headache persists or recurs. 07/10/19 07/10/19 Yes Daleen Squibb, MD    Past Medical History:  Diagnosis Date  . Anxiety   . Anxiety and depression   . Arthritis   . Bulging disc 1990's   c4-5  . Cataract    right eye  . Depression   . GERD (gastroesophageal reflux disease)   . H/O: osteoarthritis   . Headache    migraines occasionally  . Hepatitis C    IA viral load low 02/09    BX past mild fibrosis  . Hypertension   . Insomnia, unspecified   . Knee pain   . Post-menopause 2010    Past Surgical History:  Procedure Laterality Date  . BREAST BIOPSY Left   . BUNIONECTOMY Right 1983  . COLONOSCOPY  10/22/13  . EYE SURGERY Right 2010   hole in macula  . KNEE SURGERY Right     x4  . ORIF ELBOW FRACTURE Right 04/08/2015   Procedure: OPEN REDUCTION INTERNAL FIXATION (ORIF) ELBOW/OLECRANON FRACTURE;  Surgeon: Milly Jakob, MD;  Location: [facility];  Service: Orthopedics;  Laterality: Right;  . ORIF RADIAL FRACTURE Right 04/08/2015   Procedure: OPEN REDUCTION INTERNAL FIXATION (ORIF) RADIAL HEAD ;  Surgeon: Milly Jakob, MD;  Location: [facility];  Service: Orthopedics;  Laterality: Right;  . OVARIAN CYST REMOVAL    . TOTAL KNEE ARTHROPLASTY Right 12/24/2013   Procedure: RIGHT TOTAL KNEE ARTHROPLASTY;  Surgeon: Kerin Salen, MD;  Location: [facility];  Service: Orthopedics;  Laterality: Right;  . TUBAL LIGATION  1987    Social History   Tobacco Use  . Smoking status: Never Smoker  . Smokeless tobacco: Never Used  Substance Use Topics  . Alcohol use: Yes    Comment: a glass of wine/rarely    Family History  Problem Relation Age of Onset  . Cancer Mother         Lung with brain mets  . Diabetes Mother   . Stroke Father 20  . Diabetes Father   . Colon cancer Father 59       colorectal/anal cancer  . Hypertension Brother   . Bipolar disorder Daughter   . Bipolar disorder Son   .  Diabetes Maternal Grandfather   . Heart disease Maternal Grandfather   . Emphysema Paternal Grandmother   . Cancer Paternal Grandfather        stomach  . Breast cancer Maternal Aunt     Review of Systems  Constitutional: Negative for chills, fever and malaise/fatigue.  Eyes: Negative for blurred vision and double vision.  Respiratory: Negative for cough, shortness of breath and wheezing.   Cardiovascular: Positive for chest pain. Negative for palpitations and leg swelling.  Gastrointestinal: Negative for abdominal pain, blood in stool, constipation, diarrhea, heartburn, nausea and vomiting.  Genitourinary: Negative for dysuria, frequency and hematuria.  Musculoskeletal: Negative for back pain and joint pain.  Skin: Negative for rash.  Neurological: Negative for dizziness, weakness and headaches.    OBJECTIVE:  Today's Vitals   03/24/20 1441  BP: (!) 171/71  Pulse: 78  Temp: 98.1 F (36.7 C)  SpO2: 99%  Weight: 164 lb (74.4 kg)  Height: 5' (1.524 m)   Body mass index is 32.03 kg/m.   Physical Exam Constitutional:      General: She is not in acute distress.    Appearance: Normal appearance. She is not ill-appearing.  HENT:     Head: Normocephalic.  Cardiovascular:     Rate and Rhythm: Normal rate and regular rhythm.     Pulses: Normal pulses.     Heart sounds: Normal heart sounds. No murmur heard. No friction rub. No gallop.   Pulmonary:     Effort: Pulmonary effort is normal. No respiratory distress.     Breath sounds: Normal breath sounds. No stridor. No wheezing, rhonchi or rales.  Chest:     Chest wall: Tenderness present.  Abdominal:     General: Bowel sounds are normal.     Palpations: Abdomen is soft.     Tenderness: There is  no abdominal tenderness.  Musculoskeletal:     Right lower leg: No edema.     Left lower leg: No edema.  Skin:    General: Skin is warm and dry.  Neurological:     Mental Status: She is alert and oriented to person, place, and time.  Psychiatric:        Mood and Affect: Mood normal.        Behavior: Behavior normal.     No results found for this or any previous visit (from the past 24 hour(s)).  No results found.   ASSESSMENT and PLAN  Problem List Items Addressed This Visit      Cardiovascular and Mediastinum   Essential hypertension    Other Visit Diagnoses    Hospital discharge follow-up    -  Primary      Plan . Discussed compliance of BP medications . Discussed if this happens again or if have an abnormal drug screen will have to stop prescribing narcotics, she agreed in understanding . Next appt 2/22 . RTC/ED precautions provided   Return if symptoms worsen or fail to improve, for Regularly scheduled appointment.    Huston Foley Itsel Opfer, FNP-BC [facility] [place], [place] [address] Ph.  [phone] Fax [phone]

## 2020-04-05 ENCOUNTER — Other Ambulatory Visit: Payer: Self-pay | Admitting: Family Medicine

## 2020-04-05 DIAGNOSIS — M1711 Unilateral primary osteoarthritis, right knee: Secondary | ICD-10-CM

## 2020-04-05 DIAGNOSIS — G8929 Other chronic pain: Secondary | ICD-10-CM

## 2020-04-07 MED ORDER — HYDROCODONE-ACETAMINOPHEN 10-325 MG PO TABS: 1.0000 | ORAL_TABLET | Freq: Four times a day (QID) | ORAL | 0 refills | Status: DC | PRN

## 2020-04-15 ENCOUNTER — Encounter: Payer: Self-pay | Admitting: Family Medicine

## 2020-04-15 ENCOUNTER — Other Ambulatory Visit: Payer: Self-pay

## 2020-04-15 ENCOUNTER — Ambulatory Visit (INDEPENDENT_AMBULATORY_CARE_PROVIDER_SITE_OTHER): Payer: PPO | Admitting: Family Medicine

## 2020-04-15 VITALS — BP 168/76 | HR 85 | Temp 98.5°F | Ht 60.0 in | Wt 169.8 lb

## 2020-04-15 DIAGNOSIS — I10 Essential (primary) hypertension: Secondary | ICD-10-CM | POA: Diagnosis not present

## 2020-04-15 DIAGNOSIS — K219 Gastro-esophageal reflux disease without esophagitis: Secondary | ICD-10-CM

## 2020-04-15 DIAGNOSIS — E119 Type 2 diabetes mellitus without complications: Secondary | ICD-10-CM | POA: Diagnosis not present

## 2020-04-15 DIAGNOSIS — G8929 Other chronic pain: Secondary | ICD-10-CM

## 2020-04-15 DIAGNOSIS — M25561 Pain in right knee: Secondary | ICD-10-CM

## 2020-04-15 MED ORDER — AMLODIPINE BESYLATE 5 MG PO TABS: 5.0000 mg | ORAL_TABLET | Freq: Every day | ORAL | 3 refills | Status: DC

## 2020-04-15 MED ORDER — OMEPRAZOLE 20 MG PO CPDR: 20.0000 mg | DELAYED_RELEASE_CAPSULE | Freq: Every morning | ORAL | 3 refills | Status: DC

## 2020-04-15 MED ORDER — GABAPENTIN 400 MG PO CAPS: 400.0000 mg | ORAL_CAPSULE | Freq: Three times a day (TID) | ORAL | 2 refills | Status: DC

## 2020-04-15 NOTE — Patient Instructions (Addendum)
  Health Maintenance After Age 68 After age 68, you are at a higher risk for certain long-term diseases and infections as well as injuries from falls. Falls are a major cause of broken bones and head injuries in people who are older than age 68. Getting regular preventive care can help to keep you healthy and well. Preventive care includes getting regular testing and making lifestyle changes as recommended by your health care provider. Talk with your health care provider about:  Which screenings and tests you should have. A screening is a test that checks for a disease when you have no symptoms.  A diet and exercise plan that is right for you. What should I know about screenings and tests to prevent falls? Screening and testing are the best ways to find a health problem early. Early diagnosis and treatment give you the best chance of managing medical conditions that are common after age 68. Certain conditions and lifestyle choices may make you more likely to have a fall. Your health care provider may recommend:  Regular vision checks. Poor vision and conditions such as cataracts can make you more likely to have a fall. If you wear glasses, make sure to get your prescription updated if your vision changes.  Medicine review. Work with your health care provider to regularly review all of the medicines you are taking, including over-the-counter medicines. Ask your health care provider about any side effects that may make you more likely to have a fall. Tell your health care provider if any medicines that you take make you feel dizzy or sleepy.  Osteoporosis screening. Osteoporosis is a condition that causes the bones to get weaker. This can make the bones weak and cause them to break more easily.  Blood pressure screening. Blood pressure changes and medicines to control blood pressure can make you feel dizzy.  Strength and balance checks. Your health care provider may recommend certain tests to  check your strength and balance while standing, walking, or changing positions.  Foot health exam. Foot pain and numbness, as well as not wearing proper footwear, can make you more likely to have a fall.  Depression screening. You may be more likely to have a fall if you have a fear of falling, feel emotionally low, or feel unable to do activities that you used to do.  Alcohol use screening. Using too much alcohol can affect your balance and may make you more likely to have a fall. What actions can I take to lower my risk of falls? General instructions  Talk with your health care provider about your risks for falling. Tell your health care provider if: ? You fall. Be sure to tell your health care provider about all falls, even ones that seem minor. ? You feel dizzy, sleepy, or off-balance.  Take over-the-counter and prescription medicines only as told by your health care provider. These include any supplements.  Eat a healthy diet and maintain a healthy weight. A healthy diet includes low-fat dairy products, low-fat (lean) meats, and fiber from whole grains, beans, and lots of fruits and vegetables. Home safety  Remove any tripping hazards, such as rugs, cords, and clutter.  Install safety equipment such as grab bars in bathrooms and safety rails on stairs.  Keep rooms and walkways well-lit. Activity  Follow a regular exercise program to stay fit. This will help you maintain your balance. Ask your health care provider what types of exercise are appropriate for you.  If you need a cane   or walker, use it as recommended by your health care provider.  Wear supportive shoes that have nonskid soles.   Lifestyle  Do not drink alcohol if your health care provider tells you not to drink.  If you drink alcohol, limit how much you have: ? 0-1 drink a day for women. ? 0-2 drinks a day for men.  Be aware of how much alcohol is in your drink. In the [place] one drink equals one typical bottle  of beer (12 oz), one-half glass of wine (5 oz), or one shot of hard liquor (1 oz).  Do not use any products that contain nicotine or tobacco, such as cigarettes and e-cigarettes. If you need help quitting, ask your health care provider. Summary  Having a healthy lifestyle and getting preventive care can help to protect your health and wellness after age 68.  Screening and testing are the best way to find a health problem early and help you avoid having a fall. Early diagnosis and treatment give you the best chance for managing medical conditions that are more common for people who are older than age 68.  Falls are a major cause of broken bones and head injuries in people who are older than age 68. Take precautions to prevent a fall at home.  Work with your health care provider to learn what changes you can make to improve your health and wellness and to prevent falls. This information is not intended to replace advice given to you by your health care provider. Make sure you discuss any questions you have with your health care provider. Document Revised: 06/01/2018 Document Reviewed: 12/22/2016 Elsevier Patient Education  2021 [employer]   If you have lab work done today you will be contacted with your lab results within the next 2 weeks.  If you have not heard from us then please contact us. The fastest way to get your results is to register for My Chart.   IF you received an x-ray today, you will receive an invoice from [facility]. Please contact  Radiology at [phone] with questions or concerns regarding your invoice.   IF you received labwork today, you will receive an invoice from [facility]. Please contact [facility] at [phone] with questions or concerns regarding your invoice.   Our billing staff will not be able to assist you with questions regarding bills from these companies.  You will be contacted with the lab results as soon as they are available.  The fastest way to get your results is to activate your My Chart account. Instructions are located on the last page of this paperwork. If you have not heard from us regarding the results in 2 weeks, please contact this office.      

## 2020-04-15 NOTE — Progress Notes (Signed)
2/22/202210:39 AM  Madison Collins Nov 24, 1950, 70 y.o., female [id number]  Chief Complaint  Patient presents with  . Transitions Of Care    HPI:   Patient is a 70 y.o. female with past medical history significant for HTN,HLP,GERD,migraines,chronic pain multiple sites on opiate therapy, prediabetes and depression with anxiety, vitamin D deficiency, h/o HCV s/p treatmentwho presents today for toc.  Denies acute issues at this time.  DM Metformin 107m Last OV increased Needs eye exam: will schedule Rechecking A1c today Continuing to work on LUnion [employer] CorporationLab Results  Component Value Date   HGBA1C 6.4 (H) 01/29/2020   HLD Lovastatin 40 mg Lab Results  Component Value Date   CHOL 208 (H) 07/10/2019   HDL 59 07/10/2019   LDLCALC 125 (H) 07/10/2019   TRIG 136 07/10/2019   CHOLHDL 3.5 07/10/2019    HTN Losartan 1033mChlorthalidone 25 mg Has been taking medications daily Still not at goal< 130/80 Doesn't take BP at home BP Readings from Last 3 Encounters:  04/15/20 (!) 168/76  03/24/20 (!) 171/71  03/18/20 (!) 156/60    Arthritis of knee, right Voltaren gel Mobic 1513maily Gabapentin 400 mg tid Stable - HYDROcodone-acetaminophen (NORCO) 10-325 MG tablet; Take 1 tablet by mouth every 6 (six) hours as needed. DX: CHRONIC KNEE PAIN. - ToxAssure:last 07/10/19 PDMP reviewed .  Environmental allergies FLONASE 2 sprays into both nostrils daily. Stable  Gastroesophageal reflux disease without esophagitis omeprazole 20 MG capsule daily Stable  Vitamin D deficiency Last vitamin D Lab Results  Component Value Date   VD25OH 28.9 (L) 07/10/2019   Colon cancer screening: will schedule this year Mammogram: 03/29/19 [place] 1: ordered Health Maintenance  Topic Date Due  . FOOT EXAM  Never done  . OPHTHALMOLOGY EXAM  Never done  . COLONOSCOPY (Pts 45-49y65yrsurance coverage will need to be confirmed)  10/23/2018  . COVID-19 Vaccine (3 - Pfizer risk  4-dose series) 09/21/2019  . HEMOGLOBIN A1C  07/29/2020  . MAMMOGRAM  03/28/2021  . TETANUS/TDAP  03/03/2027  . INFLUENZA VACCINE  Completed  . DEXA SCAN  Completed  . Hepatitis C Screening  Completed  . PNA vac Low Risk Adult  Completed     Depression screen PHQ [facility] 04/15/2020 03/24/2020 01/29/2020  Decreased Interest 0 0 0  Down, Depressed, Hopeless 0 0 0  PHQ - 2 Score 0 0 0  Altered sleeping - - -  Tired, decreased energy - - -  Change in appetite - - -  Feeling bad or failure about yourself  - - -  Trouble concentrating - - -  Moving slowly or fidgety/restless - - -  Suicidal thoughts - - -  PHQ-9 Score - - -  Difficult doing work/chores - - -  Some recent data might be hidden    Fall Risk  04/15/2020 03/24/2020 01/29/2020 07/10/2019 04/06/2019  Falls in the past year? '1 1 1 ' 0 0  Number falls in past yr: 0 0 0 0 0  Comment - - - - -  Injury with Fall? 1 0 1 0 0  Risk Factor Category  - - - - -  Risk for fall due to : - - - - -  Follow up Falls evaluation completed Falls evaluation completed Falls evaluation completed - -     Allergies  Allergen Reactions  . Latex Other (See Comments)    "Latex tape used during surgery - breaks my skin out"  . Adhesive [Tape] Rash    "per pt -  surgical tape, also causes itching    Prior to Admission medications   Medication Sig Start Date End Date Taking? Authorizing Provider  benzonatate (TESSALON PERLES) 100 MG capsule Take 1 capsule (100 mg total) by mouth every 6 (six) hours as needed for cough. 03/18/20 03/18/21 Yes Danford, Suann Larry, MD  chlorthalidone (HYGROTON) 25 MG tablet Take 1 tablet (25 mg total) by mouth daily. 02/05/20  Yes Unice Vantassel, Laurita Quint, FNP  citalopram (CELEXA) 40 MG tablet Take 1 tablet (40 mg total) by mouth every morning. 02/20/20  Yes Kemuel Buchmann, Laurita Quint, FNP  diclofenac Sodium (VOLTAREN) 1 % GEL Apply 4 g topically 4 (four) times daily. 01/05/19  Yes Jacelyn Pi, Irma M, MD  fluticasone [facility]) 50 MCG/ACT nasal  spray Place 2 sprays into both nostrils daily. 07/10/19  Yes Jacelyn Pi, Lilia Argue, MD  gabapentin (NEURONTIN) 400 MG capsule Take 1 capsule (400 mg total) by mouth 3 (three) times daily. 01/29/20  Yes Rhenda Oregon, Laurita Quint, FNP  HYDROcodone-acetaminophen (NORCO) 10-325 MG tablet Take 1 tablet by mouth every 6 (six) hours as needed. DX: CHRONIC KNEE PAIN. 03/14/20  Yes Alyviah Crandle, Laurita Quint, FNP  losartan (COZAAR) 100 MG tablet Take 1 tablet (100 mg total) by mouth daily. 01/29/20  Yes Jaidence Geisler, Laurita Quint, FNP  lovastatin (MEVACOR) 40 MG tablet Take 1 tablet (40 mg total) by mouth at bedtime. 07/26/19  Yes Jacelyn Pi, Lilia Argue, MD  meloxicam (MOBIC) 15 MG tablet Take 1 tablet (15 mg total) by mouth daily. 01/29/20  Yes Jadesola Poynter, Laurita Quint, FNP  metFORMIN (GLUCOPHAGE) 500 MG tablet Take 1,000 mg by mouth daily with breakfast.   Yes [provider]  metFORMIN (GLUMETZA) 500 MG (MOD) 24 hr tablet Take 2 tablets (1,000 mg total) by mouth daily with breakfast. 01/30/20  Yes Tory Septer, Laurita Quint, FNP  omeprazole (PRILOSEC) 20 MG capsule Take 1 capsule (20 mg total) by mouth every morning. 07/10/19  Yes Jacelyn Pi, Lilia Argue, MD  SUMAtriptan (IMITREX) 100 MG tablet Take 1 tablet (100 mg total) by mouth once for 1 dose. May repeat in 2 hours if headache persists or recurs. 07/10/19 07/10/19 Yes Daleen Squibb, MD    Past Medical History:  Diagnosis Date  . Anxiety   . Anxiety and depression   . Arthritis   . Bulging disc 1990's   c4-5  . Cataract    right eye  . Depression   . GERD (gastroesophageal reflux disease)   . H/O: osteoarthritis   . Headache    migraines occasionally  . Hepatitis C    IA viral load low 02/09    BX past mild fibrosis  . Hypertension   . Insomnia, unspecified   . Knee pain   . Post-menopause 2010    Past Surgical History:  Procedure Laterality Date  . BREAST BIOPSY Left   . BUNIONECTOMY Right 1983  . COLONOSCOPY  10/22/13  . EYE SURGERY Right 2010   hole in macula  . FRACTURE SURGERY  N/A    Phreesia 04/14/2020  . JOINT REPLACEMENT N/A    Phreesia 04/14/2020  . KNEE SURGERY Right     x4  . ORIF ELBOW FRACTURE Right 04/08/2015   Procedure: OPEN REDUCTION INTERNAL FIXATION (ORIF) ELBOW/OLECRANON FRACTURE;  Surgeon: Milly Jakob, MD;  Location: [place];  Service: Orthopedics;  Laterality: Right;  . ORIF RADIAL FRACTURE Right 04/08/2015   Procedure: OPEN REDUCTION INTERNAL FIXATION (ORIF) RADIAL HEAD ;  Surgeon: Milly Jakob, MD;  Location: [place];  Service: Orthopedics;  Laterality: Right;  . OVARIAN CYST REMOVAL    . TOTAL KNEE ARTHROPLASTY Right 12/24/2013   Procedure: RIGHT TOTAL KNEE ARTHROPLASTY;  Surgeon: Kerin Salen, MD;  Location: [place];  Service: Orthopedics;  Laterality: Right;  . TUBAL LIGATION  1987    Social History   Tobacco Use  . Smoking status: Never Smoker  . Smokeless tobacco: Never Used  Substance Use Topics  . Alcohol use: Yes    Comment: a glass of wine/rarely    Family History  Problem Relation Age of Onset  . Cancer Mother        Lung with brain mets  . Diabetes Mother   . Stroke Father 22  . Diabetes Father   . Colon cancer Father 66       colorectal/anal cancer  . Hypertension Brother   . Bipolar disorder Daughter   . Bipolar disorder Son   . Diabetes Maternal Grandfather   . Heart disease Maternal Grandfather   . Emphysema Paternal Grandmother   . Cancer Paternal Grandfather        stomach  . Breast cancer Maternal Aunt     Review of Systems  Constitutional: Negative for chills, fever and malaise/fatigue.  Eyes: Negative for blurred vision and double vision.  Respiratory: Negative for cough, shortness of breath and wheezing.   Cardiovascular: Negative for chest pain, palpitations and leg swelling.  Gastrointestinal: Negative for abdominal pain, blood in stool, constipation, diarrhea, heartburn, nausea and vomiting.  Genitourinary: Negative for dysuria, frequency and hematuria.  Musculoskeletal: Negative for back pain and  joint pain.  Skin: Negative for rash.  Neurological: Negative for dizziness, weakness and headaches.    OBJECTIVE:  Today's Vitals   04/15/20 1013 04/15/20 1016  BP: (!) 162/76 (!) 168/76  Pulse: 85   Temp: 98.5 F (36.9 C)   TempSrc: Temporal   SpO2: 97%   Weight: 169 lb 12.8 oz (77 kg)   Height: 5' (1.524 m)    Body mass index is 33.16 kg/m.   Physical Exam Constitutional:      General: She is not in acute distress.    Appearance: Normal appearance. She is not ill-appearing.  HENT:     Head: Normocephalic.  Cardiovascular:     Rate and Rhythm: Normal rate and regular rhythm.     Pulses: Normal pulses.     Heart sounds: Normal heart sounds. No murmur heard. No friction rub. No gallop.   Pulmonary:     Effort: Pulmonary effort is normal. No respiratory distress.     Breath sounds: Normal breath sounds. No stridor. No wheezing, rhonchi or rales.  Chest:     Chest wall: No tenderness.  Abdominal:     General: Bowel sounds are normal.     Palpations: Abdomen is soft.     Tenderness: There is no abdominal tenderness.  Musculoskeletal:     Right lower leg: No edema.     Left lower leg: No edema.  Skin:    General: Skin is warm and dry.  Neurological:     Mental Status: She is alert and oriented to person, place, and time.  Psychiatric:        Mood and Affect: Mood normal.        Behavior: Behavior normal.     No results found for this or any previous visit (from the past 24 hour(s)).  No results found.   ASSESSMENT and PLAN  Problem List Items Addressed This Visit  Cardiovascular and Mediastinum   Essential hypertension - Primary   Relevant Medications   amLODipine (NORVASC) 5 MG tablet   Other Relevant Orders   CMP14+EGFR     Digestive   Gastroesophageal reflux   Relevant Medications   omeprazole (PRILOSEC) 20 MG capsule     Endocrine   Diabetes (HCC)   Relevant Orders   Hemoglobin A1c   Lipid Panel     Other   Chronic knee pain    Relevant Medications   gabapentin (NEURONTIN) 400 MG capsule      Plan . Starting amlodipine along with other 2 BP medications, goal < 130/80 . Medication refills sent . Will follow up with lab results . Chronic conditions stable    Return in about 3 months (around 07/13/2020).    Huston Foley Jacquiline Zurcher, FNP-BC Primary Care at [place] [place], [place] [address] Ph.  [phone] Fax [phone]

## 2020-04-16 LAB — CMP14+EGFR
ALT: 13 IU/L (ref 0–32)
AST: 14 IU/L (ref 0–40)
Albumin/Globulin Ratio: 1.8 (ref 1.2–2.2)
Albumin: 4.4 g/dL (ref 3.8–4.8)
Alkaline Phosphatase: 110 IU/L (ref 44–121)
BUN/Creatinine Ratio: 24 (ref 12–28)
BUN: 17 mg/dL (ref 8–27)
Bilirubin Total: 0.3 mg/dL (ref 0.0–1.2)
CO2: 20 mmol/L (ref 20–29)
Calcium: 9.3 mg/dL (ref 8.7–10.3)
Chloride: 101 mmol/L (ref 96–106)
Creatinine, Ser: 0.72 mg/dL (ref 0.57–1.00)
GFR calc Af Amer: 100 mL/min/{1.73_m2} (ref >59)
GFR calc non Af Amer: 87 mL/min/{1.73_m2} (ref >59)
Globulin, Total: 2.5 g/dL (ref 1.5–4.5)
Glucose: 127 mg/dL — ABNORMAL HIGH (ref 65–99)
Potassium: 4.2 mmol/L (ref 3.5–5.2)
Sodium: 138 mmol/L (ref 134–144)
Total Protein: 6.9 g/dL (ref 6.0–8.5)

## 2020-04-16 LAB — LIPID PANEL
Chol/HDL Ratio: 3.2 ratio (ref 0.0–4.4)
Cholesterol, Total: 207 mg/dL — ABNORMAL HIGH (ref 100–199)
HDL: 65 mg/dL (ref >39)
LDL Chol Calc (NIH): 117 mg/dL — ABNORMAL HIGH (ref 0–99)
Triglycerides: 142 mg/dL (ref 0–149)
VLDL Cholesterol Cal: 25 mg/dL (ref 5–40)

## 2020-04-16 LAB — HEMOGLOBIN A1C
Est. average glucose Bld gHb Est-mCnc: 143 mg/dL
Hgb A1c MFr Bld: 6.6 % — ABNORMAL HIGH (ref 4.8–5.6)

## 2020-04-29 ENCOUNTER — Other Ambulatory Visit: Payer: Self-pay | Admitting: Family Medicine

## 2020-04-29 DIAGNOSIS — G8929 Other chronic pain: Secondary | ICD-10-CM

## 2020-04-29 DIAGNOSIS — M1711 Unilateral primary osteoarthritis, right knee: Secondary | ICD-10-CM

## 2020-04-30 MED ORDER — HYDROCODONE-ACETAMINOPHEN 10-325 MG PO TABS: 1.0000 | ORAL_TABLET | Freq: Four times a day (QID) | ORAL | 0 refills | Status: DC | PRN

## 2020-04-30 NOTE — Telephone Encounter (Signed)
Patient is requesting a refill of the following medications: Requested Prescriptions   Pending Prescriptions Disp Refills  . HYDROcodone-acetaminophen (NORCO) 10-325 MG tablet 110 tablet 0    Sig: Take 1 tablet by mouth every 6 (six) hours as needed. DX: CHRONIC KNEE PAIN.    Date of patient request: 04/29/2020 Last office visit: 04/15/2020 Date of last refill: 04/07/2020 Last refill amount:110 tablets  Follow up time period per chart: 07/08/2020

## 2020-05-01 ENCOUNTER — Other Ambulatory Visit: Payer: Self-pay | Admitting: Family Medicine

## 2020-05-01 DIAGNOSIS — M1711 Unilateral primary osteoarthritis, right knee: Secondary | ICD-10-CM

## 2020-05-01 DIAGNOSIS — G8929 Other chronic pain: Secondary | ICD-10-CM

## 2020-05-01 DIAGNOSIS — M25561 Pain in right knee: Secondary | ICD-10-CM

## 2020-05-01 NOTE — Telephone Encounter (Signed)
Patient called because she's in the process of looking for another primary care provider. Patient stated she recently received a refill of Hydrocodone; she's concerned she'll need another before she's able to establish care with a different provider.  HYDROcodone-acetaminophen (NORCO) 10-325 MG tablet [[id number]]   Pharmacy:   [facility] [address] - [place], [place] [address], [place] [address]  Phone:  [phone] Fax:  [phone]  DEA #:  --  Patient wants to know if a courtesy refill can be called in before we close. Please advise at [phone].

## 2020-05-02 MED ORDER — HYDROCODONE-ACETAMINOPHEN 10-325 MG PO TABS: 1.0000 | ORAL_TABLET | Freq: Four times a day (QID) | ORAL | 0 refills | Status: DC | PRN

## 2020-05-02 NOTE — Telephone Encounter (Signed)
Pt is looking for new PCP but would like you to send a refill of Hydro/Apap 10-325 mg so she doesn't run out before seeing new physician. Pended order with specifications not to fill before 30 days.   Please advise   Patient is requesting a refill of the following medications: Requested Prescriptions   Pending Prescriptions Disp Refills  . HYDROcodone-acetaminophen (NORCO) 10-325 MG tablet 110 tablet 0    Sig: Take 1 tablet by mouth every 6 (six) hours as needed. DX: CHRONIC KNEE PAIN.    Date of patient request: 05/02/2020 Last office visit: 04/15/2020 Date of last refill: 04/30/2020 Last refill amount: 110 tablets 0 refills  Follow up time period per chart: 3 months 07/13/2020

## 2020-06-04 ENCOUNTER — Telehealth: Payer: Self-pay | Admitting: Family Medicine

## 2020-06-04 NOTE — Telephone Encounter (Signed)
Patient is calling and wanted to see if the provider would take her as a new patient, please advise. CB is [phone]

## 2020-06-10 NOTE — Telephone Encounter (Signed)
LVM to notify patient that provider panel was full.

## 2020-06-10 NOTE — Telephone Encounter (Signed)
Provider has declined as her panel is currently full.

## 2020-06-12 ENCOUNTER — Telehealth: Payer: Self-pay

## 2020-06-12 NOTE — Telephone Encounter (Signed)
Patient is calling and stated that provider see's a family member and wanted to see if she will accept her as a new patient, please advise. CB is [phone]

## 2020-06-16 NOTE — Telephone Encounter (Signed)
Left a detailed message at the pts cell number with the information below. 

## 2020-06-16 NOTE — Telephone Encounter (Signed)
I'm sorry at this time I cannot accept new patients.

## 2020-07-08 ENCOUNTER — Ambulatory Visit: Payer: PPO | Admitting: Family Medicine

## 2020-07-11 ENCOUNTER — Other Ambulatory Visit: Payer: Self-pay

## 2020-07-11 ENCOUNTER — Ambulatory Visit: Payer: PPO | Admitting: Nurse Practitioner

## 2020-07-11 ENCOUNTER — Ambulatory Visit
Admission: EM | Admit: 2020-07-11 | Discharge: 2020-07-11 | Disposition: A | Discharge status: Home / Self Care | Payer: PPO | Attending: Family Medicine | Admitting: Family Medicine

## 2020-07-11 DIAGNOSIS — I10 Essential (primary) hypertension: Secondary | ICD-10-CM | POA: Diagnosis not present

## 2020-07-11 MED ORDER — CHLORTHALIDONE 25 MG PO TABS: 25.0000 mg | ORAL_TABLET | Freq: Every day | ORAL | 1 refills | Status: DC

## 2020-07-11 MED ORDER — LOSARTAN POTASSIUM 100 MG PO TABS: 100.0000 mg | ORAL_TABLET | Freq: Every day | ORAL | 3 refills | Status: DC

## 2020-07-11 NOTE — ED Provider Notes (Signed)
[facility]    CSN: [id number] Arrival date & time: 07/11/20  1937      History   Chief Complaint Chief Complaint  Patient presents with  . Medication Refill    HPI Madison Collins is a 70 y.o. female.   HPI  Patient presents today requesting medication refills on blood pressure medications and her chronic pain medicine. Patient had an appointment with a new PCP today however the PCP had an emergency and was a unable to see her today.  Her prior PCP office closed and she has been unable to establish with a new provider. She has chronic musculoskeletal pain.  She also has a recent history of a heroin overdose in January. Past Medical History:  Diagnosis Date  . Anxiety   . Anxiety and depression   . Arthritis   . Bulging disc 1990's   c4-5  . Cataract    right eye  . Depression   . GERD (gastroesophageal reflux disease)   . H/O: osteoarthritis   . Headache    migraines occasionally  . Hepatitis C    IA viral load low 02/09    BX past mild fibrosis  . Hypertension   . Insomnia, unspecified   . Knee pain   . Post-menopause 2010    Patient Active Problem List   Diagnosis Date Noted  . Chest pain, rule out acute myocardial infarction 03/18/2020  . Heroin overdose, accidental or unintentional, initial encounter ([place]) 03/18/2020  . Diabetes ([place]) 02/11/2020  . Osteopenia 01/05/2019  . Pure hypercholesterolemia 10/05/2018  . Class 1 obesity due to excess calories without serious comorbidity with body mass index (BMI) of 30.0 to 30.9 in adult 08/05/2016  . Chronic pain syndrome 08/05/2016  . Glucose intolerance 08/05/2016  . Arthritis of knee, right 12/23/2013  . Hepatitis C virus infection without hepatic coma 05/19/2011  . Essential hypertension 05/19/2011  . Migraine 05/19/2011  . Insomnia 05/19/2011  . Chronic knee pain 05/19/2011  . Depression with anxiety 05/19/2011  . Cervical disc syndrome 05/19/2011  . Gastroesophageal reflux  05/19/2011    Past Surgical History:  Procedure Laterality Date  . BREAST BIOPSY Left   . BUNIONECTOMY Right 1983  . COLONOSCOPY  10/22/13  . EYE SURGERY Right 2010   hole in macula  . FRACTURE SURGERY N/A    Phreesia 04/14/2020  . JOINT REPLACEMENT N/A    Phreesia 04/14/2020  . KNEE SURGERY Right     x4  . ORIF ELBOW FRACTURE Right 04/08/2015   Procedure: OPEN REDUCTION INTERNAL FIXATION (ORIF) ELBOW/OLECRANON FRACTURE;  Surgeon: Milly Jakob, MD;  Location: [place];  Service: Orthopedics;  Laterality: Right;  . ORIF RADIAL FRACTURE Right 04/08/2015   Procedure: OPEN REDUCTION INTERNAL FIXATION (ORIF) RADIAL HEAD ;  Surgeon: Milly Jakob, MD;  Location: [place];  Service: Orthopedics;  Laterality: Right;  . OVARIAN CYST REMOVAL    . TOTAL KNEE ARTHROPLASTY Right 12/24/2013   Procedure: RIGHT TOTAL KNEE ARTHROPLASTY;  Surgeon: Kerin Salen, MD;  Location: [place];  Service: Orthopedics;  Laterality: Right;  . TUBAL LIGATION  1987    OB History    Gravida      Para      Term      Preterm      AB      Living  2     SAB      IAB      Ectopic      Multiple  Live Births               Home Medications    Prior to Admission medications   Medication Sig Start Date End Date Taking? Authorizing Provider  amLODipine (NORVASC) 5 MG tablet Take 1 tablet (5 mg total) by mouth daily. 04/15/20   Just, Laurita Quint, FNP  chlorthalidone (HYGROTON) 25 MG tablet Take 1 tablet (25 mg total) by mouth daily. 07/11/20   Scot Jun, FNP  citalopram (CELEXA) 40 MG tablet Take 1 tablet (40 mg total) by mouth every morning. 02/20/20   Just, Laurita Quint, FNP  diclofenac Sodium (VOLTAREN) 1 % GEL Apply 4 g topically 4 (four) times daily. 01/05/19   Jacelyn Pi, Lilia Argue, MD  fluticasone [facility]) 50 MCG/ACT nasal spray Place 2 sprays into both nostrils daily. 07/10/19   Daleen Squibb, MD  gabapentin (NEURONTIN) 400 MG capsule Take 1 capsule (400 mg total) by mouth 3 (three)  times daily. 04/15/20   Just, Laurita Quint, FNP  HYDROcodone-acetaminophen (NORCO) 10-325 MG tablet Take 1 tablet by mouth every 6 (six) hours as needed. DX: CHRONIC KNEE PAIN. 05/02/20   Just, Laurita Quint, FNP  losartan (COZAAR) 100 MG tablet Take 1 tablet (100 mg total) by mouth daily. 07/11/20   Scot Jun, FNP  lovastatin (MEVACOR) 40 MG tablet Take 1 tablet (40 mg total) by mouth at bedtime. 07/26/19   Jacelyn Pi, Lilia Argue, MD  meloxicam (MOBIC) 15 MG tablet Take 1 tablet (15 mg total) by mouth daily. 01/29/20   Just, Laurita Quint, FNP  metFORMIN (GLUMETZA) 500 MG (MOD) 24 hr tablet Take 2 tablets (1,000 mg total) by mouth daily with breakfast. 01/30/20   Just, Laurita Quint, FNP  omeprazole (PRILOSEC) 20 MG capsule Take 1 capsule (20 mg total) by mouth every morning. 04/15/20   Just, Laurita Quint, FNP  SUMAtriptan (IMITREX) 100 MG tablet Take 1 tablet (100 mg total) by mouth once for 1 dose. May repeat in 2 hours if headache persists or recurs. 07/10/19 07/10/19  Daleen Squibb, MD    Family History Family History  Problem Relation Age of Onset  . Cancer Mother        Lung with brain mets  . Diabetes Mother   . Stroke Father 66  . Diabetes Father   . Colon cancer Father 33       colorectal/anal cancer  . Hypertension Brother   . Bipolar disorder Daughter   . Bipolar disorder Son   . Diabetes Maternal Grandfather   . Heart disease Maternal Grandfather   . Emphysema Paternal Grandmother   . Cancer Paternal Grandfather        stomach  . Breast cancer Maternal Aunt     Social History Social History   Tobacco Use  . Smoking status: Never Smoker  . Smokeless tobacco: Never Used  Vaping Use  . Vaping Use: Never used  Substance Use Topics  . Alcohol use: Yes    Comment: a glass of wine/rarely  . Drug use: Yes    Comment: Heroin on 03/17/2020     Allergies   Latex and Adhesive [tape]   Review of Systems Review of Systems Pertinent negatives listed in HPI   Physical Exam Triage  Vital Signs ED Triage Vitals [07/11/20 0850]  Enc Vitals Group     BP (!) 185/70     Pulse Rate 81     Resp 18     Temp 97.7 F (36.5 C)  Temp Source Oral     SpO2 97 %     Weight      Height      Head Circumference      Peak Flow      Pain Score 8     Pain Loc      Pain Edu?      Excl. in [place]?    No data found.  Updated Vital Signs BP (!) 185/70 (BP Location: Left Arm)   Pulse 81   Temp 97.7 F (36.5 C) (Oral)   Resp 18   SpO2 97%   Visual Acuity Right Eye Distance:   Left Eye Distance:   Bilateral Distance:    Right Eye Near:   Left Eye Near:    Bilateral Near:     Physical Exam General appearance: Alert, well developed, well nourished, cooperative  Head: Normocephalic, without obvious abnormality, atraumatic Respiratory: Respirations even and unlabored, normal respiratory rate Heart: Rate and rhythm normal. No gallop or murmurs noted on exam  Extremities: No gross deformities Skin: Skin color, texture, turgor normal. No rashes seen  Psych: Appropriate mood and affect. Neurologic: GCS 15, normal coordination normal gait  UC Treatments / Results  Labs (all labs ordered are listed, but only abnormal results are displayed) Labs Reviewed - No data to display  EKG   Radiology No results found.  Procedures Procedures (including critical care time)  Medications Ordered in UC Medications - No data to display  Initial Impression / Assessment and Plan / UC Course  I have reviewed the triage vital signs and the nursing notes.  Pertinent labs & imaging results that were available during my care of the patient were reviewed by me and considered in my medical decision making (see chart for details).     Refilled blood pressure medication, chlorthalidone and losartan reviewed most recent labs creatinine and GFR are both within normal limits. Declined to refill any controlled substances.  Provided resources to establish with new PCP office at dro[place]age  advised that several PCP offices within the [facility] will not prescribe chronic pain medication however may be able to offer the option of a referral to pain management. Patient's blood pressure slightly elevated however all other vital signs are within normal range. Final Clinical Impressions(s) / UC Diagnoses   Final diagnoses:  Primary hypertension   Discharge Instructions   None    ED Prescriptions    Medication Sig Dispense Auth. Provider   chlorthalidone (HYGROTON) 25 MG tablet Take 1 tablet (25 mg total) by mouth daily. 90 tablet Scot Jun, FNP   losartan (COZAAR) 100 MG tablet Take 1 tablet (100 mg total) by mouth daily. 90 tablet Scot Jun, FNP     PDMP not reviewed this encounter.   Scot Jun, [place] 07/11/20 [phone]

## 2020-07-11 NOTE — ED Triage Notes (Signed)
Pt states had an appt with her PCP this am and states went she got there they said the provider wouldn't be in for the next 2wk. States out of her hydrocodone and losartan.

## 2020-07-29 ENCOUNTER — Other Ambulatory Visit: Payer: Self-pay

## 2020-07-29 ENCOUNTER — Encounter (HOSPITAL_BASED_OUTPATIENT_CLINIC_OR_DEPARTMENT_OTHER): Payer: Self-pay | Admitting: Nurse Practitioner

## 2020-07-29 ENCOUNTER — Ambulatory Visit (INDEPENDENT_AMBULATORY_CARE_PROVIDER_SITE_OTHER): Payer: PPO | Admitting: Nurse Practitioner

## 2020-07-29 VITALS — BP 162/74 | HR 90 | Ht 60.0 in | Wt 171.6 lb

## 2020-07-29 DIAGNOSIS — G8929 Other chronic pain: Secondary | ICD-10-CM

## 2020-07-29 DIAGNOSIS — M1711 Unilateral primary osteoarthritis, right knee: Secondary | ICD-10-CM | POA: Diagnosis not present

## 2020-07-29 DIAGNOSIS — E782 Mixed hyperlipidemia: Secondary | ICD-10-CM | POA: Diagnosis not present

## 2020-07-29 DIAGNOSIS — G43009 Migraine without aura, not intractable, without status migrainosus: Secondary | ICD-10-CM | POA: Diagnosis not present

## 2020-07-29 DIAGNOSIS — E1159 Type 2 diabetes mellitus with other circulatory complications: Secondary | ICD-10-CM

## 2020-07-29 DIAGNOSIS — M25561 Pain in right knee: Secondary | ICD-10-CM

## 2020-07-29 DIAGNOSIS — Z7689 Persons encountering health services in other specified circumstances: Secondary | ICD-10-CM | POA: Diagnosis not present

## 2020-07-29 DIAGNOSIS — F418 Other specified anxiety disorders: Secondary | ICD-10-CM | POA: Diagnosis not present

## 2020-07-29 DIAGNOSIS — G894 Chronic pain syndrome: Secondary | ICD-10-CM | POA: Diagnosis not present

## 2020-07-29 DIAGNOSIS — I152 Hypertension secondary to endocrine disorders: Secondary | ICD-10-CM | POA: Diagnosis not present

## 2020-07-29 HISTORY — DX: Persons encountering health services in other specified circumstances: Z76.89

## 2020-07-29 MED ORDER — SUMATRIPTAN SUCCINATE 100 MG PO TABS
100.0000 mg | ORAL_TABLET | Freq: Once | ORAL | 5 refills | Status: DC
Start: 2020-07-29 — End: 2021-01-28

## 2020-07-29 MED ORDER — LOVASTATIN 40 MG PO TABS
40.0000 mg | ORAL_TABLET | Freq: Every day | ORAL | 3 refills | Status: DC
Start: 2020-07-29 — End: 2021-01-28

## 2020-07-29 MED ORDER — DICLOFENAC SODIUM 1 % EX GEL: 4.0000 g | Freq: Four times a day (QID) | CUTANEOUS | 5 refills | Status: DC

## 2020-07-29 MED ORDER — CITALOPRAM HYDROBROMIDE 40 MG PO TABS: 40.0000 mg | ORAL_TABLET | Freq: Every morning | ORAL | 1 refills | Status: DC

## 2020-07-29 MED ORDER — HYDROCODONE-ACETAMINOPHEN 10-325 MG PO TABS: 1.0000 | ORAL_TABLET | Freq: Four times a day (QID) | ORAL | 0 refills | Status: DC | PRN

## 2020-07-29 MED ORDER — CHLORTHALIDONE 25 MG PO TABS: 25.0000 mg | ORAL_TABLET | Freq: Every day | ORAL | 1 refills | Status: DC

## 2020-07-29 MED ORDER — MELOXICAM 15 MG PO TABS: 15.0000 mg | ORAL_TABLET | Freq: Every day | ORAL | 3 refills | Status: DC

## 2020-07-29 MED ORDER — METFORMIN HCL ER 500 MG PO TB24
1000.0000 mg | ORAL_TABLET | Freq: Every day | ORAL | 5 refills | Status: DC
Start: 2020-07-29 — End: 2021-01-28

## 2020-07-29 NOTE — Assessment & Plan Note (Signed)
Continue celexa- consider counseling services for increased anxiety and stressors with recent life events

## 2020-07-29 NOTE — Assessment & Plan Note (Signed)
Chronic pain with chronic opiate use for control Discussed with patient that we do not provide chronic opiates within this office- she is agreeable to see pain management for assistance with this. Script for short term use while she is establishing with pain management to avoid possible withdrawal and management of her pain No refills provided from this office due to policy- she is understanding of this.

## 2020-07-29 NOTE — Assessment & Plan Note (Signed)
Hx migraine headaches well controlled with Imitrex PRN No alarm symptoms present today

## 2020-07-29 NOTE — Patient Instructions (Addendum)
Recommendations from today's visit: . Refills for your medications have been sent to the [facility] on [address] . I have sent the referral to pain management for your chronic pain medications, I have also sent a referral to physical therapy to speak to them about dry needling.  . We will plan to have you come back in about August for repeat if you have not had one done in the last year.  . If your Blood Pressure is running high, you can take 2 amlodipine instead of just one.   Information on diet, exercise, and health maintenance recommendations are listed below. This is information to help you be sure you are on track for optimal health and monitoring.   Please look over this and let us know if you have any questions or if you have completed any of the health maintenance outside of [facility] so that we can be sure your records are up to date.  ___________________________________________________________  Thank you for choosing [facility] at [facility] for your Primary Care needs. I am excited for the opportunity to partner with you to meet your health care goals. It was a pleasure meeting you today!  I am an Adult-Geriatric Nurse Practitioner with a background in caring for patients for more than 20 years. I received my Paediatric nurse in Nursing and my Doctor of Nursing Practice degrees at [employer]. I received additional fellowship training in primary care and sports medicine after receiving my doctorate degree. I provide primary care and sports medicine services to patients age 65 and older within this office. I am also a provider with the [facility] and the director of the APP Fellowship with [employer] [employer].  I am a [place] native, but have called the [place] area home for nearly 20 years and am proud to be a member of this community.   I am passionate about providing the best service to you through preventive medicine  and supportive care. I consider you a part of the medical team and value your input. I work diligently to ensure that you are heard and your needs are met in a safe and effective manner. I want you to feel comfortable with me as your provider and want you to know that your health concerns are important to me.   For your information, our office hours are Monday- Friday 8:00 AM - 5:00 PM At this time I am not in the office on Wednesdays.  If you have questions or concerns, please call our office at [phone] or send [place] a MyChart message and we will respond as quickly as possible.   For all urgent or time sensitive needs we ask that you please call the office to avoid delays. MyChart is not constantly monitored and replies may take up to 72 business hours.  MyChart Policy: . MyChart allows for you to see your visit notes, after visit summary, provider recommendations, lab and tests results, make an appointment, request refills, and contact your provider or the office for non-urgent questions or concerns.  . Providers are seeing patients during normal business hours and do not have built in time to review MyChart messages. We ask that you allow a minimum of 72 business hours for MyChart message responses.  . Complex MyChart concerns may require a visit. Your provider may request you schedule a virtual or in person visit to ensure we are providing the best care possible. . MyChart messages sent after  4:00 PM on Friday will not be received by the provider until Monday morning.    Lab and Test Results: . You will receive your lab and test results on MyChart as soon as they are completed and results have been sent by the lab or testing facility. Due to this service, you will receive your results BEFORE your provider.  . Please allow a minimum of 72 business hours for your provider to receive and review lab and test results and contact you about.   . Most lab and test result comments from the provider  will be sent through [place]. Your provider may recommend changes to the plan of care, follow-up visits, repeat testing, ask questions, or request an office visit to discuss these results. You may reply directly to this message or call the office at [phone] to provide information for the provider or set up an appointment. . In some instances, you will be called with test results and recommendations. Please let us know if this is preferred and we will make note of this in your chart to provide this for you.    . If you have not heard a response to your lab or test results in 72 business hours, please call the office to let us know.   After Hours: . For all non-emergency after hours needs, please call the office at [phone] and select the option to reach the on-call provider service. On-call services are shared between multiple [facility] offices and therefore it will not be possible to speak directly with your provider. On-call providers may provide medical advice and recommendations, but are unable to provide refills for maintenance medications.  . For all emergency or urgent medical needs after normal business hours, we recommend that you seek care at the closest Urgent Care or Emergency Department to ensure appropriate treatment in a timely manner.  [facility] has a 24 hour emergency room located on the ground floor for your convenience.    Please do not hesitate to reach out to [place] with concerns.   Thank you, again, for choosing me as your health care partner. I appreciate your trust and look forward to learning more about you.   [name], DNP, AGNP-c ___________________________________________________________  Health Maintenance Recommendations Screening Testing  Mammogram  Every 1 -2 years based on history and risk factors  Starting at age 38  Pap Smear  Ages 21-39 every 3 years  Ages 23-65 every 5 years with HPV testing  More frequent  testing may be required based on results and history  Colon Cancer Screening  Every 1-10 years based on test performed, risk factors, and history  Starting at age 52  Bone Density Screening  Every 2-10 years based on history  Starting at age 54 for women  Recommendations for men differ based on medication usage, history, and risk factors  AAA Screening  One time ultrasound  Men 39-86 years old who have every smoked  Lung Cancer Screening  Low Dose Lung CT every 12 months  Age 77-80 years with a 30 pack-year smoking history who still smoke or who have quit within the last 15 years  Screening Labs  Routine  Labs: Complete Blood Count (CBC), Complete Metabolic Panel (CMP), Cholesterol (Lipid Panel)  Every 6-12 months based on history and medications  May be recommended more frequently based on current conditions or previous results  Hemoglobin A1c Lab  Every 3-12 months based on history and previous results  Starting at age  66 or earlier with diagnosis of diabetes, high cholesterol, BMI >26, and/or risk factors  Frequent monitoring for patients with diabetes to ensure blood sugar control  Thyroid Panel (TSH w/ T3 & T4)  Every 6 months based on history, symptoms, and risk factors  May be repeated more often if on medication  HIV  One time testing for all patients 84 and older  May be repeated more frequently for patients with increased risk factors or exposure  Hepatitis C  One time testing for all patients 41 and older  May be repeated more frequently for patients with increased risk factors or exposure  Gonorrhea, Chlamydia  Every 12 months for all sexually active persons 13-24 years  Additional monitoring may be recommended for those who are considered high risk or who have symptoms  PSA  Men 69-45 years old with risk factors  Additional screening may be recommended from age 25-69 based on risk factors, symptoms, and history  Vaccine  Recommendations  Tetanus Booster  All adults every 10 years  Flu Vaccine  All patients 6 months and older every year  COVID Vaccine  All patients 12 years and older  Initial dosing with booster  May recommend additional booster based on age and health history  HPV Vaccine  2 doses all patients age 52-26  Dosing may be considered for patients over 26  Shingles Vaccine (Shingrix)  2 doses all adults 7 years and older  Pneumonia (Pneumovax 23)  All adults 82 years and older  May recommend earlier dosing based on health history  Pneumonia (Prevnar 62)  All adults 68 years and older  Dosed 1 year after Pneumovax 23  Additional Screening, Testing, and Vaccinations may be recommended on an individualized basis based on family history, health history, risk factors, and/or exposure.  __________________________________________________________  Diet Recommendations for All Patients  I recommend that all patients maintain a diet low in saturated fats, carbohydrates, and cholesterol. While this can be challenging at first, it is not impossible and small changes can make big differences.  Things to try: Marland Kitchen Decreasing the amount of soda, sweet tea, and/or juice to one or less per day and replace with water o While water is always the first choice, if you do not like water you may consider - adding a water additive without sugar to improve the taste - other sugar free drinks . Replace potatoes with a brightly colored vegetable at dinner . Use healthy oils, such as canola oil or olive oil, instead of butter or hard margarine . Limit your bread intake to two pieces or less a day . Replace regular pasta with low carb pasta options . Bake, broil, or grill foods instead of frying . Monitor portion sizes  . Eat smaller, more frequent meals throughout the day instead of large meals  An important thing to remember is, if you love foods that are not great for your health, you don't  have to give them up completely. Instead, allow these foods to be a reward when you have done well. Allowing yourself to still have special treats every once in a while is a nice way to tell yourself thank you for working hard to keep yourself healthy.   Also remember that every day is a new day. If you have a bad day and "fall off the wagon", you can still climb right back up and keep moving along on your journey!  We have resources available to help you!  Some websites that may be  helpful include: . www.http://carter.biz/  . Www.VeryWellFit.com _____________________________________________________________  Activity Recommendations for All Patients  I recommend that all adults get at least 20 minutes of moderate physical activity that elevates your heart rate at least 5 days out of the week.  Some examples include: . Walking or jogging at a pace that allows you to carry on a conversation . Cycling (stationary bike or outdoors) . Water aerobics . Yoga . Weight lifting . Dancing If physical limitations prevent you from putting stress on your joints, exercise in a pool or seated in a chair are excellent options.  Do determine your MAXIMUM heart rate for activity: YOUR AGE - 220 = MAX HeartRate   Remember! . Do not push yourself too hard.  . Start slowly and build up your pace, speed, weight, time in exercise, etc.  . Allow your body to rest between exercise and get good sleep. . You will need more water than normal when you are exerting yourself. Do not wait until you are thirsty to drink. Drink with a purpose of getting in at least 8, 8 ounce glasses of water a day plus more depending on how much you exercise and sweat.    If you begin to develop dizziness, chest pain, abdominal pain, jaw pain, shortness of breath, headache, vision changes, lightheadedness, or other concerning symptoms, stop the activity and allow your body to rest. If your symptoms are severe, seek emergency evaluation  immediately. If your symptoms are concerning, but not severe, please let us know so that we can recommend further evaluation.   ________________________________________________________________

## 2020-07-29 NOTE — Progress Notes (Signed)
[name], DNP, AGNP-c Primary Care Services ______________________________________________________________________________________________________________________________________________  HPI Madison Collins is a 70 y.o. female presenting to [facility] today to establish care.   Patient Care Team: Shontae Rosiles, Coralee Pesa, NP as PCP - General (Nurse Practitioner) Telford Nab, Oneida Alar, MD (Inactive) (Orthopedic Surgery)  Concerns today: . Chronic Pain o Endorses chronic pain with management by previous provider with TID Norco 10-325, meloxicam, gabapentin, voltaren, and celexa o Reports she has been rationing her pain medication after previous PCP left to avoid withdrawal symptoms or running out.  o Endorses severe pain daily with interference with daily life o Back, shoulders, neck, arm, and knees affected.  o Previous injuries and surgeries origin of pain  PHQ9 Today: Depression screen [facility] 2/9 07/29/2020 04/15/2020 03/24/2020  Decreased Interest 0 0 0  Down, Depressed, Hopeless 1 0 0  PHQ - 2 Score 1 0 0  Altered sleeping 1 - -  Tired, decreased energy 2 - -  Change in appetite 1 - -  Feeling bad or failure about yourself  0 - -  Trouble concentrating 0 - -  Moving slowly or fidgety/restless 0 - -  Suicidal thoughts 0 - -  PHQ-9 Score 5 - -  Difficult doing work/chores Somewhat difficult - -  Some recent data might be hidden   GAD7 Today: GAD 7 : Generalized Anxiety Score 07/29/2020 07/10/2019  Nervous, Anxious, on Edge 0 1  Control/stop worrying 1 1  Worry too much - different things 1 1  Trouble relaxing 1 1  Restless 1 1  Easily annoyed or irritable 1 2  Afraid - awful might happen 0 0  Total GAD 7 Score 5 7  Anxiety Difficulty Somewhat difficult -    Health Maintenance Due  Topic Date Due  . Pneumococcal Vaccine 53-76 Years old (1 of 4 - PCV13) Never done  . FOOT EXAM  Never done  . OPHTHALMOLOGY EXAM   Never done  . Zoster Vaccines- Shingrix (2 of 2) 08/17/2016  . COLONOSCOPY (Pts 45-29yrs Insurance coverage will need to be confirmed)  10/23/2018  . COVID-19 Vaccine (3 - Pfizer risk 4-dose series) 09/21/2019     PMH Past Medical History:  Diagnosis Date  . Anxiety   . Anxiety and depression   . Arthritis   . Bulging disc 1990's   c4-5  . Cataract    right eye  . Class 1 obesity due to excess calories without serious comorbidity with body mass index (BMI) of 30.0 to 30.9 in adult 08/05/2016  . Depression   . GERD (gastroesophageal reflux disease)   . H/O: osteoarthritis   . Headache    migraines occasionally  . Hepatitis C    IA viral load low 02/09    BX past mild fibrosis  . Hypertension   . Insomnia, unspecified   . Knee pain   . Post-menopause 2010    ROS All review of systems negative except what is listed in the HPI  PHYSICAL EXAM General appearance: alert, cooperative, appears stated age, no distress and mildly obese, Resp: clear to auscultation bilaterally, Cardio: regular rate and rhythm, S1, S2 normal, no murmur, click, rub or gallop, GI: soft, non-tender; bowel sounds normal; no masses,  no organomegaly and Extremities: extremities normal, atraumatic, no cyanosis or edema   ASSESSMENT AND PLAN Problem List Items Addressed This Visit    Chronic pain syndrome (Chronic)    Chronic pain with chronic opiate use for control Discussed  with patient that we do not provide chronic opiates within this office- she is agreeable to see pain management for assistance with this. Script for short term use while she is establishing with pain management to avoid possible withdrawal and management of her pain No refills provided from this office due to policy- she is understanding of this.      Relevant Medications   HYDROcodone-acetaminophen (NORCO) 10-325 MG tablet   diclofenac Sodium (VOLTAREN) 1 % GEL   meloxicam (MOBIC) 15 MG tablet   citalopram (CELEXA) 40 MG tablet    Other Relevant Orders   Ambulatory referral to Pain Clinic   Ambulatory referral to Physical Therapy   Migraine    Hx migraine headaches well controlled with Imitrex PRN No alarm symptoms present today      Relevant Medications   HYDROcodone-acetaminophen (NORCO) 10-325 MG tablet   SUMAtriptan (IMITREX) 100 MG tablet   lovastatin (MEVACOR) 40 MG tablet   meloxicam (MOBIC) 15 MG tablet   chlorthalidone (HYGROTON) 25 MG tablet   citalopram (CELEXA) 40 MG tablet   Depression with anxiety    Continue celexa- consider counseling services for increased anxiety and stressors with recent life events      Relevant Medications   citalopram (CELEXA) 40 MG tablet   Encounter to establish care - Primary    Review of current and past medical history, social history, medication, and family history.  Review of care gaps and health maintenance recommendations.  Records from recent providers to be requested if not available in Chart Review or Care Everywhere.  Recommendations for health maintenance, diet, and exercise provided.        Chronic knee pain   Relevant Medications   HYDROcodone-acetaminophen (NORCO) 10-325 MG tablet   metFORMIN (GLUCOPHAGE-XR) 500 MG 24 hr tablet   diclofenac Sodium (VOLTAREN) 1 % GEL   meloxicam (MOBIC) 15 MG tablet   citalopram (CELEXA) 40 MG tablet   Other Relevant Orders   Ambulatory referral to Pain Clinic   Ambulatory referral to Physical Therapy   Arthritis of knee, right (Chronic)   Relevant Medications   HYDROcodone-acetaminophen (NORCO) 10-325 MG tablet   meloxicam (MOBIC) 15 MG tablet   Other Relevant Orders   Ambulatory referral to Pain Clinic   Ambulatory referral to Physical Therapy    Other Visit Diagnoses    Hypertension associated with diabetes ([place])       Relevant Medications   metFORMIN (GLUCOPHAGE-XR) 500 MG 24 hr tablet   lovastatin (MEVACOR) 40 MG tablet   chlorthalidone (HYGROTON) 25 MG tablet   Mixed hyperlipidemia       Relevant  Medications   lovastatin (MEVACOR) 40 MG tablet   chlorthalidone (HYGROTON) 25 MG tablet      Education provided today during visit and on AVS for patient to review at home.  Diet and Exercise recommendations provided.  Current diagnoses and recommendations discussed. HM recommendations reviewed with recommendations.    Outpatient Encounter Medications as of 07/29/2020  Medication Sig  . amLODipine (NORVASC) 5 MG tablet Take 1 tablet (5 mg total) by mouth daily.  . fluticasone (FLONASE) 50 MCG/ACT nasal spray Place 2 sprays into both nostrils daily.  Marland Kitchen gabapentin (NEURONTIN) 400 MG capsule Take 1 capsule (400 mg total) by mouth 3 (three) times daily.  Marland Kitchen HYDROcodone-acetaminophen (NORCO) 10-325 MG tablet Take 1 tablet by mouth every 6 (six) hours as needed for up to 40 doses for severe pain. DX: CHRONIC KNEE PAIN and CHRONIC BACK PAIN.  Marland Kitchen losartan (COZAAR)  100 MG tablet Take 1 tablet (100 mg total) by mouth daily.  Marland Kitchen omeprazole (PRILOSEC) 20 MG capsule Take 1 capsule (20 mg total) by mouth every morning.  . [DISCONTINUED] chlorthalidone (HYGROTON) 25 MG tablet Take 1 tablet (25 mg total) by mouth daily.  . [DISCONTINUED] citalopram (CELEXA) 40 MG tablet Take 1 tablet (40 mg total) by mouth every morning.  . [DISCONTINUED] diclofenac Sodium (VOLTAREN) 1 % GEL Apply 4 g topically 4 (four) times daily.  . [DISCONTINUED] HYDROcodone-acetaminophen (NORCO) 10-325 MG tablet Take 1 tablet by mouth every 6 (six) hours as needed. DX: CHRONIC KNEE PAIN.  . [DISCONTINUED] lovastatin (MEVACOR) 40 MG tablet Take 1 tablet (40 mg total) by mouth at bedtime.  . [DISCONTINUED] meloxicam (MOBIC) 15 MG tablet Take 1 tablet (15 mg total) by mouth daily.  . [DISCONTINUED] metFORMIN (GLUMETZA) 500 MG (MOD) 24 hr tablet Take 2 tablets (1,000 mg total) by mouth daily with breakfast.  . chlorthalidone (HYGROTON) 25 MG tablet Take 1 tablet (25 mg total) by mouth daily.  . citalopram (CELEXA) 40 MG tablet Take 1 tablet  (40 mg total) by mouth every morning.  . diclofenac Sodium (VOLTAREN) 1 % GEL Apply 4 g topically 4 (four) times daily.  Marland Kitchen lovastatin (MEVACOR) 40 MG tablet Take 1 tablet (40 mg total) by mouth at bedtime.  . meloxicam (MOBIC) 15 MG tablet Take 1 tablet (15 mg total) by mouth daily.  . metFORMIN (GLUCOPHAGE-XR) 500 MG 24 hr tablet Take 2 tablets (1,000 mg total) by mouth daily with breakfast.  . SUMAtriptan (IMITREX) 100 MG tablet Take 1 tablet (100 mg total) by mouth once for 1 dose. May repeat in 2 hours if headache persists or recurs.  . [DISCONTINUED] metFORMIN (GLUCOPHAGE-XR) 500 MG 24 hr tablet Take 500 mg by mouth 2 (two) times daily.  . [DISCONTINUED] SUMAtriptan (IMITREX) 100 MG tablet Take 1 tablet (100 mg total) by mouth once for 1 dose. May repeat in 2 hours if headache persists or recurs.   No facility-administered encounter medications on file as of 07/29/2020.    F/U in August for labs  Time: 65 minutes, >50% spent counseling, care coordination, chart review, and documentation.   Orma Render, DNP, AGNP-c

## 2020-07-29 NOTE — Assessment & Plan Note (Signed)
Review of current and past medical history, social history, medication, and family history.  Review of care gaps and health maintenance recommendations.  Records from recent providers to be requested if not available in Chart Review or Care Everywhere.  Recommendations for health maintenance, diet, and exercise provided.   

## 2020-08-04 ENCOUNTER — Encounter: Payer: Self-pay | Admitting: Physical Medicine and Rehabilitation

## 2020-08-18 ENCOUNTER — Other Ambulatory Visit: Payer: Self-pay

## 2020-08-18 ENCOUNTER — Encounter (HOSPITAL_BASED_OUTPATIENT_CLINIC_OR_DEPARTMENT_OTHER): Payer: Self-pay | Admitting: Physical Therapy

## 2020-08-18 ENCOUNTER — Ambulatory Visit (HOSPITAL_BASED_OUTPATIENT_CLINIC_OR_DEPARTMENT_OTHER): Payer: PPO | Attending: Nurse Practitioner | Admitting: Physical Therapy

## 2020-08-18 DIAGNOSIS — M6281 Muscle weakness (generalized): Secondary | ICD-10-CM | POA: Insufficient documentation

## 2020-08-18 DIAGNOSIS — M25561 Pain in right knee: Secondary | ICD-10-CM | POA: Insufficient documentation

## 2020-08-18 DIAGNOSIS — R262 Difficulty in walking, not elsewhere classified: Secondary | ICD-10-CM | POA: Diagnosis not present

## 2020-08-18 DIAGNOSIS — G8929 Other chronic pain: Secondary | ICD-10-CM

## 2020-08-18 DIAGNOSIS — M25521 Pain in right elbow: Secondary | ICD-10-CM | POA: Insufficient documentation

## 2020-08-18 NOTE — Therapy (Signed)
[facility] [address] [place], [place], [address] Phone: [phone]   Fax:  [phone]  Physical Therapy Evaluation  Patient Details  Name: Madison Collins MRN: [id number] Date of Birth: 1951-01-10 Referring Provider (PT): Jacolyn Reedy, NP   Encounter Date: 08/18/2020   PT End of Session - 08/18/20 1518     Visit Number 1    Number of Visits 25    Date for PT Re-Evaluation 11/14/20    Authorization Type [employer]    PT Start Time 4193    PT Stop Time 1559    PT Time Calculation (min) 44 min    Activity Tolerance Patient tolerated treatment well    Behavior During Therapy Anxious             Past Medical History:  Diagnosis Date   Anxiety    Anxiety and depression    Arthritis    Bulging disc 1990's   c4-5   Cataract    right eye   Class 1 obesity due to excess calories without serious comorbidity with body mass index (BMI) of 30.0 to 30.9 in adult 08/05/2016   Depression    GERD (gastroesophageal reflux disease)    H/O: osteoarthritis    Headache    migraines occasionally   Hepatitis C    IA viral load low 02/09    BX past mild fibrosis   Hypertension    Insomnia, unspecified    Knee pain    Post-menopause 2010    Past Surgical History:  Procedure Laterality Date   BREAST BIOPSY Left    BUNIONECTOMY Right 1983   COLONOSCOPY  10/22/13   EYE SURGERY Right 2010   hole in macula   FRACTURE SURGERY N/A    Phreesia 04/14/2020   JOINT REPLACEMENT N/A    Phreesia 04/14/2020   KNEE SURGERY Right     x4   ORIF ELBOW FRACTURE Right 04/08/2015   Procedure: OPEN REDUCTION INTERNAL FIXATION (ORIF) ELBOW/OLECRANON FRACTURE;  Surgeon: Milly Jakob, MD;  Location: [facility];  Service: Orthopedics;  Laterality: Right;   ORIF RADIAL FRACTURE Right 04/08/2015   Procedure: OPEN REDUCTION INTERNAL FIXATION (ORIF) RADIAL HEAD ;  Surgeon: Milly Jakob, MD;  Location: [facility];  Service: Orthopedics;   Laterality: Right;   OVARIAN CYST REMOVAL     TOTAL KNEE ARTHROPLASTY Right 12/24/2013   Procedure: RIGHT TOTAL KNEE ARTHROPLASTY;  Surgeon: Kerin Salen, MD;  Location: [facility];  Service: Orthopedics;  Laterality: Right;   TUBAL LIGATION  1987    There were no vitals filed for this visit.    Subjective Assessment - 08/18/20 1521     Subjective Was athletic when I was younger. Late 20s-early 30s Rt knee started giving way and locking out. 5 surgeries on knee and TKR 2015. My Lt knee is starting to give way a bit. [place] on my Rt elbow 2017 and had surgery and PT. last year was walking in [place] and fell and fractured Lt radius. Caring for 4 grand children. I try to find a few moments to myself in a day to read or reflect but I don't get much time for myself.    Patient Stated Goals strength to get up and down, decr elbow swelling, strengthen Rt UE, childcare activities    Currently in Pain? Yes    Pain Score 9     Pain Location Other (Comment)   full body   Pain Descriptors / Indicators Other (Comment)  giving way, constant pain   Pain Type Chronic pain    Pain Onset More than a month ago    Pain Frequency Constant    Aggravating Factors  squatting    Pain Relieving Factors finding a comfortable position, medications                [facility] PT Assessment - 08/18/20 0001       Assessment   Medical Diagnosis Rt knee pain, general chronic pain    Referring Provider (PT) Jacolyn Reedy, NP    Onset Date/Surgical Date --   in my 12s   Hand Dominance Right    Prior Therapy in the past, not this yea      Precautions   Precautions None      Restrictions   Weight Bearing Restrictions No      Balance Screen   Has the patient fallen in the past 6 months No      [place] residence    Living Arrangements Other relatives;Spouse/significant other    Additional Comments 2 flights of stairs with handrails      Prior Function   Level of Independence  Independent    Vocation Part time employment    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      Cognition   Overall Cognitive Status Within Functional Limits for tasks assessed      Sensation   Additional Comments arms go numb when I get stressed      Posture/Postural Control   Posture Comments ant pelvic tilt, incr lumbar lordosis, foward rounded shoulders      ROM / Strength   AROM / PROM / Strength AROM      AROM   AROM Assessment Site Cervical    Cervical - Right Side Bend 30    Cervical - Left Side Bend 34    Cervical - Right Rotation 70    Cervical - Left Rotation 60      Flexibility   Soft Tissue Assessment /Muscle Length --   incr flexibility noted grossly                       Objective measurements completed on examination: See above findings.       [facility] Adult PT Treatment/Exercise - 08/18/20 0001       Exercises   Exercises Neck      Manual Therapy   Manual Therapy Soft tissue mobilization;Joint mobilization    Manual therapy comments skilled palpation and monitoring during TPDN    Joint Mobilization bil first rib depression gr 4    Soft tissue mobilization bil upper trap      Neck Exercises: Stretches   Other Neck Stretches scap retraction + upper trap, + levator stretch bil              Trigger Point Dry Needling - 08/18/20 0001     Consent Given? Yes    Education Handout Provided --   verbal education   Muscles Treated Head and Neck Upper trapezius    Upper Trapezius Response Twitch reponse elicited;Palpable increased muscle length   bil                 PT Education - 08/18/20 2042     Education Details anatomy of condition, POC,HEP, exercise form/rationale, TPDN & expected outcomes    Person(s) Educated Patient    Methods Explanation;Demonstration;Tactile cues;Verbal cues;Handout    Comprehension Verbalized understanding;Returned  demonstration;Verbal cues required;Tactile cues required;Need further instruction               PT Short Term Goals - 08/18/20 2114       PT SHORT TERM GOAL #1   Title pt will verbalize ability to utilize proper scapular motion to improve postures in daily activities    Baseline will cont to educate    Time 3    Period Weeks    Status New    Target Date 09/12/20      PT SHORT TERM GOAL #2   Title pt will be able to work exercises into her daily activities for regular stretching    Baseline denies time to do any stretches/exercises right now    Time 3    Period Weeks    Status New    Target Date 09/12/20               PT Long Term Goals - 08/18/20 2117       PT LONG TERM GOAL #1   Title average pain <=5/10    Baseline 9/10 at rest at eval    Time 12    Period Weeks    Status New    Target Date 11/14/20      PT LONG TERM GOAL #2   Title pt will be able to get up and down from the floor with min assist from external objects    Baseline reports significant difficulty at eval    Time 12    Period Weeks    Status New    Target Date 11/14/20      PT LONG TERM GOAL #3   Title Rt UE will demo strength to support utilization of dominant UE through ADLs    Baseline finds that she is using her Lt UE moreso    Time 12    Period Weeks    Status New    Target Date 11/14/20                    Plan - 08/18/20 2043     Clinical Impression Statement Pt presents to PT with complaints of bil knee pain, bil elbow pain, bil shoulder pain and a gross chronic pain throughout her body. Pt reports she operates at a high level of pain at all times but keeps going as she has custody of 4 children. She tends to demo rounded shoulder and elevated posture that she admits may be where she carries her stress. DN utilized today to decr tension in bil upper traps which decreased tension. I encouraged pt to consider water therapy 1/week and land 1/week as a benefit to chronic pain and strengthening LEs with less risk of knees giving out. Pt will discuss copay  and decide if this is appropriate at this time in her life. Pt willl benefit from skilled PT to address deficits and reach functional goals.    Personal Factors and Comorbidities Time since onset of injury/illness/exacerbation;Comorbidity 3+    Comorbidities multiple pain sites, arthritis, cervical disc buldges    Examination-Activity Limitations Locomotion Level;Bed Mobility;Bend;Sit;Caring for Others;Sleep;Squat;Stairs;Stand;Lift    Examination-Participation Restrictions Meal Prep;Cleaning;Occupation;Driving;Laundry    Stability/Clinical Decision Making Evolving/Moderate complexity    Clinical Decision Making Moderate    Rehab Potential Fair    PT Frequency 2x / week    PT Duration 12 weeks    PT Treatment/Interventions ADLs/Self Care Home Management;Cryotherapy;Electrical Stimulation;Aquatic Therapy;Gait training;Ultrasound;Traction;Moist Heat;Iontophoresis 4mg /ml Dexamethasone;Stair training;Functional mobility training;Therapeutic activities;Therapeutic exercise;Balance training;Neuromuscular re-education;Manual  techniques;Patient/family education;Passive range of motion;Dry needling;Joint Manipulations;Spinal Manipulations;Taping    PT Next Visit Plan outcome of DN? aquatics?    PT Home Exercise Plan [email or web address]    Consulted and Agree with Plan of Care Patient             Patient will benefit from skilled therapeutic intervention in order to improve the following deficits and impairments:  Difficulty walking, Impaired UE functional use, Increased muscle spasms, Decreased activity tolerance, Pain, Improper body mechanics, Decreased strength, Postural dysfunction, Impaired sensation  Visit Diagnosis: Pain in right elbow - Plan: PT plan of care cert/re-cert  Chronic pain of right knee - Plan: PT plan of care cert/re-cert  Muscle weakness (generalized) - Plan: PT plan of care cert/re-cert  Difficulty in walking, not elsewhere classified - Plan: PT plan of care  cert/re-cert     Problem List Patient Active Problem List   Diagnosis Date Noted   Encounter to establish care 07/29/2020   Chest pain, rule out acute myocardial infarction 03/18/2020   Heroin overdose, accidental or unintentional, initial encounter ([facility]) 03/18/2020   Diabetes ([facility]) 02/11/2020   Osteopenia 01/05/2019   Pure hypercholesterolemia 10/05/2018   Chronic pain syndrome 08/05/2016   Glucose intolerance 08/05/2016   Arthritis of knee, right 12/23/2013   Hepatitis C virus infection without hepatic coma 05/19/2011   Migraine 05/19/2011   Insomnia 05/19/2011   Chronic knee pain 05/19/2011   Depression with anxiety 05/19/2011   Cervical disc syndrome 05/19/2011   Gastroesophageal reflux 05/19/2011   Chevelle Durr C. Zenora Karpel PT, DPT 08/18/20 9:40 PM   [facility] [address] [place], [place], [address] Phone: [phone]   Fax:  [phone]  Name: Madison Collins MRN: [id number] Date of Birth: 1950-03-08

## 2020-08-20 ENCOUNTER — Encounter: Payer: Self-pay | Admitting: Physical Medicine and Rehabilitation

## 2020-08-20 ENCOUNTER — Other Ambulatory Visit: Payer: Self-pay

## 2020-08-20 ENCOUNTER — Encounter: Payer: PPO | Attending: Physical Medicine and Rehabilitation | Admitting: Physical Medicine and Rehabilitation

## 2020-08-20 VITALS — BP 137/73 | HR 73 | Temp 98.1°F | Ht 60.0 in | Wt 170.6 lb

## 2020-08-20 DIAGNOSIS — Z5181 Encounter for therapeutic drug level monitoring: Secondary | ICD-10-CM

## 2020-08-20 DIAGNOSIS — G894 Chronic pain syndrome: Secondary | ICD-10-CM

## 2020-08-20 DIAGNOSIS — Z79891 Long term (current) use of opiate analgesic: Secondary | ICD-10-CM | POA: Diagnosis not present

## 2020-08-20 DIAGNOSIS — M25561 Pain in right knee: Secondary | ICD-10-CM

## 2020-08-20 DIAGNOSIS — M1711 Unilateral primary osteoarthritis, right knee: Secondary | ICD-10-CM

## 2020-08-20 DIAGNOSIS — M7918 Myalgia, other site: Secondary | ICD-10-CM

## 2020-08-20 DIAGNOSIS — G8929 Other chronic pain: Secondary | ICD-10-CM

## 2020-08-20 MED ORDER — DICLOFENAC SODIUM 75 MG PO TBEC
75.0000 mg | DELAYED_RELEASE_TABLET | Freq: Two times a day (BID) | ORAL | 11 refills | Status: DC
  Filled 2021-04-17: qty 60, 30d supply, fill #0
  Filled 2021-06-10: qty 60, 30d supply, fill #1
  Filled 2021-07-22: qty 60, 30d supply, fill #2

## 2020-08-20 MED ORDER — HYDROCODONE-ACETAMINOPHEN 10-325 MG PO TABS
1.0000 | ORAL_TABLET | Freq: Four times a day (QID) | ORAL | 0 refills | Status: DC | PRN
Start: 2020-08-20 — End: 2021-01-28

## 2020-08-20 MED ORDER — CYCLOBENZAPRINE HCL 10 MG PO TABS
10.0000 mg | ORAL_TABLET | Freq: Two times a day (BID) | ORAL | 5 refills | Status: DC | PRN
  Filled 2021-04-17: qty 60, 30d supply, fill #0
  Filled 2021-06-24: qty 60, 30d supply, fill #1

## 2020-08-20 MED ORDER — GABAPENTIN 600 MG PO TABS: 600.0000 mg | ORAL_TABLET | Freq: Three times a day (TID) | ORAL | 5 refills | Status: DC

## 2020-08-20 NOTE — Progress Notes (Signed)
Subjective:    Patient ID: Madison Collins, female    DOB: 03-Jan-1951, 70 y.o.   MRN: [id number]  HPI Pt is a 70 yr old female with hx of prediabetes, HTH, here for chronic pain evaluation- was sent for R knee pain/chronic pain evaluation..    Had R TKR in ~2015 Hasn't gone to Ortho to see why having R knee pain after R TKR.  Person that did her knee retired. With [employer]- still owe them some money.   B/L elbows- R>L- shattered R elbow in 2016/17- and full of pins and plates.  Aching pain- \Fluid in L elbow/olecranon- fx'd the L radius at elbow- 9/21.  Went to ED- in [place]- and was put in splint- wasn't told if needed surgery-   In last year, been through 3 providers.  Having trouble with continuity of care.   Neck pain- has "bulging disks"- has had cervical injections- 20-25 years ago. That will flare up when gets stressed- arms will get numb Doing dry needling- from PT- at [facility] therapy center. Plans to do aquatic therapy for that and other issues.   LBP- chronic pain.  Never had work up/injections for that- think it's "old age"  Has to keep on going- no matter if hurting or not.  7th month in motel   Doesn't sleep well and has to get up and pee 1x+/night.   Was told [facility] limited PCP's NP to 5 days supply.    Tried: On gabapentin 400 mg TID- can tell when doesn't take-  On Celexa for mood, but not pain.  Kidney look good- takes Meloxicam 15 mg daily. - been on it a long time.  Never tried muscle relaxants.  However has really tight muscles- "like squeezing, so tight".  Dry needling- hurt immediately, but that evening, was much looser- arms weren't so numb.  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       Pain Inventory Average Pain 7 Pain Right  Now 8 My pain is aching  In the last 24 hours, has pain interfered with the following? General activity 7 Relation with others 8 Enjoyment of life 9 What TIME of day is your pain at its worst? morning  and night Sleep (in general) Fair  Pain is worse with: walking, standing, and some activites Pain improves with: pacing activities and medication Relief from Meds: 9  walk without assistance ability to climb steps?  yes do you drive?  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  yes  weakness numbness tingling depression  Any changes since last visit?  no  Primary care Madison Reedy NP    Family History  Problem Relation Age of Onset   Cancer Mother        Lung with brain mets   Diabetes Mother    Stroke Father 64   Diabetes Father    Colon cancer Father 21       colorectal/anal cancer   Hypertension Brother    Bipolar disorder Daughter    Bipolar disorder Son    Diabetes Maternal Grandfather    Heart disease Maternal Grandfather    Emphysema Paternal Grandmother    Cancer Paternal Grandfather  stomach   Breast cancer Maternal Aunt    Social History   Socioeconomic History   Marital status: Widowed    Spouse name: Not on file   Number of children: Not on file   Years of education: Not on file   Highest education level: Not on file  Occupational History   Occupation: Astronomer  Tobacco Use   Smoking status: Never   Smokeless tobacco: Never  Vaping Use   Vaping Use: Never used  Substance and Sexual Activity   Alcohol use: Yes    Comment: a glass of wine/rarely   Drug use: Yes    Comment: Heroin on 03/17/2020   Sexual activity: Yes    Birth control/protection: Post-menopausal  Other Topics Concern   Not on file  Social History Narrative   Marital status: Widower since 2009 from Hepatitis C; + Significant other.       Children:  2 children; 5 grandchildren; no gg.      Lives: with  significant Madison Collins).      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       Exercise: no formal exercise; very busy with grandchildren and work.   Social Determinants of Health   Financial Resource Strain: Not on file  Food Insecurity: Not on file  Transportation Needs: Not on file  Physical Activity: Not on file  Stress: Not on file  Social Connections: Not on file   Past Surgical History:  Procedure Laterality Date   BREAST BIOPSY Left    BUNIONECTOMY Right 1983   COLONOSCOPY  10/22/13   EYE SURGERY Right 2010   hole in macula   FRACTURE SURGERY N/A    Phreesia 04/14/2020   JOINT REPLACEMENT N/A    Phreesia 04/14/2020   KNEE SURGERY Right     x4   ORIF ELBOW FRACTURE Right 04/08/2015   Procedure: OPEN REDUCTION INTERNAL FIXATION (ORIF) ELBOW/OLECRANON FRACTURE;  Surgeon: Milly Jakob, MD;  Location: [facility];  Service: Orthopedics;  Laterality: Right;   ORIF RADIAL FRACTURE Right 04/08/2015   Procedure: OPEN REDUCTION INTERNAL FIXATION (ORIF) RADIAL HEAD ;  Surgeon: Milly Jakob, MD;  Location: [facility];  Service: Orthopedics;  Laterality: Right;   OVARIAN CYST REMOVAL     TOTAL KNEE ARTHROPLASTY Right 12/24/2013   Procedure: RIGHT TOTAL KNEE ARTHROPLASTY;  Surgeon: Kerin Salen, MD;  Location: [facility];  Service: Orthopedics;  Laterality: Right;   TUBAL LIGATION  1987   Past Medical History:  Diagnosis Date   Anxiety    Anxiety and depression    Arthritis    Bulging disc 1990's   c4-5   Cataract    right eye   Class 1 obesity due to excess calories without serious comorbidity with body mass index (BMI) of 30.0 to 30.9 in adult 08/05/2016   Depression    GERD (gastroesophageal reflux disease)    H/O: osteoarthritis    Headache    migraines occasionally   Hepatitis C    IA viral load low 02/09    BX past mild fibrosis   Hypertension    Insomnia, unspecified     Knee pain    Post-menopause 2010   BP 137/73   Pulse 73   Temp 98.1 F (36.7 C)   Ht 5' (1.524 m)   Wt 170 lb  9.6 oz (77.4 kg)   SpO2 98%   BMI 33.32 kg/m   Opioid Risk Score:   Fall Risk Score:  `1  Depression screen PHQ 2/9  Depression screen [facility] 2/9 08/20/2020 07/29/2020 04/15/2020 03/24/2020 01/29/2020 07/10/2019 04/06/2019  Decreased Interest 1 0 0 0 0 0 0  Down, Depressed, Hopeless 1 1 0 0 0 1 0  PHQ - 2 Score 2 1 0 0 0 1 0  Altered sleeping 3 1 - - - 1 0  Tired, decreased energy 0 2 - - - 1 1  Change in appetite 0 1 - - - 1 1  Feeling bad or failure about yourself  0 0 - - - 0 0  Trouble concentrating 0 0 - - - 0 0  Moving slowly or fidgety/restless 0 0 - - - 0 0  Suicidal thoughts 0 0 - - - 0 0  PHQ-9 Score 5 5 - - - 4 2  Difficult doing work/chores - Somewhat difficult - - - - Not difficult at all  Some recent data might be hidden    Review of Systems  Constitutional: Negative.   HENT: Negative.    Eyes: Negative.   Respiratory: Negative.    Cardiovascular: Negative.   Gastrointestinal: Negative.   Endocrine: Negative.   Genitourinary: Negative.   Musculoskeletal:  Positive for arthralgias, back pain, gait problem and neck pain.       Has pins and plate in right elbow/ 5 surgeries on right knee  Skin: Negative.   Allergic/Immunologic: Negative.   Neurological:  Positive for weakness and numbness.       Tingling  Hematological: Negative.   Psychiatric/Behavioral:  Positive for dysphoric mood.   All other systems reviewed and are negative.     Objective:   Physical Exam  Awake alert, appropriate, stressed, a little depressed/anxious, NAD Olecranon swelling on L elbow Mild finger swelling B/L MS: Ue's 5/5 in deltoids, biceps, triceps, WE, grip and finger abd- but shoulder/elbow ROM causes pain LE's- HF 5-/5, KE/KF on R 4+/5, and L 5/5, DF and PF 5/5 B/L- mainly pain with R knee ROM R knee effusion- right below patellar area.  Neuro: Decreased sensation  over R knee- otherwise intact to light touch in all 4 extremities.       Assessment & Plan:   Pt is a 70 yr old female with hx of prediabetes, HTH, here for chronic pain evaluation- was sent for R knee pain/chronic pain evaluation.Marland Kitchen Hx of accidental heroin overdose ( trying to figure what daughter was taking and sniffed).   Opiate drug screen and opiate contract.  Increase gabapentin  600 mg 3x/day. Start increase gabapentin first! Gabapentin- (just got 400 mg filled- take 1 tab in AM, afternoon and 2 at night)  Try to change your meloxicam to Diclofenac 75 mg  2x/day. Norco 10/325 mg q6 hours as needed- for pain #40 tabs- until I get back from vacation-  Flexeril /Cyclobenzaprine 10 mg 2x/day as needed- use up to 4x/week at max- basically for bad days.  Magnesium is  over the counter- 400 mg and take 1-3 tabs/day- only side effect is looser stools- can be taken all at once or spread out.  Tennis balls- hold pressure 2-4 minutes- don't massage- for neck, back and back of thighs- 5-10 minutes for buttocks- push against harder surface to hold the pressure.  Lidocaine patches over the counter- 4% over the counter- put on neck/side of neck and shoulders both sides do at night.  Wear up to 12 hours.  F/u in 2 months- think about doing trigger point injections.   I spent a total of 45 minutes on visit-going over new meds regimen and going over/demonstrating tennis ball.

## 2020-08-20 NOTE — Addendum Note (Signed)
Addended by: Caro Hight on: 08/20/2020 10:25 AM   Modules accepted: Orders

## 2020-08-20 NOTE — Patient Instructions (Addendum)
Pt is a 70 yr old female with hx of prediabetes, HTH, here for chronic pain evaluation- was sent for R knee pain/chronic pain evaluation.Marland Kitchen Hx of accidental heroin overdose ( trying to figure what daughter was taking and sniffed).   Opiate drug screen and opiate contract.  Increase gabapentin  600 mg 3x/day. Start increase gabapentin first! Gabapentin- (just got 400 mg filled- take 1 tab in AM, afternoon and 2 at night)  Try to change your meloxicam to Diclofenac 75 mg  2x/day.- don't take both together and this is an anti-inflammatory. No Naproxen or Aleve or Ibuprofen Norco 10/325 mg q6 hours as needed- for pain #40 tabs- until I get back from vacation-  Flexeril /Cyclobenzaprine 10 mg 2x/day as needed- use up to 4x/week at max- basically for bad days.  Magnesium is  over the counter- 400 mg and take 1-3 tabs/day- only side effect is looser stools- can be taken all at once or spread out.  Tennis balls- hold pressure 2-4 minutes- don't massage- for neck, back and back of thighs- 5-10 minutes for buttocks- push against harder surface to hold the pressure.  Lidocaine patches over the counter- 4% over the counter- put on neck/side of neck and shoulders both sides do at night. Wear up to 12 hours.  F/u in 2 months- might need trigger point injections Start 1 medicine every 1-2 days. So will now if something is an issue.

## 2020-08-22 ENCOUNTER — Ambulatory Visit (HOSPITAL_BASED_OUTPATIENT_CLINIC_OR_DEPARTMENT_OTHER): Payer: PPO | Admitting: Physical Therapy

## 2020-08-23 LAB — TOXASSURE SELECT,+ANTIDEPR,UR

## 2020-08-26 ENCOUNTER — Encounter (HOSPITAL_BASED_OUTPATIENT_CLINIC_OR_DEPARTMENT_OTHER): Payer: Self-pay | Admitting: Physical Therapy

## 2020-08-26 ENCOUNTER — Ambulatory Visit (HOSPITAL_BASED_OUTPATIENT_CLINIC_OR_DEPARTMENT_OTHER): Payer: PPO | Attending: Nurse Practitioner | Admitting: Physical Therapy

## 2020-08-26 ENCOUNTER — Other Ambulatory Visit: Payer: Self-pay

## 2020-08-26 DIAGNOSIS — M6281 Muscle weakness (generalized): Secondary | ICD-10-CM | POA: Diagnosis not present

## 2020-08-26 DIAGNOSIS — M25561 Pain in right knee: Secondary | ICD-10-CM | POA: Insufficient documentation

## 2020-08-26 DIAGNOSIS — G8929 Other chronic pain: Secondary | ICD-10-CM | POA: Diagnosis not present

## 2020-08-26 DIAGNOSIS — R262 Difficulty in walking, not elsewhere classified: Secondary | ICD-10-CM

## 2020-08-26 DIAGNOSIS — M25521 Pain in right elbow: Secondary | ICD-10-CM | POA: Diagnosis not present

## 2020-08-26 NOTE — Therapy (Signed)
[facility] [address] [place], [place], [address] Phone: [phone]   Fax:  [phone]  Physical Therapy Treatment  Patient Details  Name: Madison Collins MRN: [id number] Date of Birth: 03-15-1950 Referring Provider (PT): Jacolyn Reedy, NP   Encounter Date: 08/26/2020   PT End of Session - 08/26/20 1407     Visit Number 2    Number of Visits 25    Date for PT Re-Evaluation 11/14/20    Authorization Type [employer]    PT Start Time 1153    PT Stop Time 1230    PT Time Calculation (min) 37 min    Activity Tolerance Patient tolerated treatment well    Behavior During Therapy [facility] for tasks assessed/performed             Past Medical History:  Diagnosis Date   Anxiety    Anxiety and depression    Arthritis    Bulging disc 1990's   c4-5   Cataract    right eye   Class 1 obesity due to excess calories without serious comorbidity with body mass index (BMI) of 30.0 to 30.9 in adult 08/05/2016   Depression    GERD (gastroesophageal reflux disease)    H/O: osteoarthritis    Headache    migraines occasionally   Hepatitis C    IA viral load low 02/09    BX past mild fibrosis   Hypertension    Insomnia, unspecified    Knee pain    Post-menopause 2010    Past Surgical History:  Procedure Laterality Date   BREAST BIOPSY Left    BUNIONECTOMY Right 1983   COLONOSCOPY  10/22/13   EYE SURGERY Right 2010   hole in macula   FRACTURE SURGERY N/A    Phreesia 04/14/2020   JOINT REPLACEMENT N/A    Phreesia 04/14/2020   KNEE SURGERY Right     x4   ORIF ELBOW FRACTURE Right 04/08/2015   Procedure: OPEN REDUCTION INTERNAL FIXATION (ORIF) ELBOW/OLECRANON FRACTURE;  Surgeon: Milly Jakob, MD;  Location: [place];  Service: Orthopedics;  Laterality: Right;   ORIF RADIAL FRACTURE Right 04/08/2015   Procedure: OPEN REDUCTION INTERNAL FIXATION (ORIF) RADIAL HEAD ;  Surgeon: Milly Jakob, MD;  Location: [place];   Service: Orthopedics;  Laterality: Right;   OVARIAN CYST REMOVAL     TOTAL KNEE ARTHROPLASTY Right 12/24/2013   Procedure: RIGHT TOTAL KNEE ARTHROPLASTY;  Surgeon: Kerin Salen, MD;  Location: [place];  Service: Orthopedics;  Laterality: Right;   TUBAL LIGATION  1987    There were no vitals filed for this visit.   Subjective Assessment - 08/26/20 1157     Subjective Made changes in meds so I have felt better in this last week. Walked a lot yesterday and went to fireworks which hurt my lower back. Knee and elbow hurt at night but not as bad during the day. I haven't felt as tense through shoulders.    Patient Stated Goals strength to get up and down, decr elbow swelling, strengthen Rt UE, childcare activities    Currently in Pain? Yes    Pain Score 6     Pain Location --   general pain   Pain Orientation Right    Pain Descriptors / Indicators --   elbow, knee, upper trap                              [facility]  Adult PT Treatment/Exercise - 08/26/20 0001       Exercises   Exercises Wrist;Knee/Hip      Neck Exercises: Seated   Other Seated Exercise triceps ext yellow tband      Knee/Hip Exercises: Stretches   Other Knee/Hip Stretches hamstrings, adductors, hip flexors/gastroc      Wrist Exercises   Other wrist exercises eccentric lowering 1lb      Manual Therapy   Manual therapy comments skilled palpation and monitoring during TPDN    Joint Mobilization elbow distraction & lateral mobs    Soft tissue mobilization Rt wrist extensors              Trigger Point Dry Needling - 08/26/20 0001     Other Dry Needling Rt wrist extensor group                    PT Short Term Goals - 08/18/20 2114       PT SHORT TERM GOAL #1   Title pt will verbalize ability to utilize proper scapular motion to improve postures in daily activities    Baseline will cont to educate    Time 3    Period Weeks    Status New    Target Date 09/12/20      PT SHORT TERM  GOAL #2   Title pt will be able to work exercises into her daily activities for regular stretching    Baseline denies time to do any stretches/exercises right now    Time 3    Period Weeks    Status New    Target Date 09/12/20               PT Long Term Goals - 08/18/20 2117       PT LONG TERM GOAL #1   Title average pain <=5/10    Baseline 9/10 at rest at eval    Time 12    Period Weeks    Status New    Target Date 11/14/20      PT LONG TERM GOAL #2   Title pt will be able to get up and down from the floor with min assist from external objects    Baseline reports significant difficulty at eval    Time 12    Period Weeks    Status New    Target Date 11/14/20      PT LONG TERM GOAL #3   Title Rt UE will demo strength to support utilization of dominant UE through ADLs    Baseline finds that she is using her Lt UE moreso    Time 12    Period Weeks    Status New    Target Date 11/14/20                   Plan - 08/26/20 1402     Clinical Impression Statement Looked at xrays from elbow fracture and discussed changes in length-tension relationships of muscles and decreased support from lengthened ligaments. she is active so we added more stretches to incoorporate into her day for improved flexibility.    PT Treatment/Interventions ADLs/Self Care Home Management;Cryotherapy;Electrical Stimulation;Aquatic Therapy;Gait training;Ultrasound;Traction;Moist Heat;Iontophoresis 4mg /ml Dexamethasone;Stair training;Functional mobility training;Therapeutic activities;Therapeutic exercise;Balance training;Neuromuscular re-education;Manual techniques;Patient/family education;Passive range of motion;Dry needling;Joint Manipulations;Spinal Manipulations;Taping    PT Next Visit Plan review stretches, cont with e3lbow strengthening    Consulted and Agree with Plan of Care Patient             Patient  will benefit from skilled therapeutic intervention in order to improve the  following deficits and impairments:  Difficulty walking, Impaired UE functional use, Increased muscle spasms, Decreased activity tolerance, Pain, Improper body mechanics, Decreased strength, Postural dysfunction, Impaired sensation  Visit Diagnosis: Pain in right elbow  Chronic pain of right knee  Muscle weakness (generalized)  Difficulty in walking, not elsewhere classified     Problem List Patient Active Problem List   Diagnosis Date Noted   Encounter to establish care 07/29/2020   Chest pain, rule out acute myocardial infarction 03/18/2020   Heroin overdose, accidental or unintentional, initial encounter ([place]) 03/18/2020   Diabetes ([place]) 02/11/2020   Osteopenia 01/05/2019   Pure hypercholesterolemia 10/05/2018   Chronic pain syndrome 08/05/2016   Glucose intolerance 08/05/2016   Arthritis of knee, right 12/23/2013   Hepatitis C virus infection without hepatic coma 05/19/2011   Migraine 05/19/2011   Insomnia 05/19/2011   Chronic knee pain 05/19/2011   Depression with anxiety 05/19/2011   Cervical disc syndrome 05/19/2011   Gastroesophageal reflux 05/19/2011   Tarri Guilfoil C. Shyne Lehrke PT, DPT 08/26/20 2:09 PM   [facility] [place], [place], [address] Phone: [phone]   Fax:  [phone]  Name: Madison Collins MRN: [id number] Date of Birth: 1950/04/08

## 2020-08-28 ENCOUNTER — Telehealth: Payer: Self-pay | Admitting: *Deleted

## 2020-08-28 NOTE — Telephone Encounter (Signed)
Urine drug screen is positive for methamphetamine. There is no hx of being prescribed an amphetamine type medication, and amphetamine is a metabolite of meth. There is ethyl glucuronide present but no ethyl sulfate to confirm alcohol and she has had elevated blood sugars in past.

## 2020-08-29 ENCOUNTER — Ambulatory Visit (HOSPITAL_BASED_OUTPATIENT_CLINIC_OR_DEPARTMENT_OTHER): Payer: PPO | Admitting: Physical Therapy

## 2020-09-01 NOTE — Telephone Encounter (Signed)
Please send a formal letter discharging from the clinic- thank you- ML

## 2020-09-02 ENCOUNTER — Ambulatory Visit (HOSPITAL_BASED_OUTPATIENT_CLINIC_OR_DEPARTMENT_OTHER): Payer: PPO | Admitting: Physical Therapy

## 2020-09-02 ENCOUNTER — Other Ambulatory Visit: Payer: Self-pay

## 2020-09-02 ENCOUNTER — Encounter (HOSPITAL_BASED_OUTPATIENT_CLINIC_OR_DEPARTMENT_OTHER): Payer: Self-pay | Admitting: Physical Therapy

## 2020-09-02 DIAGNOSIS — M6281 Muscle weakness (generalized): Secondary | ICD-10-CM

## 2020-09-02 DIAGNOSIS — M25521 Pain in right elbow: Secondary | ICD-10-CM | POA: Diagnosis not present

## 2020-09-02 DIAGNOSIS — G8929 Other chronic pain: Secondary | ICD-10-CM

## 2020-09-02 DIAGNOSIS — R262 Difficulty in walking, not elsewhere classified: Secondary | ICD-10-CM

## 2020-09-02 NOTE — Therapy (Addendum)
[facility] [address] [place], [place], [address] Phone: [phone]   Fax:  [phone]  Physical Therapy Treatment  Patient Details  Name: Madison Collins MRN: [id number] Date of Birth: 03-11-1950 Referring Provider (PT): Jacolyn Reedy, NP   Encounter Date: 09/02/2020   PT End of Session - 09/02/20 1103     Visit Number 3    Number of Visits 25    Date for PT Re-Evaluation 11/14/20    Authorization Type [employer]    PT Start Time 1102    PT Stop Time 1145    PT Time Calculation (min) 43 min    Activity Tolerance Patient tolerated treatment well    Behavior During Therapy [facility] for tasks assessed/performed             Past Medical History:  Diagnosis Date   Anxiety    Anxiety and depression    Arthritis    Bulging disc 1990's   c4-5   Cataract    right eye   Class 1 obesity due to excess calories without serious comorbidity with body mass index (BMI) of 30.0 to 30.9 in adult 08/05/2016   Depression    GERD (gastroesophageal reflux disease)    H/O: osteoarthritis    Headache    migraines occasionally   Hepatitis C    IA viral load low 02/09    BX past mild fibrosis   Hypertension    Insomnia, unspecified    Knee pain    Post-menopause 2010    Past Surgical History:  Procedure Laterality Date   BREAST BIOPSY Left    BUNIONECTOMY Right 1983   COLONOSCOPY  10/22/13   EYE SURGERY Right 2010   hole in macula   FRACTURE SURGERY N/A    Phreesia 04/14/2020   JOINT REPLACEMENT N/A    Phreesia 04/14/2020   KNEE SURGERY Right     x4   ORIF ELBOW FRACTURE Right 04/08/2015   Procedure: OPEN REDUCTION INTERNAL FIXATION (ORIF) ELBOW/OLECRANON FRACTURE;  Surgeon: Milly Jakob, MD;  Location: [facility];  Service: Orthopedics;  Laterality: Right;   ORIF RADIAL FRACTURE Right 04/08/2015   Procedure: OPEN REDUCTION INTERNAL FIXATION (ORIF) RADIAL HEAD ;  Surgeon: Milly Jakob, MD;  Location: [facility];   Service: Orthopedics;  Laterality: Right;   OVARIAN CYST REMOVAL     TOTAL KNEE ARTHROPLASTY Right 12/24/2013   Procedure: RIGHT TOTAL KNEE ARTHROPLASTY;  Surgeon: Kerin Salen, MD;  Location: [place];  Service: Orthopedics;  Laterality: Right;   TUBAL LIGATION  1987    There were no vitals filed for this visit.   Subjective Assessment - 09/02/20 1103     Subjective ankle are swelling with new medication changes. Feeling a little better with DN and meds. lower back is achey by the end of the day and some catching- denies radicular symptoms.    Patient Stated Goals strength to get up and down, decr elbow swelling, strengthen Rt UE, childcare activities    Currently in Pain? Yes    Pain Score 6     Pain Location Back    Pain Orientation Right;Left;Lower    Pain Descriptors / Indicators Aching    Aggravating Factors  standing all day    Pain Relieving Factors rest                               [facility] Adult PT Treatment/Exercise - 09/02/20 0001  Knee/Hip Exercises: Stretches   Other Knee/Hip Stretches Hesch self correction for Lt post innom  x2      Knee/Hip Exercises: Standing   Other Standing Knee Exercises seated & standing ab set      Knee/Hip Exercises: Supine   Hip Adduction Isometric Limitations adduction + ab set with hands resting on rib cage    Bridges with [employer] 15 reps    Other Supine Knee/Hip Exercises supine shoulder flexion with ab set      Manual Therapy   Soft tissue mobilization Rt hip              Trigger Point Dry Needling - 09/02/20 0001     Muscles Treated Back/Hip Gluteus medius;Piriformis    Gluteus Medius Response Twitch response elicited;Palpable increased muscle length   Rt   Piriformis Response Twitch response elicited;Palpable increased muscle length   Rt                   PT Short Term Goals - 08/18/20 2114       PT SHORT TERM GOAL #1   Title pt will verbalize ability to utilize proper scapular  motion to improve postures in daily activities    Baseline will cont to educate    Time 3    Period Weeks    Status New    Target Date 09/12/20      PT SHORT TERM GOAL #2   Title pt will be able to work exercises into her daily activities for regular stretching    Baseline denies time to do any stretches/exercises right now    Time 3    Period Weeks    Status New    Target Date 09/12/20               PT Long Term Goals - 08/18/20 2117       PT LONG TERM GOAL #1   Title average pain <=5/10    Baseline 9/10 at rest at eval    Time 12    Period Weeks    Status New    Target Date 11/14/20      PT LONG TERM GOAL #2   Title pt will be able to get up and down from the floor with min assist from external objects    Baseline reports significant difficulty at eval    Time 12    Period Weeks    Status New    Target Date 11/14/20      PT LONG TERM GOAL #3   Title Rt UE will demo strength to support utilization of dominant UE through ADLs    Baseline finds that she is using her Lt UE moreso    Time 12    Period Weeks    Status New    Target Date 11/14/20                   Plan - 09/02/20 1153     Clinical Impression Statement Notable pelvic rotation corrected with Hesch self correction. Reports h/o hip issues when she was younger and Rt leg being slightly shorter. After pelvic correction, there was very minimal LLD noted so we will hold on heel lift. Decreased discomfort in Rt hip following DN to piriformis. Wil cont to work on core activation and stability to reduce rotaiton.    PT Treatment/Interventions ADLs/Self Care Home Management;Cryotherapy;Electrical Stimulation;Aquatic Therapy;Gait training;Ultrasound;Traction;Moist Heat;Iontophoresis 73m/ml Dexamethasone;Stair training;Functional mobility training;Therapeutic activities;Therapeutic exercise;Balance training;Neuromuscular re-education;Manual techniques;Patient/family education;Passive range of  motion;Dry  needling;Joint Manipulations;Spinal Manipulations;Taping    PT Next Visit Plan recheck pelvis, DN PRN, standing reaches with core    PT Home Exercise Plan [id number]    Consulted and Agree with Plan of Care Patient             Patient will benefit from skilled therapeutic intervention in order to improve the following deficits and impairments:  Difficulty walking, Impaired UE functional use, Increased muscle spasms, Decreased activity tolerance, Pain, Improper body mechanics, Decreased strength, Postural dysfunction, Impaired sensation  Visit Diagnosis: Pain in right elbow  Chronic pain of right knee  Muscle weakness (generalized)  Difficulty in walking, not elsewhere classified     Problem List Patient Active Problem List   Diagnosis Date Noted   Encounter to establish care 07/29/2020   Chest pain, rule out acute myocardial infarction 03/18/2020   Heroin overdose, accidental or unintentional, initial encounter ([place]) 03/18/2020   Diabetes ([place]) 02/11/2020   Osteopenia 01/05/2019   Pure hypercholesterolemia 10/05/2018   Chronic pain syndrome 08/05/2016   Glucose intolerance 08/05/2016   Arthritis of knee, right 12/23/2013   Hepatitis C virus infection without hepatic coma 05/19/2011   Migraine 05/19/2011   Insomnia 05/19/2011   Chronic knee pain 05/19/2011   Depression with anxiety 05/19/2011   Cervical disc syndrome 05/19/2011   Gastroesophageal reflux 05/19/2011    Tehya Leath C. Laveta Gilkey PT, DPT 09/02/20 11:56 AM   [facility] [place], [place], [address] Phone: [phone]   Fax:  [phone]  Name: Madison Collins MRN: [id number] Date of Birth: 12-09-1950   PHYSICAL THERAPY DISCHARGE SUMMARY  Visits from Start of Care: 3  Current functional level related to goals / functional outcomes: See above   Remaining deficits: See above   Education / Equipment: Anatomy of condition,  POC, HEP, exercise form/rationale   Patient agrees to discharge. Patient goals were not met. Patient is being discharged due to  excessive cancels. Wiliam Cauthorn C. Crucita Lacorte PT, DPT 10/24/20 4:38 PM

## 2020-09-03 ENCOUNTER — Encounter (HOSPITAL_BASED_OUTPATIENT_CLINIC_OR_DEPARTMENT_OTHER): Payer: PPO | Admitting: Physical Therapy

## 2020-09-05 ENCOUNTER — Ambulatory Visit (HOSPITAL_BASED_OUTPATIENT_CLINIC_OR_DEPARTMENT_OTHER): Payer: PPO | Admitting: Physical Therapy

## 2020-09-05 ENCOUNTER — Other Ambulatory Visit: Payer: Self-pay

## 2020-09-05 DIAGNOSIS — G8929 Other chronic pain: Secondary | ICD-10-CM

## 2020-09-05 DIAGNOSIS — M1711 Unilateral primary osteoarthritis, right knee: Secondary | ICD-10-CM

## 2020-09-05 DIAGNOSIS — G894 Chronic pain syndrome: Secondary | ICD-10-CM

## 2020-09-05 NOTE — Telephone Encounter (Signed)
Have attempted to get ahold of patient to let her know will not refill pain medicines due to methaphetamine in urine.  The phone was somehow hung up or disconnected 3x in a row- also tried in afternoon- did the same thing-  Will see if nursing can get ahold of pt- will NOT refill any more pain meds- pt is discharged form the clinic.

## 2020-09-08 NOTE — Telephone Encounter (Signed)
Letter sent via Medical laboratory scientific officer.

## 2020-09-09 ENCOUNTER — Other Ambulatory Visit: Payer: Self-pay

## 2020-09-09 DIAGNOSIS — G894 Chronic pain syndrome: Secondary | ICD-10-CM

## 2020-09-09 DIAGNOSIS — M1711 Unilateral primary osteoarthritis, right knee: Secondary | ICD-10-CM

## 2020-09-09 DIAGNOSIS — G8929 Other chronic pain: Secondary | ICD-10-CM

## 2020-09-10 ENCOUNTER — Encounter (HOSPITAL_BASED_OUTPATIENT_CLINIC_OR_DEPARTMENT_OTHER): Payer: PPO | Admitting: Physical Therapy

## 2020-09-12 ENCOUNTER — Ambulatory Visit (HOSPITAL_BASED_OUTPATIENT_CLINIC_OR_DEPARTMENT_OTHER): Payer: PPO | Admitting: Physical Therapy

## 2020-09-29 ENCOUNTER — Ambulatory Visit (HOSPITAL_BASED_OUTPATIENT_CLINIC_OR_DEPARTMENT_OTHER): Payer: PPO | Admitting: Nurse Practitioner

## 2020-10-06 ENCOUNTER — Ambulatory Visit (INDEPENDENT_AMBULATORY_CARE_PROVIDER_SITE_OTHER): Payer: PPO | Admitting: Nurse Practitioner

## 2020-10-06 ENCOUNTER — Ambulatory Visit: Payer: PPO | Admitting: Physical Medicine and Rehabilitation

## 2020-10-06 DIAGNOSIS — Z5329 Procedure and treatment not carried out because of patient's decision for other reasons: Secondary | ICD-10-CM

## 2020-10-06 NOTE — Progress Notes (Signed)
No show for f/u DM, HTN, HLD 10/06/2020

## 2020-11-03 ENCOUNTER — Ambulatory Visit: Payer: PPO | Admitting: Physical Medicine and Rehabilitation

## 2021-01-28 ENCOUNTER — Ambulatory Visit (INDEPENDENT_AMBULATORY_CARE_PROVIDER_SITE_OTHER): Payer: PPO | Admitting: Nurse Practitioner

## 2021-01-28 ENCOUNTER — Encounter (HOSPITAL_BASED_OUTPATIENT_CLINIC_OR_DEPARTMENT_OTHER): Payer: Self-pay | Admitting: Nurse Practitioner

## 2021-01-28 ENCOUNTER — Other Ambulatory Visit: Payer: Self-pay

## 2021-01-28 VITALS — BP 130/80 | HR 79 | Ht 60.0 in | Wt 161.2 lb

## 2021-01-28 DIAGNOSIS — E7800 Pure hypercholesterolemia, unspecified: Secondary | ICD-10-CM | POA: Diagnosis not present

## 2021-01-28 DIAGNOSIS — I152 Hypertension secondary to endocrine disorders: Secondary | ICD-10-CM

## 2021-01-28 DIAGNOSIS — K219 Gastro-esophageal reflux disease without esophagitis: Secondary | ICD-10-CM

## 2021-01-28 DIAGNOSIS — E1159 Type 2 diabetes mellitus with other circulatory complications: Secondary | ICD-10-CM

## 2021-01-28 DIAGNOSIS — G43009 Migraine without aura, not intractable, without status migrainosus: Secondary | ICD-10-CM | POA: Diagnosis not present

## 2021-01-28 DIAGNOSIS — Z23 Encounter for immunization: Secondary | ICD-10-CM | POA: Diagnosis not present

## 2021-01-28 DIAGNOSIS — F418 Other specified anxiety disorders: Secondary | ICD-10-CM

## 2021-01-28 DIAGNOSIS — G894 Chronic pain syndrome: Secondary | ICD-10-CM

## 2021-01-28 DIAGNOSIS — E782 Mixed hyperlipidemia: Secondary | ICD-10-CM | POA: Insufficient documentation

## 2021-01-28 DIAGNOSIS — E1165 Type 2 diabetes mellitus with hyperglycemia: Secondary | ICD-10-CM

## 2021-01-28 DIAGNOSIS — I10 Essential (primary) hypertension: Secondary | ICD-10-CM | POA: Diagnosis not present

## 2021-01-28 LAB — COMPREHENSIVE METABOLIC PANEL
ALT: 9 IU/L (ref 0–32)
AST: 14 IU/L (ref 0–40)
Albumin/Globulin Ratio: 1.9 (ref 1.2–2.2)
Albumin: 4.8 g/dL (ref 3.8–4.8)
Alkaline Phosphatase: 84 IU/L (ref 44–121)
BUN/Creatinine Ratio: 24 (ref 12–28)
BUN: 21 mg/dL (ref 8–27)
Bilirubin Total: 0.3 mg/dL (ref 0.0–1.2)
CO2: 24 mmol/L (ref 20–29)
Calcium: 9.5 mg/dL (ref 8.7–10.3)
Chloride: 99 mmol/L (ref 96–106)
Creatinine, Ser: 0.88 mg/dL (ref 0.57–1.00)
Globulin, Total: 2.5 g/dL (ref 1.5–4.5)
Glucose: 114 mg/dL — ABNORMAL HIGH (ref 70–99)
Potassium: 4.5 mmol/L (ref 3.5–5.2)
Sodium: 136 mmol/L (ref 134–144)
Total Protein: 7.3 g/dL (ref 6.0–8.5)
eGFR: 72 mL/min/{1.73_m2} (ref >59)

## 2021-01-28 LAB — CBC WITH DIFFERENTIAL/PLATELET
Basophils Absolute: 0 10*3/uL (ref 0.0–0.2)
Basos: 1 %
EOS (ABSOLUTE): 0.1 10*3/uL (ref 0.0–0.4)
Eos: 2 %
Hematocrit: 37 % (ref 34.0–46.6)
Hemoglobin: 12.6 g/dL (ref 11.1–15.9)
Immature Grans (Abs): 0 10*3/uL (ref 0.0–0.1)
Immature Granulocytes: 1 %
Lymphocytes Absolute: 2.1 10*3/uL (ref 0.7–3.1)
Lymphs: 25 %
MCH: 29.6 pg (ref 26.6–33.0)
MCHC: 34.1 g/dL (ref 31.5–35.7)
MCV: 87 fL (ref 79–97)
Monocytes Absolute: 0.6 10*3/uL (ref 0.1–0.9)
Monocytes: 6 %
Neutrophils Absolute: 5.8 10*3/uL (ref 1.4–7.0)
Neutrophils: 65 %
Platelets: 312 10*3/uL (ref 150–450)
RBC: 4.25 x10E6/uL (ref 3.77–5.28)
RDW: 14.1 % (ref 11.7–15.4)
WBC: 8.7 10*3/uL (ref 3.4–10.8)

## 2021-01-28 MED ORDER — LOVASTATIN 40 MG PO TABS
40.0000 mg | ORAL_TABLET | Freq: Every day | ORAL | 3 refills | Status: DC
Start: 2021-01-28 — End: 2021-10-21
  Filled 2021-05-12: qty 90, 90d supply, fill #0
  Filled 2021-08-19 – 2021-09-03 (×2): qty 90, 90d supply, fill #1

## 2021-01-28 MED ORDER — CHLORTHALIDONE 25 MG PO TABS
25.0000 mg | ORAL_TABLET | Freq: Every day | ORAL | 3 refills | Status: DC
Start: 2021-01-28 — End: 2021-10-21
  Filled 2021-05-12: qty 90, 90d supply, fill #0
  Filled 2021-08-19 – 2021-09-03 (×2): qty 90, 90d supply, fill #1

## 2021-01-28 MED ORDER — CITALOPRAM HYDROBROMIDE 40 MG PO TABS
40.0000 mg | ORAL_TABLET | Freq: Every morning | ORAL | 3 refills | Status: DC
Start: 2021-01-28 — End: 2021-10-21
  Filled 2021-06-15: qty 90, 90d supply, fill #0
  Filled 2021-09-17 – 2021-10-02 (×2): qty 90, 90d supply, fill #1

## 2021-01-28 MED ORDER — LOSARTAN POTASSIUM 100 MG PO TABS
100.0000 mg | ORAL_TABLET | Freq: Every day | ORAL | 3 refills | Status: DC
Start: 2021-01-28 — End: 2021-10-21
  Filled 2021-05-12: qty 90, 90d supply, fill #0
  Filled 2021-08-19 – 2021-09-03 (×2): qty 90, 90d supply, fill #1

## 2021-01-28 MED ORDER — METFORMIN HCL ER 500 MG PO TB24
1000.0000 mg | ORAL_TABLET | Freq: Every day | ORAL | 5 refills | Status: DC
Start: 2021-01-28 — End: 2021-10-21
  Filled 2021-09-03: qty 60, 30d supply, fill #0
  Filled 2021-10-02: qty 60, 30d supply, fill #1

## 2021-01-28 MED ORDER — SUMATRIPTAN SUCCINATE 100 MG PO TABS: 100.0000 mg | ORAL_TABLET | Freq: Once | ORAL | 5 refills | Status: DC

## 2021-01-28 MED ORDER — AMLODIPINE BESYLATE 5 MG PO TABS
5.0000 mg | ORAL_TABLET | Freq: Every day | ORAL | 3 refills | Status: DC
Start: 2021-01-28 — End: 2021-10-21
  Filled 2021-05-12: qty 90, 90d supply, fill #0
  Filled 2021-08-19 – 2021-09-03 (×2): qty 90, 90d supply, fill #1

## 2021-01-28 MED ORDER — OMEPRAZOLE 20 MG PO CPDR
20.0000 mg | DELAYED_RELEASE_CAPSULE | Freq: Every morning | ORAL | 3 refills | Status: DC
  Filled 2021-06-24: qty 90, 90d supply, fill #0
  Filled 2021-09-17 – 2021-10-02 (×2): qty 90, 90d supply, fill #1

## 2021-01-28 NOTE — Assessment & Plan Note (Signed)
Chronic pain currently managed by pain management. She is now off of the Norco and managed with cyclobenzaprine, diclofenac, and gabapentin which she reports is doing well for her.  Recommend continue management with pain clinic.

## 2021-01-28 NOTE — Assessment & Plan Note (Signed)
Migraine headaches appear well controlled with as needed Imitrex. Will send refill today. No alarm symptoms present today. Continue to follow.

## 2021-01-28 NOTE — Patient Instructions (Addendum)
I have sent refills in for you and we will check labs today to make sure that your kidney function and liver function look ok with your medication you are taking.   Thank you for letting me know about the phone lines- I will make sure this is looked into and addressed.

## 2021-01-28 NOTE — Assessment & Plan Note (Signed)
Currently managed with metformin 2000 mg daily. Based on patient request, will wait to monitor her A1c until next visit as she reports that she has not been managing her blood sugars well at home.  She is not checking her blood sugar at home therefore unsure of current control status.

## 2021-01-28 NOTE — Assessment & Plan Note (Signed)
Symptoms well controlled at this time. Refill provided on Prilosec today. No alarm symptoms. Will continue to monitor.

## 2021-01-28 NOTE — Assessment & Plan Note (Signed)
Currently managed with citalopram daily. Will provide refills today. Recommend counseling services for anxiety and stressors and to help with management and coping techniques. Follow-up if symptoms worsen or fail to improve.

## 2021-01-28 NOTE — Assessment & Plan Note (Signed)
Currently managed with the lovastatin. Will provide refills today. Will obtain labs in approximately 3 months for further evaluation.

## 2021-01-28 NOTE — Progress Notes (Signed)
Established Patient Office Visit  Subjective:  Patient ID: Madison Collins, female    DOB: 1950-06-08  Age: 70 y.o. MRN: [id number]  CC:  Chief Complaint  Patient presents with   Follow-up    Patient is here for follow up on medication, She needs RF'S on Imitrex,Norvasc,Hygroton,celexa. Patient would like a flu shot as well today. No other concerns for today's appointment.     HPI Madison Collins presents for follow-up for medication for blood pressure, depression, and migraines.  She would also like the flu shot today.  She has not been checking her blood pressure at home but has not had any symptoms of new headache, vision changes, palpitations, chest pain, dizziness. She has been out of her medication for approximately the past month after having difficulty getting in touch with the office to make an appointment. She is not experiencing any lower extremity edema or weakness. She reports that her mood is well controlled on her current dose of Celexa and has been stable.  She endorses chronic pain for which she is currently managed with cyclobenzaprine, diclofenac tablet, and gabapentin. She states that this medication has been significantly helpful for her.  She is no longer taking the Norco tablets.  She endorses intermittent migraine headaches which are well controlled with Imitrex as needed. She is not having any new or unusual symptoms.  She reports she has been taking her metformin however due to the holidays she requests that we not check her hemoglobin A1c at this visit but wait until next time for monitoring as she is concerned that her levels will be elevated.  Past Medical History:  Diagnosis Date   Anxiety    Anxiety and depression    Arthritis    Bulging disc 1990's   c4-5   Cataract    right eye   Class 1 obesity due to excess calories without serious comorbidity with body mass index (BMI) of 30.0 to 30.9 in adult 08/05/2016   Depression     GERD (gastroesophageal reflux disease)    H/O: osteoarthritis    Headache    migraines occasionally   Hepatitis C    IA viral load low 02/09    BX past mild fibrosis   Hypertension    Insomnia, unspecified    Knee pain    Post-menopause 2010    Past Surgical History:  Procedure Laterality Date   BREAST BIOPSY Left    BUNIONECTOMY Right 1983   COLONOSCOPY  10/22/13   EYE SURGERY Right 2010   hole in macula   FRACTURE SURGERY N/A    [employer]rees[employer] 04/14/2020   JOINT REPLACEMENT N/A    [employer]rees[employer] 04/14/2020   KNEE SURGERY Right     x4   ORIF ELBOW FRACTURE Right 04/08/2015   Procedure: OPEN REDUCTION INTERNAL FIXATION (ORIF) ELBOW/OLECRANON FRACTURE;  Surgeon: Milly Jakob, MD;  Location: [facility];  Service: Orthopedics;  Laterality: Right;   ORIF RADIAL FRACTURE Right 04/08/2015   Procedure: OPEN REDUCTION INTERNAL FIXATION (ORIF) RADIAL HEAD ;  Surgeon: Milly Jakob, MD;  Location: [facility];  Service: Orthopedics;  Laterality: Right;   OVARIAN CYST REMOVAL     TOTAL KNEE ARTHROPLASTY Right 12/24/2013   Procedure: RIGHT TOTAL KNEE ARTHROPLASTY;  Surgeon: Kerin Salen, MD;  Location: [facility];  Service: Orthopedics;  Laterality: Right;   TUBAL LIGATION  1987    Family History  Problem Relation Age of Onset   Cancer Mother        Lung with  brain mets   Diabetes Mother    Stroke Father 81   Diabetes Father    Colon cancer Father 54       colorectal/anal cancer   Hypertension Brother    Bipolar disorder Daughter    Bipolar disorder Son    Diabetes Maternal Grandfather    Heart disease Maternal Grandfather    Emphysema Paternal Grandmother    Cancer Paternal Grandfather        stomach   Breast cancer Maternal Aunt     Social History   Socioeconomic History   Marital status: Widowed    Spouse name: Not on file   Number of children: Not on file   Years of education: Not on file   Highest education level: Not on file  Occupational History   Occupation: Astronomer  Tobacco  Use   Smoking status: Never   Smokeless tobacco: Never  Vaping Use   Vaping Use: Never used  Substance and Sexual Activity   Alcohol use: Yes    Comment: a glass of wine/rarely   Drug use: Yes    Comment: Heroin on 03/17/2020   Sexual activity: Yes    Birth control/protection: Post-menopausal  Other Topics Concern   Not on file  Social History Narrative   Marital status: Widower since 2009 from Hepatitis C; + Significant other.       Children:  2 children; 5 grandchildren; no gg.      Lives: with significant Alycia Rossetti).      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       Exercise: no formal exercise; very busy with grandchildren and work.   Social Determinants of Health   Financial Resource Strain: Not on file  Food Insecurity: Not on file  Transportation Needs: Not on file  Physical Activity: Not on file  Stress: Not on file  Social Connections: Not on file  Intimate Partner Violence: Not on file    Outpatient Medications Prior to Visit  Medication Sig Dispense Refill   cyclobenzaprine (FLEXERIL) 10 MG tablet Take 1 tablet (10 mg total) by mouth 2 (two) times daily as needed for muscle spasms. 60 tablet 5   diclofenac (VOLTAREN) 75 MG EC tablet Take 1 tablet (75 mg total) by mouth 2 (two) times daily. 60 tablet 11   diclofenac Sodium (VOLTAREN) 1 % GEL Apply 4 g topically 4 (four) times daily. 100 g 5   fluticasone (FLONASE) 50 MCG/ACT nasal spray Place 2 sprays into both nostrils daily. 16 g 11   gabapentin (NEURONTIN) 600 MG tablet Take 1 tablet (600 mg total) by mouth 3 (three) times daily. 90 tablet 5   amLODipine (NORVASC) 5 MG tablet Take 1 tablet (5 mg total) by mouth daily. 90 tablet 3   chlorthalidone (HYGROTON) 25 MG tablet Take 1 tablet (25 mg total) by mouth daily. 90 tablet 1   citalopram (CELEXA) 40 MG tablet Take 1 tablet (40 mg total) by mouth  every morning. 90 tablet 1   HYDROcodone-acetaminophen (NORCO) 10-325 MG tablet Take 1 tablet by mouth every 6 (six) hours as needed for up to 40 doses for severe pain. DX: CHRONIC KNEE PAIN and CHRONIC BACK PAIN. 40 tablet 0   losartan (COZAAR) 100 MG tablet Take 1 tablet (100 mg total) by mouth daily. 90 tablet 3  lovastatin (MEVACOR) 40 MG tablet Take 1 tablet (40 mg total) by mouth at bedtime. 90 tablet 3   meloxicam (MOBIC) 15 MG tablet Take 1 tablet (15 mg total) by mouth daily. 90 tablet 3   metFORMIN (GLUCOPHAGE-XR) 500 MG 24 hr tablet Take 2 tablets (1,000 mg total) by mouth daily with breakfast. 60 tablet 5   omeprazole (PRILOSEC) 20 MG capsule Take 1 capsule (20 mg total) by mouth every morning. 90 capsule 3   SUMAtriptan (IMITREX) 100 MG tablet Take 1 tablet (100 mg total) by mouth once for 1 dose. May repeat in 2 hours if headache persists or recurs. 8 tablet 5   No facility-administered medications prior to visit.    Allergies  Allergen Reactions   Adhesive [Tape] Rash    "per pt - surgical tape, also causes itching   Latex Other (See Comments) and Rash    "Latex tape used during surgery - breaks my skin out"    ROS Review of Systems All review of systems negative except what is listed in the HPI    Objective:    Physical Exam Vitals and nursing note reviewed.  Constitutional:      Appearance: Normal appearance.  HENT:     Head: Normocephalic.  Eyes:     Extraocular Movements: Extraocular movements intact.     Conjunctiva/sclera: Conjunctivae normal.     Pupils: Pupils are equal, round, and reactive to light.  Neck:     Vascular: No carotid bruit.  Cardiovascular:     Rate and Rhythm: Normal rate and regular rhythm.     Pulses: Normal pulses.     Heart sounds: Normal heart sounds.  Pulmonary:     Effort: Pulmonary effort is normal.     Breath sounds: Normal breath sounds.  Musculoskeletal:     Cervical back: Normal range of motion.     Right lower leg:  No edema.     Left lower leg: No edema.  Skin:    General: Skin is warm and dry.     Capillary Refill: Capillary refill takes less than 2 seconds.  Neurological:     General: No focal deficit present.     Mental Status: She is alert and oriented to person, place, and time.     Cranial Nerves: No cranial nerve deficit.     Motor: No weakness.     Gait: Gait normal.  Psychiatric:        Mood and Affect: Mood normal.        Behavior: Behavior normal.        Thought Content: Thought content normal.        Judgment: Judgment normal.    BP 130/80   Pulse 79   Ht 5' (1.524 m)   Wt 161 lb 3.2 oz (73.1 kg)   SpO2 99%   BMI 31.48 kg/m  Wt Readings from Last 3 Encounters:  01/28/21 161 lb 3.2 oz (73.1 kg)  08/20/20 170 lb 9.6 oz (77.4 kg)  07/29/20 171 lb 9.6 oz (77.8 kg)     Health Maintenance Due  Topic Date Due   FOOT EXAM  Never done   OPHTHALMOLOGY EXAM  Never done   Zoster Vaccines- Shingrix (2 of 2) 08/17/2016   COLONOSCOPY (Pts 45-49yrs Insurance coverage will need to be confirmed)  10/23/2018   COVID-19 Vaccine (3 - Pfizer risk series) 09/21/2019   HEMOGLOBIN A1C  10/13/2020    There are no preventive care reminders to display for this patient.  Lab Results  Component Value Date   TSH 3.140 07/10/2019   Lab Results  Component Value Date   WBC 16.3 (H) 03/17/2020   HGB 12.3 03/17/2020   HCT 36.8 03/17/2020   MCV 88.5 03/17/2020   PLT 316 03/17/2020   Lab Results  Component Value Date   NA 138 04/15/2020   K 4.2 04/15/2020   CO2 20 04/15/2020   GLUCOSE 127 (H) 04/15/2020   BUN 17 04/15/2020   CREATININE 0.72 04/15/2020   BILITOT 0.3 04/15/2020   ALKPHOS 110 04/15/2020   AST 14 04/15/2020   ALT 13 04/15/2020   PROT 6.9 04/15/2020   ALBUMIN 4.4 04/15/2020   CALCIUM 9.3 04/15/2020   ANIONGAP 12 03/17/2020   Lab Results  Component Value Date   CHOL 207 (H) 04/15/2020   Lab Results  Component Value Date   HDL 65 04/15/2020   Lab Results   Component Value Date   LDLCALC 117 (H) 04/15/2020   Lab Results  Component Value Date   TRIG 142 04/15/2020   Lab Results  Component Value Date   CHOLHDL 3.2 04/15/2020   Lab Results  Component Value Date   HGBA1C 6.6 (H) 04/15/2020      Assessment & Plan:   Problem List Items Addressed This Visit     Chronic pain syndrome (Chronic)    Chronic pain currently managed by pain management. She is now off of the Norco and managed with cyclobenzaprine, diclofenac, and gabapentin which she reports is doing well for her.  Recommend continue management with pain clinic.      Relevant Medications   citalopram (CELEXA) 40 MG tablet   Other Relevant Orders   CBC with Differential/Platelet   Comprehensive metabolic panel   Hypertension associated with diabetes ([place]) - Primary   Relevant Medications   amLODipine (NORVASC) 5 MG tablet   chlorthalidone (HYGROTON) 25 MG tablet   losartan (COZAAR) 100 MG tablet   metFORMIN (GLUCOPHAGE-XR) 500 MG 24 hr tablet   lovastatin (MEVACOR) 40 MG tablet   Other Relevant Orders   CBC with Differential/Platelet   Comprehensive metabolic panel   Migraine    Migraine headaches appear well controlled with as needed Imitrex. Will send refill today. No alarm symptoms present today. Continue to follow.      Relevant Medications   amLODipine (NORVASC) 5 MG tablet   SUMAtriptan (IMITREX) 100 MG tablet   citalopram (CELEXA) 40 MG tablet   chlorthalidone (HYGROTON) 25 MG tablet   losartan (COZAAR) 100 MG tablet   lovastatin (MEVACOR) 40 MG tablet   Other Relevant Orders   CBC with Differential/Platelet   Comprehensive metabolic panel   Depression with anxiety    Currently managed with citalopram daily. Will provide refills today. Recommend counseling services for anxiety and stressors and to help with management and coping techniques. Follow-up if symptoms worsen or fail to improve.      Relevant Medications   citalopram (CELEXA) 40 MG  tablet   Other Relevant Orders   CBC with Differential/Platelet   Comprehensive metabolic panel   Gastroesophageal reflux    Symptoms well controlled at this time. Refill provided on Prilosec today. No alarm symptoms. Will continue to monitor.      Relevant Medications   omeprazole (PRILOSEC) 20 MG capsule   Pure hypercholesterolemia    Currently managed with the lovastatin. Will provide refills today. Will obtain labs in approximately 3 months for further evaluation.      Relevant Medications   amLODipine (NORVASC)  5 MG tablet   chlorthalidone (HYGROTON) 25 MG tablet   losartan (COZAAR) 100 MG tablet   lovastatin (MEVACOR) 40 MG tablet   Diabetes ([place])    Currently managed with metformin 2000 mg daily. Based on patient request, will wait to monitor her A1c until next visit as she reports that she has not been managing her blood sugars well at home.  She is not checking her blood sugar at home therefore unsure of current control status.      Relevant Medications   losartan (COZAAR) 100 MG tablet   metFORMIN (GLUCOPHAGE-XR) 500 MG 24 hr tablet   lovastatin (MEVACOR) 40 MG tablet   Other Visit Diagnoses     Encounter for immunization       Relevant Orders   Flu Vaccine QUAD High Dose(Fluad) (Completed)       Meds ordered this encounter  Medications   amLODipine (NORVASC) 5 MG tablet    Sig: Take 1 tablet (5 mg total) by mouth daily.    Dispense:  90 tablet    Refill:  3   SUMAtriptan (IMITREX) 100 MG tablet    Sig: Take 1 tablet (100 mg total) by mouth once for 1 dose. May repeat in 2 hours if headache persists or recurs.    Dispense:  8 tablet    Refill:  5   citalopram (CELEXA) 40 MG tablet    Sig: Take 1 tablet (40 mg total) by mouth every morning.    Dispense:  90 tablet    Refill:  3   chlorthalidone (HYGROTON) 25 MG tablet    Sig: Take 1 tablet (25 mg total) by mouth daily.    Dispense:  90 tablet    Refill:  3   losartan (COZAAR) 100 MG tablet     Sig: Take 1 tablet (100 mg total) by mouth daily.    Dispense:  90 tablet    Refill:  3   metFORMIN (GLUCOPHAGE-XR) 500 MG 24 hr tablet    Sig: Take 2 tablets (1,000 mg total) by mouth daily with breakfast.    Dispense:  60 tablet    Refill:  5   lovastatin (MEVACOR) 40 MG tablet    Sig: Take 1 tablet (40 mg total) by mouth at bedtime.    Dispense:  90 tablet    Refill:  3   omeprazole (PRILOSEC) 20 MG capsule    Sig: Take 1 capsule (20 mg total) by mouth every morning.    Dispense:  90 capsule    Refill:  3    Follow-up: Return in about 6 months (around 07/29/2021) for HTN, DM, HLD and labs.    Orma Render, NP

## 2021-02-24 ENCOUNTER — Encounter (HOSPITAL_BASED_OUTPATIENT_CLINIC_OR_DEPARTMENT_OTHER): Payer: Self-pay

## 2021-02-24 ENCOUNTER — Other Ambulatory Visit: Payer: Self-pay

## 2021-02-24 ENCOUNTER — Emergency Department (HOSPITAL_BASED_OUTPATIENT_CLINIC_OR_DEPARTMENT_OTHER)
Admission: EM | Admit: 2021-02-24 | Discharge: 2021-02-24 | Disposition: A | Discharge status: Home / Self Care | Payer: PPO | Attending: Emergency Medicine | Admitting: Emergency Medicine

## 2021-02-24 ENCOUNTER — Emergency Department (HOSPITAL_BASED_OUTPATIENT_CLINIC_OR_DEPARTMENT_OTHER): Payer: PPO | Admitting: Radiology

## 2021-02-24 DIAGNOSIS — S8992XA Unspecified injury of left lower leg, initial encounter: Secondary | ICD-10-CM | POA: Insufficient documentation

## 2021-02-24 DIAGNOSIS — Z91040 Latex allergy status: Secondary | ICD-10-CM | POA: Insufficient documentation

## 2021-02-24 DIAGNOSIS — M25562 Pain in left knee: Secondary | ICD-10-CM | POA: Diagnosis not present

## 2021-02-24 DIAGNOSIS — W109XXA Fall (on) (from) unspecified stairs and steps, initial encounter: Secondary | ICD-10-CM | POA: Diagnosis not present

## 2021-02-24 DIAGNOSIS — M79605 Pain in left leg: Secondary | ICD-10-CM | POA: Diagnosis not present

## 2021-02-24 NOTE — ED Triage Notes (Signed)
Patient here POV from Home with Left Knee Pain.  Patient slipped two weeks PTA and injured her Left Knee. Patient has Abrasion to left Knee and is complaining of Pain to Same.  No Blood-Thinning Medications. A&Ox4. GCS 15. Ambulatory. NAD Noted during Triage.

## 2021-02-24 NOTE — Discharge Instructions (Addendum)
Please follow-up with orthopedic provider attached to these discharge papers.  Tylenol, ibuprofen or Aleve are appropriate treatments for your knee pain.  Do not use ibuprofen and Aleve at the same time because they act similarly.  Be sure to take these medications with food to avoid stomach upset.

## 2021-02-24 NOTE — ED Notes (Signed)
Pt teaching provided on medications that may cause drowsiness. Pt instructed not to drive or operate heavy machinery while taking the prescribed medication. Pt verbalized understanding.   

## 2021-02-24 NOTE — ED Provider Notes (Signed)
[facility] EMERGENCY DEPT Provider Note   CSN: [id number] Arrival date & time: 02/24/21  1149     History { Chief Complaint  Patient presents with   Knee Pain    Madison Collins is a 71 y.o. female with a past medical history of chronic pain syndrome and arthritis of the knees presenting today with a complaint of left knee pain.  She reports that multiple weeks ago she fell down a flight of stairs at the motel in which she is living and obtained an abrasion to her knee.  Over the past week she has had more severe pain in the left knee and difficulty walking.  She denies any fevers or chills.  Had a total knee replacement on the right and reports osteoporosis.  Denies any numbness, tingling or decreased range of motion.   Home Medications Prior to Admission medications   Medication Sig Start Date End Date Taking? Authorizing Provider  amLODipine (NORVASC) 5 MG tablet Take 1 tablet (5 mg total) by mouth daily. 01/28/21   Orma Render, NP  chlorthalidone (HYGROTON) 25 MG tablet Take 1 tablet (25 mg total) by mouth daily. 01/28/21   Orma Render, NP  citalopram (CELEXA) 40 MG tablet Take 1 tablet (40 mg total) by mouth every morning. 01/28/21   Orma Render, NP  cyclobenzaprine (FLEXERIL) 10 MG tablet Take 1 tablet (10 mg total) by mouth 2 (two) times daily as needed for muscle spasms. 08/20/20   Lovorn, Jinny Blossom, MD  diclofenac (VOLTAREN) 75 MG EC tablet Take 1 tablet (75 mg total) by mouth 2 (two) times daily. 08/20/20   Lovorn, Jinny Blossom, MD  diclofenac Sodium (VOLTAREN) 1 % GEL Apply 4 g topically 4 (four) times daily. 07/29/20   Orma Render, NP  fluticasone (FLONASE) 50 MCG/ACT nasal spray Place 2 sprays into both nostrils daily. 07/10/19   Daleen Squibb, MD  gabapentin (NEURONTIN) 600 MG tablet Take 1 tablet (600 mg total) by mouth 3 (three) times daily. 08/20/20   Lovorn, Jinny Blossom, MD  losartan (COZAAR) 100 MG tablet Take 1 tablet (100 mg total) by mouth daily.  01/28/21   Orma Render, NP  lovastatin (MEVACOR) 40 MG tablet Take 1 tablet (40 mg total) by mouth at bedtime. 01/28/21   Orma Render, NP  metFORMIN (GLUCOPHAGE-XR) 500 MG 24 hr tablet Take 2 tablets (1,000 mg total) by mouth daily with breakfast. 01/28/21   Early, Coralee Pesa, NP  omeprazole (PRILOSEC) 20 MG capsule Take 1 capsule (20 mg total) by mouth every morning. 01/28/21   Orma Render, NP  SUMAtriptan (IMITREX) 100 MG tablet Take 1 tablet (100 mg total) by mouth once for 1 dose. May repeat in 2 hours if headache persists or recurs. 01/28/21 01/28/21  Orma Render, NP      Allergies    Adhesive [tape] and Latex    Review of Systems   Review of Systems  Constitutional:  Negative for chills and fever.  Musculoskeletal:  Positive for arthralgias and gait problem.  Skin:  Positive for wound.  Neurological:  Negative for weakness and numbness.   Physical Exam Updated Vital Signs BP (!) 144/61 (BP Location: Right Arm)    Pulse 85    Temp 98 F (36.7 C) (Oral)    Resp 16    Ht 5' (1.524 m)    Wt 73.1 kg    SpO2 100%    BMI 31.47 kg/m  Physical Exam Vitals and nursing note  reviewed.  Constitutional:      General: She is not in acute distress.    Appearance: Normal appearance. She is not ill-appearing.  HENT:     Head: Normocephalic and atraumatic.  Eyes:     General: No scleral icterus.    Conjunctiva/sclera: Conjunctivae normal.  Pulmonary:     Effort: Pulmonary effort is normal. No respiratory distress.  Musculoskeletal:        General: Swelling (Small amounts of inflammation to the anterior knee) and tenderness (Along the medial left knee) present. No deformity or signs of injury. Normal range of motion.  Skin:    General: Skin is warm and dry.     Capillary Refill: Capillary refill takes less than 2 seconds. Normal DP pulses bilaterally    Findings: No rash.     Comments: Quarter size abrasion to the left anterior knee.  No discharge or signs of infection  Neurological:      Mental Status: She is alert.  Psychiatric:        Mood and Affect: Mood normal.    ED Results / Procedures / Treatments   Labs (all labs ordered are listed, but only abnormal results are displayed) Labs Reviewed - No data to display  EKG None  Radiology DG Knee Complete 4 Views Left  Result Date: 02/24/2021 CLINICAL DATA:  Fall.  Left leg pain EXAM: LEFT KNEE - COMPLETE 4+ VIEW COMPARISON:  None. FINDINGS: Negative for fracture or effusion Mild degenerative change. Mild joint space narrowing laterally with associated spurring. Medial joint space intact. IMPRESSION: Negative for fracture. Electronically Signed   By: Franchot Gallo M.D.   On: 02/24/2021 12:25     Medications Ordered in ED Medications - No data to display  ED Course/ Medical Decision Making/ A&P                           Medical Decision Making  Patient presents to the ED for concern of left knee pain, this involves an extensive number of treatment options, and is a complaint that carries with it a high risk of complications and morbidity.  The differential diagnosis includes but is not limited to septic joint, fracture, ligamental or meniscal injury and effusion.   Co morbidities that complicate the patient evaluation  Diabetes   Imaging Studies ordered:  I ordered imaging studies including left knee x-ray which was negative.   Problem List / ED Course:  Left knee injury  Social Determinants of Health:  Currently living in a motel with 3 grandchildren.  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  High potential for continued overuse injuries.  Dispostion: Patient does have quarter sized abrasion to the left anterior knee however I have a low suspicion for septic joint, no warmth or erythema.  No systemic signs of infection.  After consideration of the diagnostic results and the patients response to treatment, I feel that the patent would benefit from a follow-up with Ortho for any  necessary further imaging.    Final Clinical Impression(s) / ED Diagnoses Final diagnoses:  Injury of left knee, initial encounter    Rx / DC Orders Results and diagnoses were explained to the patient. Return precautions discussed in full. Patient had no additional questions and expressed complete understanding.   This chart was dictated using voice recognition software.  Despite best efforts to proofread,  errors can occur which can change the documentation meaning.  Rhae Hammock, PA-C 02/24/21 [place], DO 02/24/21 1951

## 2021-02-27 DIAGNOSIS — M25562 Pain in left knee: Secondary | ICD-10-CM | POA: Diagnosis not present

## 2021-03-11 ENCOUNTER — Other Ambulatory Visit: Payer: Self-pay

## 2021-03-11 ENCOUNTER — Encounter (HOSPITAL_BASED_OUTPATIENT_CLINIC_OR_DEPARTMENT_OTHER): Payer: PPO | Attending: Internal Medicine | Admitting: Internal Medicine

## 2021-03-11 DIAGNOSIS — L97828 Non-pressure chronic ulcer of other part of left lower leg with other specified severity: Secondary | ICD-10-CM | POA: Insufficient documentation

## 2021-03-11 DIAGNOSIS — S81802A Unspecified open wound, left lower leg, initial encounter: Secondary | ICD-10-CM | POA: Diagnosis not present

## 2021-03-11 DIAGNOSIS — L97822 Non-pressure chronic ulcer of other part of left lower leg with fat layer exposed: Secondary | ICD-10-CM | POA: Diagnosis not present

## 2021-03-11 DIAGNOSIS — L97122 Non-pressure chronic ulcer of left thigh with fat layer exposed: Secondary | ICD-10-CM | POA: Diagnosis not present

## 2021-03-11 DIAGNOSIS — L97821 Non-pressure chronic ulcer of other part of left lower leg limited to breakdown of skin: Secondary | ICD-10-CM | POA: Insufficient documentation

## 2021-03-11 DIAGNOSIS — L02416 Cutaneous abscess of left lower limb: Secondary | ICD-10-CM | POA: Diagnosis not present

## 2021-03-11 NOTE — Progress Notes (Signed)
Madison Collins ([id number]) Visit Report for 03/11/2021 Abuse/Suicide Risk Screen Details Patient Name: Date of Service: Madison Collins, [place] Madison P. 03/11/2021 7:30 A M Medical Record Number: [id number] Patient Account Number: [id number] Date of Birth/Sex: Treating RN: Sep 15, 1950 (71 y.o. Madison Collins, Madison Collins Primary Care Madison Collins: Madison Collins Other Clinician: Referring Tawonna Esquer: Treating Madison Collins: Madison Collins in Treatment: 0 Abuse/Suicide Risk Screen Items Answer ABUSE RISK SCREEN: Has anyone close to you tried to hurt or harm you recentlyo No Do you feel uncomfortable with anyone in your familyo No Has anyone forced you do things that you didnt want to doo No Electronic Signature(s) Signed: 03/11/2021 12:02:35 PM By: Madison Pilling RN, BSN Entered By: Madison Collins on 03/11/2021 07:57:47 -------------------------------------------------------------------------------- Activities of Daily Living Details Patient Name: Date of Service: Madison Collins, [place] Madison P. 03/11/2021 7:30 A M Medical Record Number: [id number] Patient Account Number: [id number] Date of Birth/Sex: Treating RN: May 03, 1950 (71 y.o. Madison Collins, Madison Collins Primary Care Madison Collins: Madison Collins Other Clinician: Referring Channah Godeaux: Treating Simran Mannis/Extender: Madison Collins in Treatment: 0 Activities of Daily Living Items Answer Activities of Daily Living (Please select one for each item) Drive Automobile Completely Able T Medications ake Completely Able Use T elephone Completely Able Care for Appearance Completely Able Use T oilet Completely Able Bath / Shower Completely Able Dress Self Completely Able Feed Self Completely Able Walk Completely Able Get In / Out Bed Completely Able Housework Completely Able Prepare Meals Completely Able Handle Money Completely Able Shop for Self Completely Able Electronic Signature(s) Signed: 03/11/2021 12:02:35 PM By: Madison Pilling RN,  BSN Entered By: Madison Collins on 03/11/2021 07:58:01 -------------------------------------------------------------------------------- Education Screening Details Patient Name: Date of Service: Madison Corporal, Madison Madison P. 03/11/2021 7:30 A M Medical Record Number: [id number] Patient Account Number: [id number] Date of Birth/Sex: Treating RN: Jul 07, 1950 (71 y.o. Madison Collins, Madison Collins Primary Care Hung Collins: Madison Collins Other Clinician: Referring Madison Collins: Treating Madison Collins/Extender: Madison Collins in Treatment: 0 Primary Learner Assessed: Patient Learning Preferences/Education Level/Primary Language Learning Preference: Explanation, Demonstration, Printed Material Highest Education Level: College or Above Preferred Language: English Cognitive Barrier Language Barrier: No Translator Needed: No Memory Deficit: No Emotional Barrier: No Cultural/Religious Beliefs Affecting Medical Care: No Physical Barrier Impaired Vision: Yes Glasses, reading Impaired Hearing: No Decreased Hand dexterity: No Knowledge/Comprehension Knowledge Level: High Comprehension Level: High Ability to understand written instructions: High Ability to understand verbal instructions: High Motivation Anxiety Level: Calm Cooperation: Cooperative Education Importance: Acknowledges Need Interest in Health Problems: Asks Questions Perception: Coherent Willingness to Engage in Self-Management High Activities: Readiness to Engage in Self-Management High Activities: Electronic Signature(s) Signed: 03/11/2021 12:02:35 PM By: Madison Pilling RN, BSN Entered By: Madison Collins on 03/11/2021 07:58:25 -------------------------------------------------------------------------------- Fall Risk Assessment Details Patient Name: Date of Service: [name], Madison Madison P. 03/11/2021 7:30 A M Medical Record Number: [id number] Patient Account Number: [id number] Date of Birth/Sex: Treating RN: 04-02-1950 (71 y.o. Madison Collins, Madison Collins Primary Care Madison Collins: Madison Collins Other Clinician: Referring Madison Collins: Treating Madison Collins/Extender: Madison Collins in Treatment: 0 Fall Risk Assessment Items Have you had 2 or more falls in the last 12 monthso 0 No Have you had any fall that resulted in injury in the last 12 monthso 0 Yes FALLS RISK SCREEN History of falling - immediate or within 3 months 25 Yes Secondary diagnosis (Do you have 2 or more medical diagnoseso) 0 No Ambulatory aid None/bed rest/wheelchair/nurse 0 Yes Crutches/cane/walker 0 No Furniture 0 No Intravenous therapy  Access/Saline/Heparin Lock 0 No Gait/Transferring Normal/ bed rest/ wheelchair 0 Yes Weak (short steps with or without shuffle, stooped but able to lift head while walking, may seek 0 No support from furniture) Impaired (short steps with shuffle, may have difficulty arising from chair, head down, impaired 0 No balance) Mental Status Oriented to own ability 0 Yes Electronic Signature(s) Signed: 03/11/2021 12:02:35 PM By: Madison Pilling RN, BSN Entered By: Madison Collins on 03/11/2021 07:58:40 -------------------------------------------------------------------------------- Foot Assessment Details Patient Name: Date of Service: Madison Corporal, [place] Madison P. 03/11/2021 7:30 A M Medical Record Number: [id number] Patient Account Number: [id number] Date of Birth/Sex: Treating RN: 01/21/1951 (71 y.o. Madison Collins, Madison Collins Primary Care Eisen Robenson: Madison Collins Other Clinician: Referring Izan Collins: Treating Madison Collins/Extender: Madison Collins in Treatment: 0 Foot Assessment Items Site Locations + = Sensation present, - = Sensation absent, C = Callus, U = Ulcer R = Redness, W = Warmth, M = Maceration, PU = Pre-ulcerative lesion F = Fissure, S = Swelling, D = Dryness Assessment Right: Left: Other Deformity: No No Prior Foot Ulcer: No No Prior Amputation: No No Charcot Joint: No No Ambulatory Status: Ambulatory  Without Help Gait: Steady Notes No wound below the knee. did not assess foot. Electronic Signature(s) Signed: 03/11/2021 12:02:35 PM By: Madison Pilling RN, BSN Entered By: Madison Collins on 03/11/2021 08:15:11 -------------------------------------------------------------------------------- Nutrition Risk Screening Details Patient Name: Date of Service: Madison Collins, [place] Madison P. 03/11/2021 7:30 A M Medical Record Number: [id number] Patient Account Number: [id number] Date of Birth/Sex: Treating RN: 1950/11/30 (71 y.o. Debby Bud Primary Care Mashonda Broski: Madison Collins Other Clinician: Referring Mykayla Brinton: Treating Noelie Renfrow/Extender: Madison Collins in Treatment: 0 Height (in): Weight (lbs): Body Mass Index (BMI): Nutrition Risk Screening Items Score Screening NUTRITION RISK SCREEN: I have an illness or condition that made me change the kind and/or amount of food I eat 2 Yes I eat fewer than two meals per day 0 No I eat few fruits and vegetables, or milk products 0 No I have three or more drinks of beer, liquor or wine almost every day 0 No I have tooth or mouth problems that make it hard for me to eat 0 No I don't always have enough money to buy the food I need 0 No I eat alone most of the time 0 No I take three or more different prescribed or over-the-counter drugs a day 1 Yes Without wanting to, I have lost or gained 10 pounds in the last six months 0 No I am not always physically able to shop, cook and/or feed myself 0 No Nutrition Protocols Good Risk Protocol Moderate Risk Protocol 0 Provide education on nutrition High Risk Proctocol Risk Level: Moderate Risk Score: 3 Electronic Signature(s) Signed: 03/11/2021 12:02:35 PM By: Madison Pilling RN, BSN Entered By: Madison Collins on 03/11/2021 07:59:53

## 2021-03-12 NOTE — Progress Notes (Signed)
AVIENDHA, AZBELL ([id number]) Visit Report for 03/11/2021 Allergy List Details Patient Name: Date of Service: Madison Collins, [place] [name] 03/11/2021 7:30 A M Medical Record Number: [id number] Patient Account Number: [id number] Date of Birth/Sex: Treating RN: Mar 15, 1950 (71 y.o. Madison Collins Primary Care Helaine Yackel: Jacolyn Reedy Other Clinician: Referring Tyrin Herbers: Treating Margaree Sandhu/Extender: Arline Asp in Treatment: 0 Allergies Active Allergies adhesive tape Reaction: rash latex Reaction: rash Allergy Notes Electronic Signature(s) Signed: 03/11/2021 12:02:35 PM By: Deon Pilling RN, BSN Entered By: Deon Pilling on 03/11/2021 07:57:38 -------------------------------------------------------------------------------- Arrival Information Details Patient Name: Date of Service: [place] Corporal, [place] [name] 03/11/2021 7:30 A M Medical Record Number: [id number] Patient Account Number: [id number] Date of Birth/Sex: Treating RN: 04-Mar-1950 (71 y.o. Madison Collins, Madison Collins Primary Care Julious Langlois: Jacolyn Reedy Other Clinician: Referring Asiana Benninger: Treating Harold Mattes/Extender: Arline Asp in Treatment: 0 Visit Information Patient Arrived: Ambulatory Arrival Time: 07:54 Accompanied By: self Transfer Assistance: None Patient Identification Verified: Yes Secondary Verification Process Completed: Yes Patient Requires Transmission-Based Precautions: No Patient Has Alerts: No Electronic Signature(s) Signed: 03/11/2021 12:02:35 PM By: Deon Pilling RN, BSN Entered By: Deon Pilling on 03/11/2021 07:56:30 -------------------------------------------------------------------------------- Clinic Level of Care Assessment Details Patient Name: Date of Service: [facility], [place] [name] 03/11/2021 7:30 A M Medical Record Number: [id number] Patient Account Number: [id number] Date of Birth/Sex: Treating RN: 05-17-1950 (71 y.o. Madison Collins Primary Care Emerson Schreifels: Jacolyn Reedy Other Clinician: Referring Chavela Justiniano: Treating Aleysha Meckler/Extender: Arline Asp in Treatment: 0 Clinic Level of Care Assessment Items TOOL 1 Quantity Score X- 1 0 Use when EandM and Procedure is performed on INITIAL visit ASSESSMENTS - Nursing Assessment / Reassessment X- 1 20 General Physical Exam (combine w/ comprehensive assessment (listed just below) when performed on new pt. evals) X- 1 25 Comprehensive Assessment (HX, ROS, Risk Assessments, Wounds Hx, etc.) ASSESSMENTS - Wound and Skin Assessment / Reassessment X- 1 10 Dermatologic / Skin Assessment (not related to wound area) ASSESSMENTS - Ostomy and/or Continence Assessment and Care []  - 0 Incontinence Assessment and Management []  - 0 Ostomy Care Assessment and Management (repouching, etc.) PROCESS - Coordination of Care []  - 0 Simple Patient / Family Education for ongoing care X- 1 20 Complex (extensive) Patient / Family Education for ongoing care X- 1 10 Staff obtains Programmer, systems, Records, T Results / Process Orders est []  - 0 Staff telephones HHA, Nursing Homes / Clarify orders / etc []  - 0 Routine Transfer to another Facility (non-emergent condition) []  - 0 Routine Hospital Admission (non-emergent condition) X- 1 15 New Admissions / Biomedical engineer / Ordering NPWT Apligraf, etc. , []  - 0 Emergency Hospital Admission (emergent condition) PROCESS - Special Needs []  - 0 Pediatric / Minor Patient Management []  - 0 Isolation Patient Management []  - 0 Hearing / Language / Visual special needs []  - 0 Assessment of Community assistance (transportation, D/C planning, etc.) []  - 0 Additional assistance / Altered mentation []  - 0 Support Surface(s) Assessment (bed, cushion, seat, etc.) INTERVENTIONS - Miscellaneous []  - 0 External ear exam []  - 0 Patient Transfer (multiple staff / Civil Service fast streamer / Similar devices) []  - 0 Simple Staple / Suture removal (25 or less) []  - 0 Complex  Staple / Suture removal (26 or more) []  - 0 Hypo/Hyperglycemic Management (do not check if billed separately) []  - 0 Ankle / Brachial Index (ABI) - do not check if billed separately Has the patient been seen at the hospital within the last  three years: Yes Total Score: 100 Level Of Care: New/Established - Level 3 Electronic Signature(s) Signed: 03/12/2021 5:42:24 PM By: Dellie Catholic RN Signed: 03/12/2021 5:42:24 PM By: Dellie Catholic RN Entered By: Dellie Catholic on 03/11/2021 09:09:49 -------------------------------------------------------------------------------- Encounter Discharge Information Details Patient Name: Date of Service: Madison Corporal, [place] [name] 03/11/2021 7:30 A M Medical Record Number: [id number] Patient Account Number: [id number] Date of Birth/Sex: Treating RN: [birth year] (71 y.o. Madison Collins Primary Care Orren Pietsch: Jacolyn Reedy Other Clinician: Referring Rosser Collington: Treating Odester Nilson/Extender: Arline Asp in Treatment: 0 Encounter Discharge Information Items Post Procedure Vitals Discharge Condition: Stable Temperature (F): 97.9 Ambulatory Status: Ambulatory Pulse (bpm): 84 Discharge Destination: Home Respiratory Rate (breaths/min): 16 Transportation: Private Auto Blood Pressure (mmHg): 135/73 Accompanied By: friend Schedule Follow-up Appointment: Yes Clinical Summary of Care: Patient Declined Electronic Signature(s) Signed: 03/12/2021 5:42:24 PM By: Dellie Catholic RN Entered By: Dellie Catholic on 03/11/2021 09:40:29 -------------------------------------------------------------------------------- Lower Extremity Assessment Details Patient Name: Date of Service: [facility], [place] [name] 03/11/2021 7:30 A M Medical Record Number: [id number] Patient Account Number: [id number] Date of Birth/Sex: Treating RN: 09-18-1950 (71 y.o. Madison Collins Primary Care Ellagrace Yoshida: Jacolyn Reedy Other Clinician: Referring Mitzie Marlar: Treating  Jetty Berland/Extender: Arline Asp in Treatment: 0 Notes wounds on left knee. Electronic Signature(s) Signed: 03/11/2021 12:02:35 PM By: Deon Pilling RN, BSN Entered By: Deon Pilling on 03/11/2021 08:07:38 -------------------------------------------------------------------------------- Multi Wound Chart Details Patient Name: Date of Service: Madison Corporal, [place] [name] 03/11/2021 7:30 A M Medical Record Number: [id number] Patient Account Number: [id number] Date of Birth/Sex: Treating RN: August 21, 1950 (71 y.o. Madison Collins, [name] Primary Care Pascha Fogal: Jacolyn Reedy Other Clinician: Referring Adaleen Hulgan: Treating Helon Wisinski/Extender: Arline Asp in Treatment: 0 Vital Signs Height(in): 60 Pulse(bpm): 16 Weight(lbs): 160 Blood Pressure(mmHg): 135/73 Body Mass Index(BMI): 31 Temperature(F): 97.9 Respiratory Rate(breaths/min): 16 Photos: Left, Distal Knee Left, Proximal Knee Left, Medial Upper Leg Wound Location: Trauma Trauma Trauma Wounding Event: Abscess Abrasion Abrasion Primary Etiology: Cataracts, Hypertension, Hepatitis C, Cataracts, Hypertension, Hepatitis C, Cataracts, Hypertension, Hepatitis C, Comorbid History: Osteoarthritis, Confinement Anxiety Osteoarthritis, Confinement Anxiety Osteoarthritis, Confinement Anxiety 02/24/2021 02/24/2021 02/24/2021 Date Acquired: 0 0 0 Weeks of Treatment: Open Open Open Wound Status: 5x3.4x0.2 1.3x1.1x0.1 1x1.5x0.1 Measurements L x W x D (cm) 13.352 1.123 1.178 A (cm) : rea 2.67 0.112 0.118 Volume (cm) : 0.00% 0.00% 0.00% % Reduction in Area: 0.00% 0.00% 0.00% % Reduction in Volume: Full Thickness Without Exposed Full Thickness Without Exposed Full Thickness Without Exposed Classification: Support Structures Support Structures Support Structures Medium Medium Medium Exudate A mount: Serosanguineous Serosanguineous Serosanguineous Exudate Type: red, Collins red, Collins red, Collins Exudate  Color: Distinct, outline attached Distinct, outline attached Distinct, outline attached Wound Margin: Small (1-33%) Small (1-33%) None Present (0%) Granulation Amount: Red, Pink Pink N/A Granulation Quality: Large (67-100%) Large (67-100%) Large (67-100%) Necrotic Amount: Adherent Byers Necrotic Tissue: Fat Layer (Subcutaneous Tissue): Yes Fat Layer (Subcutaneous Tissue): Yes Fat Layer (Subcutaneous Tissue): Yes Exposed Structures: Fascia: No Fascia: No Fascia: No Tendon: No Tendon: No Tendon: No Muscle: No Muscle: No Muscle: No Joint: No Joint: No Joint: No Bone: No Bone: No Bone: No None None None Epithelialization: Incision and Drainage N/A N/A Procedures Performed: Treatment Notes Electronic Signature(s) Signed: 03/11/2021 3:43:46 PM By: Linton Ham MD Signed: 03/11/2021 4:57:53 PM By: Rhae Hammock RN Entered By: Linton Ham on 03/11/2021 08:59:28 -------------------------------------------------------------------------------- Multi-Disciplinary Care Plan Details Patient Name: Date of Service: Madison Corporal, [place]  [name] 03/11/2021 7:30 A M Medical Record Number: [id number] Patient Account Number: [id number] Date of Birth/Sex: Treating RN: June 17, 1950 (71 y.o. Madison Collins Primary Care Takako Minckler: Jacolyn Reedy Other Clinician: Referring Shannen Vernon: Treating Denee Boeder/Extender: Arline Asp in Treatment: [place] reviewed with physician Active Inactive Orientation to the Wound Care Program Nursing Diagnoses: Knowledge deficit related to the wound healing center program Goals: Patient/caregiver will verbalize understanding of the [facility] Date Initiated: 03/11/2021 Target Resolution Date: 03/25/2021 Goal Status: Active Interventions: Provide education on orientation to the wound center Notes: Pain, Acute or Chronic Nursing Diagnoses: Pain,  acute or chronic: actual or potential Goals: Patient will verbalize adequate pain control and receive pain control interventions during procedures as needed Date Initiated: 03/11/2021 Target Resolution Date: 03/25/2021 Goal Status: Active Interventions: Complete pain assessment as per visit requirements Notes: Wound/Skin Impairment Nursing Diagnoses: Impaired tissue integrity Goals: Patient will have a decrease in wound volume by X% from date: (specify in notes) Date Initiated: 03/11/2021 Target Resolution Date: 03/25/2021 Goal Status: Active Interventions: Assess patient/caregiver ability to obtain necessary supplies Treatment Activities: Referred to DME Maximiano Lott for dressing supplies : 03/11/2021 Skin care regimen initiated : 03/11/2021 Notes: Electronic Signature(s) Signed: 03/12/2021 5:42:24 PM By: Dellie Catholic RN Entered By: Dellie Catholic on 03/11/2021 09:05:01 -------------------------------------------------------------------------------- Pain Assessment Details Patient Name: Date of Service: [place] Corporal, [place] [name] 03/11/2021 7:30 A M Medical Record Number: [id number] Patient Account Number: [id number] Date of Birth/Sex: Treating RN: 1950/11/23 (71 y.o. Madison Collins Primary Care Eleana Tocco: Jacolyn Reedy Other Clinician: Referring Zonie Crutcher: Treating Ann-Marie Kluge/Extender: Arline Asp in Treatment: 0 Active Problems Location of Pain Severity and Description of Pain Patient Has Paino Yes Site Locations Site Locations Pain Location: Pain in Ulcers Rate the pain. Current Pain Level: 7 Worst Pain Level: 10 Least Pain Level: 0 Tolerable Pain Level: 8 Character of Pain Describe the Pain: Heavy, Sharp Pain Management and Medication Current Pain Management: Medication: No Cold Application: No Rest: No Massage: No Activity: No T.E.N.S.: No Heat Application: No Leg drop or elevation: No Is the Current Pain Management Adequate: Adequate How  does your wound impact your activities of daily livingo Sleep: No Bathing: No Appetite: No Relationship With Others: No Bladder Continence: No Emotions: No Bowel Continence: No Work: No Toileting: No Drive: No Dressing: No Hobbies: No Engineer, maintenance) Signed: 03/11/2021 12:02:35 PM By: Deon Pilling RN, BSN Entered By: Deon Pilling on 03/11/2021 08:00:16 -------------------------------------------------------------------------------- Patient/Caregiver Education Details Patient Name: Date of Service: Madison Corporal, [place] [name] 1/18/2023andnbsp7:30 A M Medical Record Number: [id number] Patient Account Number: [id number] Date of Birth/Gender: Treating RN: 05-Apr-1950 (71 y.o. Madison Collins Primary Care Physician: Jacolyn Reedy Other Clinician: Referring Physician: Treating Physician/Extender: Arline Asp in Treatment: 0 Education Assessment Education Provided To: Patient Education Topics Provided Welcome T [facility]: o Methods: Explain/Verbal Responses: Return demonstration correctly Electronic Signature(s) Signed: 03/12/2021 5:42:24 PM By: Dellie Catholic RN Signed: 03/12/2021 5:42:24 PM By: Dellie Catholic RN Entered By: Dellie Catholic on 03/11/2021 09:05:15 -------------------------------------------------------------------------------- Wound Assessment Details Patient Name: Date of Service: Madison Corporal, [place] [name] 03/11/2021 7:30 A M Medical Record Number: [id number] Patient Account Number: [id number] Date of Birth/Sex: Treating RN: 06-05-1950 (71 y.o. Madison Collins Primary Care Brooklyne Radke: Jacolyn Reedy Other Clinician: Referring Tricha Ruggirello: Treating Royale Lennartz/Extender: Arline Asp in Treatment: 0 Wound Status Wound Number: 1 Primary Abscess Etiology: Wound Location: Left, Distal Knee Wound Status: Open  Wounding Event: Trauma Comorbid Cataracts, Hypertension, Hepatitis C, Osteoarthritis, Date  Acquired: 02/24/2021 History: Confinement Anxiety Weeks Of Treatment: 0 Clustered Wound: No Photos Wound Measurements Length: (cm) 5 Width: (cm) 3.4 Depth: (cm) 0.2 Area: (cm) 13.352 Volume: (cm) 2.67 % Reduction in Area: 0% % Reduction in Volume: 0% Epithelialization: None Tunneling: No Undermining: No Wound Description Classification: Full Thickness Without Exposed Support Structures Wound Margin: Distinct, outline attached Exudate Amount: Medium Exudate Type: Serosanguineous Exudate Color: red, Collins Foul Odor After Cleansing: No Slough/Fibrino Yes Wound Bed Granulation Amount: Small (1-33%) Exposed Structure Granulation Quality: Red, Pink Fascia Exposed: No Necrotic Amount: Large (67-100%) Fat Layer (Subcutaneous Tissue) Exposed: Yes Necrotic Quality: Adherent Slough Tendon Exposed: No Muscle Exposed: No Joint Exposed: No Bone Exposed: No Treatment Notes Wound #1 (Knee) Wound Laterality: Left, Distal Cleanser Soap and Water Discharge Instruction: May shower and wash wound with dial antibacterial soap and water prior to dressing change. Peri-Wound Care Topical Primary Dressing KerraCel Ag Gelling Fiber Dressing, 4x5 in (silver alginate) Discharge Instruction: Lightly pack in the wound bed Secondary Dressing Woven Gauze Sponge, Non-Sterile 4x4 in Discharge Instruction: Apply over primary dressing as directed. ABD Pad, 8x10 Discharge Instruction: Apply over primary dressing as directed. Secured With [employer], 4.5x3.1 (in/yd) Discharge Instruction: Secure with Kerlix as directed. 71M Medipore Soft Cloth Surgical T 2x10 (in/yd) ape Discharge Instruction: Secure with tape as directed. Compression Wrap Compression Stockings Add-Ons Electronic Signature(s) Signed: 03/11/2021 12:02:35 PM By: Deon Pilling RN, BSN Signed: 03/12/2021 5:42:24 PM By: Dellie Catholic RN Entered By: Dellie Catholic on 03/11/2021  08:47:58 -------------------------------------------------------------------------------- Wound Assessment Details Patient Name: Date of Service: Madison Corporal, [place] [name] 03/11/2021 7:30 A M Medical Record Number: [id number] Patient Account Number: [id number] Date of Birth/Sex: Treating RN: [birth year] (71 y.o. Madison Collins, Madison Collins Primary Care Kinley Ferrentino: Jacolyn Reedy Other Clinician: Referring Christabel Camire: Treating Sharona Rovner/Extender: Arline Asp in Treatment: 0 Wound Status Wound Number: 2 Primary Abrasion Etiology: Wound Location: Left, Proximal Knee Wound Status: Open Wounding Event: Trauma Comorbid Cataracts, Hypertension, Hepatitis C, Osteoarthritis, Date Acquired: 02/24/2021 History: Confinement Anxiety Weeks Of Treatment: 0 Clustered Wound: No Photos Wound Measurements Length: (cm) 1.3 Width: (cm) 1.1 Depth: (cm) 0.1 Area: (cm) 1.123 Volume: (cm) 0.112 Wound Description Classification: Full Thickness Without Exposed Support Structu Wound Margin: Distinct, outline attached Exudate Amount: Medium Exudate Type: Serosanguineous Exudate Color: red, Collins res Foul Odor After Cleansing: No Slough/Fibrino Yes % Reduction in Area: 0% % Reduction in Volume: 0% Epithelialization: None Tunneling: No Undermining: No Wound Bed Granulation Amount: Small (1-33%) Exposed Structure Granulation Quality: Pink Fascia Exposed: No Necrotic Amount: Large (67-100%) Fat Layer (Subcutaneous Tissue) Exposed: Yes Necrotic Quality: Eschar, Adherent Slough Tendon Exposed: No Muscle Exposed: No Joint Exposed: No Bone Exposed: No Treatment Notes Wound #2 (Knee) Wound Laterality: Left, Proximal Cleanser Soap and Water Discharge Instruction: May shower and wash wound with dial antibacterial soap and water prior to dressing change. Peri-Wound Care Topical Primary Dressing KerraCel Ag Gelling Fiber Dressing, 4x5 in (silver alginate) Discharge Instruction: apply to wound  bed Secondary Dressing Woven Gauze Sponge, Non-Sterile 4x4 in Discharge Instruction: Apply over primary dressing as directed. ABD Pad, 8x10 Discharge Instruction: Apply over primary dressing as directed. Secured With [employer], 4.5x3.1 (in/yd) Discharge Instruction: Secure with Kerlix as directed. 71M Medipore Soft Cloth Surgical T 2x10 (in/yd) ape Discharge Instruction: Secure with tape as directed. Compression Wrap Compression Stockings Add-Ons Electronic Signature(s) Signed: 03/11/2021 12:02:35 PM By: Deon Pilling RN, BSN Signed: 03/12/2021 5:42:24 PM  By: Dellie Catholic RN Entered By: Dellie Catholic on 03/11/2021 08:15:30 -------------------------------------------------------------------------------- Wound Assessment Details Patient Name: Date of Service: [facility] [place], [place] [name] 03/11/2021 7:30 A M Medical Record Number: [id number] Patient Account Number: [id number] Date of Birth/Sex: Treating RN: 08/10/1950 (71 y.o. Madison Collins, Madison Collins Primary Care Blayke Cordrey: Jacolyn Reedy Other Clinician: Referring Quest Tavenner: Treating Scherrie Seneca/Extender: Arline Asp in Treatment: 0 Wound Status Wound Number: 3 Primary Abrasion Etiology: Etiology: Wound Location: Left, Medial Upper Leg Wound Status: Open Wounding Event: Trauma Comorbid Cataracts, Hypertension, Hepatitis C, Osteoarthritis, Date Acquired: 02/24/2021 History: Confinement Anxiety Weeks Of Treatment: 0 Clustered Wound: No Photos Wound Measurements Length: (cm) 1 Width: (cm) 1.5 Depth: (cm) 0.1 Area: (cm) 1.178 Volume: (cm) 0.118 % Reduction in Area: 0% % Reduction in Volume: 0% Epithelialization: None Tunneling: No Undermining: No Wound Description Classification: Full Thickness Without Exposed Support Structures Wound Margin: Distinct, outline attached Exudate Amount: Medium Exudate Type: Serosanguineous Exudate Color: red, Collins Foul Odor After Cleansing: No Slough/Fibrino  Yes Wound Bed Granulation Amount: None Present (0%) Exposed Structure Necrotic Amount: Large (67-100%) Fascia Exposed: No Necrotic Quality: Eschar, Adherent Slough Fat Layer (Subcutaneous Tissue) Exposed: Yes Tendon Exposed: No Muscle Exposed: No Joint Exposed: No Bone Exposed: No Treatment Notes Wound #3 (Upper Leg) Wound Laterality: Left, Medial Cleanser Soap and Water Discharge Instruction: May shower and wash wound with dial antibacterial soap and water prior to dressing change. Peri-Wound Care Topical Primary Dressing KerraCel Ag Gelling Fiber Dressing, 4x5 in (silver alginate) Discharge Instruction: apply to wound bed Secondary Dressing Woven Gauze Sponge, Non-Sterile 4x4 in Discharge Instruction: Apply over primary dressing as directed. ABD Pad, 8x10 Discharge Instruction: Apply over primary dressing as directed. Secured With [employer], 4.5x3.1 (in/yd) Discharge Instruction: Secure with Kerlix as directed. 70M Medipore Soft Cloth Surgical T 2x10 (in/yd) ape Discharge Instruction: Secure with tape as directed. Compression Wrap Compression Stockings Add-Ons Electronic Signature(s) Signed: 03/11/2021 12:02:35 PM By: Deon Pilling RN, BSN Signed: 03/12/2021 5:42:24 PM By: Dellie Catholic RN Entered By: Dellie Catholic on 03/11/2021 08:16:41 -------------------------------------------------------------------------------- Vitals Details Patient Name: Date of Service: Madison Corporal, [place] [name] 03/11/2021 7:30 A M Medical Record Number: [id number] Patient Account Number: [id number] Date of Birth/Sex: Treating RN: January 14, 1951 (71 y.o. Madison Collins, Madison Collins Primary Care Joson Sapp: Jacolyn Reedy Other Clinician: Referring Ervin Rothbauer: Treating Jak Haggar/Extender: Arline Asp in Treatment: 0 Vital Signs Time Taken: 07:54 Temperature (F): 97.9 Height (in): 60 Pulse (bpm): 84 Source: Stated Respiratory Rate (breaths/min): 16 Weight (lbs): 160 Blood  Pressure (mmHg): 135/73 Source: Stated Reference Range: 80 - 120 mg / dl Body Mass Index (BMI): 31.2 Electronic Signature(s) Signed: 03/11/2021 12:02:35 PM By: Deon Pilling RN, BSN Entered By: Deon Pilling on 03/11/2021 08:00:28

## 2021-03-12 NOTE — Progress Notes (Signed)
Madison Collins, Madison Collins ([id number]) Visit Report for 03/11/2021 Chief Complaint Document Details Patient Name: Date of Service: Madison Collins, [place] Madison P. 03/11/2021 7:30 A M Medical Record Number: [id number] Patient Account Number: [id number] Date of Birth/Sex: Treating RN: [birth year] (71 y.o. Madison Collins, Madison Collins Primary Care Provider: Jacolyn Collins Other Clinician: Referring Provider: Treating Provider/Extender: Madison Collins in Treatment: 0 Information Obtained from: Patient Chief Complaint 03/11/2021; patient is here for review of wounds around her left knee that occurred after a fall in late December Electronic Signature(s) Signed: 03/11/2021 3:43:46 PM By: Madison Collins Entered By: Madison Collins on 03/11/2021 09:00:09 -------------------------------------------------------------------------------- Debridement Details Patient Name: Date of Service: Madison Corporal, [place] Madison P. 03/11/2021 7:30 A M Medical Record Number: [id number] Patient Account Number: [id number] Date of Birth/Sex: Treating RN: Jul 09, 1950 (71 y.o. Madison Collins Primary Care Provider: Jacolyn Collins Other Clinician: Referring Provider: Treating Provider/Extender: Madison Collins in Treatment: 0 Debridement Performed for Assessment: Wound #2 Left,Proximal Knee Performed By: Physician Madison Collins Debridement Type: Debridement Level of Consciousness (Pre-procedure): Awake and Alert Pre-procedure Verification/Time Out Yes - 08:40 Taken: Start Time: 08:40 Pain Control: Lidocaine 4% T opical Solution T Area Debrided (L x W): otal 1.3 (cm) x 1.1 (cm) = 1.43 (cm) Tissue and other material debrided: Skin: Dermis , Skin: Epidermis Level: Skin/Epidermis Debridement Description: Selective/Open Wound Instrument: Curette Bleeding: Minimum Hemostasis Achieved: Pressure End Time: 08:42 Procedural Pain: 4 Post Procedural Pain: 4 Response to Treatment: Procedure was tolerated  well Level of Consciousness (Post- Awake and Alert procedure): Post Debridement Measurements of Total Wound Length: (cm) 1.3 Width: (cm) 1.1 Depth: (cm) 0.1 Volume: (cm) 0.112 Character of Wound/Ulcer Post Debridement: Improved Post Procedure Diagnosis Same as Pre-procedure Electronic Signature(s) Signed: 03/11/2021 3:43:46 PM By: Madison Collins Signed: 03/12/2021 5:42:24 PM By: Madison Catholic RN Entered By: Madison Collins on 03/11/2021 09:02:04 -------------------------------------------------------------------------------- Debridement Details Patient Name: Date of Service: Madison Corporal, [place] Madison P. 03/11/2021 7:30 A M Medical Record Number: [id number] Patient Account Number: [id number] Date of Birth/Sex: Treating RN: 1950/11/07 (71 y.o. [name], Madison Collins Primary Care Provider: Jacolyn Collins Other Clinician: Referring Provider: Treating Provider/Extender: Madison Collins in Treatment: 0 Debridement Performed for Assessment: Wound #3 Left,Medial Upper Leg Performed By: Physician Madison Collins Debridement Type: Debridement Level of Consciousness (Pre-procedure): Awake and Alert Pre-procedure Verification/Time Out Yes - 08:40 Taken: Start Time: 08:40 Pain Control: Lidocaine 4% T opical Solution T Area Debrided (L x W): otal 1 (cm) x 1.5 (cm) = 1.5 (cm) Tissue and other material debrided: Skin: Dermis , Skin: Epidermis Level: Skin/Epidermis Debridement Description: Selective/Open Wound Instrument: Curette Bleeding: Minimum Hemostasis Achieved: Pressure End Time: 08:42 Procedural Pain: 4 Post Procedural Pain: 4 Response to Treatment: Procedure was tolerated well Level of Consciousness (Post- Awake and Alert procedure): Post Debridement Measurements of Total Wound Length: (cm) 1 Width: (cm) 1.5 Depth: (cm) 0.1 Volume: (cm) 0.118 Character of Wound/Ulcer Post Debridement: Improved Post Procedure Diagnosis Same as  Pre-procedure Electronic Signature(s) Signed: 03/11/2021 3:43:46 PM By: Madison Collins Signed: 03/12/2021 5:42:24 PM By: Madison Catholic RN Entered By: Madison Collins on 03/11/2021 09:02:48 -------------------------------------------------------------------------------- HPI Details Patient Name: Date of Service: Madison Corporal, PA Madison P. 03/11/2021 7:30 A M Medical Record Number: [id number] Patient Account Number: [id number] Date of Birth/Sex: Treating RN: 1950/09/12 (71 y.o. Madison Collins Primary Care Provider: Jacolyn Collins Other Clinician: Referring Provider: Treating Provider/Extender: Madison Collins in Treatment: 0 History  of Present Illness HPI Description: ADMISSION 03/11/2021 This is a 71 year old woman who was problem began sometime after Christmas when she had a fell at work down stairs. She had an abrasion on her left knee. She was seen in the ER on 02/24/2021 and x-ray was negative. I do not believe she was given antibiotics. Sometime after this she was also seen by orthopedics perhaps on 7 January although I do not see an note here. After this she developed a rapidly increasing swelling over the left infrapatellar tendon area very painful. She also has a new wound on the left distal thigh above the patella. She is not on any antibiotics. Past medical history includes chronic pain, hypertension, depression, variably type 2 diabetes or prediabetic, hypercholesterolemia and osteoarthritis of the knees Electronic Signature(s) Signed: 03/11/2021 3:43:46 PM By: Madison Collins Entered By: Madison Collins on 03/11/2021 09:02:56 -------------------------------------------------------------------------------- Incision and Drainage Details Patient Name: Date of Service: Madison Corporal, [place] Madison P. 03/11/2021 7:30 A M Medical Record Number: [id number] Patient Account Number: [id number] Date of Birth/Sex: Treating RN: 09-08-1950 (71 y.o. Madison Collins,  Madison Collins Primary Care Provider: Jacolyn Collins Other Clinician: Referring Provider: Treating Provider/Extender: Madison Collins in Treatment: 0 Incision And Drainage Performed Wound #1 Left, Distal Knee for: Performed By: Physician Madison Collins Incision And Drainage Type: Abscess Location: Left Distal Knee Level of Consciousness (Pre- Awake and Alert procedure): Pre-procedure Verification/Time Out Yes - 08:40 Taken: Pain Control: Lidocaine 4% T opical Solution Drainage Of: Sanguineous Instrument: Blade Bleeding: Large Hemostasis Achieved: Pressure Culture Sent: Swab Procedural Pain: 4 Post Procedural Pain: 4 Response to Treatment: Procedure was tolerated well Level of Consciousness (Post- Awake and Alert procedure): Post Procedure Diagnosis Same as Pre-procedure Electronic Signature(s) Signed: 03/11/2021 3:43:46 PM By: Madison Collins Entered By: Madison Collins on 03/11/2021 08:59:36 -------------------------------------------------------------------------------- Physical Exam Details Patient Name: Date of Service: Madison Corporal, [place] Madison P. 03/11/2021 7:30 A M Medical Record Number: [id number] Patient Account Number: [id number] Date of Birth/Sex: Treating RN: March 07, 1950 (71 y.o. Madison Collins Primary Care Provider: Jacolyn Collins Other Clinician: Referring Provider: Treating Provider/Extender: Madison Collins in Treatment: 0 Constitutional Sitting or standing Blood Pressure is within target range for patient.. Pulse regular and within target range for patient.Marland Kitchen Respirations regular, non-labored and within target range.. Temperature is normal and within the target range for the patient.Marland Kitchen Appears in no distress. Cardiovascular Pedal pulses are palpable at the left popliteal posterior tibial and dorsalis pedis. Musculoskeletal There is no obvious swelling of the knee joint itself no palpable tenderness along the joint  line. Integumentary (Hair, Skin) Tender erythema around the wound on the infrapatellar tendon raised very tender. Notes Wound exam; the patient has 3 wound areas most problematically she has a large raised wound over the infrapatellar tendon. Surrounding erythema quite obviously an underlying abscess. I used a #15 scalpel to open this and we drained it of purulent drainage. This was painful. She has an area on the left distal thigh necrotic surface I used a #5 curette to remove the eschar. The wound is very superficial Strangely enough her original wound which is also over the infrapatellar tendon also had eschar that I removed this looked healed Electronic Signature(s) Signed: 03/11/2021 3:43:46 PM By: Madison Collins Entered By: Madison Collins on 03/11/2021 09:15:17 -------------------------------------------------------------------------------- Physician Orders Details Patient Name: Date of Service: Madison Corporal, [place] Madison P. 03/11/2021 7:30 A M Medical Record Number: [id number] Patient Account Number: [id number] Date of  Birth/Sex: Treating RN: [birth year] (71 y.o. Madison Collins Primary Care Provider: Jacolyn Collins Other Clinician: Referring Provider: Treating Provider/Extender: Madison Collins in Treatment: 0 Verbal / Phone Orders: No Diagnosis Coding Follow-up Appointments ppointment in 1 week. - Dr Dellia Nims Return A *** Pick up medication from Pharmacy*****Nausea and vomiting and fever-go to the ED Other: - [facility] Bathing/ Shower/ Hygiene May shower and wash wound with soap and water. Edema Control - Lymphedema / SCD / Other Left Lower Extremity Elevate legs to the level of the heart or above for 30 minutes daily and/or when sitting, a frequency of: - throughout the day Wound Treatment Wound #1 - Knee Wound Laterality: Left, Distal Cleanser: Soap and Water 1 x Per Day/30 Days Discharge Instructions: May shower and wash wound with dial antibacterial soap  and water prior to dressing change. Prim Dressing: KerraCel Ag Gelling Fiber Dressing, 4x5 in (silver alginate) ary 1 x Per Day/30 Days Discharge Instructions: Lightly pack in the wound bed Secondary Dressing: Woven Gauze Sponge, Non-Sterile 4x4 in 1 x Per Day/30 Days Discharge Instructions: Apply over primary dressing as directed. Secondary Dressing: ABD Pad, 8x10 1 x Per Day/30 Days Discharge Instructions: Apply over primary dressing as directed. Secured With: [employer], 4.5x3.1 (in/yd) 1 x Per Day/30 Days Discharge Instructions: Secure with Kerlix as directed. Secured With: 44M [employer] Public affairs consultant Surgical T 2x10 (in/yd) 1 x Per Day/30 Days ape Discharge Instructions: Secure with tape as directed. Wound #2 - Knee Wound Laterality: Left, Proximal Cleanser: Soap and Water 1 x Per Day/30 Days Discharge Instructions: May shower and wash wound with dial antibacterial soap and water prior to dressing change. Prim Dressing: KerraCel Ag Gelling Fiber Dressing, 4x5 in (silver alginate) ary 1 x Per Day/30 Days Discharge Instructions: apply to wound bed Secondary Dressing: Woven Gauze Sponge, Non-Sterile 4x4 in 1 x Per Day/30 Days Discharge Instructions: Apply over primary dressing as directed. Secondary Dressing: ABD Pad, 8x10 1 x Per Day/30 Days Discharge Instructions: Apply over primary dressing as directed. Secured With: [employer], 4.5x3.1 (in/yd) 1 x Per Day/30 Days Discharge Instructions: Secure with Kerlix as directed. Secured With: 44M [employer] Public affairs consultant Surgical T 2x10 (in/yd) 1 x Per Day/30 Days ape Discharge Instructions: Secure with tape as directed. Wound #3 - Upper Leg Wound Laterality: Left, Medial Cleanser: Soap and Water 1 x Per Day/30 Days Discharge Instructions: May shower and wash wound with dial antibacterial soap and water prior to dressing change. Prim Dressing: KerraCel Ag Gelling Fiber Dressing, 4x5 in (silver alginate) ary 1 x Per Day/30  Days Discharge Instructions: apply to wound bed Secondary Dressing: Woven Gauze Sponge, Non-Sterile 4x4 in 1 x Per Day/30 Days Discharge Instructions: Apply over primary dressing as directed. Secondary Dressing: ABD Pad, 8x10 1 x Per Day/30 Days Discharge Instructions: Apply over primary dressing as directed. Secured With: [employer], 4.5x3.1 (in/yd) 1 x Per Day/30 Days Discharge Instructions: Secure with Kerlix as directed. Secured With: 44M [employer] Public affairs consultant Surgical T 2x10 (in/yd) 1 x Per Day/30 Days ape Discharge Instructions: Secure with tape as directed. Laboratory naerobe culture (MICRO) - Culture of Left distal knee Bacteria identified in Unspecified specimen by A LOINC Code: 314-9 Convenience Name: Anerobic culture Patient Medications llergies: adhesive tape, latex A Notifications Medication Indication Start End 03/11/2021 doxycycline monohydrate DOSE oral 100 mg capsule - capsule oral. T 1 capsule twice a day for 7 days [employer]) Signed: 03/11/2021 3:43:46 PM By: Madison Collins Signed: 03/12/2021  5:42:24 PM By: Madison Catholic RN Entered By: Madison Collins on 03/11/2021 09:37:20 Prescription 03/11/2021 -------------------------------------------------------------------------------- Thalia Bloodgood Collins Patient Name: Provider: [birth year] [id number] Date of Birth: NPI#Wanda Plump [id number] Sex: DEA #: [phone] Phone #: License #: [place] Patient Address: [address] [place], [place] [address] [place], [place] [address] [phone] Allergies adhesive tape; latex Medication Medication: Route: Strength: Form: doxycycline monohydrate oral 100 mg capsule Class: TETRACYCLINE ANTIBIOTICS Dose: Frequency / Time: Indication: capsule oral. T 1 capsule twice a day for 7 days ake Number of Refills: Number of Units: 0 Generic  Substitution: Start Date: End Date: Administered at Facility: Substitution Permitted 2/99/2426 No Note to Pharmacy: Hand Signature: Date(s): Electronic Signature(s) Signed: 03/11/2021 3:43:46 PM By: Madison Collins Signed: 03/12/2021 5:42:24 PM By: Madison Catholic RN Entered By: Madison Collins on 03/11/2021 09:37:21 -------------------------------------------------------------------------------- Problem List Details Patient Name: Date of Service: Madison Corporal, [place] Madison P. 03/11/2021 7:30 A M Medical Record Number: [id number] Patient Account Number: [id number] Date of Birth/Sex: Treating RN: Mar 16, 1950 (71 y.o. 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 03/11/2021 7:30 A M Medical Record Number: [id number] Patient Account Number: [id number] Date of Birth/Sex: Treating RN: [birth year] (71 y.o. Madison Collins, Madison Collins Primary Care Provider: Jacolyn Collins Other Clinician: Referring Provider: Treating Provider/Extender: Madison Collins in Treatment: 0 Subjective Chief Complaint Information obtained from Patient 03/11/2021; patient is here for review of wounds around her left  knee that occurred after a fall in late December History of Present Illness (HPI) ADMISSION 03/11/2021 This is a 71 year old woman who was problem began sometime after Christmas when she had a fell at work down stairs. She had an abrasion on her left knee. She was seen in the ER on 02/24/2021 and x-ray was negative. I do not believe she was given antibiotics. Sometime after this she was also seen by orthopedics perhaps on 7 January although I do not see an note here. After this she developed a rapidly increasing swelling over the left infrapatellar tendon area very painful. She also has a new wound on the left distal thigh above the patella. She is not on any antibiotics. Past medical history includes chronic pain, hypertension, depression, variably type 2 diabetes or prediabetic, hypercholesterolemia and osteoarthritis of the knees Patient History Information obtained from Patient. Allergies adhesive tape (Reaction: rash), latex (Reaction: rash) Family History Cancer - Mother,Father, Diabetes - Father,Mother, Heart Disease - Father, Hypertension - Father,Mother, Lung Disease - Mother, Stroke - Father, Tuberculosis - Maternal Grandparents, No family history of Hereditary Spherocytosis, Kidney Disease, Seizures, Thyroid Problems. Social History Never smoker, Marital Status - Widowed, Alcohol Use - Never, Drug Use - No History, Caffeine Use - Never. Medical History Eyes Patient has history of Cataracts - removed bilateral Denies history of Glaucoma, Optic Neuritis Ear/Nose/Mouth/Throat Denies history of Chronic  sinus problems/congestion, Middle ear problems Hematologic/Lymphatic Denies history of Anemia, Hemophilia, Human Immunodeficiency Virus, Sickle Cell Disease Respiratory Denies history of Aspiration, Asthma, Chronic Obstructive Pulmonary Disease (COPD), Pneumothorax, Sleep Apnea, Tuberculosis Cardiovascular Patient has history of Hypertension Denies history of Angina, Arrhythmia,  Congestive Heart Failure, Coronary Artery Disease, Deep Vein Thrombosis, Hypotension, Myocardial Infarction, Peripheral Arterial Disease, Peripheral Venous Disease, Phlebitis, Vasculitis Gastrointestinal Patient has history of Hepatitis C - completed treatment Denies history of Cirrhosis , Colitis, Crohnoos, Hepatitis A, Hepatitis B Endocrine Denies history of Type I Diabetes, Type II Diabetes Genitourinary Denies history of End Stage Renal Disease Immunological Denies history of Lupus Erythematosus, Raynaudoos, Scleroderma Integumentary (Skin) Denies history of History of Burn Musculoskeletal Patient has history of Osteoarthritis Denies history of Gout, Rheumatoid Arthritis, Osteomyelitis Neurologic Denies history of Dementia, Neuropathy, Quadriplegia, Paraplegia, Seizure Disorder Oncologic Denies history of Received Chemotherapy, Received Radiation Psychiatric Patient has history of Confinement Anxiety Denies history of Anorexia/bulimia Hospitalization/Surgery History - x6 surgeries on left knee. Medical A Surgical History Notes nd Cardiovascular hyperlipidemia Endocrine Prediabetic Review of Systems (ROS) Constitutional Symptoms (General Health) Denies complaints or symptoms of Fatigue, Fever, Chills, Marked Weight Change. Eyes Complains or has symptoms of Glasses / Contacts - reading. Denies complaints or symptoms of Dry Eyes, Vision Changes. Ear/Nose/Mouth/Throat Denies complaints or symptoms of Chronic sinus problems or rhinitis. Respiratory Denies complaints or symptoms of Chronic or frequent coughs, Shortness of Breath. Cardiovascular Denies complaints or symptoms of Chest pain. Gastrointestinal Denies complaints or symptoms of Frequent diarrhea, Nausea, Vomiting. Endocrine Denies complaints or symptoms of Heat/cold intolerance. Genitourinary Denies complaints or symptoms of Frequent urination. Integumentary (Skin) Complains or has symptoms of Wounds - left  knee. Musculoskeletal Denies complaints or symptoms of Muscle Pain, Muscle Weakness. Neurologic Denies complaints or symptoms of Numbness/parasthesias. Psychiatric Complains or has symptoms of Claustrophobia. Denies complaints or symptoms of Suicidal. Objective Constitutional Sitting or standing Blood Pressure is within target range for patient.. Pulse regular and within target range for patient.Marland Kitchen Respirations regular, non-labored and within target range.. Temperature is normal and within the target range for the patient.Marland Kitchen Appears in no distress. Vitals Time Taken: 7:54 AM, Height: 60 in, Source: Stated, Weight: 160 lbs, Source: Stated, BMI: 31.2, Temperature: 97.9 F, Pulse: 84 bpm, Respiratory Rate: 16 breaths/min, Blood Pressure: 135/73 mmHg. Cardiovascular Pedal pulses are palpable at the left popliteal posterior tibial and dorsalis pedis. General Notes: Wound exam; the patient has 3 wound areas most problematically she has a large raised wound over the infrapatellar tendon. Surrounding erythema quite obviously an underlying abscess. I used a #15 scalpel to open this and we drained it of purulent drainage. This was painful. oo She has an area on the left distal thigh necrotic surface I used a #5 curette to remove the eschar. The wound is very superficial oo Strangely enough her original wound which is also over the infrapatellar tendon also had eschar that I removed this looked healed Integumentary (Hair, Skin) Wound #1 status is Open. Original cause of wound was Trauma. The date acquired was: 02/24/2021. The wound is located on the Left,Distal Knee. The wound measures 5cm length x 3.4cm width x 0.2cm depth; 13.352cm^2 area and 2.67cm^3 volume. There is Fat Layer (Subcutaneous Tissue) exposed. There is no tunneling or undermining noted. There is a medium amount of serosanguineous drainage noted. The wound margin is distinct with the outline attached to the wound base. There is small  (1-33%) red, pink granulation within the wound bed. There is a large (67-100%) amount of necrotic  tissue within the wound bed including Adherent Slough. Wound #2 status is Open. Original cause of wound was Trauma. The date acquired was: 02/24/2021. The wound is located on the Left,Proximal Knee. The wound measures 1.3cm length x 1.1cm width x 0.1cm depth; 1.123cm^2 area and 0.112cm^3 volume. There is Fat Layer (Subcutaneous Tissue) exposed. There is no tunneling or undermining noted. There is a medium amount of serosanguineous drainage noted. The wound margin is distinct with the outline attached to the wound base. There is small (1-33%) pink granulation within the wound bed. There is a large (67-100%) amount of necrotic tissue within the wound bed including Eschar and Adherent Slough. Wound #3 status is Open. Original cause of wound was Trauma. The date acquired was: 02/24/2021. The wound is located on the Left,Medial Upper Leg. The wound measures 1cm length x 1.5cm width x 0.1cm depth; 1.178cm^2 area and 0.118cm^3 volume. There is Fat Layer (Subcutaneous Tissue) exposed. There is no tunneling or undermining noted. There is a medium amount of serosanguineous drainage noted. The wound margin is distinct with the outline attached to the wound base. There is no granulation within the wound bed. There is a large (67-100%) amount of necrotic tissue within the wound bed including Eschar and Adherent Slough. 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 There was a Selective/Open Wound Skin/Epidermis Debridement with a total area of 1.43 sq cm performed by Madison Collins. With the following instrument(s):  Curette Material removed includes Skin: Dermis and Skin: Epidermis and after achieving pain control using Lidocaine 4% Topical Solution. No specimens were taken. A time out was conducted at 08:40, prior to the start of the procedure. A Minimum amount of bleeding was controlled with Pressure. The procedure was tolerated well with a pain level of 4 throughout and a pain level of 4 following the procedure. Post Debridement Measurements: 1.3cm length x 1.1cm width x 0.1cm depth; 0.112cm^3 volume. Character of Wound/Ulcer Post Debridement is improved. Post procedure Diagnosis Wound #2: Same as Pre-Procedure Wound #3 Pre-procedure diagnosis of Wound #3 is an Abrasion located on the Left,Medial Upper Leg . There was a Selective/Open Wound Skin/Epidermis Debridement with a total area of 1.5 sq cm performed by Madison Collins. With the following instrument(s): Curette Material removed includes Skin: Dermis and Skin: Epidermis and after achieving pain control using Lidocaine 4% Topical Solution. No specimens were taken. A time out was conducted at 08:40, prior to the start of the procedure. A Minimum amount of bleeding was controlled with Pressure. The procedure was tolerated well with a pain level of 4 throughout and a pain level of 4 following the procedure. Post Debridement Measurements: 1cm length x 1.5cm width x 0.1cm depth; 0.118cm^3 volume. Character of Wound/Ulcer Post Debridement is improved. Post procedure Diagnosis Wound #3: Same as Pre-Procedure Wound #1 Pre-procedure diagnosis of Wound #1 is an Abscess located on the Left, Distal Knee . Abscess incision and drainage was provided by Madison Collins. The skin was cleansed and prepped with anti-septic followed by pain control using Lidocaine 4% T opical Solution. An incision was made in the Left Distal Knee with the following instrument(s): Blade. There was an immediate release of Sanguineous fluid. A Large amount of bleeding was  controlled with Pressure. A time out was conducted at 08:40, prior to the start of the procedure. Swab culture was sent. The procedure was tolerated well with a pain level of 4 throughout and a pain level of 4 following the procedure. Post procedure Diagnosis Wound #1: Same as Pre-Procedure Plan Follow-up Appointments: Return Appointment in 1 week. - Dr Dellia Nims *** Pick up medication from Pharmacy*****Nausea and vomiting and fever-go to the ED Other: - [facility] Bathing/ Shower/ Hygiene: May shower and wash wound with soap and water. Edema Control - Lymphedema / SCD / Other: Elevate legs to the level of the heart or above for 30 minutes daily and/or when sitting, a frequency of: - throughout the day Laboratory ordered were: Anerobic culture - Culture of Left distal knee WOUND #1: - Knee Wound Laterality: Left, Distal Cleanser: Soap and Water 1 x Per Day/30 Days Discharge Instructions: May shower and wash wound with dial antibacterial soap and water prior to dressing change. Prim Dressing: KerraCel Ag Gelling Fiber Dressing, 4x5 in (silver alginate) 1 x Per Day/30 Days ary Discharge Instructions: Lightly pack in the wound bed Secondary Dressing: Woven Gauze Sponge, Non-Sterile 4x4 in 1 x Per Day/30 Days Discharge Instructions: Apply over primary dressing as directed. Secondary Dressing: ABD Pad, 8x10 1 x Per Day/30 Days Discharge Instructions: Apply over primary dressing as directed. Secured With: [employer], 4.5x3.1 (in/yd) 1 x Per Day/30 Days Discharge Instructions: Secure with Kerlix as directed. Secured With: 65M [employer] Public affairs consultant Surgical T 2x10 (in/yd) 1 x Per Day/30 Days ape Discharge Instructions: Secure with tape as directed. WOUND #2: - Knee Wound Laterality: Left, Proximal Cleanser: Soap and Water 1 x Per Day/30 Days Discharge Instructions: May shower and wash wound with dial antibacterial soap and water prior to dressing change. Prim Dressing: KerraCel Ag Gelling  Fiber Dressing, 4x5 in (silver alginate) 1 x Per Day/30 Days ary Discharge Instructions: apply to wound bed Secondary Dressing: Woven Gauze Sponge, Non-Sterile 4x4 in 1 x Per Day/30 Days Discharge Instructions: Apply over primary dressing as directed. Secondary Dressing: ABD Pad, 8x10 1 x Per Day/30 Days Discharge Instructions: Apply over primary dressing as directed. Secured With: [employer], 4.5x3.1 (in/yd) 1 x Per Day/30 Days Discharge Instructions: Secure with Kerlix as directed. Secured With: 65M [employer] Public affairs consultant Surgical T 2x10 (in/yd) 1 x Per Day/30 Days ape Discharge Instructions: Secure with tape as directed. WOUND #3: - Upper Leg Wound Laterality: Left, Medial Cleanser: Soap and Water 1 x Per Day/30 Days Discharge Instructions: May shower and wash wound with dial antibacterial soap and water prior to dressing change. Prim Dressing: KerraCel Ag Gelling Fiber Dressing, 4x5 in (silver alginate) 1 x Per Day/30 Days ary Discharge Instructions: apply to wound bed Secondary Dressing: Woven Gauze Sponge, Non-Sterile 4x4 in 1 x Per Day/30 Days Discharge Instructions: Apply over primary dressing as directed. Secondary Dressing: ABD Pad, 8x10 1 x Per Day/30 Days Discharge Instructions: Apply over primary dressing as directed. Secured With: [employer], 4.5x3.1 (in/yd) 1 x Per Day/30 Days Discharge Instructions: Secure with Kerlix as directed. Secured With: 65M [employer] Public affairs consultant Surgical T 2x10 (in/yd) 1 x Per Day/30 Days ape Discharge Instructions: Secure with tape as directed. 1. Empiric doxycycline while we wait for culture of the abscess 2. Silver alginate as the primary dressing she will clean the wounds and then dress this daily 3. I warned her that the area will drain in fact it needs to drain. 4. I marked the area of  erythema around the abscessed wound and told her to seek urgent medical attention if this expands streaks up her leg etc. or she develops  systemic symptoms 5. Interestingly her original wound is closed I am not exactly sure how the 2 wounds opened up afterwards unless there was unrecognized areas that became infected. 6. I do not believe that there is any involvement of the joint itself there is no effusion no tenderness and no swelling. Nothing to suggest a septic arthritis Electronic Signature(s) Signed: 03/11/2021 3:43:46 PM By: Madison Collins Entered By: Madison Collins on 03/11/2021 09:11:56 -------------------------------------------------------------------------------- HxROS Details Patient Name: Date of Service: Madison Corporal, PA Madison P. 03/11/2021 7:30 A M Medical Record Number: [id number] Patient Account Number: [id number] Date of Birth/Sex: Treating RN: [birth year] (71 y.o. Debby Bud Primary Care Provider: Jacolyn Collins Other Clinician: Referring Provider: Treating Provider/Extender: Madison Collins in Treatment: 0 Information Obtained From Patient Constitutional Symptoms (General Health) Complaints and Symptoms: Negative for: Fatigue; Fever; Chills; Marked Weight Change Eyes Complaints and Symptoms: Positive for: Glasses / Contacts - reading Negative for: Dry Eyes; Vision Changes Medical History: Positive for: Cataracts - removed bilateral Negative for: Glaucoma; Optic Neuritis Ear/Nose/Mouth/Throat Complaints and Symptoms: Negative for: Chronic sinus problems or rhinitis Medical History: Negative for: Chronic sinus problems/congestion; Middle ear problems Respiratory Complaints and Symptoms: Negative for: Chronic or frequent coughs; Shortness of Breath Medical History: Negative for: Aspiration; Asthma; Chronic Obstructive Pulmonary Disease (COPD); Pneumothorax; Sleep Apnea; Tuberculosis Cardiovascular Complaints and Symptoms: Negative for: Chest pain Medical History: Positive for: Hypertension Negative for: Angina; Arrhythmia; Congestive Heart Failure; Coronary Artery  Disease; Deep Vein Thrombosis; Hypotension; Myocardial Infarction; Peripheral Arterial Disease; Peripheral Venous Disease; Phlebitis; Vasculitis Past Medical History Notes: hyperlipidemia Gastrointestinal Complaints and Symptoms: Negative for: Frequent diarrhea; Nausea; Vomiting Medical History: Positive for: Hepatitis C - completed treatment Negative for: Cirrhosis ; Colitis; Crohns; Hepatitis A; Hepatitis B Endocrine Complaints and Symptoms: Negative for: Heat/cold intolerance Medical History: Negative for: Type I Diabetes; Type II Diabetes Past Medical History Notes: Prediabetic Genitourinary Complaints and Symptoms: Negative for: Frequent urination Medical History: Negative for: End Stage Renal Disease Integumentary (Skin) Complaints and Symptoms: Positive for: Wounds - left knee Medical History: Negative for: History of Burn Musculoskeletal Complaints and Symptoms: Negative for: Muscle Pain; Muscle Weakness Medical History: Positive for: Osteoarthritis Negative for: Gout; Rheumatoid Arthritis; Osteomyelitis Neurologic Complaints and Symptoms: Negative for: Numbness/parasthesias Medical History: Negative for: Dementia; Neuropathy; Quadriplegia; Paraplegia; Seizure Disorder Psychiatric Complaints and Symptoms: Positive for: Claustrophobia Negative for: Suicidal Medical History: Positive for: Confinement Anxiety Negative for: Anorexia/bulimia Hematologic/Lymphatic Medical History: Negative for: Anemia; Hemophilia; Human Immunodeficiency Virus; Sickle Cell Disease Immunological Medical History: Negative for: Lupus Erythematosus; Raynauds; Scleroderma Oncologic Medical History: Negative for: Received Chemotherapy; Received Radiation HBO Extended History Items Eyes: Cataracts Immunizations Pneumococcal Vaccine: Received Pneumococcal Vaccination: Yes Received Pneumococcal Vaccination On or After 60th Birthday: Yes Implantable Devices No devices  added Hospitalization / Surgery History Type of Hospitalization/Surgery x6 surgeries on left knee Family and Social History Cancer: Yes - Mother,Father; Diabetes: Yes - Father,Mother; Heart Disease: Yes - Father; Hereditary Spherocytosis: No; Hypertension: Yes - Father,Mother; Kidney Disease: No; Lung Disease: Yes - Mother; Seizures: No; Stroke: Yes - Father; Thyroid Problems: No; Tuberculosis: Yes - Maternal Grandparents; Never smoker; Marital Status - Widowed; Alcohol Use: Never; Drug Use: No History; Caffeine Use: Never; Financial Concerns: No; Food, Clothing or Shelter Needs: Yes - per patient currently living in a hotel; Support System Lacking: No; Transportation Concerns: No Engineer, maintenance) Signed:  03/11/2021 12:02:35 PM By: Deon Pilling RN, BSN Signed: 03/11/2021 3:43:46 PM By: Madison Collins Entered By: Deon Pilling on 03/11/2021 08:07:12 -------------------------------------------------------------------------------- SuperBill Details Patient Name: Date of Service: Madison Corporal, PA Madison P. 03/11/2021 Medical Record Number: [id number] Patient Account Number: [id number] Date of Birth/Sex: Treating RN: [birth year] (71 y.o. 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 Facility Procedures CPT4 Code: [id number] Description: 33832 - WOUND CARE VISIT-LEV 3 EST PT Modifier: Quantity: 1 CPT4 Code: [id number] Description: 00459 - DEBRIDE WOUND 1ST 20 SQ CM OR < ICD-10 Diagnosis Description L97.828 Non-pressure chronic ulcer of other part of left lower leg with other specified  se Modifier: verity Quantity: 1 Physician Procedures : CPT4 Code Description Modifier [id number] - WC PHYS DEBR WO ANESTH 20 SQ CM ICD-10 Diagnosis Description L97.828 Non-pressure chronic ulcer of other part of left lower leg with other specified severity Quantity: 1 : [id number] - I and D Abscess; simple/single ICD-10 Diagnosis Description O37.290 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

## 2021-03-16 LAB — AEROBIC/ANAEROBIC CULTURE W GRAM STAIN (SURGICAL/DEEP WOUND)

## 2021-03-17 ENCOUNTER — Encounter (HOSPITAL_BASED_OUTPATIENT_CLINIC_OR_DEPARTMENT_OTHER): Payer: PPO | Admitting: Internal Medicine

## 2021-03-18 ENCOUNTER — Other Ambulatory Visit: Payer: Self-pay

## 2021-03-18 ENCOUNTER — Encounter (HOSPITAL_BASED_OUTPATIENT_CLINIC_OR_DEPARTMENT_OTHER): Payer: PPO | Admitting: Internal Medicine

## 2021-03-18 DIAGNOSIS — L97821 Non-pressure chronic ulcer of other part of left lower leg limited to breakdown of skin: Secondary | ICD-10-CM | POA: Diagnosis not present

## 2021-03-18 DIAGNOSIS — L97822 Non-pressure chronic ulcer of other part of left lower leg with fat layer exposed: Secondary | ICD-10-CM | POA: Diagnosis not present

## 2021-03-19 NOTE — Progress Notes (Signed)
SHAKISHA, Collins ([id number]) Visit Report for 03/18/2021 Arrival Information Details Patient Name: Date of Service: Madison Collins, [place] [name] 03/18/2021 1:00 PM Medical Record Number: [id number] Patient Account Number: [id number] Date of Birth/Sex: Treating RN: [birth year] (71 y.o. Madison Collins Primary Care Siler Mavis: Jacolyn Reedy Other Clinician: Referring Ephraim Reichel: Treating Jamilya Sarrazin/Extender: Arline Asp in Treatment: 1 Visit Information History Since Last Visit Added or deleted any medications: No Patient Arrived: Ambulatory Any new allergies or adverse reactions: No Arrival Time: 13:00 Had a fall or experienced change in No Accompanied By: alone activities of daily living that may affect Transfer Assistance: None risk of falls: Patient Identification Verified: Yes Signs or symptoms of abuse/neglect since last visito No Secondary Verification Process Completed: Yes Hospitalized since last visit: No Patient Requires Transmission-Based Precautions: No Implantable device outside of the clinic excluding No Patient Has Alerts: No cellular tissue based products placed in the center since last visit: Has Dressing in Place as Prescribed: Yes Pain Present Now: No Electronic Signature(s) Signed: 03/19/2021 6:13:38 PM By: Levan Hurst RN, BSN Entered By: Levan Hurst on 03/18/2021 13:00:44 -------------------------------------------------------------------------------- Encounter Discharge Information Details Patient Name: Date of Service: Madison Corporal, [place] [name] 03/18/2021 1:00 PM Medical Record Number: [id number] Patient Account Number: [id number] Date of Birth/Sex: Treating RN: May 12, 1950 (71 y.o. Madison Collins Primary Care Casidy Alberta: Jacolyn Reedy Other Clinician: Referring Lindwood Mogel: Treating Jenavi Beedle/Extender: Arline Asp in Treatment: 1 Encounter Discharge Information Items Post Procedure Vitals Discharge Condition:  Stable Temperature (F): 97.9 Ambulatory Status: Ambulatory Pulse (bpm): 103 Discharge Destination: Home Respiratory Rate (breaths/min): 16 Transportation: Private Auto Blood Pressure (mmHg): 172/71 Accompanied By: alone Schedule Follow-up Appointment: Yes Clinical Summary of Care: Patient Declined Electronic Signature(s) Signed: 03/19/2021 6:13:38 PM By: Levan Hurst RN, BSN Entered By: Levan Hurst on 03/19/2021 13:59:20 -------------------------------------------------------------------------------- Multi Wound Chart Details Patient Name: Date of Service: Madison Corporal, [place] [name] 03/18/2021 1:00 PM Medical Record Number: [id number] Patient Account Number: [id number] Date of Birth/Sex: Treating RN: 1950-02-27 (71 y.o. Madison Collins, [name] Primary Care Daric Koren: Jacolyn Reedy Other Clinician: Referring Kaytlynne Neace: Treating Darnita Woodrum/Extender: Arline Asp in Treatment: 1 Vital Signs Height(in): 60 Pulse(bpm): 103 Weight(lbs): 160 Blood Pressure(mmHg): 172/71 Body Mass Index(BMI): 31.2 Temperature(F): 97.9 Respiratory Rate(breaths/min): 16 Photos: Left, Distal Knee Left, Proximal Knee Left, Medial Upper Leg Wound Location: Trauma Trauma Trauma Wounding Event: Abscess Abrasion Abrasion Primary Etiology: Cataracts, Hypertension, Hepatitis C, Cataracts, Hypertension, Hepatitis C, Cataracts, Hypertension, Hepatitis C, Comorbid History: Osteoarthritis, Confinement Anxiety Osteoarthritis, Confinement Anxiety Osteoarthritis, Confinement Anxiety 02/24/2021 02/24/2021 02/24/2021 Date Acquired: 1 1 1  Weeks of Treatment: Open Open Open Wound Status: No No No Wound Recurrence: 3.2x2.8x0.7 0x0x0 0x0x0 Measurements L x W x D (cm) 7.037 0 0 A (cm) : rea 4.926 0 0 Volume (cm) : 47.30% 100.00% 100.00% % Reduction in A rea: -84.50% 100.00% 100.00% % Reduction in Volume: 4 Starting Position 1 (o'clock): 6 Ending Position 1 (o'clock): 0.9 Maximum  Distance 1 (cm): Yes No No Undermining: Full Thickness Without Exposed Full Thickness Without Exposed Full Thickness Without Exposed Classification: Support Structures Support Structures Support Structures Medium None Present None Present Exudate A mount: Serosanguineous N/A N/A Exudate Type: red, brown N/A N/A Exudate Color: Distinct, outline attached Distinct, outline attached Distinct, outline attached Wound Margin: Small (1-33%) None Present (0%) None Present (0%) Granulation A mount: Red, Pink N/A N/A Granulation Quality: Large (67-100%) None Present (0%) None Present (0%) Necrotic A mount: Eschar, Adherent Slough N/A N/A Necrotic Tissue:  Fat Layer (Subcutaneous Tissue): Yes Fascia: No Fascia: No Exposed Structures: Fascia: No Fat Layer (Subcutaneous Tissue): No Fat Layer (Subcutaneous Tissue): No Tendon: No Tendon: No Tendon: No Muscle: No Muscle: No Muscle: No Joint: No Joint: No Joint: No Bone: No Bone: No Bone: No Small (1-33%) Large (67-100%) Large (67-100%) Epithelialization: Debridement - Excisional N/A N/A Debridement: Pre-procedure Verification/Time Out 13:19 N/A N/A Taken: Subcutaneous, Slough N/A N/A Tissue Debrided: Skin/Subcutaneous Tissue N/A N/A Level: 8.96 N/A N/A Debridement A (sq cm): rea Curette N/A N/A Instrument: Minimum N/A N/A Bleeding: Pressure N/A N/A Hemostasis A chieved: 4 N/A N/A Procedural Pain: 2 N/A N/A Post Procedural Pain: Procedure was tolerated well N/A N/A Debridement Treatment Response: Post Debridement Measurements L x 3.2x2.8x0.7 N/A N/A W x D (cm) 4.926 N/A N/A Post Debridement Volume: (cm) Debridement N/A N/A Procedures Performed: Treatment Notes Electronic Signature(s) Signed: 03/18/2021 4:28:28 PM By: Linton Ham MD Signed: 03/19/2021 5:37:29 PM By: Rhae Hammock RN Entered By: Linton Ham on 03/18/2021  13:31:22 -------------------------------------------------------------------------------- Multi-Disciplinary Care Plan Details Patient Name: Date of Service: [facility], [place] [name] 03/18/2021 1:00 PM Medical Record Number: [id number] Patient Account Number: [id number] Date of Birth/Sex: Treating RN: [birth year] (71 y.o. Madison Collins Primary Care Kohana Amble: Jacolyn Reedy Other Clinician: Referring Yola Paradiso: Treating Tamar Miano/Extender: Arline Asp in Treatment: 1 Multidisciplinary Care Plan reviewed with physician Active Inactive Wound/Skin Impairment Nursing Diagnoses: Impaired tissue integrity Goals: Patient/caregiver will verbalize understanding of skin care regimen Date Initiated: 03/18/2021 Target Resolution Date: 04/10/2021 Goal Status: Active Ulcer/skin breakdown will have a volume reduction of 30% by week 4 Date Initiated: 03/18/2021 Target Resolution Date: 04/10/2021 Goal Status: Active Interventions: Assess patient/caregiver ability to obtain necessary supplies Assess patient/caregiver ability to perform ulcer/skin care regimen upon admission and as needed Assess ulceration(s) every visit Provide education on ulcer and skin care Treatment Activities: Referred to DME Tomia Enlow for dressing supplies : 03/11/2021 Skin care regimen initiated : 03/11/2021 Notes: Electronic Signature(s) Signed: 03/19/2021 6:13:38 PM By: Levan Hurst RN, BSN Entered By: Levan Hurst on 03/18/2021 13:24:06 -------------------------------------------------------------------------------- Pain Assessment Details Patient Name: Date of Service: Madison Corporal, [place] [name] 03/18/2021 1:00 PM Medical Record Number: [id number] Patient Account Number: [id number] Date of Birth/Sex: Treating RN: Jan 20, 1951 (70 y.o. Madison Collins Primary Care Tarae Wooden: Jacolyn Reedy Other Clinician: Referring Zebastian Carico: Treating Sharniece Gibbon/Extender: Arline Asp in Treatment:  1 Active Problems Location of Pain Severity and Description of Pain Patient Has Paino No Site Locations Pain Management and Medication Current Pain Management: Electronic Signature(s) Signed: 03/19/2021 6:13:38 PM By: Levan Hurst RN, BSN Entered By: Levan Hurst on 03/18/2021 13:01:34 -------------------------------------------------------------------------------- Patient/Caregiver Education Details Patient Name: Date of Service: Madison Corporal, [place] [name] 1/25/2023andnbsp1:00 PM Medical Record Number: [id number] Patient Account Number: [id number] Date of Birth/Gender: Treating RN: May 14, 1950 (71 y.o. Madison Collins Primary Care Physician: Jacolyn Reedy Other Clinician: Referring Physician: Treating Physician/Extender: Arline Asp in Treatment: 1 Education Assessment Education Provided To: Patient Education Topics Provided Wound/Skin Impairment: Methods: Explain/Verbal Responses: State content correctly Electronic Signature(s) Signed: 03/19/2021 6:13:38 PM By: Levan Hurst RN, BSN Entered By: Levan Hurst on 03/18/2021 13:24:32 -------------------------------------------------------------------------------- Wound Assessment Details Patient Name: Date of Service: Madison Corporal, [place] [name] 03/18/2021 1:00 PM Medical Record Number: [id number] Patient Account Number: [id number] Date of Birth/Sex: Treating RN: [birth year] (71 y.o. Madison Collins Primary Care Jalyah Weinheimer: Jacolyn Reedy Other Clinician: Referring Beola Vasallo: Treating Jessamy Torosyan/Extender: Arline Asp in Treatment: 1 Wound Status Wound  Number: 1 Primary Abscess Etiology: Wound Location: Left, Distal Knee Wound Status: Open Wounding Event: Trauma Comorbid Cataracts, Hypertension, Hepatitis C, Osteoarthritis, Date Acquired: 02/24/2021 History: Confinement Anxiety Weeks Of Treatment: 1 Clustered Wound: No Photos Wound Measurements Length: (cm) 3.2 Width: (cm)  2.8 Depth: (cm) 0.7 Area: (cm) 7.037 Volume: (cm) 4.926 % Reduction in Area: 47.3% % Reduction in Volume: -84.5% Epithelialization: Small (1-33%) Tunneling: No Undermining: Yes Starting Position (o'clock): 4 Ending Position (o'clock): 6 Maximum Distance: (cm) 0.9 Wound Description Classification: Full Thickness Without Exposed Support Structures Wound Margin: Distinct, outline attached Exudate Amount: Medium Exudate Type: Serosanguineous Exudate Color: red, brown Foul Odor After Cleansing: No Slough/Fibrino Yes Wound Bed Granulation Amount: Small (1-33%) Exposed Structure Granulation Quality: Red, Pink Fascia Exposed: No Necrotic Amount: Large (67-100%) Fat Layer (Subcutaneous Tissue) Exposed: Yes Necrotic Quality: Eschar, Adherent Slough Tendon Exposed: No Muscle Exposed: No Joint Exposed: No Bone Exposed: No Treatment Notes Wound #1 (Knee) Wound Laterality: Left, Distal Cleanser Soap and Water Discharge Instruction: May shower and wash wound with dial antibacterial soap and water prior to dressing change. Peri-Wound Care Topical Primary Dressing KerraCel Ag Gelling Fiber Dressing, 4x5 in (silver alginate) Discharge Instruction: Lightly pack in the wound bed Secondary Dressing Woven Gauze Sponge, Non-Sterile 4x4 in Discharge Instruction: Apply over primary dressing as directed. ABD Pad, 8x10 Discharge Instruction: Apply over primary dressing as directed. Secured With [employer], 4.5x3.1 (in/yd) Discharge Instruction: Secure with Kerlix as directed. 51M Medipore Soft Cloth Surgical T 2x10 (in/yd) ape Discharge Instruction: Secure with tape as directed. Compression Wrap Compression Stockings Add-Ons Electronic Signature(s) Signed: 03/19/2021 5:37:29 PM By: Rhae Hammock RN Signed: 03/19/2021 6:13:38 PM By: Levan Hurst RN, BSN Entered By: Rhae Hammock on 03/18/2021  13:11:42 -------------------------------------------------------------------------------- Wound Assessment Details Patient Name: Date of Service: Madison Corporal, [place] [name] 03/18/2021 1:00 PM Medical Record Number: [id number] Patient Account Number: [id number] Date of Birth/Sex: Treating RN: 1950/08/18 (71 y.o. Madison Collins Primary Care Maybell Misenheimer: Jacolyn Reedy Other Clinician: Referring Gailya Tauer: Treating Loyalty Arentz/Extender: Arline Asp in Treatment: 1 Wound Status Wound Number: 2 Primary Abrasion Etiology: Wound Location: Left, Proximal Knee Wound Status: Open Wounding Event: Trauma Comorbid Cataracts, Hypertension, Hepatitis C, Osteoarthritis, Date Acquired: 02/24/2021 History: Confinement Anxiety Weeks Of Treatment: 1 Clustered Wound: No Photos Wound Measurements Length: (cm) Width: (cm) Depth: (cm) Area: (cm) Volume: (cm) Wound Description Classification: Full Thickness Without Exposed Support Structures Wound Margin: Distinct, outline attached Exudate Amount: None Present Foul Odor After Cleansing: Slough/Fibrino 0 % Reduction in Area: 100% 0 % Reduction in Volume: 100% 0 Epithelialization: Large (67-100%) 0 Tunneling: No 0 Undermining: No No No Wound Bed Granulation Amount: None Present (0%) Exposed Structure Necrotic Amount: None Present (0%) Fascia Exposed: No Fat Layer (Subcutaneous Tissue) Exposed: No Tendon Exposed: No Muscle Exposed: No Joint Exposed: No Bone Exposed: No Electronic Signature(s) Signed: 03/19/2021 5:37:29 PM By: Rhae Hammock RN Signed: 03/19/2021 6:13:38 PM By: Levan Hurst RN, BSN Entered By: Rhae Hammock on 03/18/2021 13:14:45 -------------------------------------------------------------------------------- Wound Assessment Details Patient Name: Date of Service: Madison Corporal, [place] [name] 03/18/2021 1:00 PM Medical Record Number: [id number] Patient Account Number: [id number] Date of  Birth/Sex: Treating RN: 12/20/1950 (71 y.o. Madison Collins Primary Care Joshus Rogan: Jacolyn Reedy Other Clinician: Referring Reagan Behlke: Treating Shann Merrick/Extender: Arline Asp in Treatment: 1 Wound Status Wound Number: 3 Primary Abrasion Etiology: Wound Location: Left, Medial Upper Leg Wound Status: Open Wounding Event: Trauma Comorbid Cataracts, Hypertension, Hepatitis C, Osteoarthritis, Date Acquired: 02/24/2021 History: Confinement  Anxiety Weeks Of Treatment: 1 Clustered Wound: No Photos Wound Measurements Length: (cm) Width: (cm) Depth: (cm) Area: (cm) Volume: (cm) 0 % Reduction in Area: 100% 0 % Reduction in Volume: 100% 0 Epithelialization: Large (67-100%) 0 Tunneling: No 0 Undermining: No Wound Description Classification: Full Thickness Without Exposed Support Structures Wound Margin: Distinct, outline attached Exudate Amount: None Present Foul Odor After Cleansing: No Slough/Fibrino No Wound Bed Granulation Amount: None Present (0%) Exposed Structure Necrotic Amount: None Present (0%) Fascia Exposed: No Fat Layer (Subcutaneous Tissue) Exposed: No Tendon Exposed: No Muscle Exposed: No Joint Exposed: No Bone Exposed: No Electronic Signature(s) Signed: 03/19/2021 5:37:29 PM By: Rhae Hammock RN Signed: 03/19/2021 6:13:38 PM By: Levan Hurst RN, BSN Entered By: Rhae Hammock on 03/18/2021 13:13:34 -------------------------------------------------------------------------------- Vitals Details Patient Name: Date of Service: Madison Corporal, [place] [name] 03/18/2021 1:00 PM Medical Record Number: [id number] Patient Account Number: [id number] Date of Birth/Sex: Treating RN: 22-Feb-1951 (71 y.o. Madison Collins Primary Care Isaura Schiller: Jacolyn Reedy Other Clinician: Referring Wesly Whisenant: Treating Niyana Chesbro/Extender: Arline Asp in Treatment: 1 Vital Signs Time Taken: 13:00 Temperature (F): 97.9 Height (in):  60 Pulse (bpm): 103 Weight (lbs): 160 Respiratory Rate (breaths/min): 16 Body Mass Index (BMI): 31.2 Blood Pressure (mmHg): 172/71 Reference Range: 80 - 120 mg / dl Electronic Signature(s) Signed: 03/19/2021 6:13:38 PM By: Levan Hurst RN, BSN Entered By: Levan Hurst on 03/18/2021 13:01:01

## 2021-03-19 NOTE — Progress Notes (Signed)
SHARVI, MOONEYHAN ([id number]) Visit Report for 03/18/2021 Debridement Details Patient Name: Date of Service: Madison Collins, [place] Madison P. 03/18/2021 1:00 PM Medical Record Number: [id number] Patient Account Number: [id number] Date of Birth/Sex: Treating RN: [birth year] (71 y.o. Madison Collins, Madison Collins Primary Care Provider: Jacolyn Collins Other Clinician: Referring Provider: Treating Provider/Extender: Madison Collins in Treatment: 1 Debridement Performed for Assessment: Wound #1 Left,Distal Knee Performed By: Physician Madison Collins., MD Debridement Type: Debridement Level of Consciousness (Pre-procedure): Awake and Alert Pre-procedure Verification/Time Out Yes - 13:19 Taken: Start Time: 13:19 T Area Debrided (L x W): otal 3.2 (cm) x 2.8 (cm) = 8.96 (cm) Tissue and other material debrided: Viable, Non-Viable, Slough, Subcutaneous, Slough Level: Skin/Subcutaneous Tissue Debridement Description: Excisional Instrument: Curette Bleeding: Minimum Hemostasis Achieved: Pressure End Time: 13:22 Procedural Pain: 4 Post Procedural Pain: 2 Response to Treatment: Procedure was tolerated well Level of Consciousness (Post- Awake and Alert procedure): Post Debridement Measurements of Total Wound Length: (cm) 3.2 Width: (cm) 2.8 Depth: (cm) 0.7 Volume: (cm) 4.926 Character of Wound/Ulcer Post Debridement: Improved Post Procedure Diagnosis Same as Pre-procedure Electronic Signature(s) Signed: 03/18/2021 4:28:28 PM By: Madison Ham MD Signed: 03/19/2021 5:37:29 PM By: Madison Hammock RN Entered By: Madison Collins on 03/18/2021 13:31:43 -------------------------------------------------------------------------------- HPI Details Patient Name: Date of Service: Madison Collins, Madison Madison P. 03/18/2021 1:00 PM Medical Record Number: [id number] Patient Account Number: [id number] Date of Birth/Sex: Treating RN: Madison Collins (71 y.o. Madison Collins, Madison Collins Primary Care Provider: Jacolyn Collins Other Clinician: Referring Provider: Treating Provider/Extender: Madison Collins in Treatment: 1 History of Present Illness HPI Description: ADMISSION 03/11/2021 This is a 70 year old woman who was problem began sometime after Christmas when she had a fell at work down stairs. She had an abrasion on her left knee. She was seen in the ER on 02/24/2021 and x-ray was negative. I do not believe she was given antibiotics. Sometime after this she was also seen by orthopedics perhaps on 7 January although I do not see an note here. After this she developed a rapidly increasing swelling over the left infrapatellar tendon area very painful. She also has a new wound on the left distal thigh above the patella. She is not on any antibiotics. Past medical history includes chronic pain, hypertension, depression, variably type 2 diabetes or prediabetic, hypercholesterolemia and osteoarthritis of the knees 1/25; only the abscess of the infrapatellar tendon on the left as left. The area that was on the thigh and more distally on the lower leg are healed. Culture of this grew MRSA and some strep. I had her on doxycycline and this is worked Pensions consultant) Signed: 03/18/2021 4:28:28 PM By: Madison Ham MD Entered By: Madison Collins on 03/18/2021 13:32:27 -------------------------------------------------------------------------------- Physical Exam Details Patient Name: Date of Service: Madison Collins, Madison Madison P. 03/18/2021 1:00 PM Medical Record Number: [id number] Patient Account Number: [id number] Date of Birth/Sex: Treating RN: [birth year] (71 y.o. Madison Collins Primary Care Provider: Jacolyn Collins Other Clinician: Referring Provider: Treating Provider/Extender: Madison Collins in Treatment: 1 Constitutional Patient is hypertensive.. Pulse regular and within target range for patient.Marland Kitchen Respirations regular, non-labored and within target range..  Temperature is normal and within the target range for the patient.Marland Kitchen Appears in no distress. Notes Wound exam; the area over the infrapatellar tendon looks a lot better however this was the major wound and source of the abscess. The area on the left distal thigh and the more distal area on her infrapatellar tendon  remain healed. There is no surrounding erythema here. I still used a #5 curette to debride a very gritty nonviable surface to being in the subcutaneous tissue hemostasis with direct pressure Electronic Signature(s) Signed: 03/18/2021 4:28:28 PM By: Madison Ham MD Entered By: Madison Collins on 03/18/2021 13:33:36 -------------------------------------------------------------------------------- Physician Orders Details Patient Name: Date of Service: Madison Collins, Madison Madison P. 03/18/2021 1:00 PM Medical Record Number: [id number] Patient Account Number: [id number] Date of Birth/Sex: Treating RN: [birth year] (71 y.o. Madison Collins Primary Care Provider: Jacolyn Collins Other Clinician: Referring Provider: Treating Provider/Extender: Madison Collins in Treatment: 1 Verbal / Phone Orders: No Diagnosis Coding ICD-10 Coding Code Description [phone] Non-pressure chronic ulcer of other part of left lower leg with other specified severity M65.062 Abscess of tendon sheath, left lower leg L97.121 Non-pressure chronic ulcer of left thigh limited to breakdown of skin Follow-up Appointments ppointment in 1 week. - Dr Dellia Nims Return A Other: - [facility] Bathing/ Shower/ Hygiene May shower and wash wound with soap and water. Edema Control - Lymphedema / SCD / Other Left Lower Extremity Elevate legs to the level of the heart or above for 30 minutes daily and/or when sitting, a frequency of: - throughout the day Wound Treatment Wound #1 - Knee Wound Laterality: Left, Distal Cleanser: Soap and Water 1 x Per Day/30 Days Discharge Instructions: May shower and wash wound with  dial antibacterial soap and water prior to dressing change. Prim Dressing: KerraCel Ag Gelling Fiber Dressing, 4x5 in (silver alginate) ary 1 x Per Day/30 Days Discharge Instructions: Lightly pack in the wound bed Secondary Dressing: Woven Gauze Sponge, Non-Sterile 4x4 in 1 x Per Day/30 Days Discharge Instructions: Apply over primary dressing as directed. Secondary Dressing: ABD Pad, 8x10 1 x Per Day/30 Days Discharge Instructions: Apply over primary dressing as directed. Secured With: [employer], 4.5x3.1 (in/yd) 1 x Per Day/30 Days Discharge Instructions: Secure with Kerlix as directed. Secured With: 16M Medipore Public affairs consultant Surgical T 2x10 (in/yd) 1 x Per Day/30 Days ape Discharge Instructions: Secure with tape as directed. Patient Medications llergies: adhesive tape, latex A Notifications Medication Indication Start End abscess mrsa 03/18/2021 doxycycline monohydrate DOSE oral 100 mg capsule - 1 capsule oral bid for a furtheer 3 days Electronic Signature(s) Signed: 03/18/2021 1:36:42 PM By: Madison Ham MD Entered By: Madison Collins on 03/18/2021 13:36:41 -------------------------------------------------------------------------------- Problem List Details Patient Name: Date of Service: Madison Collins, [place] Madison P. 03/18/2021 1:00 PM Medical Record Number: [id number] Patient Account Number: [id number] Date of Birth/Sex: Treating RN: [birth year] (71 y.o. Madison Collins Primary Care Provider: Jacolyn Collins Other Clinician: Referring Provider: Treating Provider/Extender: Madison Collins in Treatment: 1 Active Problems ICD-10 Encounter Code Description Active Date MDM Diagnosis [phone] Non-pressure chronic ulcer of other part of left lower leg with other specified 03/11/2021 No Yes severity M65.062 Abscess of tendon sheath, left lower leg 03/11/2021 No Yes L97.121 Non-pressure chronic ulcer of left thigh limited to breakdown of skin 03/11/2021 No  Yes Inactive Problems Resolved Problems Electronic Signature(s) Signed: 03/18/2021 4:28:28 PM By: Madison Ham MD Entered By: Madison Collins on 03/18/2021 13:31:01 -------------------------------------------------------------------------------- Progress Note Details Patient Name: Date of Service: Madison Collins, [place] Madison P. 03/18/2021 1:00 PM Medical Record Number: [id number] Patient Account Number: [id number] Date of Birth/Sex: Treating RN: [birth year] (71 y.o. Madison Collins Primary Care Provider: Jacolyn Collins Other Clinician: Referring Provider: Treating Provider/Extender: Madison Collins in Treatment: 1 Subjective History of Present Illness (HPI) ADMISSION 03/11/2021  This is a 71 year old woman who was problem began sometime after Christmas when she had a fell at work down stairs. She had an abrasion on her left knee. She was seen in the ER on 02/24/2021 and x-ray was negative. I do not believe she was given antibiotics. Sometime after this she was also seen by orthopedics perhaps on 7 January although I do not see an note here. After this she developed a rapidly increasing swelling over the left infrapatellar tendon area very painful. She also has a new wound on the left distal thigh above the patella. She is not on any antibiotics. Past medical history includes chronic pain, hypertension, depression, variably type 2 diabetes or prediabetic, hypercholesterolemia and osteoarthritis of the knees 1/25; only the abscess of the infrapatellar tendon on the left as left. The area that was on the thigh and more distally on the lower leg are healed. Culture of this grew MRSA and some strep. I had her on doxycycline and this is worked well Objective Constitutional Patient is hypertensive.. Pulse regular and within target range for patient.Marland Kitchen Respirations regular, non-labored and within target range.. Temperature is normal and within the target range for the patient.Marland Kitchen Appears  in no distress. Vitals Time Taken: 1:00 PM, Height: 60 in, Weight: 160 lbs, BMI: 31.2, Temperature: 97.9 F, Pulse: 103 bpm, Respiratory Rate: 16 breaths/min, Blood Pressure: 172/71 mmHg. General Notes: Wound exam; the area over the infrapatellar tendon looks a lot better however this was the major wound and source of the abscess. The area on the left distal thigh and the more distal area on her infrapatellar tendon remain healed. There is no surrounding erythema here. I still used a #5 curette to debride a very gritty nonviable surface to being in the subcutaneous tissue hemostasis with direct pressure Integumentary (Hair, Skin) Wound #1 status is Open. Original cause of wound was Trauma. The date acquired was: 02/24/2021. The wound has been in treatment 1 weeks. The wound is located on the Left,Distal Knee. The wound measures 3.2cm length x 2.8cm width x 0.7cm depth; 7.037cm^2 area and 4.926cm^3 volume. There is Fat Layer (Subcutaneous Tissue) exposed. There is no tunneling noted, however, there is undermining starting at 4:00 and ending at 6:00 with a maximum distance of 0.9cm. There is a medium amount of serosanguineous drainage noted. The wound margin is distinct with the outline attached to the wound base. There is small (1-33%) red, pink granulation within the wound bed. There is a large (67-100%) amount of necrotic tissue within the wound bed including Eschar and Adherent Slough. Wound #2 status is Open. Original cause of wound was Trauma. The date acquired was: 02/24/2021. The wound has been in treatment 1 weeks. The wound is located on the Left,Proximal Knee. The wound measures 0cm length x 0cm width x 0cm depth; 0cm^2 area and 0cm^3 volume. There is no tunneling or undermining noted. There is a none present amount of drainage noted. The wound margin is distinct with the outline attached to the wound base. There is no granulation within the wound bed. There is no necrotic tissue within the  wound bed. Wound #3 status is Open. Original cause of wound was Trauma. The date acquired was: 02/24/2021. The wound has been in treatment 1 weeks. The wound is located on the Left,Medial Upper Leg. The wound measures 0cm length x 0cm width x 0cm depth; 0cm^2 area and 0cm^3 volume. There is no tunneling or undermining noted. There is a none present amount of drainage noted. The  wound margin is distinct with the outline attached to the wound base. There is no granulation within the wound bed. There is no necrotic tissue within the wound bed. Assessment Active Problems ICD-10 Non-pressure chronic ulcer of other part of left lower leg with other specified severity Abscess of tendon sheath, left lower leg Non-pressure chronic ulcer of left thigh limited to breakdown of skin Procedures Wound #1 Pre-procedure diagnosis of Wound #1 is an Abscess located on the Left,Distal Knee . There was a Excisional Skin/Subcutaneous Tissue Debridement with a total area of 8.96 sq cm performed by Madison Collins., MD. With the following instrument(s): Curette to remove Viable and Non-Viable tissue/material. Material removed includes Subcutaneous Tissue and Slough and. No specimens were taken. A time out was conducted at 13:19, prior to the start of the procedure. A Minimum amount of bleeding was controlled with Pressure. The procedure was tolerated well with a pain level of 4 throughout and a pain level of 2 following the procedure. Post Debridement Measurements: 3.2cm length x 2.8cm width x 0.7cm depth; 4.926cm^3 volume. Character of Wound/Ulcer Post Debridement is improved. Post procedure Diagnosis Wound #1: Same as Pre-Procedure Plan Follow-up Appointments: Return Appointment in 1 week. - Dr Dellia Nims Other: - [facility] Bathing/ Shower/ Hygiene: May shower and wash wound with soap and water. Edema Control - Lymphedema / SCD / Other: Elevate legs to the level of the heart or above for 30 minutes daily and/or  when sitting, a frequency of: - throughout the day WOUND #1: - Knee Wound Laterality: Left, Distal Cleanser: Soap and Water 1 x Per Day/30 Days Discharge Instructions: May shower and wash wound with dial antibacterial soap and water prior to dressing change. Prim Dressing: KerraCel Ag Gelling Fiber Dressing, 4x5 in (silver alginate) 1 x Per Day/30 Days ary Discharge Instructions: Lightly pack in the wound bed Secondary Dressing: Woven Gauze Sponge, Non-Sterile 4x4 in 1 x Per Day/30 Days Discharge Instructions: Apply over primary dressing as directed. Secondary Dressing: ABD Pad, 8x10 1 x Per Day/30 Days Discharge Instructions: Apply over primary dressing as directed. Secured With: [employer], 4.5x3.1 (in/yd) 1 x Per Day/30 Days Discharge Instructions: Secure with Kerlix as directed. Secured With: 25M Medipore Public affairs consultant Surgical T 2x10 (in/yd) 1 x Per Day/30 Days ape Discharge Instructions: Secure with tape as directed. 1. Much better looking wound still going to use silver alginate 2. Extend her doxycycline for further 3 days 3. This is still a punched-out area. I had some thoughts about how we are going to promote granulation to fill in this wound however this is a premature discussion. Whether we could consider a snapback or perhaps an advantage product we will have to see. 4. Nice improvement this week 2 of the 3 wounds are healed Electronic Signature(s) Signed: 03/18/2021 4:28:28 PM By: Madison Ham MD Entered By: Madison Collins on 03/18/2021 13:34:58 -------------------------------------------------------------------------------- SuperBill Details Patient Name: Date of Service: Madison Collins, Madison Madison P. 03/18/2021 Medical Record Number: [id number] Patient Account Number: [id number] Date of Birth/Sex: Treating RN: [birth year] (71 y.o. Madison Collins, Madison Collins Primary Care Provider: Jacolyn Collins Other Clinician: Referring Provider: Treating Provider/Extender: Madison Collins in Treatment: 1 Diagnosis Coding ICD-10 Codes Code Description [phone] Non-pressure chronic ulcer of other part of left lower leg with other specified severity M65.062 Abscess of tendon sheath, left lower leg L97.121 Non-pressure chronic ulcer of left thigh limited to breakdown of skin Facility Procedures CPT4 Code: [id number] Description: 11042 - DEB SUBQ TISSUE 20 SQ  CM/< ICD-10 Diagnosis Description L97.828 Non-pressure chronic ulcer of other part of left lower leg with other specified M65.062 Abscess of tendon sheath, left lower leg Modifier: severity Quantity: 1 Physician Procedures : CPT4 Code Description Modifier [id number] 11042 - WC PHYS SUBQ TISS 20 SQ CM ICD-10 Diagnosis Description L97.828 Non-pressure chronic ulcer of other part of left lower leg with other specified severity M65.062 Abscess of tendon sheath, left lower leg Quantity: 1 Electronic Signature(s) Signed: 03/18/2021 4:28:28 PM By: Madison Ham MD Entered By: Madison Collins on 03/18/2021 13:37:05

## 2021-03-26 ENCOUNTER — Other Ambulatory Visit: Payer: Self-pay

## 2021-03-26 ENCOUNTER — Encounter (HOSPITAL_BASED_OUTPATIENT_CLINIC_OR_DEPARTMENT_OTHER): Payer: PPO | Attending: Internal Medicine | Admitting: Internal Medicine

## 2021-03-26 DIAGNOSIS — L97828 Non-pressure chronic ulcer of other part of left lower leg with other specified severity: Secondary | ICD-10-CM | POA: Insufficient documentation

## 2021-03-26 DIAGNOSIS — L97121 Non-pressure chronic ulcer of left thigh limited to breakdown of skin: Secondary | ICD-10-CM | POA: Diagnosis not present

## 2021-03-26 DIAGNOSIS — M65062 Abscess of tendon sheath, left lower leg: Secondary | ICD-10-CM | POA: Insufficient documentation

## 2021-03-26 DIAGNOSIS — L97822 Non-pressure chronic ulcer of other part of left lower leg with fat layer exposed: Secondary | ICD-10-CM | POA: Diagnosis not present

## 2021-03-27 DIAGNOSIS — S81802A Unspecified open wound, left lower leg, initial encounter: Secondary | ICD-10-CM | POA: Diagnosis not present

## 2021-03-30 NOTE — Progress Notes (Signed)
DENVER, HARDER ([id number]) Visit Report for 03/26/2021 HPI Details Patient Name: Date of Service: Madison [place], [place] [name] 03/26/2021 1:45 PM Medical Record Number: [id number] Patient Account Number: [id number] Date of Birth/Sex: Treating RN: 1951/02/13 (71 y.o. [name] Primary Care Provider: Jacolyn Reedy Other Clinician: Referring Provider: Treating Provider/Extender: Arline Asp in Treatment: 2 History of Present Illness HPI Description: ADMISSION 03/11/2021 This is a 71 year old woman who was problem began sometime after Christmas when she had a fell at work down stairs. She had an abrasion on her left knee. She was seen in the ER on 02/24/2021 and x-ray was negative. I do not believe she was given antibiotics. Sometime after this she was also seen by orthopedics perhaps on 7 January although I do not see an note here. After this she developed a rapidly increasing swelling over the left infrapatellar tendon area very painful. She also has a new wound on the left distal thigh above the patella. She is not on any antibiotics. Past medical history includes chronic pain, hypertension, depression, variably type 2 diabetes or prediabetic, hypercholesterolemia and osteoarthritis of the knees 1/25; only the abscess of the infrapatellar tendon on the left as left. The area that was on the thigh and more distally on the lower leg are healed. Culture of this grew MRSA and some strep. I had her on doxycycline and this is worked well 2/2; infrapatellar tendon on the left only wound that is remaining. This was initially an abscess. She has been using silver alginate. I gave her a course of doxycycline for MRSA and strep this is worked well. Electronic Signature(s) Signed: 03/26/2021 4:50:25 PM By: Linton Ham MD Entered By: Linton Ham on 03/26/2021 14:11:37 -------------------------------------------------------------------------------- Physical Exam  Details Patient Name: Date of Service: Madison Corporal, [place] [name] 03/26/2021 1:45 PM Medical Record Number: [id number] Patient Account Number: [id number] Date of Birth/Sex: Treating RN: December 28, 1950 (71 y.o. [name] Primary Care Provider: Jacolyn Reedy Other Clinician: Referring Provider: Treating Provider/Extender: Arline Asp in Treatment: 2 Constitutional Sitting or standing Blood Pressure is within target range for patient.. Pulse regular and within target range for patient.Marland Kitchen Respirations regular, non-labored and within target range.. Temperature is normal and within the target range for the patient.Marland Kitchen Appears in no distress. Notes Wound exam; the area over the infrapatellar tendon has a healthy granulated surface although it still a sizable punched-out area about the size of a $0.50 piece. Surface of the wound looks healthy even under illumination there is no surrounding infection. No purulent drainage. Electronic Signature(s) Signed: 03/26/2021 4:50:25 PM By: Linton Ham MD Entered By: Linton Ham on 03/26/2021 14:18:23 -------------------------------------------------------------------------------- Physician Orders Details Patient Name: Date of Service: Madison Corporal, [place] [name] 03/26/2021 1:45 PM Medical Record Number: [id number] Patient Account Number: [id number] Date of Birth/Sex: Treating RN: 13-Dec-1950 (71 y.o. [name] Primary Care Provider: Jacolyn Reedy Other Clinician: Referring Provider: Treating Provider/Extender: Arline Asp in Treatment: 2 Verbal / Phone Orders: No Diagnosis Coding ICD-10 Coding Code Description [phone] Non-pressure chronic ulcer of other part of left lower leg with other specified severity M65.062 Abscess of tendon sheath, left lower leg L97.121 Non-pressure chronic ulcer of left thigh limited to breakdown of skin Follow-up Appointments ppointment in 1 week. - Dr Dellia Nims Return A Other:  - [facility] Bathing/ Shower/ Hygiene May shower and wash wound with soap and water. Edema Control - Lymphedema / SCD / Other Left Lower Extremity Elevate  legs to the level of the heart or above for 30 minutes daily and/or when sitting, a frequency of: - throughout the day Wound Treatment Wound #1 - Knee Wound Laterality: Left, Distal Cleanser: Normal Saline Every Other Day/15 Days Discharge Instructions: Cleanse the wound with Normal Saline prior to applying a clean dressing using gauze sponges, not tissue or cotton balls. Cleanser: Soap and Water Every Other Day/15 Days Discharge Instructions: May shower and wash wound with dial antibacterial soap and water prior to dressing change. Prim Dressing: Promogran Prisma Matrix, 4.34 (sq in) (silver collagen) Every Other Day/15 Days ary Discharge Instructions: Moisten collagen with saline or hydrogel Secondary Dressing: Woven Gauze Sponge, Non-Sterile 4x4 in Every Other Day/15 Days Discharge Instructions: Apply over primary dressing as directed. Secondary Dressing: ABD Pad, 8x10 Every Other Day/15 Days Discharge Instructions: Apply over primary dressing as directed. Secured With: [employer], 4.5x3.1 (in/yd) Every Other Day/15 Days Discharge Instructions: Secure with Kerlix as directed. Secured With: 16M Medipore Public affairs consultant Surgical T 2x10 (in/yd) Every Other Day/15 Days ape Discharge Instructions: Secure with tape as directed. Electronic Signature(s) Signed: 03/26/2021 4:50:25 PM By: Linton Ham MD Signed: 03/30/2021 2:05:18 PM By: Levan Hurst RN, BSN Entered By: Levan Hurst on 03/26/2021 14:09:49 -------------------------------------------------------------------------------- Problem List Details Patient Name: Date of Service: Madison Corporal, [place] [name] 03/26/2021 1:45 PM Medical Record Number: [id number] Patient Account Number: [id number] Date of Birth/Sex: Treating RN: May 07, 1950 (71 y.o. Elam Dutch Primary Care  Provider: Jacolyn Reedy Other Clinician: Referring Provider: Treating Provider/Extender: Arline Asp in Treatment: 2 Active Problems ICD-10 Encounter Code Description Active Date MDM Diagnosis [phone] Non-pressure chronic ulcer of other part of left lower leg with other specified 03/11/2021 No Yes severity M65.062 Abscess of tendon sheath, left lower leg 03/11/2021 No Yes L97.121 Non-pressure chronic ulcer of left thigh limited to breakdown of skin 03/11/2021 No Yes Inactive Problems Resolved Problems Electronic Signature(s) Signed: 03/26/2021 4:50:25 PM By: Linton Ham MD Entered By: Linton Ham on 03/26/2021 14:10:32 -------------------------------------------------------------------------------- Progress Note Details Patient Name: Date of Service: Madison Corporal, [place] [name] 03/26/2021 1:45 PM Medical Record Number: [id number] Patient Account Number: [id number] Date of Birth/Sex: Treating RN: 04/29/1950 (71 y.o. [name] Primary Care Provider: Jacolyn Reedy Other Clinician: Referring Provider: Treating Provider/Extender: Arline Asp in Treatment: 2 Subjective History of Present Illness (HPI) ADMISSION 03/11/2021 This is a 71 year old woman who was problem began sometime after Christmas when she had a fell at work down stairs. She had an abrasion on her left knee. She was seen in the ER on 02/24/2021 and x-ray was negative. I do not believe she was given antibiotics. Sometime after this she was also seen by orthopedics perhaps on 7 January although I do not see an note here. After this she developed a rapidly increasing swelling over the left infrapatellar tendon area very painful. She also has a new wound on the left distal thigh above the patella. She is not on any antibiotics. Past medical history includes chronic pain, hypertension, depression, variably type 2 diabetes or prediabetic, hypercholesterolemia and osteoarthritis of  the knees 1/25; only the abscess of the infrapatellar tendon on the left as left. The area that was on the thigh and more distally on the lower leg are healed. Culture of this grew MRSA and some strep. I had her on doxycycline and this is worked well 2/2; infrapatellar tendon on the left only wound that is remaining. This was initially an abscess. She  has been using silver alginate. I gave her a course of doxycycline for MRSA and strep this is worked well. Objective Constitutional Sitting or standing Blood Pressure is within target range for patient.. Pulse regular and within target range for patient.Marland Kitchen Respirations regular, non-labored and within target range.. Temperature is normal and within the target range for the patient.Marland Kitchen Appears in no distress. Vitals Time Taken: 1:33 PM, Height: 60 in, Weight: 160 lbs, BMI: 31.2, Temperature: 97.8 F, Pulse: 81 bpm, Respiratory Rate: 16 breaths/min, Blood Pressure: 126/70 mmHg. General Notes: Wound exam; the area over the infrapatellar tendon has a healthy granulated surface although it still a sizable punched-out area about the size of a $0.50 piece. Surface of the wound looks healthy even under illumination there is no surrounding infection. No purulent drainage. Integumentary (Hair, Skin) Wound #1 status is Open. Original cause of wound was Trauma. The date acquired was: 02/24/2021. The wound has been in treatment 2 weeks. The wound is located on the Left,Distal Knee. The wound measures 2cm length x 2.9cm width x 0.3cm depth; 4.555cm^2 area and 1.367cm^3 volume. There is Fat Layer (Subcutaneous Tissue) exposed. There is no tunneling noted, however, there is undermining starting at 12:00 and ending at 1:00 with a maximum distance of 0.3cm. There is a medium amount of serosanguineous drainage noted. The wound margin is distinct with the outline attached to the wound base. There is large (67-100%) red, pink granulation within the wound bed. There is a small  (1-33%) amount of necrotic tissue within the wound bed including Adherent Slough. Assessment Active Problems ICD-10 Non-pressure chronic ulcer of other part of left lower leg with other specified severity Abscess of tendon sheath, left lower leg Non-pressure chronic ulcer of left thigh limited to breakdown of skin Plan Follow-up Appointments: Return Appointment in 1 week. - Dr Dellia Nims Other: - [facility] Bathing/ Shower/ Hygiene: May shower and wash wound with soap and water. Edema Control - Lymphedema / SCD / Other: Elevate legs to the level of the heart or above for 30 minutes daily and/or when sitting, a frequency of: - throughout the day WOUND #1: - Knee Wound Laterality: Left, Distal Cleanser: Normal Saline Every Other Day/15 Days Discharge Instructions: Cleanse the wound with Normal Saline prior to applying a clean dressing using gauze sponges, not tissue or cotton balls. Cleanser: Soap and Water Every Other Day/15 Days Discharge Instructions: May shower and wash wound with dial antibacterial soap and water prior to dressing change. Prim Dressing: Promogran Prisma Matrix, 4.34 (sq in) (silver collagen) Every Other Day/15 Days ary Discharge Instructions: Moisten collagen with saline or hydrogel Secondary Dressing: Woven Gauze Sponge, Non-Sterile 4x4 in Every Other Day/15 Days Discharge Instructions: Apply over primary dressing as directed. Secondary Dressing: ABD Pad, 8x10 Every Other Day/15 Days Discharge Instructions: Apply over primary dressing as directed. Secured With: [employer], 4.5x3.1 (in/yd) Every Other Day/15 Days Discharge Instructions: Secure with Kerlix as directed. Secured With: 63M Medipore Public affairs consultant Surgical T 2x10 (in/yd) Every Other Day/15 Days ape Discharge Instructions: Secure with tape as directed. 1. I changed the dressing from silver alginate to silver collagen hoping to stimulate some granulation. 2. She has [facility] not sure which of the advanced  products she might be eligible for. This was initially an abscess Electronic Signature(s) Signed: 03/26/2021 4:50:25 PM By: Linton Ham MD Entered By: Linton Ham on 03/26/2021 14:19:25 -------------------------------------------------------------------------------- SuperBill Details Patient Name: Date of Service: Madison Corporal, [place] [name] 03/26/2021 Medical Record Number: [id number] Patient Account Number:  [id number] Date of Birth/Sex: Treating RN: 1950/03/14 (71 y.o. [name] Primary Care Provider: Jacolyn Reedy Other Clinician: Referring Provider: Treating Provider/Extender: Arline Asp in Treatment: 2 Diagnosis Coding ICD-10 Codes Code Description [phone] Non-pressure chronic ulcer of other part of left lower leg with other specified severity M65.062 Abscess of tendon sheath, left lower leg L97.121 Non-pressure chronic ulcer of left thigh limited to breakdown of skin Facility Procedures CPT4 Code: [id number] Description: 99213 - WOUND CARE VISIT-LEV 3 EST PT Modifier: Quantity: 1 Physician Procedures : CPT4 Code Description Modifier [id number] - WC PHYS LEVEL 3 - EST PT ICD-10 Diagnosis Description L97.828 Non-pressure chronic ulcer of other part of left lower leg with other specified severity M65.062 Abscess of tendon sheath, left lower leg Quantity: 1 Electronic Signature(s) Signed: 03/26/2021 4:50:25 PM By: Linton Ham MD Signed: 03/30/2021 2:05:18 PM By: Levan Hurst RN, BSN Entered By: Levan Hurst on 03/26/2021 16:37:16

## 2021-03-30 NOTE — Progress Notes (Signed)
CHAVONNE, SFORZA ([id number]) Visit Report for 03/26/2021 Arrival Information Details Patient Name: Date of Service: Madison Collins, [place] Madison P. 03/26/2021 1:45 PM Medical Record Number: [id number] Patient Account Number: [id number] Date of Birth/Sex: Treating RN: May 29, 1950 (71 y.o. Madison Collins, Madison Collins Primary Care Madison Collins: Madison Collins Other Clinician: Referring Madison Collins: Treating Madison Collins/Extender: Madison Collins in Treatment: 2 Visit Information History Since Last Visit Added or deleted any medications: No Patient Arrived: Ambulatory Any new allergies or adverse reactions: No Arrival Time: 13:32 Had a fall or experienced change in No Accompanied By: self activities of daily living that may affect Transfer Assistance: None risk of falls: Patient Identification Verified: Yes Signs or symptoms of abuse/neglect since last visito No Secondary Verification Process Completed: Yes Hospitalized since last visit: No Patient Requires Transmission-Based Precautions: No Implantable device outside of the clinic excluding No Patient Has Alerts: No cellular tissue based products placed in the center since last visit: Has Dressing in Place as Prescribed: Yes Pain Present Now: Yes Electronic Signature(s) Signed: 03/26/2021 1:34:24 PM By: Sandre Kitty Entered By: Sandre Kitty on 03/26/2021 13:33:15 -------------------------------------------------------------------------------- Clinic Level of Care Assessment Details Patient Name: Date of ServiceMarlyn Collins, [place] Madison P. 03/26/2021 1:45 PM Medical Record Number: [id number] Patient Account Number: [id number] Date of Birth/Sex: Treating RN: Jun 26, 1950 (71 y.o. Madison Collins Primary Care Madison Collins: Madison Collins Other Clinician: Referring Madison Collins: Treating Madison Collins/Extender: Madison Collins in Treatment: 2 Clinic Level of Care Assessment Items TOOL 4 Quantity Score X- 1 0 Use when only an EandM is  performed on FOLLOW-UP visit ASSESSMENTS - Nursing Assessment / Reassessment X- 1 10 Reassessment of Co-morbidities (includes updates in patient status) X- 1 5 Reassessment of Adherence to Treatment Plan ASSESSMENTS - Wound and Skin A ssessment / Reassessment X - Simple Wound Assessment / Reassessment - one wound 1 5 []  - 0 Complex Wound Assessment / Reassessment - multiple wounds []  - 0 Dermatologic / Skin Assessment (not related to wound area) ASSESSMENTS - Focused Assessment []  - 0 Circumferential Edema Measurements - multi extremities []  - 0 Nutritional Assessment / Counseling / Intervention []  - 0 Lower Extremity Assessment (monofilament, tuning fork, pulses) []  - 0 Peripheral Arterial Disease Assessment (using hand held doppler) ASSESSMENTS - Ostomy and/or Continence Assessment and Care []  - 0 Incontinence Assessment and Management []  - 0 Ostomy Care Assessment and Management (repouching, etc.) PROCESS - Coordination of Care X - Simple Patient / Family Education for ongoing care 1 15 []  - 0 Complex (extensive) Patient / Family Education for ongoing care []  - 0 Staff obtains Programmer, systems, Records, T Results / Process Orders est []  - 0 Staff telephones HHA, Nursing Homes / Clarify orders / etc []  - 0 Routine Transfer to another Facility (non-emergent condition) []  - 0 Routine Hospital Admission (non-emergent condition) []  - 0 New Admissions / Biomedical engineer / Ordering NPWT Apligraf, etc. , []  - 0 Emergency Hospital Admission (emergent condition) X- 1 10 Simple Discharge Coordination []  - 0 Complex (extensive) Discharge Coordination PROCESS - Special Needs []  - 0 Pediatric / Minor Patient Management []  - 0 Isolation Patient Management []  - 0 Hearing / Language / Visual special needs []  - 0 Assessment of Community assistance (transportation, D/C planning, etc.) []  - 0 Additional assistance / Altered mentation []  - 0 Support Surface(s) Assessment  (bed, cushion, seat, etc.) INTERVENTIONS - Wound Cleansing / Measurement X - Simple Wound Cleansing - one wound 1 5 []  - 0 Complex Wound Cleansing -  multiple wounds X- 1 5 Wound Imaging (photographs - any number of wounds) []  - 0 Wound Tracing (instead of photographs) X- 1 5 Simple Wound Measurement - one wound []  - 0 Complex Wound Measurement - multiple wounds INTERVENTIONS - Wound Dressings []  - 0 Small Wound Dressing one or multiple wounds X- 1 15 Medium Wound Dressing one or multiple wounds []  - 0 Large Wound Dressing one or multiple wounds []  - 0 Application of Medications - topical []  - 0 Application of Medications - injection INTERVENTIONS - Miscellaneous []  - 0 External ear exam []  - 0 Specimen Collection (cultures, biopsies, blood, body fluids, etc.) []  - 0 Specimen(s) / Culture(s) sent or taken to Lab for analysis []  - 0 Patient Transfer (multiple staff / Civil Service fast streamer / Similar devices) []  - 0 Simple Staple / Suture removal (25 or less) []  - 0 Complex Staple / Suture removal (26 or more) []  - 0 Hypo / Hyperglycemic Management (close monitor of Blood Glucose) []  - 0 Ankle / Brachial Index (ABI) - do not check if billed separately X- 1 5 Vital Signs Has the patient been seen at the hospital within the last three years: Yes Total Score: 80 Level Of Care: New/Established - Level 3 Electronic Signature(s) Signed: 03/30/2021 2:05:18 PM By: Madison Hurst RN, BSN Entered By: Madison Collins on 03/26/2021 16:37:00 -------------------------------------------------------------------------------- Encounter Discharge Information Details Patient Name: Date of Service: Madison Corporal, [place] Madison P. 03/26/2021 1:45 PM Medical Record Number: [id number] Patient Account Number: [id number] Date of Birth/Sex: Treating RN: [birth year] (71 y.o. Madison Collins Primary Care Leianne Callins: Madison Collins Other Clinician: Referring Kentarius Partington: Treating Sabryna Lahm/Extender: Madison Collins in Treatment: 2 Encounter Discharge Information Items Discharge Condition: Stable Ambulatory Status: Ambulatory Discharge Destination: Home Transportation: Private Auto Accompanied By: alone Schedule Follow-up Appointment: Yes Clinical Summary of Care: Patient Declined Electronic Signature(s) Signed: 03/30/2021 2:05:18 PM By: Madison Hurst RN, BSN Entered By: Madison Collins on 03/26/2021 16:39:37 -------------------------------------------------------------------------------- Multi Wound Chart Details Patient Name: Date of Service: Madison Corporal, [place] Madison P. 03/26/2021 1:45 PM Medical Record Number: [id number] Patient Account Number: [id number] Date of Birth/Sex: Treating RN: 10-26-1950 (71 y.o. Madison Collins Primary Care Kyrstal Monterrosa: Madison Collins Other Clinician: Referring Vinaya Sancho: Treating Sheila Ocasio/Extender: Madison Collins in Treatment: 2 Vital Signs Height(in): 60 Pulse(bpm): 81 Weight(lbs): 160 Blood Pressure(mmHg): 126/70 Body Mass Index(BMI): 31.2 Temperature(F): 97.8 Respiratory Rate(breaths/min): 16 Photos: [1:Left, Distal Knee] [N/A:N/A N/A] Wound Location: [1:Trauma] [N/A:N/A] Wounding Event: [1:Abscess] [N/A:N/A] Primary Etiology: [1:Cataracts, Hypertension, Hepatitis C, N/A] Comorbid History: [1:Osteoarthritis, Confinement Anxiety 02/24/2021] [N/A:N/A] Date Acquired: [1:2] [N/A:N/A] Weeks of Treatment: [1:Open] [N/A:N/A] Wound Status: [1:No] [N/A:N/A] Wound Recurrence: [1:2x2.9x0.3] [N/A:N/A] Measurements L x W x D (cm) [1:4.555] [N/A:N/A] A (cm) : rea [1:1.367] [N/A:N/A] Volume (cm) : [1:65.90%] [N/A:N/A] % Reduction in A rea: [1:48.80%] [N/A:N/A] % Reduction in Volume: [1:12] Starting Position 1 (o'clock): [1:1] Ending Position 1 (o'clock): [1:0.3] Maximum Distance 1 (cm): [1:Yes] [N/A:N/A] Undermining: [1:Full Thickness Without Exposed] [N/A:N/A] Classification: [1:Support Structures Medium] [N/A:N/A] Exudate Amount:  [1:Serosanguineous] [N/A:N/A] Exudate Type: [1:red, brown] [N/A:N/A] Exudate Color: [1:Distinct, outline attached] [N/A:N/A] Wound Margin: [1:Large (67-100%)] [N/A:N/A] Granulation Amount: [1:Red, Pink] [N/A:N/A] Granulation Quality: [1:Small (1-33%)] [N/A:N/A] Necrotic Amount: [1:Fat Layer (Subcutaneous Tissue): Yes N/A] Exposed Structures: [1:Fascia: No Tendon: No Muscle: No Joint: No Bone: No None] [N/A:N/A] Treatment Notes Electronic Signature(s) Signed: 03/26/2021 4:50:25 PM By: Linton Ham MD Signed: 03/30/2021 2:05:18 PM By: Madison Hurst RN, BSN Entered By: Linton Ham on 03/26/2021 14:10:46 -------------------------------------------------------------------------------- Multi-Disciplinary  Care Plan Details Patient Name: Date of ServiceMarlyn Collins, [place] Madison P. 03/26/2021 1:45 PM Medical Record Number: [id number] Patient Account Number: [id number] Date of Birth/Sex: Treating RN: 31-Aug-1950 (71 y.o. Madison Collins Primary Care Khalee Mazo: Madison Collins Other Clinician: Referring Sydnei Ohaver: Treating Josey Forcier/Extender: Madison Collins in Treatment: 2 Multidisciplinary Care Plan reviewed with physician Active Inactive Wound/Skin Impairment Nursing Diagnoses: Impaired tissue integrity Goals: Patient/caregiver will verbalize understanding of skin care regimen Date Initiated: 03/18/2021 Target Resolution Date: 04/10/2021 Goal Status: Active Ulcer/skin breakdown will have a volume reduction of 30% by week 4 Date Initiated: 03/18/2021 Target Resolution Date: 04/10/2021 Goal Status: Active Interventions: Assess patient/caregiver ability to obtain necessary supplies Assess patient/caregiver ability to perform ulcer/skin care regimen upon admission and as needed Assess ulceration(s) every visit Provide education on ulcer and skin care Treatment Activities: Referred to DME Krisy Dix for dressing supplies : 03/11/2021 Skin care regimen initiated :  03/11/2021 Notes: Electronic Signature(s) Signed: 03/30/2021 2:05:18 PM By: Madison Hurst RN, BSN Entered By: Madison Collins on 03/26/2021 16:35:27 -------------------------------------------------------------------------------- Pain Assessment Details Patient Name: Date of Service: Madison Corporal, [place] Madison P. 03/26/2021 1:45 PM Medical Record Number: [id number] Patient Account Number: [id number] Date of Birth/Sex: Treating RN: 1950/10/05 (71 y.o. Madison Collins Primary Care Ramona Slinger: Madison Collins Other Clinician: Referring Riot Waterworth: Treating Barb Shear/Extender: Madison Collins in Treatment: 2 Active Problems Location of Pain Severity and Description of Pain Patient Has Paino Yes Site Locations Pain Location: Pain in Ulcers With Dressing Change: Yes Duration of the Pain. Constant / Intermittento Intermittent Rate the pain. Current Pain Level: 6 Worst Pain Level: 9 Least Pain Level: 0 Character of Pain Describe the Pain: Burning Pain Management and Medication Current Pain Management: Medication: Yes Is the Current Pain Management Adequate: Adequate Rest: Yes How does your wound impact your activities of daily livingo Sleep: Yes Bathing: No Appetite: No Relationship With Others: No Bladder Continence: No Emotions: No Bowel Continence: No Work: No Toileting: No Drive: No Dressing: No Hobbies: No Electronic Signature(s) Signed: 03/26/2021 6:25:59 PM By: Baruch Gouty RN, BSN Signed: 03/30/2021 2:05:18 PM By: Madison Hurst RN, BSN Previous Signature: 03/26/2021 1:34:24 PM Version By: Sandre Kitty Entered By: Baruch Gouty on 03/26/2021 13:39:01 -------------------------------------------------------------------------------- Patient/Caregiver Education Details Patient Name: Date of Service: Madison Corporal, [place] Madison P. 2/2/2023andnbsp1:45 PM Medical Record Number: [id number] Patient Account Number: [id number] Date of Birth/Gender: Treating  RN: Jun 18, 1950 (71 y.o. Madison Collins Primary Care Physician: Madison Collins Other Clinician: Referring Physician: Treating Physician/Extender: Madison Collins in Treatment: 2 Education Assessment Education Provided To: Patient Education Topics Provided Wound/Skin Impairment: Methods: Explain/Verbal Responses: State content correctly Electronic Signature(s) Signed: 03/30/2021 2:05:18 PM By: Madison Hurst RN, BSN Entered By: Madison Collins on 03/26/2021 16:35:43 -------------------------------------------------------------------------------- Wound Assessment Details Patient Name: Date of Service: Madison Corporal, [place] Madison P. 03/26/2021 1:45 PM Medical Record Number: [id number] Patient Account Number: [id number] Date of Birth/Sex: Treating RN: 12-07-1950 (71 y.o. Madison Collins Primary Care Alyzae Hawkey: Madison Collins Other Clinician: Referring Caitlynn Ju: Treating Jakhi Dishman/Extender: Madison Collins in Treatment: 2 Wound Status Wound Number: 1 Primary Abscess Etiology: Wound Location: Left, Distal Knee Wound Status: Open Wounding Event: Trauma Comorbid Cataracts, Hypertension, Hepatitis C, Osteoarthritis, Date Acquired: 02/24/2021 History: Confinement Anxiety Weeks Of Treatment: 2 Clustered Wound: No Photos Wound Measurements Length: (cm) 2 Width: (cm) 2.9 Depth: (cm) 0.3 Area: (cm) 4.555 Volume: (cm) 1.367 % Reduction in Area: 65.9% % Reduction in Volume: 48.8% Epithelialization: None Tunneling: No  Undermining: Yes Starting Position (o'clock): 12 Ending Position (o'clock): 1 Maximum Distance: (cm) 0.3 Wound Description Classification: Full Thickness Without Exposed Support Structu Wound Margin: Distinct, outline attached Exudate Amount: Medium Exudate Type: Serosanguineous Exudate Color: red, brown res Foul Odor After Cleansing: No Slough/Fibrino Yes Wound Bed Granulation Amount: Large (67-100%) Exposed Structure Granulation  Quality: Red, Pink Fascia Exposed: No Necrotic Amount: Small (1-33%) Fat Layer (Subcutaneous Tissue) Exposed: Yes Necrotic Quality: Adherent Slough Tendon Exposed: No Muscle Exposed: No Joint Exposed: No Bone Exposed: No Electronic Signature(s) Signed: 03/26/2021 6:25:59 PM By: Baruch Gouty RN, BSN Signed: 03/30/2021 2:05:18 PM By: Madison Hurst RN, BSN Previous Signature: 03/26/2021 1:34:24 PM Version By: Sandre Kitty Entered By: Baruch Gouty on 03/26/2021 13:41:09 -------------------------------------------------------------------------------- Vitals Details Patient Name: Date of Service: Madison Corporal, [place] Madison P. 03/26/2021 1:45 PM Medical Record Number: [id number] Patient Account Number: [id number] Date of Birth/Sex: Treating RN: [birth year] (71 y.o. Madison Collins Primary Care Subrina Vecchiarelli: Madison Collins Other Clinician: Referring Adelyne Marchese: Treating Solene Hereford/Extender: Madison Collins in Treatment: 2 Vital Signs Time Taken: 13:33 Temperature (F): 97.8 Height (in): 60 Pulse (bpm): 81 Weight (lbs): 160 Respiratory Rate (breaths/min): 16 Body Mass Index (BMI): 31.2 Blood Pressure (mmHg): 126/70 Reference Range: 80 - 120 mg / dl Electronic Signature(s) Signed: 03/26/2021 1:34:24 PM By: Sandre Kitty Entered By: Sandre Kitty on 03/26/2021 13:33:35

## 2021-04-02 ENCOUNTER — Encounter (HOSPITAL_BASED_OUTPATIENT_CLINIC_OR_DEPARTMENT_OTHER): Payer: PPO | Admitting: Internal Medicine

## 2021-04-07 ENCOUNTER — Other Ambulatory Visit: Payer: Self-pay

## 2021-04-07 ENCOUNTER — Encounter (HOSPITAL_BASED_OUTPATIENT_CLINIC_OR_DEPARTMENT_OTHER): Payer: PPO | Admitting: Internal Medicine

## 2021-04-07 DIAGNOSIS — M65062 Abscess of tendon sheath, left lower leg: Secondary | ICD-10-CM | POA: Diagnosis not present

## 2021-04-07 DIAGNOSIS — L97828 Non-pressure chronic ulcer of other part of left lower leg with other specified severity: Secondary | ICD-10-CM | POA: Diagnosis not present

## 2021-04-07 DIAGNOSIS — L97822 Non-pressure chronic ulcer of other part of left lower leg with fat layer exposed: Secondary | ICD-10-CM | POA: Diagnosis not present

## 2021-04-07 NOTE — Progress Notes (Signed)
NATOSHIA, SOUTER ([id number]) Visit Report for 04/07/2021 Arrival Information Details Patient Name: Date of Service: Madison Collins, [name] 04/07/2021 12:30 PM Medical Record Number: [id number] Patient Account Number: [id number] Date of Birth/Sex: Treating RN: [birth year] (71 y.o. Madison Collins Primary Care Teighlor Korson: Jacolyn Reedy Other Clinician: Referring Laconda Basich: Treating Elese Rane/Extender: Arline Asp in Treatment: 3 Visit Information History Since Last Visit Added or deleted any medications: No Patient Arrived: Ambulatory Any new allergies or adverse reactions: No Arrival Time: 12:51 Had a fall or experienced change in No Accompanied By: self activities of daily living that may affect Transfer Assistance: None risk of falls: Patient Identification Verified: Yes Signs or symptoms of abuse/neglect since last visito No Patient Requires Transmission-Based Precautions: No Hospitalized since last visit: No Patient Has Alerts: No Implantable device outside of the clinic excluding No cellular tissue based products placed in the center since last visit: Has Dressing in Place as Prescribed: Yes Pain Present Now: No Electronic Signature(s) Signed: 04/07/2021 3:14:36 PM By: Dellie Catholic RN Entered By: Dellie Catholic on 04/07/2021 12:51:41 -------------------------------------------------------------------------------- Clinic Level of Care Assessment Details Patient Name: Date of ServiceMarlyn Collins, [name] 04/07/2021 12:30 PM Medical Record Number: [id number] Patient Account Number: [id number] Date of Birth/Sex: Treating RN: Nov 29, 1950 (71 y.o. Madison Collins Primary Care Zalman Hull: Jacolyn Reedy Other Clinician: Referring Cleon Thoma: Treating Smt. Loder/Extender: Arline Asp in Treatment: 3 Clinic Level of Care Assessment Items TOOL 4 Quantity Score X- 1 0 Use when only an EandM is performed on FOLLOW-UP visit ASSESSMENTS -  Nursing Assessment / Reassessment X- 1 10 Reassessment of Co-morbidities (includes updates in patient status) X- 1 5 Reassessment of Adherence to Treatment Plan ASSESSMENTS - Wound and Skin A ssessment / Reassessment X - Simple Wound Assessment / Reassessment - one wound 1 5 []  - 0 Complex Wound Assessment / Reassessment - multiple wounds []  - 0 Dermatologic / Skin Assessment (not related to wound area) ASSESSMENTS - Focused Assessment []  - 0 Circumferential Edema Measurements - multi extremities []  - 0 Nutritional Assessment / Counseling / Intervention []  - 0 Lower Extremity Assessment (monofilament, tuning fork, pulses) []  - 0 Peripheral Arterial Disease Assessment (using hand held doppler) ASSESSMENTS - Ostomy and/or Continence Assessment and Care []  - 0 Incontinence Assessment and Management []  - 0 Ostomy Care Assessment and Management (repouching, etc.) PROCESS - Coordination of Care X - Simple Patient / Family Education for ongoing care 1 15 []  - 0 Complex (extensive) Patient / Family Education for ongoing care X- 1 10 Staff obtains Programmer, systems, Records, T Results / Process Orders est X- 1 10 Staff telephones HHA, Nursing Homes / Clarify orders / etc []  - 0 Routine Transfer to another Facility (non-emergent condition) []  - 0 Routine Hospital Admission (non-emergent condition) []  - 0 New Admissions / Biomedical engineer / Ordering NPWT Apligraf, etc. , []  - 0 Emergency Hospital Admission (emergent condition) []  - 0 Simple Discharge Coordination []  - 0 Complex (extensive) Discharge Coordination PROCESS - Special Needs []  - 0 Pediatric / Minor Patient Management []  - 0 Isolation Patient Management []  - 0 Hearing / Language / Visual special needs []  - 0 Assessment of Community assistance (transportation, D/C planning, etc.) []  - 0 Additional assistance / Altered mentation []  - 0 Support Surface(s) Assessment (bed, cushion, seat, etc.) INTERVENTIONS -  Wound Cleansing / Measurement X - Simple Wound Cleansing - one wound 1 5 []  - 0 Complex Wound Cleansing - multiple wounds X- 1  5 Wound Imaging (photographs - any number of wounds) []  - 0 Wound Tracing (instead of photographs) X- 1 5 Simple Wound Measurement - one wound []  - 0 Complex Wound Measurement - multiple wounds INTERVENTIONS - Wound Dressings X - Small Wound Dressing one or multiple wounds 1 10 []  - 0 Medium Wound Dressing one or multiple wounds []  - 0 Large Wound Dressing one or multiple wounds []  - 0 Application of Medications - topical []  - 0 Application of Medications - injection INTERVENTIONS - Miscellaneous []  - 0 External ear exam []  - 0 Specimen Collection (cultures, biopsies, blood, body fluids, etc.) []  - 0 Specimen(s) / Culture(s) sent or taken to Lab for analysis []  - 0 Patient Transfer (multiple staff / Civil Service fast streamer / Similar devices) []  - 0 Simple Staple / Suture removal (25 or less) []  - 0 Complex Staple / Suture removal (26 or more) []  - 0 Hypo / Hyperglycemic Management (close monitor of Blood Glucose) []  - 0 Ankle / Brachial Index (ABI) - do not check if billed separately X- 1 5 Vital Signs Has the patient been seen at the hospital within the last three years: Yes Total Score: 85 Level Of Care: New/Established - Level 3 Electronic Signature(s) Signed: 04/07/2021 3:14:36 PM By: Dellie Catholic RN Entered By: Dellie Catholic on 04/07/2021 15:12:44 -------------------------------------------------------------------------------- Encounter Discharge Information Details Patient Name: Date of Service: Madison Corporal, PA [name] 04/07/2021 12:30 PM Medical Record Number: [id number] Patient Account Number: [id number] Date of Birth/Sex: Treating RN: January 19, 1951 (71 y.o. Madison Collins Primary Care Randell Detter: Jacolyn Reedy Other Clinician: Referring Tyese Finken: Treating Drianna Chandran/Extender: Arline Asp in Treatment: 3 Encounter  Discharge Information Items Discharge Condition: Stable Ambulatory Status: Ambulatory Discharge Destination: Home Transportation: Private Auto Accompanied By: self Schedule Follow-up Appointment: Yes Clinical Summary of Care: Patient Declined Electronic Signature(s) Signed: 04/07/2021 3:14:36 PM By: Dellie Catholic RN Entered By: Dellie Catholic on 04/07/2021 15:13:56 -------------------------------------------------------------------------------- Lower Extremity Assessment Details Patient Name: Date of Service: Madison Collins, [name] 04/07/2021 12:30 PM Medical Record Number: [id number] Patient Account Number: [id number] Date of Birth/Sex: Treating RN: July 26, 1950 (71 y.o. Madison Collins Primary Care Chinara Hertzberg: Jacolyn Reedy Other Clinician: Referring Godson Pollan: Treating Toney Difatta/Extender: Arline Asp in Treatment: 3 Edema Assessment Assessed: Shirlyn Goltz: No] Patrice Paradise: No] [Left: Edema] [Right: :] Calf Left: Right: Point of Measurement: From Medial Instep 32 cm Knee To Floor Left: Right: From Medial Instep 23 cm Electronic Signature(s) Signed: 04/07/2021 3:14:36 PM By: Dellie Catholic RN Entered By: Dellie Catholic on 04/07/2021 12:54:18 -------------------------------------------------------------------------------- Multi Wound Chart Details Patient Name: Date of Service: Madison Corporal, PA [name] 04/07/2021 12:30 PM Medical Record Number: [id number] Patient Account Number: [id number] Date of Birth/Sex: Treating RN: [birth year] (71 y.o. Madison Collins Primary Care Ammar Moffatt: Jacolyn Reedy Other Clinician: Referring Myra Weng: Treating Kerrilyn Azbill/Extender: Arline Asp in Treatment: 3 Vital Signs Height(in): 60 Pulse(bpm): 87 Weight(lbs): 160 Blood Pressure(mmHg): 138/72 Body Mass Index(BMI): 31.2 Temperature(F): 98.2 Respiratory Rate(breaths/min): 16 Photos: [N/A:N/A] Left, Distal Knee N/A N/A Wound Location: Trauma N/A  N/A Wounding Event: Abscess N/A N/A Primary Etiology: Cataracts, Hypertension, Hepatitis C, N/A N/A Comorbid History: Osteoarthritis, Confinement Anxiety 02/24/2021 N/A N/A Date Acquired: 3 N/A N/A Weeks of Treatment: Open N/A N/A Wound Status: No N/A N/A Wound Recurrence: 1.4x2.2x0.2 N/A N/A Measurements L x W x D (cm) 2.419 N/A N/A A (cm) : rea 0.484 N/A N/A Volume (cm) : 81.90% N/A N/A % Reduction in Area: 81.90% N/A N/A %  Reduction in Volume: Full Thickness Without Exposed N/A N/A Classification: Support Structures Medium N/A N/A Exudate Amount: Serosanguineous N/A N/A Exudate Type: red, Collins N/A N/A Exudate Color: Distinct, outline attached N/A N/A Wound Margin: Large (67-100%) N/A N/A Granulation Amount: Red, Pink N/A N/A Granulation Quality: Small (1-33%) N/A N/A Necrotic Amount: Fat Layer (Subcutaneous Tissue): Yes N/A N/A Exposed Structures: Fascia: No Tendon: No Muscle: No Joint: No Bone: No None N/A N/A Epithelialization: Treatment Notes Electronic Signature(s) Signed: 04/07/2021 3:14:36 PM By: Dellie Catholic RN Signed: 04/07/2021 4:15:21 PM By: Linton Ham MD Entered By: Linton Ham on 04/07/2021 13:07:46 -------------------------------------------------------------------------------- Multi-Disciplinary Care Plan Details Patient Name: Date of Service: [facility], [facility] [name] 04/07/2021 12:30 PM Medical Record Number: [id number] Patient Account Number: [id number] Date of Birth/Sex: Treating RN: 09/24/1950 (71 y.o. Madison Collins Primary Care Loyal Rudy: Jacolyn Reedy Other Clinician: Referring Wanell Lorenzi: Treating Shellie Rogoff/Extender: Arline Asp in Treatment: 3 Multidisciplinary Care Plan reviewed with physician Active Inactive Wound/Skin Impairment Nursing Diagnoses: Impaired tissue integrity Goals: Patient/caregiver will verbalize understanding of skin care regimen Date Initiated: 03/18/2021 Target  Resolution Date: 04/10/2021 Goal Status: Active Ulcer/skin breakdown will have a volume reduction of 30% by week 4 Date Initiated: 03/18/2021 Target Resolution Date: 04/10/2021 Goal Status: Active Interventions: Assess patient/caregiver ability to obtain necessary supplies Assess patient/caregiver ability to perform ulcer/skin care regimen upon admission and as needed Assess ulceration(s) every visit Provide education on ulcer and skin care Treatment Activities: Referred to DME [place] [facility] for dressing supplies : 03/11/2021 Skin care regimen initiated : 03/11/2021 Notes: Electronic Signature(s) Signed: 04/07/2021 3:14:36 PM By: Dellie Catholic RN Entered By: Dellie Catholic on 04/07/2021 13:29:11 -------------------------------------------------------------------------------- Pain Assessment Details Patient Name: Date of Service: Madison Corporal, PA [name] 04/07/2021 12:30 PM Medical Record Number: [id number] Patient Account Number: [id number] Date of Birth/Sex: Treating RN: February 12, 1951 (71 y.o. Madison Collins Primary Care Glenetta Kiger: Jacolyn Reedy Other Clinician: Referring Akeiba Axelson: Treating Kalei Mckillop/Extender: Arline Asp in Treatment: 3 Active Problems Location of Pain Severity and Description of Pain Patient Has Paino No Site Locations Pain Management and Medication Current Pain Management: Electronic Signature(s) Signed: 04/07/2021 3:14:36 PM By: Dellie Catholic RN Entered By: Dellie Catholic on 04/07/2021 12:52:35 -------------------------------------------------------------------------------- Patient/Caregiver Education Details Patient Name: Date of Service: Madison Corporal, PA [name] 2/14/2023andnbsp12:30 PM Medical Record Number: [id number] Patient Account Number: [id number] Date of Birth/Gender: Treating RN: 08/08/1950 (71 y.o. Madison Collins Primary Care Physician: Jacolyn Reedy Other Clinician: Referring Physician: Treating Physician/Extender:  Arline Asp in Treatment: 3 Education Assessment Education Provided To: Patient Education Topics Provided Wound/Skin Impairment: Methods: Explain/Verbal Responses: Return demonstration correctly Electronic Signature(s) Signed: 04/07/2021 3:14:36 PM By: Dellie Catholic RN Entered By: Dellie Catholic on 04/07/2021 15:07:47 -------------------------------------------------------------------------------- Wound Assessment Details Patient Name: Date of Service: Madison Corporal, PA [name] 04/07/2021 12:30 PM Medical Record Number: [id number] Patient Account Number: [id number] Date of Birth/Sex: Treating RN: February 22, 1951 (71 y.o. Madison Collins Primary Care Sahej Schrieber: Jacolyn Reedy Other Clinician: Referring Telisha Zawadzki: Treating Zakya Halabi/Extender: Arline Asp in Treatment: 3 Wound Status Wound Number: 1 Primary Abscess Etiology: Wound Location: Left, Distal Knee Wound Status: Open Wounding Event: Trauma Comorbid Cataracts, Hypertension, Hepatitis C, Osteoarthritis, Date Acquired: 02/24/2021 History: Confinement Anxiety Weeks Of Treatment: 3 Clustered Wound: No Photos Wound Measurements Length: (cm) 1.4 Width: (cm) 2.2 Depth: (cm) 0.2 Area: (cm) 2.419 Volume: (cm) 0.484 % Reduction in Area: 81.9% % Reduction in Volume: 81.9% Epithelialization: None Tunneling: No Undermining: No Wound Description  Classification: Full Thickness Without Exposed Support Structures Wound Margin: Distinct, outline attached Exudate Amount: Medium Exudate Type: Serosanguineous Exudate Color: red, Collins Foul Odor After Cleansing: No Slough/Fibrino Yes Wound Bed Granulation Amount: Large (67-100%) Exposed Structure Granulation Quality: Red, Pink Fascia Exposed: No Necrotic Amount: Small (1-33%) Fat Layer (Subcutaneous Tissue) Exposed: Yes Necrotic Quality: Adherent Slough Tendon Exposed: No Muscle Exposed: No Joint Exposed: No Bone Exposed:  No Treatment Notes Wound #1 (Knee) Wound Laterality: Left, Distal Cleanser Normal Saline Discharge Instruction: Cleanse the wound with Normal Saline prior to applying a clean dressing using gauze sponges, not tissue or cotton balls. Soap and Water Discharge Instruction: May shower and wash wound with dial antibacterial soap and water prior to dressing change. Peri-Wound Care Topical Primary Dressing Promogran Prisma Matrix, 4.34 (sq in) (silver collagen) Discharge Instruction: Moisten collagen with saline or hydrogel Secondary Dressing Woven Gauze Sponge, Non-Sterile 4x4 in Discharge Instruction: Apply over primary dressing as directed. ABD Pad, 8x10 Discharge Instruction: Apply over primary dressing as directed. Secured With [employer], 4.5x3.1 (in/yd) Discharge Instruction: Secure with Kerlix as directed. 83M Medipore Soft Cloth Surgical T 2x10 (in/yd) ape Discharge Instruction: Secure with tape as directed. Compression Wrap Compression Stockings Add-Ons Electronic Signature(s) Signed: 04/07/2021 3:14:36 PM By: Dellie Catholic RN Entered By: Dellie Catholic on 04/07/2021 12:55:44 -------------------------------------------------------------------------------- Vitals Details Patient Name: Date of Service: Madison Corporal, PA [name] 04/07/2021 12:30 PM Medical Record Number: [id number] Patient Account Number: [id number] Date of Birth/Sex: Treating RN: January 30, 1951 (71 y.o. Madison Collins Primary Care Aston Lawhorn: Jacolyn Reedy Other Clinician: Referring Serayah Yazdani: Treating Elloise Roark/Extender: Arline Asp in Treatment: 3 Vital Signs Time Taken: 12:50 Temperature (F): 98.2 Height (in): 60 Pulse (bpm): 83 Weight (lbs): 160 Respiratory Rate (breaths/min): 16 Body Mass Index (BMI): 31.2 Blood Pressure (mmHg): 138/72 Reference Range: 80 - 120 mg / dl Electronic Signature(s) Signed: 04/07/2021 3:14:36 PM By: Dellie Catholic RN Entered By: Dellie Catholic on 04/07/2021 12:52:19

## 2021-04-07 NOTE — Progress Notes (Signed)
Madison, Collins ([id number]) Visit Report for 04/07/2021 HPI Details Patient Name: Date of Service: Madison Collins, [place] Madison P. 04/07/2021 12:30 PM Medical Record Number: [id number] Patient Account Number: [id number] Date of Birth/Sex: Treating RN: December 30, 1950 (71 y.o. Madison Collins Primary Care Provider: Jacolyn Collins Other Clinician: Referring Provider: Treating Provider/Extender: Madison Collins in Treatment: 3 History of Present Illness HPI Description: ADMISSION 03/11/2021 This is a 71 year old woman who was problem began sometime after Christmas when she had a fell at work down stairs. She had an abrasion on her left knee. She was seen in the ER on 02/24/2021 and x-ray was negative. I do not believe she was given antibiotics. Sometime after this she was also seen by orthopedics perhaps on 7 January although I do not see an note here. After this she developed a rapidly increasing swelling over the left infrapatellar tendon area very painful. She also has a new wound on the left distal thigh above the patella. She is not on any antibiotics. Past medical history includes chronic pain, hypertension, depression, variably type 2 diabetes or prediabetic, hypercholesterolemia and osteoarthritis of the knees 1/25; only the abscess of the infrapatellar tendon on the left as left. The area that was on the thigh and more distally on the lower leg are healed. Culture of this grew MRSA and some strep. I had her on doxycycline and this is worked well 2/2; infrapatellar tendon on the left only wound that is remaining. This was initially an abscess. She has been using silver alginate. I gave her a course of doxycycline for MRSA and strep this is worked well. 2/14; infrapatellar tendon. Dimensions are down nicely but we have been using silver collagen. Electronic Signature(s) Signed: 04/07/2021 4:15:21 PM By: Linton Ham MD Entered By: Linton Ham on 04/07/2021  13:08:15 -------------------------------------------------------------------------------- Physical Exam Details Patient Name: Date of Service: Madison Corporal, [place] Madison P. 04/07/2021 12:30 PM Medical Record Number: [id number] Patient Account Number: [id number] Date of Birth/Sex: Treating RN: May 15, 1950 (71 y.o. Madison Collins Primary Care Provider: Jacolyn Collins Other Clinician: Referring Provider: Treating Provider/Extender: Madison Collins in Treatment: 3 Constitutional Sitting or standing Blood Pressure is within target range for patient.. Pulse regular and within target range for patient.Marland Kitchen Respirations regular, non-labored and within target range.. Temperature is normal and within the target range for the patient.Marland Kitchen Appears in no distress. Notes Wound exam; the area of the infra. Patellar tendon has healthy granulated surface. No undermining. Dimensions have come down nicely. No evidence of surrounding infection Electronic Signature(s) Signed: 04/07/2021 4:15:21 PM By: Linton Ham MD Entered By: Linton Ham on 04/07/2021 13:08:50 -------------------------------------------------------------------------------- Physician Orders Details Patient Name: Date of Service: Madison Corporal, [place] Madison P. 04/07/2021 12:30 PM Medical Record Number: [id number] Patient Account Number: [id number] Date of Birth/Sex: Treating RN: 1951/01/21 (71 y.o. Madison Collins Primary Care Provider: Jacolyn Collins Other Clinician: Referring Provider: Treating Provider/Extender: Madison Collins in Treatment: 3 Verbal / Phone Orders: No Diagnosis Coding Follow-up Appointments ppointment in 2 weeks. - Dr Dellia Nims Return A Other: - [employer] Bathing/ Shower/ Hygiene May shower and wash wound with soap and water. Edema Control - Lymphedema / SCD / Other Left Lower Extremity Elevate legs to the level of the heart or above for 30 minutes daily and/or when sitting, a frequency  of: - throughout the day Wound Treatment Wound #1 - Knee Wound Laterality: Left, Distal Cleanser: Normal Saline 1 x Per Day/15 Days Discharge Instructions: Cleanse  the wound with Normal Saline prior to applying a clean dressing using gauze sponges, not tissue or cotton balls. Cleanser: Soap and Water 1 x Per Day/15 Days Discharge Instructions: May shower and wash wound with dial antibacterial soap and water prior to dressing change. Prim Dressing: Promogran Prisma Matrix, 4.34 (sq in) (silver collagen) 1 x Per Day/15 Days ary Discharge Instructions: Moisten collagen with saline or hydrogel Secondary Dressing: Woven Gauze Sponge, Non-Sterile 4x4 in 1 x Per Day/15 Days Discharge Instructions: Apply over primary dressing as directed. Secondary Dressing: ABD Pad, 8x10 1 x Per Day/15 Days Discharge Instructions: Apply over primary dressing as directed. Secured With: [employer], 4.5x3.1 (in/yd) 1 x Per Day/15 Days Discharge Instructions: Secure with Kerlix as directed. Secured With: 55M Medipore Public affairs consultant Surgical T 2x10 (in/yd) 1 x Per Day/15 Days ape Discharge Instructions: Secure with tape as directed. Electronic Signature(s) Signed: 04/07/2021 3:14:36 PM By: Dellie Catholic RN Signed: 04/07/2021 4:15:21 PM By: Linton Ham MD Entered By: Dellie Catholic on 04/07/2021 13:08:10 -------------------------------------------------------------------------------- Problem List Details Patient Name: Date of Service: Madison Corporal, [place] Madison P. 04/07/2021 12:30 PM Medical Record Number: [id number] Patient Account Number: [id number] Date of Birth/Sex: Treating RN: 04-Oct-1950 (71 y.o. 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 04/07/2021 12:30 PM Medical Record Number: [id number] Patient Account Number: [id number] Date of Birth/Sex: Treating RN: August 02, 1950 (71 y.o. Madison Collins Primary Care Provider: Jacolyn Collins Other Clinician: Referring Provider: Treating Provider/Extender: Madison Collins in Treatment: 3 Subjective History of Present Illness (HPI) ADMISSION 03/11/2021 This is a 71 year old woman who was problem began sometime after Christmas when she had a fell at work down stairs. She had an abrasion on her left knee. She was seen in the ER on 02/24/2021 and x-ray was negative. I do not believe she was given antibiotics. Sometime after this she was also seen by orthopedics perhaps on 7 January although I do not see an note here. After this she developed a rapidly increasing swelling over the left infrapatellar tendon area very painful. She also has a new wound on the left distal thigh above the patella. She is not on any antibiotics. Past medical history includes chronic pain, hypertension, depression, variably type 2 diabetes or prediabetic, hypercholesterolemia and osteoarthritis of the knees 1/25; only the abscess of the infrapatellar tendon on the left as left. The area that was on the thigh and more distally on the lower leg are healed. Culture of this grew MRSA and some strep. I had her on doxycycline and this is worked well 2/2; infrapatellar tendon  on the left only wound that is remaining. This was initially an abscess. She has been using silver alginate. I gave her a course of doxycycline for MRSA and strep this is worked well. 2/14; infrapatellar tendon. Dimensions are down nicely but we have been using silver collagen. Objective Constitutional Sitting or  standing Blood Pressure is within target range for patient.. Pulse regular and within target range for patient.Marland Kitchen Respirations regular, non-labored and within target range.. Temperature is normal and within the target range for the patient.Marland Kitchen Appears in no distress. Vitals Time Taken: 12:50 PM, Height: 60 in, Weight: 160 lbs, BMI: 31.2, Temperature: 98.2 F, Pulse: 83 bpm, Respiratory Rate: 16 breaths/min, Blood Pressure: 138/72 mmHg. General Notes: Wound exam; the area of the infra. Patellar tendon has healthy granulated surface. No undermining. Dimensions have come down nicely. No evidence of surrounding infection Integumentary (Hair, Skin) Wound #1 status is Open. Original cause of wound was Trauma. The date acquired was: 02/24/2021. The wound has been in treatment 3 weeks. The wound is located on the Left,Distal Knee. The wound measures 1.4cm length x 2.2cm width x 0.2cm depth; 2.419cm^2 area and 0.484cm^3 volume. There is Fat Layer (Subcutaneous Tissue) exposed. There is no tunneling or undermining noted. There is a medium amount of serosanguineous drainage noted. The wound margin is distinct with the outline attached to the wound base. There is large (67-100%) red, pink granulation within the wound bed. There is a small (1-33%) amount of necrotic tissue within the wound bed including Adherent Slough. 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 - Dr Dellia Nims Other: - [facility] Bathing/ Shower/ Hygiene: May shower and wash wound with soap and water. Edema Control - Lymphedema / SCD / Other: Elevate legs to the level of the heart or above for 30 minutes daily and/or when sitting, a frequency of: - throughout the day WOUND #1: - Knee Wound Laterality: Left, Distal Cleanser: Normal Saline 1 x Per Day/15 Days Discharge Instructions: Cleanse the wound with Normal Saline prior to applying a clean dressing using gauze sponges, not tissue or cotton balls. Cleanser: Soap and Water 1 x Per Day/15 Days Discharge Instructions: May shower and wash wound with dial antibacterial soap and water prior to dressing change. Prim Dressing: Promogran Prisma Matrix, 4.34 (sq in) (silver collagen) 1 x Per Day/15 Days ary Discharge Instructions: Moisten collagen with saline or hydrogel Secondary Dressing: Woven Gauze Sponge, Non-Sterile 4x4 in 1 x Per Day/15 Days Discharge Instructions: Apply over primary dressing as directed. Secondary Dressing: ABD Pad, 8x10 1 x Per Day/15 Days Discharge Instructions: Apply over primary dressing as directed. Secured With: [employer], 4.5x3.1 (in/yd) 1 x Per Day/15 Days Discharge Instructions: Secure with Kerlix as directed. Secured With: 84M Medipore Public affairs consultant Surgical T 2x10 (in/yd) 1 x Per Day/15 Days ape Discharge Instructions: Secure with tape as directed. 1. Still using the moistened silver collagen/ABD/gauze. This appears to be coming down nicely. I thought we might have to look at an advanced treatment product but it is looking like that may not be necessary Electronic Signature(s) Signed: 04/07/2021 4:15:21 PM By: Linton Ham MD Entered By: Linton Ham on 04/07/2021 13:09:38 -------------------------------------------------------------------------------- SuperBill Details Patient Name: Date of Service: Madison Corporal, [place] Madison P. 04/07/2021 Medical Record Number: [id number] Patient Account  Number: [id number] Date of Birth/Sex: Treating RN: 1950/03/06 (71 y.o. 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 Facility Procedures CPT4 Code: [id number] Description: 99213 - WOUND CARE VISIT-LEV 3 EST PT Modifier: Quantity: 1 Physician Procedures : CPT4 Code Description Modifier [id number] - WC PHYS LEVEL 3 - EST PT ICD-10 Diagnosis Description L97.828 Non-pressure chronic ulcer of other part of left lower leg with other specified severity M65.062 Abscess of tendon sheath, left lower leg  L97.121 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

## 2021-04-13 ENCOUNTER — Ambulatory Visit: Payer: PPO | Attending: Internal Medicine

## 2021-04-13 ENCOUNTER — Other Ambulatory Visit (HOSPITAL_BASED_OUTPATIENT_CLINIC_OR_DEPARTMENT_OTHER): Payer: Self-pay

## 2021-04-13 DIAGNOSIS — Z23 Encounter for immunization: Secondary | ICD-10-CM

## 2021-04-13 MED ORDER — PFIZER COVID-19 VAC BIVALENT 30 MCG/0.3ML IM SUSP
INTRAMUSCULAR | 0 refills | Status: DC
  Filled 2021-04-13: qty 0.3, 1d supply, fill #0

## 2021-04-13 NOTE — Progress Notes (Signed)
° °  Covid-19 Vaccination [facility]  Name:  Madison Collins    MRN: [id number] DOB: 1950-07-14  04/13/2021  [name] was observed post Covid-19 immunization for 15 minutes without incident. She was provided with Vaccine Information Sheet and instruction to access the V-Safe system.   [name] was instructed to call 911 with any severe reactions post vaccine: Difficulty breathing  Swelling of face and throat  A fast heartbeat  A bad rash all over body  Dizziness and weakness   Immunizations Administered     Name Date Dose VIS Date Route   PFIZER Comrnaty(Gray TOP) Covid-19 Vaccine 04/13/2021  1:00 PM 0.3 mL 10/22/2020 Intramuscular   Manufacturer: [employer] [place]   Lot: [id number]   [place]: [phone]

## 2021-04-16 ENCOUNTER — Other Ambulatory Visit (HOSPITAL_BASED_OUTPATIENT_CLINIC_OR_DEPARTMENT_OTHER): Payer: Self-pay

## 2021-04-17 ENCOUNTER — Other Ambulatory Visit (HOSPITAL_BASED_OUTPATIENT_CLINIC_OR_DEPARTMENT_OTHER): Payer: Self-pay

## 2021-04-17 MED ORDER — SUMATRIPTAN SUCCINATE 100 MG PO TABS
100.0000 mg | ORAL_TABLET | ORAL | 5 refills | Status: DC
  Filled 2021-05-12: qty 9, 30d supply, fill #0
  Filled 2021-06-15: qty 9, 30d supply, fill #1

## 2021-04-17 MED ORDER — LOVASTATIN 40 MG PO TABS: ORAL_TABLET | ORAL | 3 refills | Status: DC

## 2021-04-17 MED ORDER — METFORMIN HCL ER 500 MG PO TB24
ORAL_TABLET | ORAL | 5 refills | Status: DC
  Filled 2021-05-12: qty 60, 30d supply, fill #0
  Filled 2021-06-15: qty 60, 30d supply, fill #1
  Filled 2021-07-22: qty 60, 30d supply, fill #2

## 2021-04-17 MED ORDER — MELOXICAM 15 MG PO TABS: ORAL_TABLET | ORAL | 3 refills | Status: DC

## 2021-04-17 MED ORDER — CHLORTHALIDONE 25 MG PO TABS: ORAL_TABLET | ORAL | 1 refills | Status: DC

## 2021-04-17 MED ORDER — LOSARTAN POTASSIUM 100 MG PO TABS: ORAL_TABLET | ORAL | 3 refills | Status: DC

## 2021-04-21 ENCOUNTER — Other Ambulatory Visit: Payer: Self-pay

## 2021-04-21 ENCOUNTER — Encounter (HOSPITAL_BASED_OUTPATIENT_CLINIC_OR_DEPARTMENT_OTHER): Payer: PPO | Admitting: Internal Medicine

## 2021-04-21 DIAGNOSIS — L97822 Non-pressure chronic ulcer of other part of left lower leg with fat layer exposed: Secondary | ICD-10-CM | POA: Diagnosis not present

## 2021-04-21 DIAGNOSIS — M65062 Abscess of tendon sheath, left lower leg: Secondary | ICD-10-CM | POA: Diagnosis not present

## 2021-04-21 DIAGNOSIS — L97828 Non-pressure chronic ulcer of other part of left lower leg with other specified severity: Secondary | ICD-10-CM | POA: Diagnosis not present

## 2021-04-21 DIAGNOSIS — I10 Essential (primary) hypertension: Secondary | ICD-10-CM | POA: Diagnosis not present

## 2021-04-21 NOTE — Progress Notes (Signed)
Madison, Collins ([id number]) Visit Report for 04/21/2021 Arrival Information Details Patient Name: Date of Service: Madison Collins, [place] [name] 04/21/2021 1:00 PM Medical Record Number: [id number] Patient Account Number: [id number] Date of Birth/Sex: Treating RN: December 17, 1950 (71 y.o. Madison Collins Primary Care Ulric Salzman: Jacolyn Reedy Other Clinician: Referring Inessa Wardrop: Treating Stevee Valenta/Extender: Arline Asp in Treatment: 5 Visit Information History Since Last Visit Added or deleted any medications: No Patient Arrived: Ambulatory Any new allergies or adverse reactions: No Arrival Time: 13:18 Had a fall or experienced change in No Accompanied By: self activities of daily living that may affect Transfer Assistance: None risk of falls: Patient Identification Verified: Yes Signs or symptoms of abuse/neglect since last visito No Patient Requires Transmission-Based Precautions: No Hospitalized since last visit: No Patient Has Alerts: No Implantable device outside of the clinic excluding No cellular tissue based products placed in the center since last visit: Has Dressing in Place as Prescribed: Yes Pain Present Now: No Electronic Signature(s) Signed: 04/21/2021 6:17:08 PM By: Dellie Catholic RN Entered By: Dellie Catholic on 04/21/2021 13:18:27 -------------------------------------------------------------------------------- Clinic Level of Care Assessment Details Patient Name: Date of Service: Madison Collins, [place] [name] 04/21/2021 1:00 PM Medical Record Number: [id number] Patient Account Number: [id number] Date of Birth/Sex: Treating RN: 1951/01/15 (71 y.o. Madison Collins Primary Care Valoria Tamburri: Jacolyn Reedy Other Clinician: Referring Chanz Cahall: Treating Sreshta Cressler/Extender: Arline Asp in Treatment: 5 Clinic Level of Care Assessment Items TOOL 4 Quantity Score X- 1 0 Use when only an EandM is performed on FOLLOW-UP visit ASSESSMENTS -  Nursing Assessment / Reassessment X- 1 10 Reassessment of Co-morbidities (includes updates in patient status) X- 1 5 Reassessment of Adherence to Treatment Plan ASSESSMENTS - Wound and Skin A ssessment / Reassessment X - Simple Wound Assessment / Reassessment - one wound 1 5 []  - 0 Complex Wound Assessment / Reassessment - multiple wounds []  - 0 Dermatologic / Skin Assessment (not related to wound area) ASSESSMENTS - Focused Assessment []  - 0 Circumferential Edema Measurements - multi extremities []  - 0 Nutritional Assessment / Counseling / Intervention []  - 0 Lower Extremity Assessment (monofilament, tuning fork, pulses) []  - 0 Peripheral Arterial Disease Assessment (using hand held doppler) ASSESSMENTS - Ostomy and/or Continence Assessment and Care []  - 0 Incontinence Assessment and Management []  - 0 Ostomy Care Assessment and Management (repouching, etc.) PROCESS - Coordination of Care X - Simple Patient / Family Education for ongoing care 1 15 []  - 0 Complex (extensive) Patient / Family Education for ongoing care X- 1 10 Staff obtains Programmer, systems, Records, T Results / Process Orders est X- 1 10 Staff telephones HHA, Nursing Homes / Clarify orders / etc []  - 0 Routine Transfer to another Facility (non-emergent condition) []  - 0 Routine Hospital Admission (non-emergent condition) []  - 0 New Admissions / Biomedical engineer / Ordering NPWT Apligraf, etc. , []  - 0 Emergency Hospital Admission (emergent condition) X- 1 10 Simple Discharge Coordination []  - 0 Complex (extensive) Discharge Coordination PROCESS - Special Needs []  - 0 Pediatric / Minor Patient Management []  - 0 Isolation Patient Management []  - 0 Hearing / Language / Visual special needs []  - 0 Assessment of Community assistance (transportation, D/C planning, etc.) []  - 0 Additional assistance / Altered mentation []  - 0 Support Surface(s) Assessment (bed, cushion, seat, etc.) INTERVENTIONS -  Wound Cleansing / Measurement X - Simple Wound Cleansing - one wound 1 5 []  - 0 Complex Wound Cleansing - multiple wounds X- 1  5 Wound Imaging (photographs - any number of wounds) []  - 0 Wound Tracing (instead of photographs) X- 1 5 Simple Wound Measurement - one wound []  - 0 Complex Wound Measurement - multiple wounds INTERVENTIONS - Wound Dressings X - Small Wound Dressing one or multiple wounds 1 10 []  - 0 Medium Wound Dressing one or multiple wounds []  - 0 Large Wound Dressing one or multiple wounds []  - 0 Application of Medications - topical []  - 0 Application of Medications - injection INTERVENTIONS - Miscellaneous []  - 0 External ear exam []  - 0 Specimen Collection (cultures, biopsies, blood, body fluids, etc.) []  - 0 Specimen(s) / Culture(s) sent or taken to Lab for analysis []  - 0 Patient Transfer (multiple staff / Harrel Lemon Lift / Similar devices) []  - 0 Simple Staple / Suture removal (25 or less) []  - 0 Complex Staple / Suture removal (26 or more) []  - 0 Hypo / Hyperglycemic Management (close monitor of Blood Glucose) []  - 0 Ankle / Brachial Index (ABI) - do not check if billed separately X- 1 5 Vital Signs Has the patient been seen at the hospital within the last three years: Yes Total Score: 95 Level Of Care: New/Established - Level 3 Electronic Signature(s) Signed: 04/21/2021 6:17:08 PM By: Dellie Catholic RN Entered By: Dellie Catholic on 04/21/2021 18:00:26 -------------------------------------------------------------------------------- Encounter Discharge Information Details Patient Name: Date of Service: Madison Collins, Madison Collins [name] 04/21/2021 1:00 PM Medical Record Number: [id number] Patient Account Number: [id number] Date of Birth/Sex: Treating RN: Nov 18, 1950 (71 y.o. Madison Collins Primary Care Aelyn Stanaland: Jacolyn Reedy Other Clinician: Referring Akeel Reffner: Treating Tecla Mailloux/Extender: Arline Asp in Treatment: 5 Encounter  Discharge Information Items Discharge Condition: Stable Ambulatory Status: Ambulatory Discharge Destination: Home Transportation: Private Auto Accompanied By: self Schedule Follow-up Appointment: Yes Clinical Summary of Care: Patient Declined Electronic Signature(s) Signed: 04/21/2021 6:17:08 PM By: Dellie Catholic RN Entered By: Dellie Catholic on 04/21/2021 18:01:22 -------------------------------------------------------------------------------- Lower Extremity Assessment Details Patient Name: Date of Service: Madison Collins, [place] [name] 04/21/2021 1:00 PM Medical Record Number: [id number] Patient Account Number: [id number] Date of Birth/Sex: Treating RN: 1950-08-19 (71 y.o. Madison Collins Primary Care Shanaya Schneck: Jacolyn Reedy Other Clinician: Referring Mardie Kellen: Treating Tiger Spieker/Extender: Arline Asp in Treatment: 5 Edema Assessment Assessed: Shirlyn Goltz: No] Patrice Paradise: No] [Left: Edema] [Right: :] Calf Left: Right: Point of Measurement: From Medial Instep 32.3 cm Knee To Floor Left: Right: From Medial Instep 22.6 cm Electronic Signature(s) Signed: 04/21/2021 6:17:08 PM By: Dellie Catholic RN Entered By: Dellie Catholic on 04/21/2021 13:28:31 -------------------------------------------------------------------------------- Multi Wound Chart Details Patient Name: Date of Service: Madison Collins, Madison Collins [name] 04/21/2021 1:00 PM Medical Record Number: [id number] Patient Account Number: [id number] Date of Birth/Sex: Treating RN: 12-19-1950 (71 y.o. Madison Collins Primary Care Audra Kagel: Jacolyn Reedy Other Clinician: Referring Rubens Cranston: Treating Shadavia Dampier/Extender: Arline Asp in Treatment: 5 Vital Signs Height(in): 60 Pulse(bpm): 13 Weight(lbs): 160 Blood Pressure(mmHg): 172/72 Body Mass Index(BMI): 31.2 Temperature(F): 98.1 Respiratory Rate(breaths/min): 16 Photos: [N/A:N/A] Left, Distal Knee N/A N/A Wound Location: Trauma N/A  N/A Wounding Event: Abscess N/A N/A Primary Etiology: Cataracts, Hypertension, Hepatitis C, N/A N/A Comorbid History: Osteoarthritis, Confinement Anxiety 02/24/2021 N/A N/A Date Acquired: 5 N/A N/A Weeks of Treatment: Open N/A N/A Wound Status: No N/A N/A Wound Recurrence: 0.3x0.8x0.1 N/A N/A Measurements L x W x D (cm) 0.188 N/A N/A A (cm) : rea 0.019 N/A N/A Volume (cm) : 98.60% N/A N/A % Reduction in Area: 99.30% N/A N/A %  Reduction in Volume: Full Thickness Without Exposed N/A N/A Classification: Support Structures Medium N/A N/A Exudate Amount: Serosanguineous N/A N/A Exudate Type: red, Collins N/A N/A Exudate Color: Distinct, outline attached N/A N/A Wound Margin: Large (67-100%) N/A N/A Granulation Amount: Red, Pink N/A N/A Granulation Quality: Small (1-33%) N/A N/A Necrotic Amount: Fat Layer (Subcutaneous Tissue): Yes N/A N/A Exposed Structures: Fascia: No Tendon: No Muscle: No Joint: No Bone: No Small (1-33%) N/A N/A Epithelialization: Treatment Notes Electronic Signature(s) Signed: 04/21/2021 4:13:40 PM By: Linton Ham MD Signed: 04/21/2021 6:17:08 PM By: Dellie Catholic RN Entered By: Linton Ham on 04/21/2021 13:52:37 -------------------------------------------------------------------------------- Multi-Disciplinary Care Plan Details Patient Name: Date of Service: [facility], Madison Collins [name] 04/21/2021 1:00 PM Medical Record Number: [id number] Patient Account Number: [id number] Date of Birth/Sex: Treating RN: 1950-07-29 (71 y.o. Madison Collins Primary Care Nabria Nevin: Jacolyn Reedy Other Clinician: Referring Howell Groesbeck: Treating Forever Arechiga/Extender: Arline Asp in Treatment: [place] reviewed with physician Active Inactive Wound/Skin Impairment Nursing Diagnoses: Impaired tissue integrity Goals: Patient/caregiver will verbalize understanding of skin care regimen Date Initiated:  03/18/2021 Target Resolution Date: 04/28/2021 Goal Status: Active Ulcer/skin breakdown will have a volume reduction of 30% by week 4 Date Initiated: 03/18/2021 Target Resolution Date: 04/28/2021 Goal Status: Active Interventions: Assess patient/caregiver ability to obtain necessary supplies Assess patient/caregiver ability to perform ulcer/skin care regimen upon admission and as needed Assess ulceration(s) every visit Provide education on ulcer and skin care Treatment Activities: Referred to DME Naji Mehringer for dressing supplies : 03/11/2021 Skin care regimen initiated : 03/11/2021 Notes: Electronic Signature(s) Signed: 04/21/2021 6:17:08 PM By: Dellie Catholic RN Entered By: Dellie Catholic on 04/21/2021 17:59:14 -------------------------------------------------------------------------------- Pain Assessment Details Patient Name: Date of Service: Madison Collins, Madison Collins [name] 04/21/2021 1:00 PM Medical Record Number: [id number] Patient Account Number: [id number] Date of Birth/Sex: Treating RN: 06/09/1950 (71 y.o. Madison Collins Primary Care Raeanna Soberanes: Jacolyn Reedy Other Clinician: Referring Reyhan Moronta: Treating Izacc Demeyer/Extender: Arline Asp in Treatment: 5 Active Problems Location of Pain Severity and Description of Pain Patient Has Paino No Site Locations Pain Management and Medication Current Pain Management: Electronic Signature(s) Signed: 04/21/2021 6:17:08 PM By: Dellie Catholic RN Entered By: Dellie Catholic on 04/21/2021 13:27:59 -------------------------------------------------------------------------------- Patient/Caregiver Education Details Patient Name: Date of Service: Madison Collins, Madison Collins [name] 2/28/2023andnbsp1:00 PM Medical Record Number: [id number] Patient Account Number: [id number] Date of Birth/Gender: Treating RN: 12-22-1950 (71 y.o. Madison Collins Primary Care Physician: Jacolyn Reedy Other Clinician: Referring Physician: Treating  Physician/Extender: Arline Asp in Treatment: 5 Education Assessment Education Provided To: Patient Education Topics Provided Wound/Skin Impairment: Methods: Explain/Verbal Responses: Return demonstration correctly Electronic Signature(s) Signed: 04/21/2021 6:17:08 PM By: Dellie Catholic RN Entered By: Dellie Catholic on 04/21/2021 17:59:33 -------------------------------------------------------------------------------- Wound Assessment Details Patient Name: Date of Service: Madison Collins, Madison Collins [name] 04/21/2021 1:00 PM Medical Record Number: [id number] Patient Account Number: [id number] Date of Birth/Sex: Treating RN: 1950-07-06 (71 y.o. Madison Collins Primary Care Elexia Friedt: Jacolyn Reedy Other Clinician: Referring Mixtli Reno: Treating Stayce Delancy/Extender: Arline Asp in Treatment: 5 Wound Status Wound Number: 1 Primary Abscess Etiology: Wound Location: Left, Distal Knee Wound Status: Open Wounding Event: Trauma Comorbid Cataracts, Hypertension, Hepatitis C, Osteoarthritis, Date Acquired: 02/24/2021 History: Confinement Anxiety Weeks Of Treatment: 5 Clustered Wound: No Photos Wound Measurements Length: (cm) 0.3 Width: (cm) 0.8 Depth: (cm) 0.1 Area: (cm) 0.188 Volume: (cm) 0.019 % Reduction in Area: 98.6% % Reduction in Volume: 99.3% Epithelialization: Small (1-33%) Tunneling: No Undermining: No  Wound Description Classification: Full Thickness Without Exposed Support Structures Wound Margin: Distinct, outline attached Exudate Amount: Medium Exudate Type: Serosanguineous Exudate Color: red, Collins Foul Odor After Cleansing: No Slough/Fibrino Yes Wound Bed Granulation Amount: Large (67-100%) Exposed Structure Granulation Quality: Red, Pink Fascia Exposed: No Necrotic Amount: Small (1-33%) Fat Layer (Subcutaneous Tissue) Exposed: Yes Necrotic Quality: Adherent Slough Tendon Exposed: No Muscle Exposed: No Joint Exposed:  No Bone Exposed: No Treatment Notes Wound #1 (Knee) Wound Laterality: Left, Distal Cleanser Normal Saline Discharge Instruction: Cleanse the wound with Normal Saline prior to applying a clean dressing using gauze sponges, not tissue or cotton balls. Soap and Water Discharge Instruction: May shower and wash wound with dial antibacterial soap and water prior to dressing change. Peri-Wound Care Topical Primary Dressing Promogran Prisma Matrix, 4.34 (sq in) (silver collagen) Discharge Instruction: Moisten collagen with saline or hydrogel Secondary Dressing Woven Gauze Sponge, Non-Sterile 4x4 in Discharge Instruction: Apply over primary dressing as directed. ABD Pad, 8x10 Discharge Instruction: Apply over primary dressing as directed. Secured With [employer], 4.5x3.1 (in/yd) Discharge Instruction: Secure with Kerlix as directed. 95M Medipore Soft Cloth Surgical T 2x10 (in/yd) ape Discharge Instruction: Secure with tape as directed. Compression Wrap Compression Stockings Add-Ons Electronic Signature(s) Signed: 04/21/2021 6:17:08 PM By: Dellie Catholic RN Entered By: Dellie Catholic on 04/21/2021 13:39:59 -------------------------------------------------------------------------------- Vitals Details Patient Name: Date of Service: Madison Collins, Madison Collins [name] 04/21/2021 1:00 PM Medical Record Number: [id number] Patient Account Number: [id number] Date of Birth/Sex: Treating RN: March 20, 1950 (71 y.o. Madison Collins Primary Care Aedon Deason: Jacolyn Reedy Other Clinician: Referring Reet Scharrer: Treating Era Parr/Extender: Arline Asp in Treatment: 5 Vital Signs Time Taken: 13:18 Temperature (F): 98.1 Height (in): 60 Pulse (bpm): 79 Weight (lbs): 160 Respiratory Rate (breaths/min): 16 Body Mass Index (BMI): 31.2 Blood Pressure (mmHg): 172/72 Reference Range: 80 - 120 mg / dl Electronic Signature(s) Signed: 04/21/2021 6:17:08 PM By: Dellie Catholic  RN Entered By: Dellie Catholic on 04/21/2021 13:35:45

## 2021-04-22 ENCOUNTER — Other Ambulatory Visit (HOSPITAL_BASED_OUTPATIENT_CLINIC_OR_DEPARTMENT_OTHER): Payer: Self-pay

## 2021-04-22 ENCOUNTER — Other Ambulatory Visit: Payer: Self-pay | Admitting: Nurse Practitioner

## 2021-04-23 ENCOUNTER — Other Ambulatory Visit (HOSPITAL_BASED_OUTPATIENT_CLINIC_OR_DEPARTMENT_OTHER): Payer: Self-pay

## 2021-04-23 ENCOUNTER — Other Ambulatory Visit (HOSPITAL_BASED_OUTPATIENT_CLINIC_OR_DEPARTMENT_OTHER): Payer: Self-pay | Admitting: Nurse Practitioner

## 2021-05-01 NOTE — Progress Notes (Signed)
KYNSLEI, ART ([id number]) Visit Report for 04/21/2021 HPI Details Patient Name: Date of Service: Madison Collins, [place] [name] 04/21/2021 1:00 PM Medical Record Number: [id number] Patient Account Number: [id number] Date of Birth/Sex: Treating RN: 09/01/1950 (71 y.o. Madison Collins Primary Care Provider: Jacolyn Reedy Other Clinician: Referring Provider: Treating Provider/Extender: Arline Asp in Treatment: 5 History of Present Illness HPI Description: ADMISSION 03/11/2021 This is a 71 year old woman who was problem began sometime after Christmas when she had a fell at work down stairs. She had an abrasion on her left knee. She was seen in the ER on 02/24/2021 and x-ray was negative. I do not believe she was given antibiotics. Sometime after this she was also seen by orthopedics perhaps on 7 January although I do not see an note here. After this she developed a rapidly increasing swelling over the left infrapatellar tendon area very painful. She also has a new wound on the left distal thigh above the patella. She is not on any antibiotics. Past medical history includes chronic pain, hypertension, depression, variably type 2 diabetes or prediabetic, hypercholesterolemia and osteoarthritis of the knees 1/25; only the abscess of the infrapatellar tendon on the left as left. The area that was on the thigh and more distally on the lower leg are healed. Culture of this grew MRSA and some strep. I had her on doxycycline and this is worked well 2/2; infrapatellar tendon on the left only wound that is remaining. This was initially an abscess. She has been using silver alginate. I gave her a course of doxycycline for MRSA and strep this is worked well. 2/14; infrapatellar tendon. Dimensions are down nicely but we have been using silver collagen. 2/28; infrapatellar tendon region. Initially traumatic with a secondary infection. This is all but closed the day using silver  collagen Electronic Signature(s) Signed: 04/21/2021 4:13:40 PM By: Linton Ham MD Entered By: Linton Ham on 04/21/2021 13:53:13 -------------------------------------------------------------------------------- Physical Exam Details Patient Name: Date of Service: Madison Corporal, [place] [name] 04/21/2021 1:00 PM Medical Record Number: [id number] Patient Account Number: [id number] Date of Birth/Sex: Treating RN: 14-Jul-1950 (71 y.o. Madison Collins Primary Care Provider: Jacolyn Reedy Other Clinician: Referring Provider: Treating Provider/Extender: Arline Asp in Treatment: 5 Constitutional Patient is hypertensive.. Pulse regular and within target range for patient.Marland Kitchen Respirations regular, non-labored and within target range.. Temperature is normal and within the target range for the patient.Marland Kitchen Appears in no distress. Notes Wound exam; infrapatellar tendon on the left. This is really very small area of visits left ear. Healthy granulation tissue no surrounding infection. Electronic Signature(s) Signed: 04/21/2021 4:13:40 PM By: Linton Ham MD Entered By: Linton Ham on 04/21/2021 13:54:44 -------------------------------------------------------------------------------- Physician Orders Details Patient Name: Date of Service: Madison Corporal, [place] [name] 04/21/2021 1:00 PM Medical Record Number: [id number] Patient Account Number: [id number] Date of Birth/Sex: Treating RN: 03-13-1950 (71 y.o. Madison Collins Primary Care Provider: Jacolyn Reedy Other Clinician: Referring Provider: Treating Provider/Extender: Arline Asp in Treatment: 5 Verbal / Phone Orders: No Diagnosis Coding Follow-up Appointments ppointment in 2 weeks. - Dr Celine Ahr. *** If you are healed by next visit, please just call us and let us know**** Return A Good Job with your home wound care! Other: - [employer] Bathing/ Shower/ Hygiene May shower and wash wound with soap and  water. Edema Control - Lymphedema / SCD / Other Left Lower Extremity Elevate legs to the level of the heart or above for 30  minutes daily and/or when sitting, a frequency of: - throughout the day Wound Treatment Wound #1 - Knee Wound Laterality: Left, Distal Cleanser: Normal Saline 1 x Per Day/15 Days Discharge Instructions: Cleanse the wound with Normal Saline prior to applying a clean dressing using gauze sponges, not tissue or cotton balls. Cleanser: Soap and Water 1 x Per Day/15 Days Discharge Instructions: May shower and wash wound with dial antibacterial soap and water prior to dressing change. Prim Dressing: Promogran Prisma Matrix, 4.34 (sq in) (silver collagen) 1 x Per Day/15 Days ary Discharge Instructions: Moisten collagen with saline or hydrogel Secondary Dressing: Woven Gauze Sponge, Non-Sterile 4x4 in 1 x Per Day/15 Days Discharge Instructions: Apply over primary dressing as directed. Secondary Dressing: ABD Pad, 8x10 1 x Per Day/15 Days Discharge Instructions: Apply over primary dressing as directed. Secured With: [employer], 4.5x3.1 (in/yd) 1 x Per Day/15 Days Discharge Instructions: Secure with Kerlix as directed. Secured With: 65M Medipore Public affairs consultant Surgical T 2x10 (in/yd) 1 x Per Day/15 Days ape Discharge Instructions: Secure with tape as directed. Electronic Signature(s) Signed: 04/21/2021 4:13:40 PM By: Linton Ham MD Signed: 04/21/2021 6:17:08 PM By: Dellie Catholic RN Entered By: Dellie Catholic on 04/21/2021 13:46:59 -------------------------------------------------------------------------------- Problem List Details Patient Name: Date of Service: Madison Corporal, [place] [name] 04/21/2021 1:00 PM Medical Record Number: [id number] Patient Account Number: [id number] Date of Birth/Sex: Treating RN: 1950/04/24 (71 y.o. Madison Collins Primary Care Provider: Jacolyn Reedy Other Clinician: Referring Provider: Treating Provider/Extender: Arline Asp in Treatment: 5 Active Problems ICD-10 Encounter Code Description Active Date MDM Diagnosis [phone] Non-pressure chronic ulcer of other part of left lower leg with other specified 03/11/2021 No Yes severity M65.062 Abscess of tendon sheath, left lower leg 03/11/2021 No Yes L97.121 Non-pressure chronic ulcer of left thigh limited to breakdown of skin 03/11/2021 No Yes Inactive Problems Resolved Problems Electronic Signature(s) Signed: 04/21/2021 4:13:40 PM By: Linton Ham MD Entered By: Linton Ham on 04/21/2021 13:52:28 -------------------------------------------------------------------------------- Progress Note Details Patient Name: Date of Service: Madison Corporal, [place] [name] 04/21/2021 1:00 PM Medical Record Number: [id number] Patient Account Number: [id number] Date of Birth/Sex: Treating RN: 08-02-1950 (71 y.o. Madison Collins Primary Care Provider: Jacolyn Reedy Other Clinician: Referring Provider: Treating Provider/Extender: Arline Asp in Treatment: 5 Subjective History of Present Illness (HPI) ADMISSION 03/11/2021 This is a 71 year old woman who was problem began sometime after Christmas when she had a fell at work down stairs. She had an abrasion on her left knee. She was seen in the ER on 02/24/2021 and x-ray was negative. I do not believe she was given antibiotics. Sometime after this she was also seen by orthopedics perhaps on 7 January although I do not see an note here. After this she developed a rapidly increasing swelling over the left infrapatellar tendon area very painful. She also has a new wound on the left distal thigh above the patella. She is not on any antibiotics. Past medical history includes chronic pain, hypertension, depression, variably type 2 diabetes or prediabetic, hypercholesterolemia and osteoarthritis of the knees 1/25; only the abscess of the infrapatellar tendon on the left as left. The area that  was on the thigh and more distally on the lower leg are healed. Culture of this grew MRSA and some strep. I had her on doxycycline and this is worked well 2/2; infrapatellar tendon on the left only wound that is remaining. This was initially an abscess. She has been using silver alginate.  I gave her a course of doxycycline for MRSA and strep this is worked well. 2/14; infrapatellar tendon. Dimensions are down nicely but we have been using silver collagen. 2/28; infrapatellar tendon region. Initially traumatic with a secondary infection. This is all but closed the day using silver collagen Objective Constitutional Patient is hypertensive.. Pulse regular and within target range for patient.Marland Kitchen Respirations regular, non-labored and within target range.. Temperature is normal and within the target range for the patient.Marland Kitchen Appears in no distress. Vitals Time Taken: 1:18 PM, Height: 60 in, Weight: 160 lbs, BMI: 31.2, Temperature: 98.1 F, Pulse: 79 bpm, Respiratory Rate: 16 breaths/min, Blood Pressure: 172/72 mmHg. General Notes: Wound exam; infrapatellar tendon on the left. This is really very small area of visits left ear. Healthy granulation tissue no surrounding infection. Integumentary (Hair, Skin) Wound #1 status is Open. Original cause of wound was Trauma. The date acquired was: 02/24/2021. The wound has been in treatment 5 weeks. The wound is located on the Left,Distal Knee. The wound measures 0.3cm length x 0.8cm width x 0.1cm depth; 0.188cm^2 area and 0.019cm^3 volume. There is Fat Layer (Subcutaneous Tissue) exposed. There is no tunneling or undermining noted. There is a medium amount of serosanguineous drainage noted. The wound margin is distinct with the outline attached to the wound base. There is large (67-100%) red, pink granulation within the wound bed. There is a small (1-33%) amount of necrotic tissue within the wound bed including Adherent Slough. Assessment Active  Problems ICD-10 Non-pressure chronic ulcer of other part of left lower leg with other specified severity Abscess of tendon sheath, left lower leg Non-pressure chronic ulcer of left thigh limited to breakdown of skin Plan Follow-up Appointments: Return Appointment in 2 weeks. - Dr Celine Ahr. *** If you are healed by next visit, please just call us and let us know**** Good Job with your home wound care! Other: - [employer] Bathing/ Shower/ Hygiene: May shower and wash wound with soap and water. Edema Control - Lymphedema / SCD / Other: Elevate legs to the level of the heart or above for 30 minutes daily and/or when sitting, a frequency of: - throughout the day WOUND #1: - Knee Wound Laterality: Left, Distal Cleanser: Normal Saline 1 x Per Day/15 Days Discharge Instructions: Cleanse the wound with Normal Saline prior to applying a clean dressing using gauze sponges, not tissue or cotton balls. Cleanser: Soap and Water 1 x Per Day/15 Days Discharge Instructions: May shower and wash wound with dial antibacterial soap and water prior to dressing change. Prim Dressing: Promogran Prisma Matrix, 4.34 (sq in) (silver collagen) 1 x Per Day/15 Days ary Discharge Instructions: Moisten collagen with saline or hydrogel Secondary Dressing: Woven Gauze Sponge, Non-Sterile 4x4 in 1 x Per Day/15 Days Discharge Instructions: Apply over primary dressing as directed. Secondary Dressing: ABD Pad, 8x10 1 x Per Day/15 Days Discharge Instructions: Apply over primary dressing as directed. Secured With: [employer], 4.5x3.1 (in/yd) 1 x Per Day/15 Days Discharge Instructions: Secure with Kerlix as directed. Secured With: 107M Medipore Public affairs consultant Surgical T 2x10 (in/yd) 1 x Per Day/15 Days ape Discharge Instructions: Secure with tape as directed. #1 I'm continuing with the Silver collagen border foam cover. This should be healed in the next week. I've given an appointment for 2 weeks in case something happens  however I've asked her to call us as I expect this to be fully healed Electronic Signature(s) Signed: 04/21/2021 4:13:40 PM By: Linton Ham MD Entered By: Linton Ham on 04/21/2021 13:55:53 --------------------------------------------------------------------------------  SuperBill Details Patient Name: Date of ServiceMarlyn Collins, [place] [name] 04/21/2021 Medical Record Number: [id number] Patient Account Number: [id number] Date of Birth/Sex: Treating RN: January 31, 1951 (71 y.o. Madison Collins Primary Care Provider: Jacolyn Reedy Other Clinician: Referring Provider: Treating Provider/Extender: Arline Asp in Treatment: 5 Diagnosis Coding ICD-10 Codes Code Description [phone] Non-pressure chronic ulcer of other part of left lower leg with other specified severity M65.062 Abscess of tendon sheath, left lower leg L97.121 Non-pressure chronic ulcer of left thigh limited to breakdown of skin Facility Procedures CPT4 Code: [id number] Description: 99213 - WOUND CARE VISIT-LEV 3 EST PT Modifier: Quantity: 1 Physician Procedures : CPT4 Code Description Modifier [id number] - WC PHYS LEVEL 2 - EST PT ICD-10 Diagnosis Description M65.062 Abscess of tendon sheath, left lower leg L97.828 Non-pressure chronic ulcer of other part of left lower leg with other specified severity Quantity: 1 Electronic Signature(s) Signed: 04/21/2021 6:17:08 PM By: Dellie Catholic RN Signed: 05/01/2021 12:53:18 PM By: Linton Ham MD Previous Signature: 04/21/2021 4:13:40 PM Version By: Linton Ham MD Entered By: Dellie Catholic on 04/21/2021 18:00:42

## 2021-05-05 ENCOUNTER — Encounter (HOSPITAL_BASED_OUTPATIENT_CLINIC_OR_DEPARTMENT_OTHER): Payer: PPO | Admitting: General Surgery

## 2021-05-12 ENCOUNTER — Other Ambulatory Visit (HOSPITAL_BASED_OUTPATIENT_CLINIC_OR_DEPARTMENT_OTHER): Payer: Self-pay

## 2021-05-13 ENCOUNTER — Other Ambulatory Visit (HOSPITAL_BASED_OUTPATIENT_CLINIC_OR_DEPARTMENT_OTHER): Payer: Self-pay

## 2021-06-10 ENCOUNTER — Other Ambulatory Visit (HOSPITAL_BASED_OUTPATIENT_CLINIC_OR_DEPARTMENT_OTHER): Payer: Self-pay

## 2021-06-15 ENCOUNTER — Other Ambulatory Visit (HOSPITAL_BASED_OUTPATIENT_CLINIC_OR_DEPARTMENT_OTHER): Payer: Self-pay

## 2021-06-24 ENCOUNTER — Other Ambulatory Visit (HOSPITAL_BASED_OUTPATIENT_CLINIC_OR_DEPARTMENT_OTHER): Payer: Self-pay

## 2021-07-07 ENCOUNTER — Other Ambulatory Visit (HOSPITAL_BASED_OUTPATIENT_CLINIC_OR_DEPARTMENT_OTHER): Payer: Self-pay

## 2021-07-07 ENCOUNTER — Encounter (HOSPITAL_BASED_OUTPATIENT_CLINIC_OR_DEPARTMENT_OTHER): Payer: Self-pay

## 2021-07-23 ENCOUNTER — Other Ambulatory Visit (HOSPITAL_BASED_OUTPATIENT_CLINIC_OR_DEPARTMENT_OTHER): Payer: Self-pay

## 2021-07-24 ENCOUNTER — Encounter (HOSPITAL_BASED_OUTPATIENT_CLINIC_OR_DEPARTMENT_OTHER): Payer: PPO

## 2021-07-29 ENCOUNTER — Ambulatory Visit (HOSPITAL_BASED_OUTPATIENT_CLINIC_OR_DEPARTMENT_OTHER): Payer: PPO | Admitting: Nurse Practitioner

## 2021-08-17 ENCOUNTER — Ambulatory Visit (HOSPITAL_BASED_OUTPATIENT_CLINIC_OR_DEPARTMENT_OTHER): Payer: PPO | Admitting: Nurse Practitioner

## 2021-08-19 ENCOUNTER — Other Ambulatory Visit (HOSPITAL_BASED_OUTPATIENT_CLINIC_OR_DEPARTMENT_OTHER): Payer: Self-pay

## 2021-08-31 ENCOUNTER — Other Ambulatory Visit (HOSPITAL_BASED_OUTPATIENT_CLINIC_OR_DEPARTMENT_OTHER): Payer: Self-pay

## 2021-09-03 ENCOUNTER — Other Ambulatory Visit (HOSPITAL_BASED_OUTPATIENT_CLINIC_OR_DEPARTMENT_OTHER): Payer: Self-pay

## 2021-09-18 ENCOUNTER — Other Ambulatory Visit (HOSPITAL_BASED_OUTPATIENT_CLINIC_OR_DEPARTMENT_OTHER): Payer: Self-pay

## 2021-09-30 ENCOUNTER — Other Ambulatory Visit (HOSPITAL_BASED_OUTPATIENT_CLINIC_OR_DEPARTMENT_OTHER): Payer: Self-pay

## 2021-10-02 ENCOUNTER — Other Ambulatory Visit (HOSPITAL_BASED_OUTPATIENT_CLINIC_OR_DEPARTMENT_OTHER): Payer: Self-pay

## 2021-10-21 ENCOUNTER — Ambulatory Visit (INDEPENDENT_AMBULATORY_CARE_PROVIDER_SITE_OTHER): Payer: PPO | Admitting: Nurse Practitioner

## 2021-10-21 ENCOUNTER — Other Ambulatory Visit (HOSPITAL_BASED_OUTPATIENT_CLINIC_OR_DEPARTMENT_OTHER): Payer: Self-pay

## 2021-10-21 ENCOUNTER — Encounter (HOSPITAL_BASED_OUTPATIENT_CLINIC_OR_DEPARTMENT_OTHER): Payer: Self-pay | Admitting: Nurse Practitioner

## 2021-10-21 VITALS — BP 122/64 | HR 75 | Ht 60.0 in | Wt 160.0 lb

## 2021-10-21 DIAGNOSIS — G4700 Insomnia, unspecified: Secondary | ICD-10-CM | POA: Diagnosis not present

## 2021-10-21 DIAGNOSIS — F418 Other specified anxiety disorders: Secondary | ICD-10-CM

## 2021-10-21 DIAGNOSIS — E1159 Type 2 diabetes mellitus with other circulatory complications: Secondary | ICD-10-CM | POA: Diagnosis not present

## 2021-10-21 DIAGNOSIS — E1165 Type 2 diabetes mellitus with hyperglycemia: Secondary | ICD-10-CM

## 2021-10-21 DIAGNOSIS — E559 Vitamin D deficiency, unspecified: Secondary | ICD-10-CM | POA: Diagnosis not present

## 2021-10-21 DIAGNOSIS — G894 Chronic pain syndrome: Secondary | ICD-10-CM

## 2021-10-21 DIAGNOSIS — Z8639 Personal history of other endocrine, nutritional and metabolic disease: Secondary | ICD-10-CM | POA: Diagnosis not present

## 2021-10-21 DIAGNOSIS — K219 Gastro-esophageal reflux disease without esophagitis: Secondary | ICD-10-CM | POA: Diagnosis not present

## 2021-10-21 DIAGNOSIS — I152 Hypertension secondary to endocrine disorders: Secondary | ICD-10-CM

## 2021-10-21 DIAGNOSIS — G43009 Migraine without aura, not intractable, without status migrainosus: Secondary | ICD-10-CM | POA: Diagnosis not present

## 2021-10-21 MED ORDER — DICLOFENAC SODIUM 75 MG PO TBEC
75.0000 mg | DELAYED_RELEASE_TABLET | Freq: Two times a day (BID) | ORAL | 11 refills | Status: DC
  Filled 2021-10-21: qty 60, 30d supply, fill #0
  Filled 2021-12-03: qty 60, 30d supply, fill #1
  Filled 2022-01-05: qty 60, 30d supply, fill #2
  Filled 2022-02-04: qty 60, 30d supply, fill #3
  Filled 2022-03-09 (×2): qty 60, 30d supply, fill #4

## 2021-10-21 MED ORDER — CITALOPRAM HYDROBROMIDE 40 MG PO TABS
40.0000 mg | ORAL_TABLET | Freq: Every morning | ORAL | 3 refills | Status: DC
  Filled 2021-10-21 – 2022-01-05 (×2): qty 90, 90d supply, fill #0

## 2021-10-21 MED ORDER — SUMATRIPTAN SUCCINATE 100 MG PO TABS
100.0000 mg | ORAL_TABLET | ORAL | 5 refills | Status: DC
  Filled 2021-10-21: qty 9, 30d supply, fill #0

## 2021-10-21 MED ORDER — AMLODIPINE BESYLATE 10 MG PO TABS
10.0000 mg | ORAL_TABLET | Freq: Every day | ORAL | 3 refills | Status: DC
  Filled 2021-10-21: qty 90, 90d supply, fill #0
  Filled 2022-02-04: qty 90, 90d supply, fill #1

## 2021-10-21 MED ORDER — LOVASTATIN 40 MG PO TABS
40.0000 mg | ORAL_TABLET | Freq: Every day | ORAL | 3 refills | Status: DC
  Filled 2021-10-21: qty 90, 90d supply, fill #0

## 2021-10-21 MED ORDER — GABAPENTIN 600 MG PO TABS
600.0000 mg | ORAL_TABLET | Freq: Three times a day (TID) | ORAL | 5 refills | Status: DC
  Filled 2021-10-21: qty 90, 30d supply, fill #0
  Filled 2021-12-03: qty 90, 30d supply, fill #1
  Filled 2022-01-05: qty 90, 30d supply, fill #2
  Filled 2022-02-04: qty 90, 30d supply, fill #3
  Filled 2022-03-09 (×2): qty 90, 30d supply, fill #4

## 2021-10-21 MED ORDER — CHLORTHALIDONE 25 MG PO TABS
25.0000 mg | ORAL_TABLET | Freq: Every day | ORAL | 3 refills | Status: DC
  Filled 2021-10-21 – 2021-12-03 (×2): qty 90, 90d supply, fill #0
  Filled 2022-03-09 (×2): qty 90, 90d supply, fill #1

## 2021-10-21 MED ORDER — CYCLOBENZAPRINE HCL 10 MG PO TABS
10.0000 mg | ORAL_TABLET | Freq: Two times a day (BID) | ORAL | 5 refills | Status: DC | PRN
  Filled 2021-10-21: qty 60, 30d supply, fill #0
  Filled 2022-01-05: qty 60, 30d supply, fill #1
  Filled 2022-03-09 (×2): qty 60, 30d supply, fill #2

## 2021-10-21 MED ORDER — OMEPRAZOLE 20 MG PO CPDR
20.0000 mg | DELAYED_RELEASE_CAPSULE | Freq: Every morning | ORAL | 3 refills | Status: DC
  Filled 2021-10-21 – 2022-01-05 (×2): qty 90, 90d supply, fill #0
  Filled 2022-04-01: qty 90, 90d supply, fill #1

## 2021-10-21 MED ORDER — METFORMIN HCL ER 500 MG PO TB24
1000.0000 mg | ORAL_TABLET | Freq: Every day | ORAL | 5 refills | Status: DC
  Filled 2021-10-21 – 2021-12-03 (×4): qty 60, 30d supply, fill #0
  Filled 2022-01-05: qty 60, 30d supply, fill #1
  Filled 2022-03-09 (×2): qty 60, 30d supply, fill #2

## 2021-10-21 MED ORDER — LOSARTAN POTASSIUM 100 MG PO TABS
100.0000 mg | ORAL_TABLET | Freq: Every day | ORAL | 3 refills | Status: DC
  Filled 2021-10-21 – 2021-12-03 (×2): qty 90, 90d supply, fill #0
  Filled 2022-03-09 (×2): qty 90, 90d supply, fill #1

## 2021-10-21 NOTE — Progress Notes (Signed)
Madison Keeler, DNP, AGNP-c [address] [place], [place] [address] [phone] Office [phone] Fax  ESTABLISHED PATIENT- Chronic Health and/or Follow-Up Visit  Blood pressure 122/64, pulse 75, height 5' (1.524 m), weight 160 lb (72.6 kg), SpO2 99 %.  HPI  Madison Collins  is a 71 y.o. year old female presenting today for evaluation and management of the following: Follow-up (Patient presents today for follow up. Only concern is she is not sleeping well. )  HTN She tells me her BP is doing well. No dizziness, palpitations, ShOB, LE edema, or HA. She is taking her medication as prescribed without missed doses. No SE of medications.  DM She follows a low carb diet and primarily selects healthy options. She is not sure what her BG levels have been as she is not checking BG at home. Denies symptoms of low BG. No vision changes, nausea, weakness, sweating, increased hunger, thirst, or urination present. Tolerating metformin without significant SE Not sleeping well She reports it takes her a long time to fall asleep. She tells me she will lay there for long periods of time unable to dose off, even when very tired. She endorses changing habits at bedtime to help, but this has not been effective. No late night eating or caffeine in the evenings. Follows good sleep hygiene.  Chronic Pain She was previously going to the pain clinic, but she tells me they "kicked me out". She has been on gabapentin, diclofenac, and cyclobenzaprine for management. She endorses pain in 'my whole body". She tells me that when she was taking the medications her pain was much better than it is now Her goal pain level is a 2-3/10 and she tells me it stays at a 7-8/10 all the time. She can get close to her goal with medications sometimes.   ROS All ROS negative with exception of what is listed in HPI  PHYSICAL EXAM Physical Exam Vitals and nursing  note reviewed.  Constitutional:      General: She is not in acute distress.    Appearance: Normal appearance.  HENT:     Head: Normocephalic.  Eyes:     Extraocular Movements: Extraocular movements intact.     Conjunctiva/sclera: Conjunctivae normal.     Pupils: Pupils are equal, round, and reactive to light.  Neck:     Vascular: No carotid bruit.  Cardiovascular:     Rate and Rhythm: Normal rate and regular rhythm.     Pulses: Normal pulses.     Heart sounds: Normal heart sounds. No murmur heard. Pulmonary:     Effort: Pulmonary effort is normal.     Breath sounds: Normal breath sounds. No wheezing.  Abdominal:     General: Bowel sounds are normal. There is no distension.     Palpations: Abdomen is soft.     Tenderness: There is no abdominal tenderness. There is no guarding.  Musculoskeletal:        General: Tenderness present. Normal range of motion.     Cervical back: Normal range of motion and neck supple.     Right lower leg: No edema.     Left lower leg: No edema.     Comments: Widespread musculoskeletal tenderness expressed.  Lymphadenopathy:     Cervical: No cervical adenopathy.  Skin:    General: Skin is warm and dry.     Capillary Refill: Capillary refill takes less than 2 seconds.  Neurological:     General:  No focal deficit present.     Mental Status: She is alert and oriented to person, place, and time.  Psychiatric:        Mood and Affect: Mood normal.        Behavior: Behavior normal.        Thought Content: Thought content normal.        Judgment: Judgment normal.     ASSESSMENT & PLAN Problem List Items Addressed This Visit     Chronic pain syndrome (Chronic)    Chronic.  Unfortunately she is unable to return to pain management for unknown reasons.  She is no longer on opiate medications which is fantastic.  Previous management with citalopram, cyclobenzaprine, diclofenac, and gabapentin for successful.  We will plan to continue those medications  today.  If unable to get good control with these treatments we will need to refer to additional pain management.      Relevant Medications   citalopram (CELEXA) 40 MG tablet   cyclobenzaprine (FLEXERIL) 10 MG tablet   diclofenac (VOLTAREN) 75 MG EC tablet   gabapentin (NEURONTIN) 600 MG tablet   Hypertension associated with diabetes (HCC)    Chronic.  Blood pressure well controlled today.  Continue amlodipine, chlorthalidone, and losartan daily.  Labs pending.  Monitor blood pressure at home at least 2 times a week.  Blood pressure goal less than 130/85.      Relevant Medications   amLODipine (NORVASC) 10 MG tablet   chlorthalidone (HYGROTON) 25 MG tablet   losartan (COZAAR) 100 MG tablet   lovastatin (MEVACOR) 40 MG tablet   metFORMIN (GLUCOPHAGE-XR) 500 MG 24 hr tablet   Other Relevant Orders   CBC With Diff/Platelet (Completed)   Comprehensive metabolic panel (Completed)   POCT UA - Microalbumin   Migraine    Chronic. Controlled with current regimen. Refills today. No alarm sx. Exam normal.       Relevant Medications   amLODipine (NORVASC) 10 MG tablet   chlorthalidone (HYGROTON) 25 MG tablet   citalopram (CELEXA) 40 MG tablet   cyclobenzaprine (FLEXERIL) 10 MG tablet   diclofenac (VOLTAREN) 75 MG EC tablet   gabapentin (NEURONTIN) 600 MG tablet   losartan (COZAAR) 100 MG tablet   lovastatin (MEVACOR) 40 MG tablet   SUMAtriptan (IMITREX) 100 MG tablet   Insomnia    Chronic insomnia of unknown etiology.  Patient is currently off of her pain medications which certainly could be exacerbating this symptom.  At this time we will try to get her pain levels under better control and see if we can get improved sleep.  She is on several sedating medications therefore I am not comfortable starting her on additional sedatives at this time.  We will continue to monitor.      Depression with anxiety    Chronic.  Currently on citalopram 40 mg.  Reports symptoms are fairly well controlled  at this time.  Continue to monitor closely.      Relevant Medications   citalopram (CELEXA) 40 MG tablet   Gastroesophageal reflux    Chronic.  Managed on Prilosec.  Refills provided today.  No alarm symptoms on examination.      Relevant Medications   omeprazole (PRILOSEC) 20 MG capsule   Diabetes ([place]) - Primary    Chronic.  Currently managed on 1000 mg daily.  Unclear of blood glucose averages as she has not been monitoring her blood sugar levels.  We do not have a recent A1c, we will plan to get  that today.  Encourage blood glucose monitoring with a.m. fasting levels, diabetic diet, at least 150 minutes of moderate intensity physical activity weekly.  Labs pending.      Relevant Medications   losartan (COZAAR) 100 MG tablet   lovastatin (MEVACOR) 40 MG tablet   metFORMIN (GLUCOPHAGE-XR) 500 MG 24 hr tablet   Other Relevant Orders   Hemoglobin A1c (Completed)   Lipid panel (Completed)   POCT UA - Microalbumin   Other Visit Diagnoses     Vitamin D deficiency       Relevant Medications   Vitamin D, Ergocalciferol, (DRISDOL) 1.25 MG (50000 UNIT) CAPS capsule   Other Relevant Orders   VITAMIN D 25 Hydroxy (Vit-D Deficiency, Fractures) (Completed)   H/O non anemic vitamin B12 deficiency       Relevant Orders   B12 and Folate Panel (Completed)       Return in about 6 months (around 04/22/2022).   Madison Keeler, DNP, AGNP-c 10/21/2021  9:08 AM  History, Medications, Surgery, SDOH, and Family History reviewed and updated as appropriate.  Health Maintenance Schedule Health Maintenance reviewed - urinary microalbumin ordered.

## 2021-10-22 LAB — LIPID PANEL
Chol/HDL Ratio: 3.3 ratio (ref 0.0–4.4)
Cholesterol, Total: 180 mg/dL (ref 100–199)
HDL: 55 mg/dL (ref >39)
LDL Chol Calc (NIH): 105 mg/dL — ABNORMAL HIGH (ref 0–99)
Triglycerides: 110 mg/dL (ref 0–149)
VLDL Cholesterol Cal: 20 mg/dL (ref 5–40)

## 2021-10-22 LAB — COMPREHENSIVE METABOLIC PANEL
ALT: 11 IU/L (ref 0–32)
AST: 11 IU/L (ref 0–40)
Albumin/Globulin Ratio: 1.7 (ref 1.2–2.2)
Albumin: 4.5 g/dL (ref 3.9–4.9)
Alkaline Phosphatase: 98 IU/L (ref 44–121)
BUN/Creatinine Ratio: 26 (ref 12–28)
BUN: 22 mg/dL (ref 8–27)
Bilirubin Total: 0.4 mg/dL (ref 0.0–1.2)
CO2: 22 mmol/L (ref 20–29)
Calcium: 9.5 mg/dL (ref 8.7–10.3)
Chloride: 99 mmol/L (ref 96–106)
Creatinine, Ser: 0.86 mg/dL (ref 0.57–1.00)
Globulin, Total: 2.6 g/dL (ref 1.5–4.5)
Glucose: 121 mg/dL — ABNORMAL HIGH (ref 70–99)
Potassium: 3.4 mmol/L — ABNORMAL LOW (ref 3.5–5.2)
Sodium: 138 mmol/L (ref 134–144)
Total Protein: 7.1 g/dL (ref 6.0–8.5)
eGFR: 74 mL/min/{1.73_m2} (ref >59)

## 2021-10-22 LAB — B12 AND FOLATE PANEL
Folate: >20 ng/mL (ref >3.0)
Vitamin B-12: 355 pg/mL (ref 232–1245)

## 2021-10-22 LAB — VITAMIN D 25 HYDROXY (VIT D DEFICIENCY, FRACTURES): Vit D, 25-Hydroxy: 22.2 ng/mL — ABNORMAL LOW (ref 30.0–100.0)

## 2021-10-22 LAB — CBC WITH DIFF/PLATELET
Basophils Absolute: 0.1 10*3/uL (ref 0.0–0.2)
Basos: 1 %
EOS (ABSOLUTE): 0.1 10*3/uL (ref 0.0–0.4)
Eos: 1 %
Hematocrit: 35.3 % (ref 34.0–46.6)
Hemoglobin: 11.9 g/dL (ref 11.1–15.9)
Immature Grans (Abs): 0 10*3/uL (ref 0.0–0.1)
Immature Granulocytes: 0 %
Lymphocytes Absolute: 2.9 10*3/uL (ref 0.7–3.1)
Lymphs: 24 %
MCH: 28.8 pg (ref 26.6–33.0)
MCHC: 33.7 g/dL (ref 31.5–35.7)
MCV: 86 fL (ref 79–97)
Monocytes Absolute: 0.8 10*3/uL (ref 0.1–0.9)
Monocytes: 7 %
Neutrophils Absolute: 7.9 10*3/uL — ABNORMAL HIGH (ref 1.4–7.0)
Neutrophils: 67 %
Platelets: 375 10*3/uL (ref 150–450)
RBC: 4.13 x10E6/uL (ref 3.77–5.28)
RDW: 13.1 % (ref 11.7–15.4)
WBC: 11.7 10*3/uL — ABNORMAL HIGH (ref 3.4–10.8)

## 2021-10-22 LAB — HEMOGLOBIN A1C
Est. average glucose Bld gHb Est-mCnc: 146 mg/dL
Hgb A1c MFr Bld: 6.7 % — ABNORMAL HIGH (ref 4.8–5.6)

## 2021-10-29 ENCOUNTER — Other Ambulatory Visit (HOSPITAL_BASED_OUTPATIENT_CLINIC_OR_DEPARTMENT_OTHER): Payer: Self-pay

## 2021-10-30 MED ORDER — VITAMIN D (ERGOCALCIFEROL) 1.25 MG (50000 UNIT) PO CAPS
50000.0000 [IU] | ORAL_CAPSULE | ORAL | 3 refills | Status: DC
Start: 2021-10-30 — End: 2022-11-15
  Filled 2021-10-30: qty 12, 84d supply, fill #0
  Filled 2021-12-04: qty 4, 28d supply, fill #0

## 2021-11-02 ENCOUNTER — Other Ambulatory Visit (HOSPITAL_BASED_OUTPATIENT_CLINIC_OR_DEPARTMENT_OTHER): Payer: Self-pay

## 2021-11-16 ENCOUNTER — Other Ambulatory Visit (HOSPITAL_BASED_OUTPATIENT_CLINIC_OR_DEPARTMENT_OTHER): Payer: Self-pay

## 2021-11-21 NOTE — Assessment & Plan Note (Signed)
Chronic.  Unfortunately she is unable to return to pain management for unknown reasons.  She is no longer on opiate medications which is fantastic.  Previous management with citalopram, cyclobenzaprine, diclofenac, and gabapentin for successful.  We will plan to continue those medications today.  If unable to get good control with these treatments we will need to refer to additional pain management.

## 2021-11-21 NOTE — Assessment & Plan Note (Signed)
Chronic.  Managed on Prilosec.  Refills provided today.  No alarm symptoms on examination.

## 2021-11-21 NOTE — Assessment & Plan Note (Signed)
Chronic.  Currently on citalopram 40 mg.  Reports symptoms are fairly well controlled at this time.  Continue to monitor closely.

## 2021-11-21 NOTE — Assessment & Plan Note (Signed)
Chronic.  Blood pressure well controlled today.  Continue amlodipine, chlorthalidone, and losartan daily.  Labs pending.  Monitor blood pressure at home at least 2 times a week.  Blood pressure goal less than 130/85.

## 2021-11-21 NOTE — Assessment & Plan Note (Signed)
Chronic. Controlled with current regimen. Refills today. No alarm sx. Exam normal.

## 2021-11-21 NOTE — Assessment & Plan Note (Signed)
Chronic insomnia of unknown etiology.  Patient is currently off of her pain medications which certainly could be exacerbating this symptom.  At this time we will try to get her pain levels under better control and see if we can get improved sleep.  She is on several sedating medications therefore I am not comfortable starting her on additional sedatives at this time.  We will continue to monitor.

## 2021-11-21 NOTE — Assessment & Plan Note (Signed)
Chronic.  Currently managed on 1000 mg daily.  Unclear of blood glucose averages as she has not been monitoring her blood sugar levels.  We do not have a recent A1c, we will plan to get that today.  Encourage blood glucose monitoring with a.m. fasting levels, diabetic diet, at least 150 minutes of moderate intensity physical activity weekly.  Labs pending.

## 2021-11-25 ENCOUNTER — Other Ambulatory Visit (HOSPITAL_BASED_OUTPATIENT_CLINIC_OR_DEPARTMENT_OTHER): Payer: Self-pay

## 2021-12-03 ENCOUNTER — Other Ambulatory Visit (HOSPITAL_BASED_OUTPATIENT_CLINIC_OR_DEPARTMENT_OTHER): Payer: Self-pay

## 2021-12-04 ENCOUNTER — Other Ambulatory Visit (HOSPITAL_BASED_OUTPATIENT_CLINIC_OR_DEPARTMENT_OTHER): Payer: Self-pay

## 2022-01-05 ENCOUNTER — Other Ambulatory Visit (HOSPITAL_BASED_OUTPATIENT_CLINIC_OR_DEPARTMENT_OTHER): Payer: Self-pay

## 2022-03-09 ENCOUNTER — Other Ambulatory Visit (HOSPITAL_BASED_OUTPATIENT_CLINIC_OR_DEPARTMENT_OTHER): Payer: Self-pay

## 2022-03-09 ENCOUNTER — Other Ambulatory Visit: Payer: Self-pay

## 2022-03-10 ENCOUNTER — Other Ambulatory Visit (HOSPITAL_BASED_OUTPATIENT_CLINIC_OR_DEPARTMENT_OTHER): Payer: Self-pay

## 2022-03-11 ENCOUNTER — Other Ambulatory Visit (HOSPITAL_BASED_OUTPATIENT_CLINIC_OR_DEPARTMENT_OTHER): Payer: Self-pay

## 2022-03-25 ENCOUNTER — Other Ambulatory Visit (HOSPITAL_BASED_OUTPATIENT_CLINIC_OR_DEPARTMENT_OTHER): Payer: Self-pay

## 2022-04-08 ENCOUNTER — Other Ambulatory Visit: Payer: Self-pay | Admitting: *Deleted

## 2022-04-08 DIAGNOSIS — Z1231 Encounter for screening mammogram for malignant neoplasm of breast: Secondary | ICD-10-CM

## 2022-04-15 ENCOUNTER — Other Ambulatory Visit (HOSPITAL_BASED_OUTPATIENT_CLINIC_OR_DEPARTMENT_OTHER): Payer: Self-pay

## 2022-04-22 ENCOUNTER — Ambulatory Visit: Payer: PPO | Admitting: Family Medicine

## 2022-04-22 ENCOUNTER — Ambulatory Visit (HOSPITAL_BASED_OUTPATIENT_CLINIC_OR_DEPARTMENT_OTHER): Payer: PPO | Admitting: Nurse Practitioner

## 2022-05-06 ENCOUNTER — Telehealth: Payer: Self-pay | Admitting: Family Medicine

## 2022-05-06 ENCOUNTER — Telehealth: Payer: Self-pay | Admitting: Nurse Practitioner

## 2022-05-06 NOTE — Telephone Encounter (Signed)
Called patient to schedule Medicare Annual Wellness Visit (AWV). Left message for patient to call back and schedule Medicare Annual Wellness Visit (AWV).  Last date of AWV: 03/08/19  Please schedule an appointment at any time with [facility].  If any questions, please contact me at [phone].  Thank you ,  Barkley Boards AWV direct phone # [phone]

## 2022-05-06 NOTE — Telephone Encounter (Signed)
Called pt to schedule 6 mo diab follow up.  She will also need cpe scheduled.  Mammo needed gap in care. She states she doesn't have a car and will call us back.

## 2022-05-20 ENCOUNTER — Other Ambulatory Visit: Payer: Self-pay | Admitting: Nurse Practitioner

## 2022-05-20 ENCOUNTER — Ambulatory Visit: Payer: PPO

## 2022-05-20 DIAGNOSIS — G894 Chronic pain syndrome: Secondary | ICD-10-CM

## 2022-05-20 NOTE — Telephone Encounter (Signed)
Refill request last apt was 10/21/21 sending pt. Message to schedule a future apt. As well.

## 2022-06-10 ENCOUNTER — Other Ambulatory Visit: Payer: Self-pay

## 2022-06-10 DIAGNOSIS — Z789 Other specified health status: Secondary | ICD-10-CM

## 2022-06-16 ENCOUNTER — Telehealth: Payer: Self-pay | Admitting: Nurse Practitioner

## 2022-06-16 NOTE — Telephone Encounter (Signed)
Called patient to schedule Medicare Annual Wellness Visit (AWV). Left message for patient to call back and schedule Medicare Annual Wellness Visit (AWV).  Last date of AWV: 03/08/19  Please schedule an appointment at any time with [facility].  If any questions, please contact me at [phone].  Thank you ,  Quintell Bonnin [facility] AWV direct phone # [phone] 

## 2022-06-22 DIAGNOSIS — M25532 Pain in left wrist: Secondary | ICD-10-CM | POA: Diagnosis not present

## 2022-06-22 DIAGNOSIS — M25562 Pain in left knee: Secondary | ICD-10-CM | POA: Diagnosis not present

## 2022-07-05 ENCOUNTER — Telehealth: Payer: Self-pay

## 2022-07-05 NOTE — Progress Notes (Signed)
Patient ID: Madison Collins, female   DOB: [birth year], 72 y.o.   MRN: [id number]  Care Management & Coordination Services Pharmacy Team  Reason for Encounter: Chart Prep for initial encounter with Johny Drilling D on 07/08/22 at 3:15 in office.    Spoke with patient on 07/05/2022   Have you seen any other providers since your last visit? Patient reports none  Any changes in your medications or health? Patient reports none  Any side effects from any medications? Patient reports none  Do you have an symptoms or problems not managed by your medications? Patient reports none  Any concerns about your health right now? Patent reports none  Do you get any type of exercise on a regular basis? Patient reports she is still working  Can you think of a goal you would like to reach for your health? Patient reports none  Do you have any problems getting your medications? Patient reports none  Is there anything that you would like to discuss during the appointment? Patient reports she would like recommendations for pain management/clinic.  Patient aware to bring blood pressure cuff, medications that do not need refrigeration and supplements to appointment   Chart review:  Recent office visits:   10/21/21 Tollie Eth, NP - Patient presented for Type 2 diabetes mellitus with hyperglycemia without long term current use of insulin and other concerns. Prescribed Vit D. Increased Amlodipine. Increased Metformin. Stopped Meloxicam  Recent consult visits:  None  Hospital visits:  None in previous 6 months  Fill History : amLODipine (NORVASC) 10 MG tablet 02/05/2022 90   chlorthalidone (HYGROTON) 25 MG tablet 03/25/2022 90   CITALOPRAM 40 MG TABLET 04/23/2022 90   CYCLOBENZAPRINE 10 MG TABLET 04/23/2022 30   DICLOFENAC SODIUM 75 MG TABLET, DELAYED RELEASE (ENTERIC COATED) 05/31/2022 90   Vitamin D, Ergocalciferol, (DRISDOL) 1.25 MG (50000 UNIT) CAPS capsule 12/04/2021 28    GABAPENTIN 600 MG TABLET 05/21/2022 30   losartan (COZAAR) 100 MG tablet 03/25/2022 90   lovastatin (MEVACOR) 40 MG tablet 09/03/2021 90   metFORMIN (GLUCOPHAGE-XR) 500 MG 24 hr tablet 03/25/2022 30   omeprazole (PRILOSEC) 20 MG capsule 04/01/2022 90   SUMAtriptan (IMITREX) 100 MG tablet 10/29/2021 30       Star Rating Drugs:  Losartan 100 mg - Last filled 03/25/22 30 DS at [facility] Lovastatin 40 mg - Last filled 09/03/21 90 DS at [facility] Metformin 500 mg - Last filled 03/25/22 30 DS at [facility] [place]    Care Gaps: Foot Exam - Overdue Eye Exam - Overdue Zoster Vaccine - Overdue Diabetic Urine - Overdue Colonoscopy - Overdue AWV - 03/07/22 Overdue COVID Vaccine - Overdue A1C - Overdue   Pamala Duffel CMA Clinical Pharmacist Assistant [phone]

## 2022-07-06 NOTE — Progress Notes (Signed)
Care Management & Coordination Services Pharmacy Note  07/08/2022 Name:  Madison Collins MRN:  [id number] DOB:  January 10, 1951  Summary: BP at goal <130/80 A1C at goal <7 LDL not at goal <70, extremely elevated  -States taking her statin but fill dates suggest non-adherence Due for updated DEXA scan  Recommendations/Changes made from today's visit: -Counseled to limit sodium intake and counseled on symptoms of hypo/hypertension -Counseled to limit carb intake, metformin removed for medication list as patient reports not taking for several months -ORDER an rx for diabetic meter and supplies with PCP approval -STOP lovastatin 40mg  and START Crestor 5mg  once daily, with PCP approval -ORDER DEXA Scan, with PCP approval  Follow up plan: HLD call in 1 month for statin tolerance and adherence General adherence review in 3 months Pharmacist visit in 5-6 months   Subjective: Madison Collins is an 72 y.o. year old female who is a primary patient of Early, Sung Amabile, NP.  The care coordination team was consulted for assistance with disease management and care coordination needs.    Engaged with patient face to face for initial visit. Patient presents immediately following AWV with PCP.  Updated labwork drown for lipid, uACR, Vit D, CMP, CBC, and A1C. Does not have medications or a med list with her. Still works at [employer] and is the caregiver for her 4 grandchildren that live with her (ages range from 29-17).   Recent office visits: 10/21/21 Tollie Eth, NP - Patient presented for Type 2 diabetes mellitus with hyperglycemia without long term current use of insulin and other concerns. Prescribed Vit D. Increased Amlodipine. Increased Metformin. Stopped Meloxicam   Recent consult visits: None  Hospital visits: None in previous 6 months   Objective:  Lab Results  Component Value Date   CREATININE 0.86 10/21/2021   BUN 22 10/21/2021   EGFR 74 10/21/2021   GFRNONAA 87  04/15/2020   GFRAA 100 04/15/2020   NA 138 10/21/2021   K 3.4 (L) 10/21/2021   CALCIUM 9.5 10/21/2021   CO2 22 10/21/2021   GLUCOSE 121 (H) 10/21/2021    Lab Results  Component Value Date/Time   HGBA1C 6.7 (H) 10/21/2021 09:56 AM   HGBA1C 6.6 (H) 04/15/2020 10:36 AM    Last diabetic Eye exam: No results found for: "HMDIABEYEEXA"  Last diabetic Foot exam: No results found for: "HMDIABFOOTEX"   Lab Results  Component Value Date   CHOL 180 10/21/2021   HDL 55 10/21/2021   LDLCALC 105 (H) 10/21/2021   TRIG 110 10/21/2021   CHOLHDL 3.3 10/21/2021       Latest Ref Rng & Units 10/21/2021    9:56 AM 01/28/2021   11:18 AM 04/15/2020   10:36 AM  Hepatic Function  Total Protein 6.0 - 8.5 g/dL 7.1  7.3  6.9   Albumin 3.9 - 4.9 g/dL 4.5  4.8  4.4   AST 0 - 40 IU/L 11  14  14    ALT 0 - 32 IU/L 11  9  13    Alk Phosphatase 44 - 121 IU/L 98  84  110   Total Bilirubin 0.0 - 1.2 mg/dL 0.4  0.3  0.3     Lab Results  Component Value Date/Time   TSH 3.140 07/10/2019 11:54 AM   TSH 1.220 01/05/2019 10:59 AM   FREET4 1.26 06/15/2017 04:48 PM       Latest Ref Rng & Units 10/21/2021    9:56 AM 01/28/2021   11:18 AM 03/17/2020  4:52 PM  CBC  WBC 3.4 - 10.8 x10E3/uL 11.7  8.7  16.3   Hemoglobin 11.1 - 15.9 g/dL 16.1  09.6  04.5   Hematocrit 34.0 - 46.6 % 35.3  37.0  36.8   Platelets 150 - 450 x10E3/uL 375  312  316     Lab Results  Component Value Date/Time   VD25OH 22.2 (L) 10/21/2021 09:56 AM   VD25OH 28.9 (L) 07/10/2019 11:54 AM   VITAMINB12 355 10/21/2021 09:56 AM    Clinical ASCVD: No  The 10-year ASCVD risk score (Arnett DK, et al., 2019) is: 18.4%   Values used to calculate the score:     Age: 72 years     Sex: Female     Is Non-Hispanic African American: No     Diabetic: Yes     Tobacco smoker: No     Systolic Blood Pressure: 122 mmHg     Is BP treated: Yes     HDL Cholesterol: 55 mg/dL     Total Cholesterol: 180 mg/dL       05/31/8117    1:47 PM 08/20/2020     9:14 AM 07/29/2020    3:27 PM  Depression screen PHQ 2/9  Decreased Interest 0 1 0  Down, Depressed, Hopeless 0 1 1  PHQ - 2 Score 0 2 1  Altered sleeping  3 1  Tired, decreased energy  0 2  Change in appetite  0 1  Feeling bad or failure about yourself   0 0  Trouble concentrating  0 0  Moving slowly or fidgety/restless  0 0  Suicidal thoughts  0 0  PHQ-9 Score  5 5  Difficult doing work/chores   Somewhat difficult     Social History   Tobacco Use  Smoking Status Never  Smokeless Tobacco Never   BP Readings from Last 3 Encounters:  07/08/22 122/70  10/21/21 122/64  02/24/21 (!) 170/78   Pulse Readings from Last 3 Encounters:  07/08/22 74  10/21/21 75  02/24/21 79   Wt Readings from Last 3 Encounters:  07/08/22 172 lb 6.4 oz (78.2 kg)  10/21/21 160 lb (72.6 kg)  02/24/21 161 lb 2.5 oz (73.1 kg)   BMI Readings from Last 3 Encounters:  07/08/22 32.31 kg/m  10/21/21 31.25 kg/m  02/24/21 31.47 kg/m    Allergies  Allergen Reactions   Adhesive [Tape] Rash    "per pt - surgical tape, also causes itching   Latex Other (See Comments) and Rash    "Latex tape used during surgery - breaks my skin out"    Medications Reviewed Today     Reviewed by Sherrill Raring, RPH (Pharmacist) on 07/08/22 at 1603  Med List Status: <None>   Medication Order Taking? Sig Documenting Provider Last Dose Status Informant  amLODipine (NORVASC) 10 MG tablet [id number] Yes Take 1 tablet (10 mg total) by mouth daily. Tollie Eth, NP Taking Active   chlorthalidone (HYGROTON) 25 MG tablet [id number] Yes Take 1 tablet (25 mg total) by mouth daily. Tollie Eth, NP Taking Active   citalopram (CELEXA) 40 MG tablet [id number] Yes Take 1 tablet (40 mg total) by mouth every morning. Tollie Eth, NP Taking Active   diclofenac (VOLTAREN) 75 MG EC tablet [id number] Yes Take 1 tablet (75 mg total) by mouth 2 (two) times daily. Tollie Eth, NP Taking Active   diclofenac Sodium (VOLTAREN) 1 % GEL  [id number] Yes Apply 4 grams topically 4 (four) times daily. Early,  Sung Amabile, NP Taking Active   gabapentin (NEURONTIN) 600 MG tablet [id number] Yes TAKE ONE TABLET BY MOUTH THREE TIMES A DAY Early, Sung Amabile, NP Taking Active   losartan (COZAAR) 100 MG tablet [id number] Yes Take 1 tablet (100 mg total) by mouth daily. Tollie Eth, NP Taking Active   lovastatin (MEVACOR) 40 MG tablet [id number] Yes Take 1 tablet (40 mg total) by mouth at bedtime. Tollie Eth, NP Taking Active   omeprazole (PRILOSEC) 20 MG capsule [id number] Yes Take 1 capsule (20 mg total) by mouth every morning. Tollie Eth, NP Taking Active   SUMAtriptan (IMITREX) 100 MG tablet [id number] Yes Take 1 tablet (100 mg total) by mouth at onset of headache, may repeat one tablet in 2 hours if needed, *Max of 2 tablets in 24 hours* Early, Sung Amabile, NP Taking Active   Vitamin D, Ergocalciferol, (DRISDOL) 1.25 MG (50000 UNIT) CAPS capsule [id number] Yes Take 1 capsule (50,000 Units total) by mouth every 7 (seven) days. Early, Sung Amabile, NP Taking Active             SDOH:  (Social Determinants of Health) assessments and interventions performed: Yes SDOH Interventions    Flowsheet Row Clinical Support from 07/08/2022 in [facility]  SDOH Interventions   Food Insecurity Interventions Patient Declined  Housing Interventions Patient Declined  Transportation Interventions Intervention Not Indicated  Utilities Interventions Patient Declined  Alcohol Usage Interventions Intervention Not Indicated (Score <7)  Financial Strain Interventions Patient Declined  Physical Activity Interventions Patient Declined  Stress Interventions Patient Declined  Social Connections Interventions Intervention Not Indicated       Medication Assistance: None required.  Patient affirms current coverage meets needs.  Medication Access: Within the past 30 days, how often has patient missed a dose of medication? None Is a pillbox or other method used to  improve adherence? Yes  Factors that may affect medication adherence? no barriers identified Are meds synced by current pharmacy? No  Are meds delivered by current pharmacy? No  Does patient experience delays in picking up medications due to transportation concerns? No   [facility] Reviewed: Is patient disadvantaged to use [facility]?: Yes  Current Rx insurance plan: HTA Name and location of Current pharmacy:  [facility] [id number] - [place], [place] - [address] [place] [address] Phone: [phone] Fax: [phone]  [facility] [id number] - [place],  - [address] [place] [address] Phone: [phone] Fax: [phone]  [facility] [address] [place] [address] Phone: [phone] Fax: [phone]  [facility] [id number] - [address], [place] - [address] [place] [address] Phone: [phone] Fax: [phone]   Compliance/Adherence/Medication fill history: Care Gaps: Foot Exam - Overdue Eye Exam - Overdue Zoster Vaccine - Overdue Diabetic Urine - Overdue Colonoscopy - Overdue AWV - 03/07/22 Overdue COVID Vaccine - Overdue A1C - Overdue    Star-Rating Drugs: Losartan 100 mg - Last filled 03/25/22 30 DS at [facility] Lovastatin 40 mg - Last filled 09/03/21 90 DS at [facility] - states she has and is still taking Metformin 500 mg - Last filled 03/25/22 30 DS at [facility]   Assessment/Plan Hypertension (BP goal <130/80) -Controlled -Current treatment: Amlodipine 10mg  1 qd Appropriate, Effective, Safe, Accessible Chlorthalidone 25mg  1 qd Appropriate, Effective, Safe, Accessible Losartan 100mg  1 qd Appropriate, Effective, Safe, Accessible -  Medications previously tried: HCTZ  -Current home readings: does not have a machine to check at home -Current dietary habits:  tries to be mindful of salt intake, does have food insecurity so eats what she can provide and get -Current exercise habits: stands all day at work -Denies hypotensive/hypertensive symptoms -Educated on BP goals and benefits of medications for prevention of heart attack, stroke and kidney damage; Importance of home blood pressure monitoring; -Counseled to monitor BP at home weekly once you can obtain a cuff, document, and provide log at future appointments -Counseled on diet and exercise extensively Recommended to continue current medication  Hyperlipidemia: (LDL goal < 70) -Uncontrolled -Current treatment: Lovastatin 40mg  1 qhs Appropriate, Query effective -Medications previously tried: None  -Current dietary patterns: see above -Current exercise habits: see above -Educated on Cholesterol goals;  Benefits of statin for ASCVD risk reduction; Importance of limiting foods high in cholesterol; -Recommended updated lipid panel (performed today), if elevated, need to consider switch to higher intensity statin such as lipitor  Diabetes (A1c goal <7%) -Controlled -Current medications: Metformin XR 500mg  2 tabs qd ---not taking, stopped many months ago -Medications previously tried: None  -Current home glucose readings Not checking at home, does not have a meter -Denies hypoglycemic/hyperglycemic symptoms -Current meal patterns:  Eats breaded chicken strips and kale salads at [employer] when she is working  [employer] almost everyday for breakfast Drinks water, diet pepsi decaffeinated, 1/2 and 1/2 sweet tea, sugar free lemonade, coffee with cream and sugar in the morning -Current exercise: see above -Educated on A1c and blood sugar goals; Prevention and management of hypoglycemic episodes; Benefits of routine self-monitoring of blood sugar; -Counseled to check feet daily and get yearly eye exams -Counseled on diet and exercise extensively -Plan to collaborate with PCP on other  therapy options depending on A1C lab results (does not wish to take metformin "heard too many bad things" -Request prescription for meter and supplies for patient from PCP  Depression/Anxiety (Goal: Well-controlled mood that still allows for ADLs) -Not assessed today -Current treatment: Citalopram 40mg  1 qd Appropriate, Effective, Safe, Accessible  Osteoporosis / Osteopenia (Goal Prevent bone loss and bone fracture) -Not assessed today -Last DEXA Scan: 11/3018   Osteopenia  10-year probability of major osteoporotic fracture: <20%  10-year probability of hip fracture: <3% -Patient is not a candidate for pharmacologic treatment -Current treatment  None -Medications previously tried: None  -Recommend updated dexa scan with PCP approval  GERD (Goal: minimize symptoms of reflux ) -Not assessed today -Current treatment  Omeprazole 20mg  1 qam Appropriate, Effective, Safe, Accessible  Chronic Pain/Arthritis (Goal: Pain control that still allows for ADLs) -Not assessed today -Current treatment  Diclofenac 75mg  1 BID Appropriate, Effective, Safe, Accessible Flexeril 10mg  1 BID prn Appropriate, Effective, Safe, Accessible Voltaren gel 1% prn Appropriate, Effective, Safe, Accessible Gabapentin 600mg  1 TID Appropriate, Effective, Safe, Accessible -Referral to pain clinic was requested at PCP visit today  Migraine (Goal: Reduce and prevent migraine attacks) -Not assessed today -Current treatment  Sumatriptan 100mg  as directed Appropriate, Effective, Safe, Accessible  Query Vit D Def (Goal: Vit D WNL) -Not ideally controlled -  pending lab results drawn today  -Current treatment  Vit D 50,000 units once weekly Appropriate, Query effective   Sherrill Raring Clinical Pharmacist [phone]

## 2022-07-08 ENCOUNTER — Ambulatory Visit: Payer: PPO

## 2022-07-08 ENCOUNTER — Encounter: Payer: Self-pay | Admitting: Nurse Practitioner

## 2022-07-08 ENCOUNTER — Ambulatory Visit (INDEPENDENT_AMBULATORY_CARE_PROVIDER_SITE_OTHER): Payer: PPO | Admitting: Nurse Practitioner

## 2022-07-08 VITALS — BP 122/70 | HR 74 | Ht 61.25 in | Wt 172.4 lb

## 2022-07-08 DIAGNOSIS — Z23 Encounter for immunization: Secondary | ICD-10-CM

## 2022-07-08 DIAGNOSIS — I152 Hypertension secondary to endocrine disorders: Secondary | ICD-10-CM

## 2022-07-08 DIAGNOSIS — E7800 Pure hypercholesterolemia, unspecified: Secondary | ICD-10-CM | POA: Diagnosis not present

## 2022-07-08 DIAGNOSIS — M8580 Other specified disorders of bone density and structure, unspecified site: Secondary | ICD-10-CM

## 2022-07-08 DIAGNOSIS — M25561 Pain in right knee: Secondary | ICD-10-CM | POA: Diagnosis not present

## 2022-07-08 DIAGNOSIS — E1169 Type 2 diabetes mellitus with other specified complication: Secondary | ICD-10-CM

## 2022-07-08 DIAGNOSIS — E785 Hyperlipidemia, unspecified: Secondary | ICD-10-CM

## 2022-07-08 DIAGNOSIS — G894 Chronic pain syndrome: Secondary | ICD-10-CM

## 2022-07-08 DIAGNOSIS — Z0000 Encounter for general adult medical examination without abnormal findings: Secondary | ICD-10-CM

## 2022-07-08 DIAGNOSIS — E1159 Type 2 diabetes mellitus with other circulatory complications: Secondary | ICD-10-CM

## 2022-07-08 DIAGNOSIS — G8929 Other chronic pain: Secondary | ICD-10-CM

## 2022-07-08 LAB — LIPID PANEL

## 2022-07-08 NOTE — Patient Instructions (Addendum)
COVID vaccine given today.   In about 2-3 weeks you can get your RSV shot and then 2-3 weeks later you can get your shingles shot.   Check out local churches for [facility].

## 2022-07-08 NOTE — Progress Notes (Signed)
Primary Care & Sports Medicine [facility] [address]  [address] [place], [place]  [address] [phone]   MEDICARE F[place] Malling VISIT  07/22/2022  Subjective:  Madison Collins is a 72 y.o. female patient of Faron Tudisco, Sung Amabile, NP who had a Medicare Annual Wellness Visit today. Madison Collins is Working full time and lives with their family and is raising her  grandchildren. she reports that she is socially active and does interact with friends/family regularly. she is moderately physically active and enjoys caring for her grandchildren.   HPI: Madison Collins presents today with significant pain in her knees, back, elbows, and wrists, which she attributes to arthritis. She reports that her knee is deteriorating and will require a knee replacement surgery soon, a change from her initial expectation of a less invasive procedure. Madison Collins works at [employer], where her duties involve extensive standing and walking, which exacerbates her pain. She describes the pain as constant and notes that it affects her mood .Madison Collins expresses a desire to manage her pain more effectively and inquires about a referral to a pain clinic. She mentions past use of pain medication, which she handled responsibly, and her current need for effective pain management to continue functioning in her daily life.  Regarding her diabetes management, Madison Collins does not monitor her blood sugar levels but tries to manage her diet by avoiding excessive sugar and choosing snacks that combine protein with satisfaction, such as yogurt, fruit, and tuna packets. However, she expresses uncertainty about the effectiveness of her dietary approach due to her lack of blood sugar monitoring.  Madison Collins also discusses her vision issues, suspecting that her blurry vision might be due to cloudiness on the lens implants from previous cataract surgery. She has not yet sought medical evaluation for this issue due to  her eye doctor's retirement and has not arranged an alternative provider.  Madison Collins expresses concerns about her financial situation, particularly in relation to upcoming medical expenses like her knee replacement surgery. She mentions losing food stamps now that her income has increased slightly, which complicates her financial planning. Additionally, she recounts a recent significant loss of stored food due to an accident, which has impacted her ability to prepare for post-surgery recovery.  She shares the challenges of raising her grandchildren due to her daughter's heroin addiction, which affects the entire family. Madison Collins feels overwhelmed by the responsibilities of caring for her family under these stressful conditions. Patient Care Team: Daysha Ashmore, Sung Amabile, NP as PCP - General (Nurse Practitioner) Priscille Kluver Cresenciano Genre, MD (Inactive) (Orthopedic Surgery) Sherrill Raring, [place] (Pharmacist)     07/08/2022    2:32 PM 02/24/2021   12:08 PM 08/18/2020    3:17 PM 03/17/2020    4:54 PM 03/08/2019   11:05 AM 04/06/2015   12:32 PM 12/12/2013   10:11 AM  Advanced Directives  Does Patient Have a Medical Advance Directive? No No No No No No No  Type of Advance Directive     Healthcare Power of Attorney    Does patient want to make changes to medical advance directive?     Yes (ED - Information included in AVS)    Would patient like information on creating a medical advance directive? No - Patient declined No - Patient declined No - Patient declined No - Patient declined No - Patient declined No - patient declined information Yes - Educational materials given    Hospital Utilization Over the Past 12 Months: # of hospitalizations or ER  visits: 0 # of surgeries: 0  Review of Systems    Patient reports that her overall health is worse when compared to last year.  Review of Systems: History obtained from the patient Musculoskeletal ROS: positive for - chronic pain  All other systems negative.  Pain  Assessment  8/10     Current Medications & Allergies (verified) Allergies as of 07/08/2022       Reactions   Adhesive [tape] Rash   "per pt - surgical tape, also causes itching   Latex Other (See Comments), Rash   "Latex tape used during surgery - breaks my skin out"        Medication List        Accurate as of Jul 08, 2022 11:59 PM. If you have any questions, ask your nurse or doctor.          STOP taking these medications    cyclobenzaprine 10 MG tablet Commonly known as: FLEXERIL Stopped by: Tollie Eth, NP   fluticasone 50 MCG/ACT nasal spray Commonly known as: FLONASE Stopped by: Tollie Eth, NP   metFORMIN 500 MG 24 hr tablet Commonly known as: GLUCOPHAGE-XR Stopped by: Tollie Eth, NP   Pfizer COVID-19 Vac Bivalent injection Generic drug: COVID-19 mRNA bivalent vaccine Proofreader) Stopped by: Tollie Eth, NP       TAKE these medications    amLODipine 10 MG tablet Commonly known as: NORVASC Take 1 tablet (10 mg total) by mouth daily.   chlorthalidone 25 MG tablet Commonly known as: HYGROTON Take 1 tablet (25 mg total) by mouth daily.   citalopram 40 MG tablet Commonly known as: CELEXA Take 1 tablet (40 mg total) by mouth every morning.   diclofenac 75 MG EC tablet Commonly known as: VOLTAREN Take 1 tablet (75 mg total) by mouth 2 (two) times daily.   diclofenac Sodium 1 % Gel Commonly known as: VOLTAREN Apply 4 grams topically 4 (four) times daily.   gabapentin 600 MG tablet Commonly known as: NEURONTIN TAKE ONE TABLET BY MOUTH THREE TIMES A DAY   losartan 100 MG tablet Commonly known as: COZAAR Take 1 tablet (100 mg total) by mouth daily.   lovastatin 40 MG tablet Commonly known as: MEVACOR Take 1 tablet (40 mg total) by mouth at bedtime.   omeprazole 20 MG capsule Commonly known as: PRILOSEC Take 1 capsule (20 mg total) by mouth every morning.   SUMAtriptan 100 MG tablet Commonly known as: IMITREX Take 1 tablet (100  mg total) by mouth at onset of headache, may repeat one tablet in 2 hours if needed, *Max of 2 tablets in 24 hours*   Vitamin D (Ergocalciferol) 1.25 MG (50000 UNIT) Caps capsule Commonly known as: DRISDOL Take 1 capsule (50,000 Units total) by mouth every 7 (seven) days.        History (reviewed): Past Medical History:  Diagnosis Date   Anxiety    Anxiety and depression    Arthritis    Bulging disc 1990's   c4-5   Cataract    right eye   Class 1 obesity due to excess calories without serious comorbidity with body mass index (BMI) of 30.0 to 30.9 in adult 08/05/2016   Depression    Encounter to establish care 07/29/2020   GERD (gastroesophageal reflux disease)    H/O: osteoarthritis    Headache    migraines occasionally   Hepatitis C    IA viral load low 02/09    BX past mild fibrosis  Hypertension    Insomnia, unspecified    Knee pain    Post-menopause 2010   Past Surgical History:  Procedure Laterality Date   BREAST BIOPSY Left    BUNIONECTOMY Right 1983   COLONOSCOPY  10/22/13   EYE SURGERY Right 2010   hole in macula   FRACTURE SURGERY N/A    Phreesia 04/14/2020   JOINT REPLACEMENT N/A    Phreesia 04/14/2020   KNEE SURGERY Right     x4   ORIF ELBOW FRACTURE Right 04/08/2015   Procedure: OPEN REDUCTION INTERNAL FIXATION (ORIF) ELBOW/OLECRANON FRACTURE;  Surgeon: Mack Hook, MD;  Location[facility] OR;  Service: Orthopedics;  Laterality: Right;   ORIF RADIAL FRACTURE Right 04/08/2015   Procedure: OPEN REDUCTION INTERNAL FIXATION (ORIF) RADIAL HEAD ;  Surgeon: Mack Hook, MD;  Location: [facility] OR;  Service: Orthopedics;  Laterality: Right;   OVARIAN CYST REMOVAL     TOTAL KNEE ARTHROPLASTY Right 12/24/2013   Procedure: RIGHT TOTAL KNEE ARTHROPLASTY;  Surgeon: Nestor Lewandowsky, MD;  Location: [facility] OR;  Service: Orthopedics;  Laterality: Right;   TUBAL LIGATION  1987   Family History  Problem Relation Age of Onset   Cancer Mother        Lung with brain mets   Diabetes  Mother    Stroke Father 23   Diabetes Father    Colon cancer Father 8       colorectal/anal cancer   Hypertension Brother    Bipolar disorder Daughter    Bipolar disorder Son    Diabetes Maternal Grandfather    Heart disease Maternal Grandfather    Emphysema Paternal Grandmother    Cancer Paternal Grandfather        stomach   Breast cancer Maternal Aunt    Social History   Socioeconomic History   Marital status: Widowed    Spouse name: Not on file   Number of children: Not on file   Years of education: Not on file   Highest education level: Not on file  Occupational History   Occupation: Corporate investment banker  Tobacco Use   Smoking status: Never   Smokeless tobacco: Never  Vaping Use   Vaping Use: Never used  Substance and Sexual Activity   Alcohol use: Yes    Comment: a glass of wine/rarely   Drug use: Yes    Comment: Heroin on 03/17/2020   Sexual activity: Yes    Birth control/protection: Post-menopausal  Other Topics Concern   Not on file  Social History Narrative   Marital status: Widower since 2009 from Hepatitis C; + Significant other.       Children:  2 children; 5 grandchildren; no gg.      Lives: with significant Beaulah Corin).      Employment: [employer] 3 hours per day Monday-Friday      Tobacco: never      Alcohol: special occasions      Drugs: none     Education:  Automotive engineer.       Exercise: no formal exercise; very busy with grandchildren and work.   Social Determinants of Health   Financial Resource Strain: Medium Risk (07/08/2022)   Overall Financial Resource Strain (CARDIA)    Difficulty of Paying Living Expenses: Somewhat hard  Food Insecurity: Food Insecurity Present (07/08/2022)   Hunger Vital Sign    Worried About Running Out of Food in the Last Year: Often true    Ran Out of Food in the Last Year: Sometimes true  Transportation Needs: No  Transportation Needs (07/08/2022)   PRAPARE - Administrator, Civil Service (Medical): No    Lack  of Transportation (Non-Medical): No  Physical Activity: Insufficiently Active (07/08/2022)   Exercise Vital Sign    Days of Exercise per Week: 3 days    Minutes of Exercise per Session: 30 min  Stress: Stress Concern Present (07/08/2022)   [employer] of Occupational Health - Occupational Stress Questionnaire    Feeling of Stress : To some extent  Social Connections: Moderately Integrated (07/08/2022)   Social Connection and Isolation Panel [NHANES]    Frequency of Communication with Friends and Family: More than three times a week    Frequency of Social Gatherings with Friends and Family: More than three times a week    Attends Religious Services: 1 to 4 times per year    Active Member of [employer] or Organizations: No    Attends Engineer, structural: More than 4 times per year    Marital Status: Divorced    Activities of Daily Living    07/08/2022    2:32 PM  In your present state of health, do you have any difficulty performing the following activities:  Hearing? 1  Comment little bit  Vision? 0  Difficulty concentrating or making decisions? 0  Walking or climbing stairs? 1  Dressing or bathing? 0  Doing errands, shopping? 0  Preparing Food and eating ? N  Using the Toilet? N  In the past six months, have you accidently leaked urine? N  Do you have problems with loss of bowel control? N  Managing your Medications? N  Managing your Finances? N  Housekeeping or managing your Housekeeping? N    Patient Education/Literacy  No concerns  Exercise Current Exercise Habits: The patient does not participate in regular exercise at present, Exercise limited by: Other - see comments;orthopedic condition(s)  Diet Patient reports consuming 2 meals a day and 1 snack(s) a day Patient reports that her primary diet is: Regular Patient reports that she does not have regular access to food.   Depression Screen    07/08/2022    2:30 PM 08/20/2020    9:14 AM 07/29/2020    3:27  PM 04/15/2020   10:16 AM 03/24/2020    2:43 PM 01/29/2020   10:46 AM 07/10/2019   11:39 AM  PHQ 2/9 Scores  PHQ - 2 Score 0 2 1 0 0 0 1  PHQ- 9 Score  5 5    4      Fall Risk    07/08/2022    2:30 PM 10/21/2021    9:05 AM 08/20/2020    9:14 AM 07/29/2020    3:27 PM 04/15/2020   10:15 AM  Fall Risk   Falls in the past year? 0 1 0 1 1  Number falls in past yr: 0 1  0 0  Injury with Fall? 0 1  1 1   Risk for fall due to : No Fall Risks Orthopedic patient History of fall(s) No Fall Risks   Follow up Falls evaluation completed Falls evaluation completed;Education provided  Falls evaluation completed Falls evaluation completed     Objective:   BP 122/70   Pulse 74   Ht 5' 1.25" (1.556 m)   Wt 172 lb 6.4 oz (78.2 kg)   BMI 32.31 kg/m   Last Weight  Most recent update: 07/08/2022  2:39 PM    Weight  78.2 kg (172 lb 6.4 oz)  Body mass index is 32.31 kg/m.  Hearing/Vision  Madison Collins did not have difficulty with hearing/understanding during the face-to-face interview Madison Collins did not have difficulty with her vision during the face-to-face interview Reports that she has not had a formal eye exam by an eye care professional within the past year Reports that she has not had a formal hearing evaluation within the past year  Cognitive Function:    03/08/2019   11:05 AM  6CIT Screen  What Year? 0 points  What month? 0 points  What time? 0 points  Count back from 20 0 points  Months in reverse 0 points  Repeat phrase 0 points  Total Score 0 points    Normal Cognitive Function Screening: Yes (Normal:0-7, Significant for Dysfunction: >8)  Immunization & Health Maintenance Record Immunization History  Administered Date(s) Administered   COVID-19, mRNA, vaccine(Comirnaty)12 years and older 07/08/2022   Fluad Quad(high Dose 65+) 01/29/2020, 01/28/2021   Hepatitis B, ADULT 06/04/2013, 07/03/2013   Influenza Split 11/25/2010, 11/17/2011, 04/02/2015   Influenza, High Dose  Seasonal PF 01/06/2018, 12/13/2018   Influenza,inj,Quad PF,6+ Mos 11/08/2012, 04/02/2015, 04/21/2016, 11/02/2016   Influenza-Unspecified 11/05/2013   PFIZER Comirnaty(Gray Top)Covid-19 Tri-Sucrose Vaccine 04/13/2021   PFIZER(Purple Top)SARS-COV-2 Vaccination 06/27/2019, 08/24/2019   Pneumococcal Conjugate-13 10/24/2006, 01/16/2019   Pneumococcal Polysaccharide-23 12/26/2013, 01/29/2020   Td 02/23/2007   Tdap 02/23/2007, 03/02/2017   Zoster Recombinat (Shingrix) 06/22/2016   Zoster, Live 09/22/2013    Health Maintenance  Topic Date Due   OPHTHALMOLOGY EXAM  Never done   Zoster Vaccines- Shingrix (2 of 2) 08/17/2016   Diabetic kidney evaluation - Urine ACR  06/16/2018   Colonoscopy  10/23/2018   MAMMOGRAM  03/28/2021   INFLUENZA VACCINE  09/23/2022   HEMOGLOBIN A1C  01/08/2023   Diabetic kidney evaluation - eGFR measurement  07/08/2023   FOOT EXAM  07/08/2023   Medicare Annual Wellness (AWV)  07/08/2023   DTaP/Tdap/Td (4 - Td or Tdap) 03/03/2027   Pneumonia Vaccine 34+ Years old  Completed   DEXA SCAN  Completed   COVID-19 Vaccine  Completed   Hepatitis C Screening  Completed   HPV VACCINES  Aged Out       Assessment  This is a routine wellness examination for Madison Collins.  Health Maintenance: Due or Overdue Health Maintenance Due  Topic Date Due   OPHTHALMOLOGY EXAM  Never done   Zoster Vaccines- Shingrix (2 of 2) 08/17/2016   Diabetic kidney evaluation - Urine ACR  06/16/2018   Colonoscopy  10/23/2018   MAMMOGRAM  03/28/2021    Donnetta Simpers needs a referral for Community Assistance: Care Management:   She declines Social Work:    She declines Prescription Assistance:  completed Nutrition/Diabetes Education:  no   Plan:  Personalized Goals  Goals Addressed               This Visit's Progress     Patient Stated (pt-stated)        I would like to loose 10 pounds.        Personalized Health Maintenance & Screening  Recommendations  Colorectal cancer screening Diabetes screening Shingles vaccine, #2, RSV vaccine, COVID vaccine  Lung Cancer Screening Recommended: not applicable (Low Dose CT Chest recommended if Age 52-80 years, 30 pack-year currently smoking OR have quit w/in past 15 years) Hepatitis C Screening recommended: not applicable HIV Screening recommended: not applicable  Advanced Directives: Written information was not given per the patient's request.  Referrals & Orders Orders Placed This Encounter  Procedures   Pfizer Fall 2023 Covid-19 Vaccine 67yrs and older   Hemoglobin A1c   CBC with Differential/Platelet   Comprehensive metabolic panel   VITAMIN D 25 Hydroxy (Vit-D Deficiency, Fractures)   Lipid panel   Ambulatory referral to Pain Clinic   POCT UA - Microalbumin   HM Diabetes Foot Exam    Follow-up Plan Follow-up with Baileigh Modisette, Sung Amabile, NP as planned Schedule eye appt Follow-up with pain management   I have personally reviewed and noted the following in the patient's chart:   Medical and social history Use of alcohol, tobacco or illicit drugs  Current medications and supplements Functional ability and status Nutritional status Physical activity Advanced directives List of other physicians Hospitalizations, surgeries, and ER visits in previous 12 months Vitals Screenings to include cognitive, depression, and falls Referrals and appointments  In addition, I have reviewed and discussed with patient certain preventive protocols, quality metrics, and best practice recommendations. A written personalized care plan for preventive services as well as general preventive health recommendations were provided to patient.     Tollie Eth, DNP, AGNP-c   07/22/2022

## 2022-07-09 LAB — COMPREHENSIVE METABOLIC PANEL
ALT: 14 IU/L (ref 0–32)
AST: 13 IU/L (ref 0–40)
Albumin/Globulin Ratio: 1.7 (ref 1.2–2.2)
Albumin: 4.3 g/dL (ref 3.9–4.9)
Alkaline Phosphatase: 94 IU/L (ref 44–121)
BUN/Creatinine Ratio: 38 — ABNORMAL HIGH (ref 12–28)
BUN: 34 mg/dL — ABNORMAL HIGH (ref 8–27)
Bilirubin Total: 0.2 mg/dL (ref 0.0–1.2)
CO2: 22 mmol/L (ref 20–29)
Calcium: 9.4 mg/dL (ref 8.7–10.3)
Chloride: 104 mmol/L (ref 96–106)
Creatinine, Ser: 0.89 mg/dL (ref 0.57–1.00)
Globulin, Total: 2.5 g/dL (ref 1.5–4.5)
Glucose: 117 mg/dL — ABNORMAL HIGH (ref 70–99)
Potassium: 4.5 mmol/L (ref 3.5–5.2)
Sodium: 140 mmol/L (ref 134–144)
Total Protein: 6.8 g/dL (ref 6.0–8.5)
eGFR: 70 mL/min/{1.73_m2} (ref >59)

## 2022-07-09 LAB — VITAMIN D 25 HYDROXY (VIT D DEFICIENCY, FRACTURES): Vit D, 25-Hydroxy: 45.1 ng/mL (ref 30.0–100.0)

## 2022-07-09 LAB — CBC WITH DIFFERENTIAL/PLATELET
Basophils Absolute: 0.1 10*3/uL (ref 0.0–0.2)
Basos: 1 %
EOS (ABSOLUTE): 0.2 10*3/uL (ref 0.0–0.4)
Eos: 2 %
Hematocrit: 36.9 % (ref 34.0–46.6)
Hemoglobin: 12.2 g/dL (ref 11.1–15.9)
Immature Grans (Abs): 0 10*3/uL (ref 0.0–0.1)
Immature Granulocytes: 0 %
Lymphocytes Absolute: 2.6 10*3/uL (ref 0.7–3.1)
Lymphs: 36 %
MCH: 29 pg (ref 26.6–33.0)
MCHC: 33.1 g/dL (ref 31.5–35.7)
MCV: 88 fL (ref 79–97)
Monocytes Absolute: 0.5 10*3/uL (ref 0.1–0.9)
Monocytes: 7 %
Neutrophils Absolute: 3.8 10*3/uL (ref 1.4–7.0)
Neutrophils: 54 %
Platelets: 294 10*3/uL (ref 150–450)
RBC: 4.2 x10E6/uL (ref 3.77–5.28)
RDW: 13.9 % (ref 11.7–15.4)
WBC: 7.1 10*3/uL (ref 3.4–10.8)

## 2022-07-09 LAB — HEMOGLOBIN A1C
Est. average glucose Bld gHb Est-mCnc: 146 mg/dL
Hgb A1c MFr Bld: 6.7 % — ABNORMAL HIGH (ref 4.8–5.6)

## 2022-07-09 LAB — LIPID PANEL
HDL: 58 mg/dL (ref >39)
Triglycerides: 135 mg/dL (ref 0–149)
VLDL Cholesterol Cal: 24 mg/dL (ref 5–40)

## 2022-07-22 NOTE — Assessment & Plan Note (Signed)
Chronic and well controlled with amlodipine, chlorthalidone, and losartan. No alarm symptoms. Plan: - Continue your current medications - Labs pending

## 2022-07-22 NOTE — Assessment & Plan Note (Addendum)
Chronic diabetes. She has stopped taking metformin due to concerns about safety that she encountered online. She is not currently taking anything for BG control and is not monitoring her BG levels.  Plan: - Assess A1c today to determine medication management that may be most effective.  -Recommend eye exam as soon as possible, consider vision screening in August in our office for retinopathy if not seen before this time.

## 2022-07-22 NOTE — Assessment & Plan Note (Signed)
Chronic. Currently managed with lovastatin. No concerns.  Plan: - Labs pending - Continue current medications.

## 2022-07-22 NOTE — Assessment & Plan Note (Signed)
Patient reports chronic pain in her knees, back, elbows, and wrist, exacerbated by her job requirements which involve extensive standing and walking. She has been diagnosed with severe arthritis in her knee, necessitating a future knee replacement. Her pain is constant and affects her mood and functionality. Plan: - A referral to a pain clinic has been initiated to address her chronic pain issues. - The clinic may utilize nonnarcotic pain management measures that have been shown to be helpful. - Patient will be contacted by the clinic to schedule an appointment.

## 2022-08-10 DIAGNOSIS — H109 Unspecified conjunctivitis: Secondary | ICD-10-CM | POA: Diagnosis not present

## 2022-08-11 ENCOUNTER — Other Ambulatory Visit: Payer: Self-pay | Admitting: Nurse Practitioner

## 2022-08-11 ENCOUNTER — Ambulatory Visit
Admission: RE | Admit: 2022-08-11 | Discharge: 2022-08-11 | Disposition: A | Discharge status: Home / Self Care | Payer: PPO | Source: Ambulatory Visit | Attending: Nurse Practitioner | Admitting: Nurse Practitioner

## 2022-08-11 DIAGNOSIS — M25561 Pain in right knee: Secondary | ICD-10-CM | POA: Diagnosis not present

## 2022-08-11 DIAGNOSIS — M542 Cervicalgia: Secondary | ICD-10-CM

## 2022-08-11 DIAGNOSIS — Z79891 Long term (current) use of opiate analgesic: Secondary | ICD-10-CM | POA: Diagnosis not present

## 2022-08-11 DIAGNOSIS — Z96651 Presence of right artificial knee joint: Secondary | ICD-10-CM | POA: Diagnosis not present

## 2022-08-11 DIAGNOSIS — M25562 Pain in left knee: Secondary | ICD-10-CM | POA: Diagnosis not present

## 2022-08-11 DIAGNOSIS — M545 Low back pain, unspecified: Secondary | ICD-10-CM

## 2022-08-11 DIAGNOSIS — M5416 Radiculopathy, lumbar region: Secondary | ICD-10-CM | POA: Diagnosis not present

## 2022-08-11 DIAGNOSIS — G8929 Other chronic pain: Secondary | ICD-10-CM | POA: Diagnosis not present

## 2022-08-11 DIAGNOSIS — M5412 Radiculopathy, cervical region: Secondary | ICD-10-CM | POA: Diagnosis not present

## 2022-08-23 DIAGNOSIS — S62125A Nondisplaced fracture of lunate [semilunar], left wrist, initial encounter for closed fracture: Secondary | ICD-10-CM | POA: Diagnosis not present

## 2022-08-23 DIAGNOSIS — X501XXA Overexertion from prolonged static or awkward postures, initial encounter: Secondary | ICD-10-CM | POA: Diagnosis not present

## 2022-08-23 DIAGNOSIS — S63502A Unspecified sprain of left wrist, initial encounter: Secondary | ICD-10-CM | POA: Diagnosis not present

## 2022-09-08 DIAGNOSIS — M47816 Spondylosis without myelopathy or radiculopathy, lumbar region: Secondary | ICD-10-CM | POA: Diagnosis not present

## 2022-09-08 DIAGNOSIS — M25532 Pain in left wrist: Secondary | ICD-10-CM | POA: Diagnosis not present

## 2022-09-08 DIAGNOSIS — M542 Cervicalgia: Secondary | ICD-10-CM | POA: Diagnosis not present

## 2022-09-08 DIAGNOSIS — Z79891 Long term (current) use of opiate analgesic: Secondary | ICD-10-CM | POA: Diagnosis not present

## 2022-09-08 DIAGNOSIS — G8929 Other chronic pain: Secondary | ICD-10-CM | POA: Diagnosis not present

## 2022-09-08 DIAGNOSIS — M47812 Spondylosis without myelopathy or radiculopathy, cervical region: Secondary | ICD-10-CM | POA: Diagnosis not present

## 2022-09-08 DIAGNOSIS — M25562 Pain in left knee: Secondary | ICD-10-CM | POA: Diagnosis not present

## 2022-09-08 DIAGNOSIS — Z96651 Presence of right artificial knee joint: Secondary | ICD-10-CM | POA: Diagnosis not present

## 2022-09-08 DIAGNOSIS — M545 Low back pain, unspecified: Secondary | ICD-10-CM | POA: Diagnosis not present

## 2022-10-07 DIAGNOSIS — G8929 Other chronic pain: Secondary | ICD-10-CM | POA: Diagnosis not present

## 2022-10-07 DIAGNOSIS — Z79891 Long term (current) use of opiate analgesic: Secondary | ICD-10-CM | POA: Diagnosis not present

## 2022-10-07 DIAGNOSIS — M545 Low back pain, unspecified: Secondary | ICD-10-CM | POA: Diagnosis not present

## 2022-10-07 DIAGNOSIS — Z96651 Presence of right artificial knee joint: Secondary | ICD-10-CM | POA: Diagnosis not present

## 2022-10-07 DIAGNOSIS — M25562 Pain in left knee: Secondary | ICD-10-CM | POA: Diagnosis not present

## 2022-10-07 DIAGNOSIS — M47816 Spondylosis without myelopathy or radiculopathy, lumbar region: Secondary | ICD-10-CM | POA: Diagnosis not present

## 2022-10-19 ENCOUNTER — Other Ambulatory Visit: Payer: Self-pay | Admitting: Nurse Practitioner

## 2022-10-22 ENCOUNTER — Other Ambulatory Visit: Payer: Self-pay | Admitting: Nurse Practitioner

## 2022-10-22 DIAGNOSIS — G894 Chronic pain syndrome: Secondary | ICD-10-CM

## 2022-10-22 DIAGNOSIS — F418 Other specified anxiety disorders: Secondary | ICD-10-CM

## 2022-11-15 ENCOUNTER — Other Ambulatory Visit: Payer: Self-pay | Admitting: Nurse Practitioner

## 2022-11-15 DIAGNOSIS — E559 Vitamin D deficiency, unspecified: Secondary | ICD-10-CM

## 2022-11-15 NOTE — Telephone Encounter (Signed)
Last apt 07/08/22

## 2022-12-03 ENCOUNTER — Encounter: Payer: PPO | Admitting: Nurse Practitioner

## 2022-12-07 ENCOUNTER — Other Ambulatory Visit: Payer: Self-pay | Admitting: Nurse Practitioner

## 2022-12-07 DIAGNOSIS — G894 Chronic pain syndrome: Secondary | ICD-10-CM

## 2022-12-09 ENCOUNTER — Other Ambulatory Visit: Payer: Self-pay | Admitting: Nurse Practitioner

## 2022-12-09 DIAGNOSIS — K219 Gastro-esophageal reflux disease without esophagitis: Secondary | ICD-10-CM

## 2022-12-09 DIAGNOSIS — G894 Chronic pain syndrome: Secondary | ICD-10-CM

## 2022-12-09 DIAGNOSIS — E1159 Type 2 diabetes mellitus with other circulatory complications: Secondary | ICD-10-CM

## 2022-12-17 ENCOUNTER — Encounter: Payer: Self-pay | Admitting: Nurse Practitioner

## 2022-12-17 ENCOUNTER — Ambulatory Visit (INDEPENDENT_AMBULATORY_CARE_PROVIDER_SITE_OTHER): Payer: PPO | Admitting: Nurse Practitioner

## 2022-12-17 VITALS — BP 124/78 | HR 73 | Wt 172.8 lb

## 2022-12-17 DIAGNOSIS — E1159 Type 2 diabetes mellitus with other circulatory complications: Secondary | ICD-10-CM | POA: Diagnosis not present

## 2022-12-17 DIAGNOSIS — E66811 Obesity, class 1: Secondary | ICD-10-CM

## 2022-12-17 DIAGNOSIS — Z23 Encounter for immunization: Secondary | ICD-10-CM

## 2022-12-17 DIAGNOSIS — I152 Hypertension secondary to endocrine disorders: Secondary | ICD-10-CM

## 2022-12-17 DIAGNOSIS — G894 Chronic pain syndrome: Secondary | ICD-10-CM

## 2022-12-17 DIAGNOSIS — E6609 Other obesity due to excess calories: Secondary | ICD-10-CM

## 2022-12-17 DIAGNOSIS — M25562 Pain in left knee: Secondary | ICD-10-CM | POA: Diagnosis not present

## 2022-12-17 DIAGNOSIS — F418 Other specified anxiety disorders: Secondary | ICD-10-CM | POA: Diagnosis not present

## 2022-12-17 DIAGNOSIS — E1165 Type 2 diabetes mellitus with hyperglycemia: Secondary | ICD-10-CM

## 2022-12-17 DIAGNOSIS — E785 Hyperlipidemia, unspecified: Secondary | ICD-10-CM | POA: Diagnosis not present

## 2022-12-17 DIAGNOSIS — E1169 Type 2 diabetes mellitus with other specified complication: Secondary | ICD-10-CM

## 2022-12-17 DIAGNOSIS — G8929 Other chronic pain: Secondary | ICD-10-CM

## 2022-12-17 DIAGNOSIS — M25561 Pain in right knee: Secondary | ICD-10-CM | POA: Diagnosis not present

## 2022-12-17 MED ORDER — GABAPENTIN 800 MG PO TABS
800.0000 mg | ORAL_TABLET | Freq: Three times a day (TID) | ORAL | 1 refills | Status: DC
Start: 2022-12-17 — End: 2023-06-13

## 2022-12-17 NOTE — Progress Notes (Signed)
Madison Clamp, DNP, AGNP-c [facility]  [address] [place], [place] [address] [phone]  ESTABLISHED PATIENT- Chronic Health and/or Follow-Up Visit  Blood pressure 124/78, pulse 73, weight 172 lb 12.8 oz (78.4 kg).    Madison Collins is a 72 y.o. year old female presenting today for evaluation and management of chronic conditions.   History of Present Illness Madison Collins presents with a history of chronic pain related to degenerative disc disease and osteoarthritis, particularly affecting the knees and back. She reports having had one knee replacement approximately ten years ago, but the other knee now requires replacement due to a deteriorating kneecap. However, financial constraints and the need to continue working to support her family have prevented her from pursuing this surgery.   She is currently raising her 4 grandchildren, but her daughter has recently come back into their lives after completing an extensive treatment for addiction. Madison Collins tells me her daughter is helping with responsibilities around the house and with the children.   Her back pain, attributed to degenerative disc disease, is described as constant and sometimes so severe that it impedes walking. She was approved for a procedure involving injections into the back, but again, financial constraints have prevented her from receiving this treatment. 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  She also reports numbness in her hands, particularly after long periods of holding items at work. This numbness is transient and resolves upon shaking out the hands. She attributes this symptom to disc issues in the neck, which is also chronic in nature.   Madison Collins's pain management has been challenging. She reports that tramadol, prescribed by a pain clinic,  has not been effective. She also mentions financial difficulties in affording the copay for the pain clinic after this increased to $50. The patient expresses frustration with the restrictions on pain medication prescriptions due to concerns about addiction. She mentions being on a previous medication for pain that was effective, but it is not known what medication this was. We discussed that increase of gabapentin may aid in the pain management.  The patient's other significant medical history includes a history of cataracts, for which she has had surgery, and a hole in the macula of one eye, which has also been surgically repaired. She expresses concern about potential debris accumulating over the lens of the eye that has had cataract surgery.   She expresses a positive outlook and a determination to manage her health issues as best she can within her financial constraints.  She reports that she has received her RSV shot and both of her shingles vaccines.   All ROS negative with exception of what is listed above.   PHYSICAL EXAM Physical Exam Vitals and nursing note reviewed.  Constitutional:      Appearance: Normal appearance.  HENT:     Head: Normocephalic.  Eyes:     Conjunctiva/sclera: Conjunctivae normal.     Pupils: Pupils are equal, round, and reactive to light.  Neck:     Vascular: No carotid bruit.  Cardiovascular:     Rate and Rhythm: Normal rate and regular rhythm.     Pulses: Normal pulses.     Heart sounds: Normal heart sounds.  Pulmonary:     Effort: Pulmonary effort is normal.     Breath sounds: Normal breath sounds.  Musculoskeletal:        General: Normal  range of motion.     Cervical back: Normal range of motion.     Right lower leg: No edema.     Left lower leg: No edema.  Skin:    General: Skin is warm.     Capillary Refill: Capillary refill takes less than 2 seconds.  Neurological:     General: No focal deficit present.     Mental Status: She is alert  and oriented to person, place, and time.  Psychiatric:        Mood and Affect: Mood normal.      PLAN Problem List Items Addressed This Visit     Chronic knee pain (Chronic)    History of knee replacement on right, left knee now symptomatic with deteriorating kneecap. Patient unable to afford time off work for surgery at this time.  -Continue with diclofenac for inflammation. -Consider a supportive knee brace to help with stabilization.      Relevant Medications   gabapentin (NEURONTIN) 800 MG tablet   traMADol (ULTRAM) 50 MG tablet   Hypertension associated with diabetes (HCC)    Chronic and well controlled with amlodipine, chlorthalidone, and losartan. No alarm symptoms. Labs pending Plan: - Continue your current medications       Relevant Orders   Hemoglobin A1c   CBC with Differential/Platelet   Comprehensive metabolic panel   Microalbumin / creatinine urine ratio   Depression with anxiety    Chronic with good control on citalopram.       Class 1 obesity due to excess calories without serious comorbidity with body mass index (BMI) of 30.0 to 30.9 in adult    Chronic. Exercise limited due to chronic pain.She is on her feet for several hours a day at work. Aquatic exercise would be ideal. She may consider looking into community resources to see if there are programs that may allow her and the children to join for free or at a discounted rate.  I recommend continue walking and diet/exercise modification.       Chronic pain syndrome    Chronic low back and neck pain followed by pain management. She reports some improvement of pain with diclofenac and gabapentin, but the Tramadol written by pain management is ineffective. Given the increased cost of her co-pay, she has elected to stop going to pain management at this time. She is aware I am unable to provide narcotic pain management for her, but we discussed the option of increasing her gabapentin slightly to see if this is  helpful. She is in agreement to this.  -Increase gabapentin to 800mg . OK to take 600-800mg  up to three times a day for pain.       Relevant Medications   gabapentin (NEURONTIN) 800 MG tablet   traMADol (ULTRAM) 50 MG tablet   Hyperlipidemia associated with type 2 diabetes mellitus (HCC)    Chronic. Currently managed with lovastatin. No concerns.  Labs pending - Continue current medications.       Relevant Orders   Hemoglobin A1c   CBC with Differential/Platelet   Comprehensive metabolic panel   Microalbumin / creatinine urine ratio   Diabetes (HCC) - Primary    Chronic. Previous control stable. She is managing at this time with diet. We will monitor labs today to ensure she remains in good control.      Relevant Orders   Hemoglobin A1c   CBC with Differential/Platelet   Comprehensive metabolic panel   Microalbumin / creatinine urine ratio   Other Visit Diagnoses  Need for influenza vaccination       Relevant Orders   Flu Vaccine Trivalent High Dose (Fluad) (Completed)       Return in about 6 months (around 06/17/2023) for AWV + CPE.  Madison Clamp, DNP, AGNP-c

## 2022-12-17 NOTE — Assessment & Plan Note (Signed)
Chronic. Exercise limited due to chronic pain.She is on her feet for several hours a day at work. Aquatic exercise would be ideal. She may consider looking into community resources to see if there are programs that may allow her and the children to join for free or at a discounted rate.  I recommend continue walking and diet/exercise modification.

## 2022-12-17 NOTE — Assessment & Plan Note (Signed)
Chronic. Previous control stable. She is managing at this time with diet. We will monitor labs today to ensure she remains in good control.

## 2022-12-17 NOTE — Assessment & Plan Note (Signed)
Chronic and well controlled with amlodipine, chlorthalidone, and losartan. No alarm symptoms. Labs pending Plan: - Continue your current medications

## 2022-12-17 NOTE — Patient Instructions (Addendum)
We will try to increase your Gabapentin 800mg  up to three times a day. You can substitute the morning dose for a 600mg  if your pain is not really bad.    If you get in with an eye doctor, please have them send the results to me.

## 2022-12-17 NOTE — Assessment & Plan Note (Signed)
Chronic with good control on citalopram.

## 2022-12-17 NOTE — Assessment & Plan Note (Signed)
Chronic. Currently managed with lovastatin. No concerns.  Labs pending - Continue current medications.

## 2022-12-17 NOTE — Assessment & Plan Note (Signed)
History of knee replacement on right, left knee now symptomatic with deteriorating kneecap. Patient unable to afford time off work for surgery at this time.  -Continue with diclofenac for inflammation. -Consider a supportive knee brace to help with stabilization.

## 2022-12-17 NOTE — Assessment & Plan Note (Signed)
Chronic low back and neck pain followed by pain management. She reports some improvement of pain with diclofenac and gabapentin, but the Tramadol written by pain management is ineffective. Given the increased cost of her co-pay, she has elected to stop going to pain management at this time. She is aware I am unable to provide narcotic pain management for her, but we discussed the option of increasing her gabapentin slightly to see if this is helpful. She is in agreement to this.  -Increase gabapentin to 800mg . OK to take 600-800mg  up to three times a day for pain.

## 2022-12-18 LAB — COMPREHENSIVE METABOLIC PANEL
ALT: 12 IU/L (ref 0–32)
AST: 15 IU/L (ref 0–40)
Albumin: 4.4 g/dL (ref 3.9–4.9)
Alkaline Phosphatase: 105 [IU]/L (ref 44–121)
BUN/Creatinine Ratio: 29 — ABNORMAL HIGH (ref 12–28)
BUN: 22 mg/dL (ref 8–27)
Bilirubin Total: 0.2 mg/dL (ref 0.0–1.2)
CO2: 23 mmol/L (ref 20–29)
Calcium: 9.3 mg/dL (ref 8.7–10.3)
Chloride: 102 mmol/L (ref 96–106)
Creatinine, Ser: 0.76 mg/dL (ref 0.57–1.00)
Globulin, Total: 2.7 g/dL (ref 1.5–4.5)
Glucose: 96 mg/dL (ref 70–99)
Potassium: 4.4 mmol/L (ref 3.5–5.2)
Sodium: 140 mmol/L (ref 134–144)
Total Protein: 7.1 g/dL (ref 6.0–8.5)
eGFR: 84 mL/min/{1.73_m2} (ref >59)

## 2022-12-18 LAB — MICROALBUMIN / CREATININE URINE RATIO
Creatinine, Urine: 30.5 mg/dL
Microalb/Creat Ratio: 17 mg/g{creat} (ref 0–29)
Microalbumin, Urine: 5.3 ug/mL

## 2022-12-18 LAB — CBC WITH DIFFERENTIAL/PLATELET
Basophils Absolute: 0.1 10*3/uL (ref 0.0–0.2)
Basos: 1 %
EOS (ABSOLUTE): 0.1 10*3/uL (ref 0.0–0.4)
Eos: 2 %
Hematocrit: 38 % (ref 34.0–46.6)
Hemoglobin: 12.1 g/dL (ref 11.1–15.9)
Immature Grans (Abs): 0 10*3/uL (ref 0.0–0.1)
Immature Granulocytes: 0 %
Lymphocytes Absolute: 2.5 10*3/uL (ref 0.7–3.1)
Lymphs: 34 %
MCH: 28.1 pg (ref 26.6–33.0)
MCHC: 31.8 g/dL (ref 31.5–35.7)
MCV: 88 fL (ref 79–97)
Monocytes Absolute: 0.5 10*3/uL (ref 0.1–0.9)
Monocytes: 7 %
Neutrophils Absolute: 4.1 10*3/uL (ref 1.4–7.0)
Neutrophils: 56 %
Platelets: 277 10*3/uL (ref 150–450)
RBC: 4.3 x10E6/uL (ref 3.77–5.28)
RDW: 13.9 % (ref 11.7–15.4)
WBC: 7.3 10*3/uL (ref 3.4–10.8)

## 2022-12-18 LAB — HEMOGLOBIN A1C
Est. average glucose Bld gHb Est-mCnc: 143 mg/dL
Hgb A1c MFr Bld: 6.6 % — ABNORMAL HIGH (ref 4.8–5.6)

## 2023-05-05 DIAGNOSIS — L821 Other seborrheic keratosis: Secondary | ICD-10-CM | POA: Diagnosis not present

## 2023-05-05 DIAGNOSIS — L570 Actinic keratosis: Secondary | ICD-10-CM | POA: Diagnosis not present

## 2023-05-05 DIAGNOSIS — D485 Neoplasm of uncertain behavior of skin: Secondary | ICD-10-CM | POA: Diagnosis not present

## 2023-05-05 DIAGNOSIS — M25562 Pain in left knee: Secondary | ICD-10-CM | POA: Diagnosis not present

## 2023-05-08 ENCOUNTER — Other Ambulatory Visit: Payer: Self-pay | Admitting: Nurse Practitioner

## 2023-05-08 DIAGNOSIS — F418 Other specified anxiety disorders: Secondary | ICD-10-CM

## 2023-05-08 DIAGNOSIS — G894 Chronic pain syndrome: Secondary | ICD-10-CM

## 2023-05-09 NOTE — Telephone Encounter (Signed)
 Last apt 12/17/22.

## 2023-05-19 ENCOUNTER — Ambulatory Visit: Payer: Self-pay | Admitting: Emergency Medicine

## 2023-05-19 DIAGNOSIS — G8929 Other chronic pain: Secondary | ICD-10-CM

## 2023-05-19 DIAGNOSIS — M25562 Pain in left knee: Secondary | ICD-10-CM | POA: Diagnosis not present

## 2023-05-19 NOTE — H&P (View-Only) (Signed)
 TOTAL KNEE ADMISSION H&P  Patient is being admitted for left total knee arthroplasty.  Subjective:  Chief Complaint:left knee pain.  HPI: Madison Collins, 73 y.o. female, has a history of pain and functional disability in the left knee due to arthritis and has failed non-surgical conservative treatments for greater than 12 weeks to includeNSAID's and/or analgesics and activity modification.  Onset of symptoms was gradual, starting 3 years ago with gradually worsening course since that time. The patient noted no past surgery on the left knee(s).  Patient currently rates pain in the left knee(s) at 10 out of 10 with activity. Patient has night pain, worsening of pain with activity and weight bearing, pain that interferes with activities of daily living, and pain with passive range of motion.  Patient has evidence of periarticular osteophytes and joint space narrowing by imaging studies.  There is no active infection.  Patient Active Problem List   Diagnosis Date Noted   Diabetes (HCC) 02/11/2020   Osteopenia 01/05/2019   Hyperlipidemia associated with type 2 diabetes mellitus (HCC) 10/05/2018   Bilateral carpal tunnel syndrome 04/13/2017   Primary osteoarthritis of first carpometacarpal joint of left hand 04/13/2017   Trigger ring finger of right hand 04/13/2017   Class 1 obesity due to excess calories without serious comorbidity with body mass index (BMI) of 30.0 to 30.9 in adult 08/05/2016   Chronic pain syndrome 08/05/2016   Hepatitis C virus infection without hepatic coma 05/19/2011   Hypertension associated with diabetes (HCC) 05/19/2011   Migraine 05/19/2011   Insomnia 05/19/2011   Chronic knee pain 05/19/2011   Depression with anxiety 05/19/2011   Gastroesophageal reflux 05/19/2011   Cervical spondylosis without myelopathy 05/19/2011   History of migraine headaches 05/19/2011   Past Medical History:  Diagnosis Date   Anxiety    Anxiety and depression    Arthritis     Bulging disc 1990's   c4-5   Cataract    right eye   Class 1 obesity due to excess calories without serious comorbidity with body mass index (BMI) of 30.0 to 30.9 in adult 08/05/2016   Depression    Encounter to establish care 07/29/2020   GERD (gastroesophageal reflux disease)    H/O: osteoarthritis    Headache    migraines occasionally   Hepatitis C    IA viral load low 02/09    BX past mild fibrosis   Hypertension    Insomnia, unspecified    Knee pain    Post-menopause 2010    Past Surgical History:  Procedure Laterality Date   BREAST BIOPSY Left    BUNIONECTOMY Right 1983   COLONOSCOPY  10/22/13   EYE SURGERY Right 2010   hole in macula   FRACTURE SURGERY N/A    Phreesia 04/14/2020   JOINT REPLACEMENT N/A    Phreesia 04/14/2020   KNEE SURGERY Right     x4   ORIF ELBOW FRACTURE Right 04/08/2015   Procedure: OPEN REDUCTION INTERNAL FIXATION (ORIF) ELBOW/OLECRANON FRACTURE;  Surgeon: Mack Hook, MD;  Location: [facility] OR;  Service: Orthopedics;  Laterality: Right;   ORIF RADIAL FRACTURE Right 04/08/2015   Procedure: OPEN REDUCTION INTERNAL FIXATION (ORIF) RADIAL HEAD ;  Surgeon: Mack Hook, MD;  Location: [facility] OR;  Service: Orthopedics;  Laterality: Right;   OVARIAN CYST REMOVAL     TOTAL KNEE ARTHROPLASTY Right 12/24/2013   Procedure: RIGHT TOTAL KNEE ARTHROPLASTY;  Surgeon: Nestor Lewandowsky, MD;  Location: [facility] OR;  Service: Orthopedics;  Laterality: Right;   TUBAL  LIGATION  1987    Current Outpatient Medications  Medication Sig Dispense Refill Last Dose/Taking   amLODipine (NORVASC) 10 MG tablet TAKE 1 TABLET BY MOUTH DAILY 90 tablet 3    chlorthalidone (HYGROTON) 25 MG tablet TAKE 1 TABLET BY MOUTH DAILY 90 tablet 3    citalopram (CELEXA) 40 MG tablet TAKE 1 TABLET BY MOUTH EVERY MORNING 90 tablet 1    cyclobenzaprine (FLEXERIL) 10 MG tablet TAKE 1 TABLET BY MOUTH TWICE A DAY AS NEEDED FOR MUSCLE SPASM 60 tablet 1    diclofenac (VOLTAREN) 75 MG EC tablet TAKE 1 TABLET BY MOUTH  TWICE A DAY 180 tablet 1    diclofenac Sodium (VOLTAREN) 1 % GEL Apply 4 grams topically 4 (four) times daily. 100 g 5    gabapentin (NEURONTIN) 800 MG tablet Take 1 tablet (800 mg total) by mouth 3 (three) times daily. 270 tablet 1    losartan (COZAAR) 100 MG tablet TAKE 1 TABLET BY MOUTH DAILY 90 tablet 3    lovastatin (MEVACOR) 40 MG tablet Take 1 tablet (40 mg total) by mouth at bedtime. 90 tablet 3    omeprazole (PRILOSEC) 20 MG capsule TAKE 1 CAPSULE BY MOUTH EVERY MORNING 90 capsule 3    SUMAtriptan (IMITREX) 100 MG tablet Take 1 tablet (100 mg total) by mouth at onset of headache, may repeat one tablet in 2 hours if needed, *Max of 2 tablets in 24 hours* 8 tablet 5    traMADol (ULTRAM) 50 MG tablet Take 50 mg by mouth 2 (two) times daily as needed.      Vitamin D, Ergocalciferol, (DRISDOL) 1.25 MG (50000 UNIT) CAPS capsule TAKE ONE CAPSULE BY MOUTH EVERY 7 DAYS 12 capsule 3    No current facility-administered medications for this visit.   Allergies  Allergen Reactions   Adhesive [Tape] Rash    "per pt - surgical tape, also causes itching   Latex Other (See Comments) and Rash    "Latex tape used during surgery - breaks my skin out"    Social History   Tobacco Use   Smoking status: Never   Smokeless tobacco: Never  Substance Use Topics   Alcohol use: Yes    Comment: a glass of wine/rarely    Family History  Problem Relation Age of Onset   Cancer Mother        Lung with brain mets   Diabetes Mother    Stroke Father 33   Diabetes Father    Colon cancer Father 59       colorectal/anal cancer   Hypertension Brother    Bipolar disorder Daughter    Bipolar disorder Son    Diabetes Maternal Grandfather    Heart disease Maternal Grandfather    Emphysema Paternal Grandmother    Cancer Paternal Grandfather        stomach   Breast cancer Maternal Aunt      Review of Systems  Musculoskeletal:  Positive for arthralgias.  All other systems reviewed and are  negative.   Objective:  Physical Exam Constitutional:      General: She is not in acute distress.    Appearance: Normal appearance. She is not ill-appearing.  HENT:     Head: Normocephalic and atraumatic.     Right Ear: External ear normal.     Left Ear: External ear normal.     Nose: Nose normal.     Mouth/Throat:     Mouth: Mucous membranes are moist.     Pharynx:  Oropharynx is clear.  Eyes:     Extraocular Movements: Extraocular movements intact.     Conjunctiva/sclera: Conjunctivae normal.  Cardiovascular:     Rate and Rhythm: Normal rate and regular rhythm.     Pulses: Normal pulses.     Heart sounds: Normal heart sounds.  Pulmonary:     Effort: Pulmonary effort is normal.     Breath sounds: Normal breath sounds.  Abdominal:     General: Bowel sounds are normal.     Palpations: Abdomen is soft.     Tenderness: There is no abdominal tenderness.  Musculoskeletal:        General: Tenderness present.     Cervical back: Normal range of motion and neck supple.     Comments: TTP over medial and lateral joint line, medial worse than lateral.  No calf tenderness, swelling, or erythema.  No overlying lesions of area of chief complaint.  Decreased strength and ROM due to elicited pain.  Pre-operative ROM 0-120.  Dorsiflexion and plantarflexion intact.  Stable to varus and valgus stress.  BLE appear grossly neurovascularly intact.  Gait mildly antalgic.   Skin:    General: Skin is warm and dry.  Neurological:     Mental Status: She is alert and oriented to person, place, and time. Mental status is at baseline.  Psychiatric:        Mood and Affect: Mood normal.        Behavior: Behavior normal.     Vital signs in last 24 hours: @VSRANGES @  Labs:   Estimated body mass index is 32.38 kg/m as calculated from the following:   Height as of 07/08/22: 5' 1.25" (1.556 m).   Weight as of 12/17/22: 78.4 kg.   Imaging Review Plain radiographs demonstrate  moderate-severe   degenerative joint disease of the left knee(s). The overall alignment ismild varus. The bone quality appears to be fair for age and reported activity level.      Assessment/Plan:  End stage arthritis, left knee   The patient history, physical examination, clinical judgment of the provider and imaging studies are consistent with end stage degenerative joint disease of the left knee(s) and total knee arthroplasty is deemed medically necessary. The treatment options including medical management, injection therapy arthroscopy and arthroplasty were discussed at length. The risks and benefits of total knee arthroplasty were presented and reviewed. The risks due to aseptic loosening, infection, stiffness, patella tracking problems, thromboembolic complications and other imponderables were discussed. The patient acknowledged the explanation, agreed to proceed with the plan and consent was signed. Patient is being admitted for inpatient treatment for surgery, pain control, PT, OT, prophylactic antibiotics, VTE prophylaxis, progressive ambulation and ADL's and discharge planning. The patient is planning to be discharged home with outpatient PT.    Anticipated LOS equal to or greater than 2 midnights due to - Age 44 and older with one or more of the following:  - Obesity  - Expected need for hospital services (PT, OT, Nursing) required for safe  discharge  - Anticipated need for postoperative skilled nursing care or inpatient rehab  - Active co-morbidities: diabetes, HTN, HLD, depression, anxiety, chronic hepatitis C treated with pharmacotherapy, insomnia, chronic pain syndrome, migraines, GERD , hx of MRSA infection OR   - Unanticipated findings during/Post Surgery: None  - Patient is a high risk of re-admission due to: None

## 2023-05-19 NOTE — Progress Notes (Signed)
 Surgery orders requested via Epic inbox.

## 2023-05-19 NOTE — H&P (Signed)
 TOTAL KNEE ADMISSION H&P  Patient is being admitted for left total knee arthroplasty.  Subjective:  Chief Complaint:left knee pain.  HPI: Madison Collins, 73 y.o. female, has a history of pain and functional disability in the left knee due to arthritis and has failed non-surgical conservative treatments for greater than 12 weeks to includeNSAID's and/or analgesics and activity modification.  Onset of symptoms was gradual, starting 3 years ago with gradually worsening course since that time. The patient noted no past surgery on the left knee(s).  Patient currently rates pain in the left knee(s) at 10 out of 10 with activity. Patient has night pain, worsening of pain with activity and weight bearing, pain that interferes with activities of daily living, and pain with passive range of motion.  Patient has evidence of periarticular osteophytes and joint space narrowing by imaging studies.  There is no active infection.  Patient Active Problem List   Diagnosis Date Noted   Diabetes (HCC) 02/11/2020   Osteopenia 01/05/2019   Hyperlipidemia associated with type 2 diabetes mellitus (HCC) 10/05/2018   Bilateral carpal tunnel syndrome 04/13/2017   Primary osteoarthritis of first carpometacarpal joint of left hand 04/13/2017   Trigger ring finger of right hand 04/13/2017   Class 1 obesity due to excess calories without serious comorbidity with body mass index (BMI) of 30.0 to 30.9 in adult 08/05/2016   Chronic pain syndrome 08/05/2016   Hepatitis C virus infection without hepatic coma 05/19/2011   Hypertension associated with diabetes (HCC) 05/19/2011   Migraine 05/19/2011   Insomnia 05/19/2011   Chronic knee pain 05/19/2011   Depression with anxiety 05/19/2011   Gastroesophageal reflux 05/19/2011   Cervical spondylosis without myelopathy 05/19/2011   History of migraine headaches 05/19/2011   Past Medical History:  Diagnosis Date   Anxiety    Anxiety and depression    Arthritis     Bulging disc 1990's   c4-5   Cataract    right eye   Class 1 obesity due to excess calories without serious comorbidity with body mass index (BMI) of 30.0 to 30.9 in adult 08/05/2016   Depression    Encounter to establish care 07/29/2020   GERD (gastroesophageal reflux disease)    H/O: osteoarthritis    Headache    migraines occasionally   Hepatitis C    IA viral load low 02/09    BX past mild fibrosis   Hypertension    Insomnia, unspecified    Knee pain    Post-menopause 2010    Past Surgical History:  Procedure Laterality Date   BREAST BIOPSY Left    BUNIONECTOMY Right 1983   COLONOSCOPY  10/22/13   EYE SURGERY Right 2010   hole in macula   FRACTURE SURGERY N/A    [employer]rees[employer] 04/14/2020   JOINT REPLACEMENT N/A    [employer] 04/14/2020   KNEE SURGERY Right     x4   ORIF ELBOW FRACTURE Right 04/08/2015   Procedure: OPEN REDUCTION INTERNAL FIXATION (ORIF) ELBOW/OLECRANON FRACTURE;  Surgeon: Mack Hook, MD;  Location: [facility] OR;  Service: Orthopedics;  Laterality: Right;   ORIF RADIAL FRACTURE Right 04/08/2015   Procedure: OPEN REDUCTION INTERNAL FIXATION (ORIF) RADIAL HEAD ;  Surgeon: Mack Hook, MD;  Location: [facility] OR;  Service: Orthopedics;  Laterality: Right;   OVARIAN CYST REMOVAL     TOTAL KNEE ARTHROPLASTY Right 12/24/2013   Procedure: RIGHT TOTAL KNEE ARTHROPLASTY;  Surgeon: Nestor Lewandowsky, MD;  Location: [facility] OR;  Service: Orthopedics;  Laterality: Right;   TUBAL  LIGATION  1987    Current Outpatient Medications  Medication Sig Dispense Refill Last Dose/Taking   amLODipine (NORVASC) 10 MG tablet TAKE 1 TABLET BY MOUTH DAILY 90 tablet 3    chlorthalidone (HYGROTON) 25 MG tablet TAKE 1 TABLET BY MOUTH DAILY 90 tablet 3    citalopram (CELEXA) 40 MG tablet TAKE 1 TABLET BY MOUTH EVERY MORNING 90 tablet 1    cyclobenzaprine (FLEXERIL) 10 MG tablet TAKE 1 TABLET BY MOUTH TWICE A DAY AS NEEDED FOR MUSCLE SPASM 60 tablet 1    diclofenac (VOLTAREN) 75 MG EC tablet TAKE 1 TABLET BY MOUTH  TWICE A DAY 180 tablet 1    diclofenac Sodium (VOLTAREN) 1 % GEL Apply 4 grams topically 4 (four) times daily. 100 g 5    gabapentin (NEURONTIN) 800 MG tablet Take 1 tablet (800 mg total) by mouth 3 (three) times daily. 270 tablet 1    losartan (COZAAR) 100 MG tablet TAKE 1 TABLET BY MOUTH DAILY 90 tablet 3    lovastatin (MEVACOR) 40 MG tablet Take 1 tablet (40 mg total) by mouth at bedtime. 90 tablet 3    omeprazole (PRILOSEC) 20 MG capsule TAKE 1 CAPSULE BY MOUTH EVERY MORNING 90 capsule 3    SUMAtriptan (IMITREX) 100 MG tablet Take 1 tablet (100 mg total) by mouth at onset of headache, may repeat one tablet in 2 hours if needed, *Max of 2 tablets in 24 hours* 8 tablet 5    traMADol (ULTRAM) 50 MG tablet Take 50 mg by mouth 2 (two) times daily as needed.      Vitamin D, Ergocalciferol, (DRISDOL) 1.25 MG (50000 UNIT) CAPS capsule TAKE ONE CAPSULE BY MOUTH EVERY 7 DAYS 12 capsule 3    No current facility-administered medications for this visit.   Allergies  Allergen Reactions   Adhesive [Tape] Rash    "per pt - surgical tape, also causes itching   Latex Other (See Comments) and Rash    "Latex tape used during surgery - breaks my skin out"    Social History   Tobacco Use   Smoking status: Never   Smokeless tobacco: Never  Substance Use Topics   Alcohol use: Yes    Comment: a glass of wine/rarely    Family History  Problem Relation Age of Onset   Cancer Mother        Lung with brain mets   Diabetes Mother    Stroke Father 33   Diabetes Father    Colon cancer Father 59       colorectal/anal cancer   Hypertension Brother    Bipolar disorder Daughter    Bipolar disorder Son    Diabetes Maternal Grandfather    Heart disease Maternal Grandfather    Emphysema Paternal Grandmother    Cancer Paternal Grandfather        stomach   Breast cancer Maternal Aunt      Review of Systems  Musculoskeletal:  Positive for arthralgias.  All other systems reviewed and are  negative.   Objective:  Physical Exam Constitutional:      General: She is not in acute distress.    Appearance: Normal appearance. She is not ill-appearing.  HENT:     Head: Normocephalic and atraumatic.     Right Ear: External ear normal.     Left Ear: External ear normal.     Nose: Nose normal.     Mouth/Throat:     Mouth: Mucous membranes are moist.     Pharynx:  Oropharynx is clear.  Eyes:     Extraocular Movements: Extraocular movements intact.     Conjunctiva/sclera: Conjunctivae normal.  Cardiovascular:     Rate and Rhythm: Normal rate and regular rhythm.     Pulses: Normal pulses.     Heart sounds: Normal heart sounds.  Pulmonary:     Effort: Pulmonary effort is normal.     Breath sounds: Normal breath sounds.  Abdominal:     General: Bowel sounds are normal.     Palpations: Abdomen is soft.     Tenderness: There is no abdominal tenderness.  Musculoskeletal:        General: Tenderness present.     Cervical back: Normal range of motion and neck supple.     Comments: TTP over medial and lateral joint line, medial worse than lateral.  No calf tenderness, swelling, or erythema.  No overlying lesions of area of chief complaint.  Decreased strength and ROM due to elicited pain.  Pre-operative ROM 0-120.  Dorsiflexion and plantarflexion intact.  Stable to varus and valgus stress.  BLE appear grossly neurovascularly intact.  Gait mildly antalgic.   Skin:    General: Skin is warm and dry.  Neurological:     Mental Status: She is alert and oriented to person, place, and time. Mental status is at baseline.  Psychiatric:        Mood and Affect: Mood normal.        Behavior: Behavior normal.     Vital signs in last 24 hours: @VSRANGES @  Labs:   Estimated body mass index is 32.38 kg/m as calculated from the following:   Height as of 07/08/22: 5' 1.25" (1.556 m).   Weight as of 12/17/22: 78.4 kg.   Imaging Review Plain radiographs demonstrate  moderate-severe   degenerative joint disease of the left knee(s). The overall alignment ismild varus. The bone quality appears to be fair for age and reported activity level.      Assessment/Plan:  End stage arthritis, left knee   The patient history, physical examination, clinical judgment of the provider and imaging studies are consistent with end stage degenerative joint disease of the left knee(s) and total knee arthroplasty is deemed medically necessary. The treatment options including medical management, injection therapy arthroscopy and arthroplasty were discussed at length. The risks and benefits of total knee arthroplasty were presented and reviewed. The risks due to aseptic loosening, infection, stiffness, patella tracking problems, thromboembolic complications and other imponderables were discussed. The patient acknowledged the explanation, agreed to proceed with the plan and consent was signed. Patient is being admitted for inpatient treatment for surgery, pain control, PT, OT, prophylactic antibiotics, VTE prophylaxis, progressive ambulation and ADL's and discharge planning. The patient is planning to be discharged home with outpatient PT.    Anticipated LOS equal to or greater than 2 midnights due to - Age 44 and older with one or more of the following:  - Obesity  - Expected need for hospital services (PT, OT, Nursing) required for safe  discharge  - Anticipated need for postoperative skilled nursing care or inpatient rehab  - Active co-morbidities: diabetes, HTN, HLD, depression, anxiety, chronic hepatitis C treated with pharmacotherapy, insomnia, chronic pain syndrome, migraines, GERD , hx of MRSA infection OR   - Unanticipated findings during/Post Surgery: None  - Patient is a high risk of re-admission due to: None

## 2023-05-26 ENCOUNTER — Encounter (HOSPITAL_COMMUNITY)
Admission: RE | Admit: 2023-05-26 | Discharge: 2023-05-26 | Disposition: A | Discharge status: Home / Self Care | Source: Ambulatory Visit | Attending: Nurse Practitioner | Admitting: Nurse Practitioner

## 2023-05-27 ENCOUNTER — Encounter (HOSPITAL_COMMUNITY): Payer: Self-pay

## 2023-05-27 ENCOUNTER — Other Ambulatory Visit: Payer: Self-pay

## 2023-05-27 ENCOUNTER — Telehealth: Payer: Self-pay | Admitting: Nurse Practitioner

## 2023-05-27 DIAGNOSIS — I152 Hypertension secondary to endocrine disorders: Secondary | ICD-10-CM

## 2023-05-27 MED ORDER — LOVASTATIN 40 MG PO TABS: 40.0000 mg | ORAL_TABLET | Freq: Every day | ORAL | 1 refills | Status: DC

## 2023-05-27 NOTE — Telephone Encounter (Signed)
 Karin Golden Lovastatin 40  # 90

## 2023-05-27 NOTE — Progress Notes (Addendum)
 PCP - Georgeanne Nim  ,NP Cardiologist - no  PPM/ICD -  Device Orders -  Rep Notified -   Chest x-ray -  EKG - 05-30-23 Stress Test -  ECHO -  Cardiac Cath -   Sleep Study -  CPAP -   Fasting Blood Sugar -  Checks Blood Sugar _____ times a day  Blood Thinner Instructions:N/A Aspirin Instructions:  ERAS Protcol - PRE-SURGERY  G2-    COVID vaccine -yes  Activity--Able to climb a flight of stairs without CP or SOB  Anesthesia review: HTN, HEP C treated , DM no meds  Patient denies shortness of breath, fever, cough and chest pain at PAT appointment   All instructions explained to the patient, with a verbal understanding of the material. Patient agrees to go over the instructions while at home for a better understanding. Patient also instructed to self quarantine after being tested for COVID-19. The opportunity to ask questions was provided.

## 2023-05-27 NOTE — Patient Instructions (Addendum)
 SURGICAL WAITING ROOM VISITATION  Patients having surgery or a procedure may have no more than 2 support people in the waiting area - these visitors may rotate.    Children under the age of 41 must have an adult with them who is not the patient.  Due to an increase in RSV and influenza rates and associated hospitalizations, children ages 66 and under may not visit patients in [facility] hospitals.  Visitors with respiratory illnesses are discouraged from visiting and should remain at home.  If the patient needs to stay at the hospital during part of their recovery, the visitor guidelines for inpatient rooms apply. Pre-op nurse will coordinate an appropriate time for 1 support person to accompany patient in pre-op.  This support person may not rotate.    Please refer to the [facility] website for the visitor guidelines for Inpatients (after your surgery is over and you are in a regular room).       Your procedure is scheduled on: 06-06-23   Report to [facility] Main Entrance    Report to admitting at   0515   AM   Call this number if you have problems the morning of surgery [phone]   Do not eat food :After Midnight.   After Midnight you may have the following liquids until _0415_____ AM/  DAY OF SURGERY then nothing by mouth  Water Non-Citrus Juices (without pulp, NO RED-Apple, White grape, White cranberry) Black Coffee (NO MILK/CREAM OR CREAMERS, sugar ok)  Clear Tea (NO MILK/CREAM OR CREAMERS, sugar ok) regular and decaf                             Plain Jell-O (NO RED)                                           Fruit ices (not with fruit pulp, NO RED)                                     Popsicles (NO RED)                                                               Sports drinks like Gatorade (NO RED)                    The day of surgery:  Drink ONE (1) Pre-Surgery G2 BY 0415   AM the morning of surgery. Drink in one sitting. Do not sip.  This drink was  given to you during your hospital  pre-op appointment visit. Nothing else to drink after completing the  Pre-Surgery Clear  G2.          If you have questions, please contact your surgeon's office.   FOLLOW ANY ADDITIONAL PRE OP INSTRUCTIONS YOU RECEIVED FROM YOUR SURGEON'S OFFICE!!!     Oral Hygiene is also important to reduce your risk of infection.  Remember - BRUSH YOUR TEETH THE MORNING OF SURGERY WITH YOUR REGULAR TOOTHPASTE  DENTURES WILL BE REMOVED PRIOR TO SURGERY PLEASE DO NOT APPLY "Poly grip" OR ADHESIVES!!!   Do NOT smoke after Midnight   Stop all vitamins and herbal supplements 7 days before surgery.   Take these medicines the morning of surgery with A SIP OF WATER: omeprazole, loratadine, gabapentin, celexa, amlodipine, tylenol if needed  DO NOT TAKE ANY ORAL DIABETIC MEDICATIONS DAY OF YOUR SURGERY  Bring CPAP mask and tubing day of surgery.                              You may not have any metal on your body including hair pins, jewelry, and body piercing             Do not wear make-up, lotions, powders, perfumes/cologne, or deodorant  Do not wear nail polish including gel and S&S, artificial/acrylic nails, or any other type of covering on natural nails including finger and toenails. If you have artificial nails, gel coating, etc. that needs to be removed by a nail salon please have this removed prior to surgery or surgery may need to be canceled/ delayed if the surgeon/ anesthesia feels like they are unable to be safely monitored.   Do not shave  5 days prior to surgery.           Do not bring valuables to the hospital. [facility] IS NOT             RESPONSIBLE   FOR VALUABLES.   Contacts, glasses, dentures or bridgework may not be worn into surgery.   Bring small overnight bag day of surgery.   DO NOT BRING YOUR HOME MEDICATIONS TO THE HOSPITAL. PHARMACY WILL DISPENSE MEDICATIONS LISTED ON YOUR MEDICATION LIST TO YOU  DURING YOUR ADMISSION IN THE HOSPITAL!    Patients discharged on the day of surgery will not be allowed to drive home.  Someone NEEDS to stay with you for the first 24 hours after anesthesia.   Special Instructions: Bring a copy of your healthcare power of attorney and living will documents the day of surgery if you haven't scanned them before.              Please read over the following fact sheets you were given: IF YOU HAVE QUESTIONS ABOUT YOUR PRE-OP INSTRUCTIONS PLEASE CALL [phone]    If you test positive for Covid or have been in contact with anyone that has tested positive in the last 10 days please notify you surgeon.      Pre-operative 5 CHG Bath Instructions   You can play a key role in reducing the risk of infection after surgery. Your skin needs to be as free of germs as possible. You can reduce the number of germs on your skin by washing with CHG (chlorhexidine gluconate) soap before surgery. CHG is an antiseptic soap that kills germs and continues to kill germs even after washing.   DO NOT use if you have an allergy to chlorhexidine/CHG or antibacterial soaps. If your skin becomes reddened or irritated, stop using the CHG and notify one of our RNs at [phone].   Please shower with the CHG soap starting 4 days before surgery using the following schedule:     Please keep in mind the following:  DO NOT shave, including legs and underarms, starting the day of your first shower.   You may  shave your face at any point before/day of surgery.  Place clean sheets on your bed the day you start using CHG soap. Use a clean washcloth (not used since being washed) for each shower. DO NOT sleep with pets once you start using the CHG.   CHG Shower Instructions:  If you choose to wash your hair and private area, wash first with your normal shampoo/soap.  After you use shampoo/soap, rinse your hair and body thoroughly to remove shampoo/soap residue.  Turn the water OFF and apply  about 3 tablespoons (45 ml) of CHG soap to a CLEAN washcloth.  Apply CHG soap ONLY FROM YOUR NECK DOWN TO YOUR TOES (washing for 3-5 minutes)  DO NOT use CHG soap on face, private areas, open wounds, or sores.  Pay special attention to the area where your surgery is being performed.  If you are having back surgery, having someone wash your back for you may be helpful. Wait 2 minutes after CHG soap is applied, then you may rinse off the CHG soap.  Pat dry with a clean towel  Put on clean clothes/pajamas   If you choose to wear lotion, please use ONLY the CHG-compatible lotions on the back of this paper.     Additional instructions for the day of surgery: DO NOT APPLY any lotions, deodorants, cologne, or perfumes.   Put on clean/comfortable clothes.  Brush your teeth.  Ask your nurse before applying any prescription medications to the skin.      CHG Compatible Lotions   Aveeno Moisturizing lotion  Cetaphil Moisturizing Cream  Cetaphil Moisturizing Lotion  Clairol Herbal Essence Moisturizing Lotion, Dry Skin  Clairol Herbal Essence Moisturizing Lotion, Extra Dry Skin  Clairol Herbal Essence Moisturizing Lotion, Normal Skin  Curel Age Defying Therapeutic Moisturizing Lotion with Alpha Hydroxy  Curel Extreme Care Body Lotion  Curel Soothing Hands Moisturizing Hand Lotion  Curel Therapeutic Moisturizing Cream, Fragrance-Free  Curel Therapeutic Moisturizing Lotion, Fragrance-Free  Curel Therapeutic Moisturizing Lotion, Original Formula  Eucerin Daily Replenishing Lotion  Eucerin Dry Skin Therapy Plus Alpha Hydroxy Crme  Eucerin Dry Skin Therapy Plus Alpha Hydroxy Lotion  Eucerin Original Crme  Eucerin Original Lotion  Eucerin Plus Crme Eucerin Plus Lotion  Eucerin TriLipid Replenishing Lotion  Keri Anti-Bacterial Hand Lotion  Keri Deep Conditioning Original Lotion Dry Skin Formula Softly Scented  Keri Deep Conditioning Original Lotion, Fragrance Free Sensitive Skin Formula   Keri Lotion Fast Absorbing Fragrance Free Sensitive Skin Formula  Keri Lotion Fast Absorbing Softly Scented Dry Skin Formula  Keri Original Lotion  Keri Skin Renewal Lotion Keri Silky Smooth Lotion  Keri Silky Smooth Sensitive Skin Lotion  Nivea Body Creamy Conditioning Oil  Nivea Body Extra Enriched Teacher, adult education Moisturizing Lotion Nivea Crme  Nivea Skin Firming Lotion  NutraDerm 30 Skin Lotion  NutraDerm Skin Lotion  NutraDerm Therapeutic Skin Cream  NutraDerm Therapeutic Skin Lotion  ProShield Protective Hand Cream  WHAT IS A BLOOD TRANSFUSION? Blood Transfusion Information  A transfusion is the replacement of blood or some of its parts. Blood is made up of multiple cells which provide different functions. Red blood cells carry oxygen and are used for blood loss replacement. White blood cells fight against infection. Platelets control bleeding. Plasma helps clot blood. Other blood products are available for specialized needs, such as hemophilia or other clotting disorders. BEFORE THE TRANSFUSION  Who gives blood for transfusions?  Healthy volunteers who are fully evaluated to make sure their blood  is safe. This is blood bank blood. Transfusion therapy is the safest it has ever been in the practice of medicine. Before blood is taken from a donor, a complete history is taken to make sure that person has no history of diseases nor engages in risky social behavior (examples are intravenous drug use or sexual activity with multiple partners). The donor's travel history is screened to minimize risk of transmitting infections, such as malaria. The donated blood is tested for signs of infectious diseases, such as HIV and hepatitis. The blood is then tested to be sure it is compatible with you in order to minimize the chance of a transfusion reaction. If you or a relative donates blood, this is often done in anticipation of surgery and is not appropriate  for emergency situations. It takes many days to process the donated blood. RISKS AND COMPLICATIONS Although transfusion therapy is very safe and saves many lives, the main dangers of transfusion include:  Getting an infectious disease. Developing a transfusion reaction. This is an allergic reaction to something in the blood you were given. Every precaution is taken to prevent this. The decision to have a blood transfusion has been considered carefully by your caregiver before blood is given. Blood is not given unless the benefits outweigh the risks. AFTER THE TRANSFUSION Right after receiving a blood transfusion, you will usually feel much better and more energetic. This is especially true if your red blood cells have gotten low (anemic). The transfusion raises the level of the red blood cells which carry oxygen, and this usually causes an energy increase. The nurse administering the transfusion will monitor you carefully for complications. HOME CARE INSTRUCTIONS  No special instructions are needed after a transfusion. You may find your energy is better. Speak with your caregiver about any limitations on activity for underlying diseases you may have. SEEK MEDICAL CARE IF:  Your condition is not improving after your transfusion. You develop redness or irritation at the intravenous (IV) site. SEEK IMMEDIATE MEDICAL CARE IF:  Any of the following symptoms occur over the next 12 hours: Shaking chills. You have a temperature by mouth above 102 F (38.9 C), not controlled by medicine. Chest, back, or muscle pain. People around you feel you are not acting correctly or are confused. Shortness of breath or difficulty breathing. Dizziness and fainting. You get a rash or develop hives. You have a decrease in urine output. Your urine turns a dark color or changes to pink, red, or brown. Any of the following symptoms occur over the next 10 days: You have a temperature by mouth above 102 F (38.9 C),  not controlled by medicine. Shortness of breath. Weakness after normal activity. The white part of the eye turns yellow (jaundice). You have a decrease in the amount of urine or are urinating less often. Your urine turns a dark color or changes to pink, red, or brown. Document Released: 02/06/2000 Document Revised: 05/03/2011 Document Reviewed: 09/25/2007 [employer] Patient Information 2014 [place], [place].  _______________________________________________________________________  Incentive Spirometer  An incentive spirometer is a tool that can help keep your lungs clear and active. This tool measures how well you are filling your lungs with each breath. Taking long deep breaths may help reverse or decrease the chance of developing breathing (pulmonary) problems (especially infection) following: A long period of time when you are unable to move or be active. BEFORE THE PROCEDURE  If the spirometer includes an indicator to show your best effort, your nurse or respiratory therapist will  set it to a desired goal. If possible, sit up straight or lean slightly forward. Try not to slouch. Hold the incentive spirometer in an upright position. INSTRUCTIONS FOR USE  Sit on the edge of your bed if possible, or sit up as far as you can in bed or on a chair. Hold the incentive spirometer in an upright position. Breathe out normally. Place the mouthpiece in your mouth and seal your lips tightly around it. Breathe in slowly and as deeply as possible, raising the piston or the ball toward the top of the column. Hold your breath for 3-5 seconds or for as long as possible. Allow the piston or ball to fall to the bottom of the column. Remove the mouthpiece from your mouth and breathe out normally. Rest for a few seconds and repeat Steps 1 through 7 at least 10 times every 1-2 hours when you are awake. Take your time and take a few normal breaths between deep breaths. The spirometer may include an indicator to  show your best effort. Use the indicator as a goal to work toward during each repetition. After each set of 10 deep breaths, practice coughing to be sure your lungs are clear. If you have an incision (the cut made at the time of surgery), support your incision when coughing by placing a pillow or rolled up towels firmly against it. Once you are able to get out of bed, walk around indoors and cough well. You may stop using the incentive spirometer when instructed by your caregiver.  RISKS AND COMPLICATIONS Take your time so you do not get dizzy or light-headed. If you are in pain, you may need to take or ask for pain medication before doing incentive spirometry. It is harder to take a deep breath if you are having pain. AFTER USE Rest and breathe slowly and easily. It can be helpful to keep track of a log of your progress. Your caregiver can provide you with a simple table to help with this. If you are using the spirometer at home, follow these instructions: SEEK MEDICAL CARE IF:  You are having difficultly using the spirometer. You have trouble using the spirometer as often as instructed. Your pain medication is not giving enough relief while using the spirometer. You develop fever of 100.5 F (38.1 C) or higher. SEEK IMMEDIATE MEDICAL CARE IF:  You cough up bloody sputum that had not been present before. You develop fever of 102 F (38.9 C) or greater. You develop worsening pain at or near the incision site. MAKE SURE YOU:  Understand these instructions. Will watch your condition. Will get help right away if you are not doing well or get worse. Document Released: 06/21/2006 Document Revised: 05/03/2011 Document Reviewed: 08/22/2006 [facility] Patient Information 2014 [place], [place].   ________________________________________________________________________

## 2023-05-30 ENCOUNTER — Other Ambulatory Visit: Payer: Self-pay

## 2023-05-30 ENCOUNTER — Encounter (HOSPITAL_COMMUNITY): Payer: Self-pay

## 2023-05-30 ENCOUNTER — Encounter (HOSPITAL_COMMUNITY)
Admission: RE | Admit: 2023-05-30 | Discharge: 2023-05-30 | Disposition: A | Discharge status: Home / Self Care | Source: Ambulatory Visit | Attending: Orthopedic Surgery | Admitting: Orthopedic Surgery

## 2023-05-30 VITALS — BP 131/61 | HR 66 | Temp 98.0°F | Resp 16 | Ht 60.0 in | Wt 171.0 lb

## 2023-05-30 DIAGNOSIS — M25562 Pain in left knee: Secondary | ICD-10-CM | POA: Insufficient documentation

## 2023-05-30 DIAGNOSIS — G8929 Other chronic pain: Secondary | ICD-10-CM | POA: Diagnosis not present

## 2023-05-30 DIAGNOSIS — E1165 Type 2 diabetes mellitus with hyperglycemia: Secondary | ICD-10-CM | POA: Insufficient documentation

## 2023-05-30 DIAGNOSIS — Z01818 Encounter for other preprocedural examination: Secondary | ICD-10-CM | POA: Insufficient documentation

## 2023-05-30 HISTORY — DX: Type 2 diabetes mellitus without complications: E11.9

## 2023-05-30 HISTORY — DX: Pneumonia, unspecified organism: J18.9

## 2023-05-30 HISTORY — DX: Unspecified staphylococcus as the cause of diseases classified elsewhere: B95.8

## 2023-05-30 LAB — CBC WITH DIFFERENTIAL/PLATELET
Abs Immature Granulocytes: 0.03 10*3/uL (ref 0.00–0.07)
Basophils Absolute: 0.1 10*3/uL (ref 0.0–0.1)
Basophils Relative: 1 %
Eosinophils Absolute: 0.2 10*3/uL (ref 0.0–0.5)
Eosinophils Relative: 3 %
HCT: 38.7 % (ref 36.0–46.0)
Hemoglobin: 12.4 g/dL (ref 12.0–15.0)
Immature Granulocytes: 0 %
Lymphocytes Relative: 33 %
Lymphs Abs: 2.5 10*3/uL (ref 0.7–4.0)
MCH: 28.2 pg (ref 26.0–34.0)
MCHC: 32 g/dL (ref 30.0–36.0)
MCV: 88.2 fL (ref 80.0–100.0)
Monocytes Absolute: 0.6 10*3/uL (ref 0.1–1.0)
Monocytes Relative: 8 %
Neutro Abs: 4.3 10*3/uL (ref 1.7–7.7)
Neutrophils Relative %: 55 %
Platelets: 273 10*3/uL (ref 150–400)
RBC: 4.39 MIL/uL (ref 3.87–5.11)
RDW: 13.3 % (ref 11.5–15.5)
WBC: 7.8 10*3/uL (ref 4.0–10.5)
nRBC: 0 % (ref 0.0–0.2)

## 2023-05-30 LAB — COMPREHENSIVE METABOLIC PANEL WITH GFR
ALT: 20 U/L (ref 0–44)
AST: 16 U/L (ref 15–41)
Albumin: 4.1 g/dL (ref 3.5–5.0)
Alkaline Phosphatase: 74 U/L (ref 38–126)
Anion gap: 10 (ref 5–15)
BUN: 29 mg/dL — ABNORMAL HIGH (ref 8–23)
CO2: 22 mmol/L (ref 22–32)
Calcium: 9.3 mg/dL (ref 8.9–10.3)
Chloride: 104 mmol/L (ref 98–111)
Creatinine, Ser: 0.69 mg/dL (ref 0.44–1.00)
GFR, Estimated: >60 mL/min (ref >60)
Glucose, Bld: 110 mg/dL — ABNORMAL HIGH (ref 70–99)
Potassium: 4 mmol/L (ref 3.5–5.1)
Sodium: 136 mmol/L (ref 135–145)
Total Bilirubin: 0.6 mg/dL (ref 0.0–1.2)
Total Protein: 7.5 g/dL (ref 6.5–8.1)

## 2023-05-30 LAB — SURGICAL PCR SCREEN
MRSA, PCR: NEGATIVE
Staphylococcus aureus: NEGATIVE

## 2023-05-30 LAB — GLUCOSE, CAPILLARY: Glucose-Capillary: 106 mg/dL — ABNORMAL HIGH (ref 70–99)

## 2023-05-31 DIAGNOSIS — M1712 Unilateral primary osteoarthritis, left knee: Secondary | ICD-10-CM | POA: Diagnosis not present

## 2023-05-31 LAB — HEMOGLOBIN A1C
Hgb A1c MFr Bld: 6.7 % — ABNORMAL HIGH (ref 4.8–5.6)
Mean Plasma Glucose: 146 mg/dL

## 2023-06-02 NOTE — Care Plan (Signed)
 Ortho Bundle Case Management Note  Patient Details  Name: Madison Collins MRN: [id number] Date of Birth: April 19, 1950  met with patient and granddaughter in the office for H&P. will discharge to home with family to assist. rolling walker and CPM ordered. OPPT set up with [facility]. discharge instructions discussed and questions answered. Patient and MD in agreement with plan. Choice offered                     DME Arranged:  Walker rolling, CPM DME Agency:  [employer]  HH Arranged:    HH Agency:     Additional Comments: Please contact me with any questions of if this plan should need to change.  Shauna Hugh,  RN,BSN,MHA,CCM  [facility]opaedic Specialist  [phone] 06/02/2023, 10:25 AM

## 2023-06-06 ENCOUNTER — Other Ambulatory Visit: Payer: Self-pay

## 2023-06-06 ENCOUNTER — Ambulatory Visit (HOSPITAL_COMMUNITY): Admitting: Anesthesiology

## 2023-06-06 ENCOUNTER — Observation Stay (HOSPITAL_COMMUNITY)
Admission: RE | Admit: 2023-06-06 | Discharge: 2023-06-07 | Disposition: A | Discharge status: Home / Self Care | Source: Ambulatory Visit | Attending: Orthopedic Surgery | Admitting: Orthopedic Surgery

## 2023-06-06 ENCOUNTER — Observation Stay (HOSPITAL_COMMUNITY)

## 2023-06-06 ENCOUNTER — Encounter (HOSPITAL_COMMUNITY): Payer: Self-pay | Admitting: Orthopedic Surgery

## 2023-06-06 ENCOUNTER — Encounter (HOSPITAL_COMMUNITY)
Admission: RE | Disposition: A | Discharge status: Home / Self Care | Payer: Self-pay | Source: Ambulatory Visit | Attending: Orthopedic Surgery

## 2023-06-06 DIAGNOSIS — E119 Type 2 diabetes mellitus without complications: Secondary | ICD-10-CM

## 2023-06-06 DIAGNOSIS — E1165 Type 2 diabetes mellitus with hyperglycemia: Secondary | ICD-10-CM

## 2023-06-06 DIAGNOSIS — M1712 Unilateral primary osteoarthritis, left knee: Principal | ICD-10-CM | POA: Diagnosis present

## 2023-06-06 DIAGNOSIS — I10 Essential (primary) hypertension: Secondary | ICD-10-CM | POA: Diagnosis not present

## 2023-06-06 DIAGNOSIS — G8918 Other acute postprocedural pain: Secondary | ICD-10-CM | POA: Diagnosis not present

## 2023-06-06 DIAGNOSIS — Z471 Aftercare following joint replacement surgery: Secondary | ICD-10-CM | POA: Diagnosis not present

## 2023-06-06 DIAGNOSIS — Z96651 Presence of right artificial knee joint: Secondary | ICD-10-CM | POA: Diagnosis not present

## 2023-06-06 DIAGNOSIS — F418 Other specified anxiety disorders: Secondary | ICD-10-CM

## 2023-06-06 DIAGNOSIS — Z79899 Other long term (current) drug therapy: Secondary | ICD-10-CM | POA: Diagnosis not present

## 2023-06-06 DIAGNOSIS — G8929 Other chronic pain: Secondary | ICD-10-CM

## 2023-06-06 DIAGNOSIS — Z96652 Presence of left artificial knee joint: Secondary | ICD-10-CM | POA: Diagnosis not present

## 2023-06-06 DIAGNOSIS — Z91040 Latex allergy status: Secondary | ICD-10-CM | POA: Diagnosis not present

## 2023-06-06 HISTORY — PX: TOTAL KNEE ARTHROPLASTY: SHX125

## 2023-06-06 LAB — TYPE AND SCREEN
ABO/RH(D): A POS
Antibody Screen: NEGATIVE

## 2023-06-06 LAB — GLUCOSE, CAPILLARY: Glucose-Capillary: 140 mg/dL — ABNORMAL HIGH (ref 70–99)

## 2023-06-06 SURGERY — ARTHROPLASTY, KNEE, TOTAL
Anesthesia: Spinal | Site: Knee | Laterality: Left

## 2023-06-06 MED ORDER — WATER FOR IRRIGATION, STERILE IR SOLN
Status: DC | PRN
  Administered 2023-06-06: 1000 mL

## 2023-06-06 MED ORDER — LOSARTAN POTASSIUM 50 MG PO TABS
100.0000 mg | ORAL_TABLET | Freq: Every day | ORAL | Status: DC
  Administered 2023-06-07: 100 mg via ORAL
  Filled 2023-06-06: qty 2

## 2023-06-06 MED ORDER — FENTANYL CITRATE (PF) 100 MCG/2ML IJ SOLN
INTRAMUSCULAR | Status: DC | PRN
  Administered 2023-06-06 (×2): 50 ug via INTRAVENOUS

## 2023-06-06 MED ORDER — FENTANYL CITRATE (PF) 100 MCG/2ML IJ SOLN
INTRAMUSCULAR | Status: AC
  Filled 2023-06-06: qty 2

## 2023-06-06 MED ORDER — HYDROMORPHONE HCL 1 MG/ML IJ SOLN: 0.2500 mg | INTRAMUSCULAR | Status: DC | PRN

## 2023-06-06 MED ORDER — BUPIVACAINE-EPINEPHRINE 0.25% -1:200000 IJ SOLN
INTRAMUSCULAR | Status: DC | PRN
  Administered 2023-06-06: 30 mL

## 2023-06-06 MED ORDER — SODIUM CHLORIDE 0.9 % IR SOLN
Status: DC | PRN
Start: 2023-06-06 — End: 2023-06-06
  Administered 2023-06-06: 3000 mL

## 2023-06-06 MED ORDER — ASPIRIN 81 MG PO TBEC
81.0000 mg | DELAYED_RELEASE_TABLET | Freq: Two times a day (BID) | ORAL | Status: AC
Start: 2023-06-07 — End: 2023-07-05

## 2023-06-06 MED ORDER — AMLODIPINE BESYLATE 10 MG PO TABS
10.0000 mg | ORAL_TABLET | Freq: Every day | ORAL | Status: DC
  Administered 2023-06-07: 10 mg via ORAL
  Filled 2023-06-06: qty 1

## 2023-06-06 MED ORDER — METHOCARBAMOL 1000 MG/10ML IJ SOLN: 500.0000 mg | Freq: Four times a day (QID) | INTRAMUSCULAR | Status: DC | PRN

## 2023-06-06 MED ORDER — CELECOXIB 100 MG PO CAPS: 100.0000 mg | ORAL_CAPSULE | Freq: Two times a day (BID) | ORAL | 0 refills | Status: AC

## 2023-06-06 MED ORDER — PHENYLEPHRINE HCL (PRESSORS) 10 MG/ML IV SOLN
INTRAVENOUS | Status: AC
  Filled 2023-06-06: qty 1

## 2023-06-06 MED ORDER — BUPIVACAINE LIPOSOME 1.3 % IJ SUSP
INTRAMUSCULAR | Status: AC
  Filled 2023-06-06: qty 20

## 2023-06-06 MED ORDER — SODIUM CHLORIDE 0.9 % IV SOLN: 12.5000 mg | INTRAVENOUS | Status: DC | PRN

## 2023-06-06 MED ORDER — ACETAMINOPHEN 500 MG PO TABS
1000.0000 mg | ORAL_TABLET | Freq: Once | ORAL | Status: AC
  Administered 2023-06-06: 1000 mg via ORAL
  Filled 2023-06-06: qty 2

## 2023-06-06 MED ORDER — ASPIRIN 81 MG PO CHEW
81.0000 mg | CHEWABLE_TABLET | Freq: Two times a day (BID) | ORAL | Status: DC
  Administered 2023-06-06 – 2023-06-07 (×2): 81 mg via ORAL
  Filled 2023-06-06 (×2): qty 1

## 2023-06-06 MED ORDER — LORATADINE 10 MG PO TABS
10.0000 mg | ORAL_TABLET | Freq: Every morning | ORAL | Status: DC
  Administered 2023-06-07: 10 mg via ORAL
  Filled 2023-06-06: qty 1

## 2023-06-06 MED ORDER — SODIUM CHLORIDE (PF) 0.9 % IJ SOLN
INTRAMUSCULAR | Status: DC | PRN
  Administered 2023-06-06: 30 mL via INTRAVENOUS

## 2023-06-06 MED ORDER — LACTATED RINGERS IV SOLN: INTRAVENOUS | Status: DC

## 2023-06-06 MED ORDER — ONDANSETRON HCL 4 MG/2ML IJ SOLN
INTRAMUSCULAR | Status: AC
  Filled 2023-06-06: qty 2

## 2023-06-06 MED ORDER — POLYETHYLENE GLYCOL 3350 17 G PO PACK: 17.0000 g | PACK | Freq: Every day | ORAL | 0 refills | Status: DC

## 2023-06-06 MED ORDER — DIPHENHYDRAMINE HCL 12.5 MG/5ML PO ELIX: 12.5000 mg | ORAL_SOLUTION | ORAL | Status: DC | PRN

## 2023-06-06 MED ORDER — ONDANSETRON HCL 4 MG PO TABS: 4.0000 mg | ORAL_TABLET | Freq: Four times a day (QID) | ORAL | Status: DC | PRN

## 2023-06-06 MED ORDER — PHENYLEPHRINE 80 MCG/ML (10ML) SYRINGE FOR IV PUSH (FOR BLOOD PRESSURE SUPPORT)
PREFILLED_SYRINGE | INTRAVENOUS | Status: AC
  Filled 2023-06-06: qty 10

## 2023-06-06 MED ORDER — PHENYLEPHRINE HCL-NACL 20-0.9 MG/250ML-% IV SOLN
INTRAVENOUS | Status: DC | PRN
  Administered 2023-06-06: 40 ug/min via INTRAVENOUS

## 2023-06-06 MED ORDER — DEXAMETHASONE SODIUM PHOSPHATE 4 MG/ML IJ SOLN: 4.0000 mg | Freq: Once | INTRAMUSCULAR | Status: DC

## 2023-06-06 MED ORDER — SUMATRIPTAN SUCCINATE 50 MG PO TABS: 100.0000 mg | ORAL_TABLET | ORAL | Status: DC | PRN

## 2023-06-06 MED ORDER — METHOCARBAMOL 500 MG PO TABS
500.0000 mg | ORAL_TABLET | Freq: Four times a day (QID) | ORAL | Status: DC | PRN
  Administered 2023-06-06 – 2023-06-07 (×2): 500 mg via ORAL
  Filled 2023-06-06 (×2): qty 1

## 2023-06-06 MED ORDER — HYDROMORPHONE HCL 1 MG/ML IJ SOLN: 0.5000 mg | INTRAMUSCULAR | Status: DC | PRN

## 2023-06-06 MED ORDER — CITALOPRAM HYDROBROMIDE 20 MG PO TABS
40.0000 mg | ORAL_TABLET | Freq: Every morning | ORAL | Status: DC
  Administered 2023-06-07: 40 mg via ORAL
  Filled 2023-06-06: qty 2

## 2023-06-06 MED ORDER — MIDAZOLAM HCL 2 MG/2ML IJ SOLN
INTRAMUSCULAR | Status: AC
  Filled 2023-06-06: qty 2

## 2023-06-06 MED ORDER — TRANEXAMIC ACID-NACL 1000-0.7 MG/100ML-% IV SOLN
1000.0000 mg | INTRAVENOUS | Status: DC
  Filled 2023-06-06: qty 100

## 2023-06-06 MED ORDER — ONDANSETRON HCL 4 MG PO TABS: 4.0000 mg | ORAL_TABLET | Freq: Three times a day (TID) | ORAL | 0 refills | Status: AC | PRN

## 2023-06-06 MED ORDER — LIDOCAINE HCL (CARDIAC) PF 100 MG/5ML IV SOSY
PREFILLED_SYRINGE | INTRAVENOUS | Status: DC | PRN
  Administered 2023-06-06: 100 mg via INTRATRACHEAL

## 2023-06-06 MED ORDER — VANCOMYCIN HCL IN DEXTROSE 1-5 GM/200ML-% IV SOLN
1000.0000 mg | Freq: Once | INTRAVENOUS | Status: AC
  Administered 2023-06-06: 1000 mg via INTRAVENOUS
  Filled 2023-06-06: qty 200

## 2023-06-06 MED ORDER — PROPOFOL 10 MG/ML IV BOLUS
INTRAVENOUS | Status: DC | PRN
  Administered 2023-06-06: 75 ug/kg/min via INTRAVENOUS
  Administered 2023-06-06: 50 mg via INTRAVENOUS

## 2023-06-06 MED ORDER — CHLORHEXIDINE GLUCONATE 0.12 % MT SOLN
15.0000 mL | Freq: Once | OROMUCOSAL | Status: AC
  Administered 2023-06-06: 15 mL via OROMUCOSAL

## 2023-06-06 MED ORDER — OXYCODONE HCL 5 MG PO TABS: 5.0000 mg | ORAL_TABLET | ORAL | 0 refills | Status: AC | PRN

## 2023-06-06 MED ORDER — PANTOPRAZOLE SODIUM 40 MG PO TBEC
40.0000 mg | DELAYED_RELEASE_TABLET | Freq: Every day | ORAL | Status: DC
  Administered 2023-06-06 – 2023-06-07 (×2): 40 mg via ORAL
  Filled 2023-06-06 (×2): qty 1

## 2023-06-06 MED ORDER — DOCUSATE SODIUM 100 MG PO CAPS
100.0000 mg | ORAL_CAPSULE | Freq: Two times a day (BID) | ORAL | Status: DC
  Administered 2023-06-06 – 2023-06-07 (×3): 100 mg via ORAL
  Filled 2023-06-06 (×3): qty 1

## 2023-06-06 MED ORDER — ACETAMINOPHEN 500 MG PO TABS
1000.0000 mg | ORAL_TABLET | Freq: Four times a day (QID) | ORAL | Status: AC
  Administered 2023-06-06 – 2023-06-07 (×4): 1000 mg via ORAL
  Filled 2023-06-06 (×4): qty 2

## 2023-06-06 MED ORDER — POLYETHYLENE GLYCOL 3350 17 G PO PACK: 17.0000 g | PACK | Freq: Every day | ORAL | Status: DC | PRN

## 2023-06-06 MED ORDER — 0.9 % SODIUM CHLORIDE (POUR BTL) OPTIME
TOPICAL | Status: DC | PRN
  Administered 2023-06-06: 1000 mL

## 2023-06-06 MED ORDER — PRAVASTATIN SODIUM 20 MG PO TABS: 40.0000 mg | ORAL_TABLET | Freq: Every day | ORAL | Status: DC

## 2023-06-06 MED ORDER — MIDAZOLAM HCL 2 MG/2ML IJ SOLN
INTRAMUSCULAR | Status: DC | PRN
Start: 2023-06-06 — End: 2023-06-06
  Administered 2023-06-06 (×2): 1 mg via INTRAVENOUS

## 2023-06-06 MED ORDER — ACETAMINOPHEN 500 MG PO TABS: 1000.0000 mg | ORAL_TABLET | Freq: Three times a day (TID) | ORAL | Status: AC | PRN

## 2023-06-06 MED ORDER — SODIUM CHLORIDE (PF) 0.9 % IJ SOLN
INTRAMUSCULAR | Status: AC
  Filled 2023-06-06: qty 30

## 2023-06-06 MED ORDER — CEFAZOLIN SODIUM-DEXTROSE 2-4 GM/100ML-% IV SOLN
2.0000 g | INTRAVENOUS | Status: AC
  Administered 2023-06-06: 2 g via INTRAVENOUS
  Filled 2023-06-06: qty 100

## 2023-06-06 MED ORDER — INSULIN ASPART 100 UNIT/ML IJ SOLN: 0.0000 [IU] | INTRAMUSCULAR | Status: DC | PRN

## 2023-06-06 MED ORDER — DEXAMETHASONE SODIUM PHOSPHATE 10 MG/ML IJ SOLN
INTRAMUSCULAR | Status: DC | PRN
Start: 2023-06-06 — End: 2023-06-06
  Administered 2023-06-06: 8 mg via INTRAVENOUS

## 2023-06-06 MED ORDER — BUPIVACAINE-EPINEPHRINE (PF) 0.25% -1:200000 IJ SOLN
INTRAMUSCULAR | Status: AC
  Filled 2023-06-06: qty 30

## 2023-06-06 MED ORDER — CEFAZOLIN SODIUM-DEXTROSE 2-4 GM/100ML-% IV SOLN
2.0000 g | Freq: Four times a day (QID) | INTRAVENOUS | Status: AC
  Administered 2023-06-06 (×2): 2 g via INTRAVENOUS
  Filled 2023-06-06 (×2): qty 100

## 2023-06-06 MED ORDER — LACTATED RINGERS IV SOLN: INTRAVENOUS | Status: AC

## 2023-06-06 MED ORDER — OXYCODONE HCL 5 MG PO TABS
5.0000 mg | ORAL_TABLET | ORAL | Status: DC | PRN
  Administered 2023-06-06 – 2023-06-07 (×5): 10 mg via ORAL
  Filled 2023-06-06 (×5): qty 2

## 2023-06-06 MED ORDER — ONDANSETRON HCL 4 MG/2ML IJ SOLN
INTRAMUSCULAR | Status: DC | PRN
Start: 2023-06-06 — End: 2023-06-06
  Administered 2023-06-06: 4 mg via INTRAVENOUS

## 2023-06-06 MED ORDER — ORAL CARE MOUTH RINSE: 15.0000 mL | Freq: Once | OROMUCOSAL | Status: AC

## 2023-06-06 MED ORDER — SODIUM CHLORIDE 0.9 % IV SOLN: INTRAVENOUS | Status: DC

## 2023-06-06 MED ORDER — TRANEXAMIC ACID-NACL 1000-0.7 MG/100ML-% IV SOLN
INTRAVENOUS | Status: DC | PRN
Start: 2023-06-06 — End: 2023-06-06
  Administered 2023-06-06: 1000 mg via INTRAVENOUS

## 2023-06-06 MED ORDER — ONDANSETRON HCL 4 MG/2ML IJ SOLN: 4.0000 mg | Freq: Four times a day (QID) | INTRAMUSCULAR | Status: DC | PRN

## 2023-06-06 MED ORDER — ROPIVACAINE HCL 5 MG/ML IJ SOLN
INTRAMUSCULAR | Status: DC | PRN
  Administered 2023-06-06: 20 mL via PERINEURAL

## 2023-06-06 MED ORDER — ISOPROPYL ALCOHOL 70 % SOLN
Status: DC | PRN
  Administered 2023-06-06: 1 via TOPICAL

## 2023-06-06 MED ORDER — LACTATED RINGERS IV SOLN: INTRAVENOUS | Status: DC | PRN

## 2023-06-06 MED ORDER — GABAPENTIN 400 MG PO CAPS
800.0000 mg | ORAL_CAPSULE | Freq: Three times a day (TID) | ORAL | Status: DC
  Administered 2023-06-06 – 2023-06-07 (×3): 800 mg via ORAL
  Filled 2023-06-06 (×3): qty 2

## 2023-06-06 MED ORDER — BUPIVACAINE LIPOSOME 1.3 % IJ SUSP
INTRAMUSCULAR | Status: DC | PRN
  Administered 2023-06-06: 20 mL

## 2023-06-06 MED ORDER — MENTHOL 3 MG MT LOZG: 1.0000 | LOZENGE | OROMUCOSAL | Status: DC | PRN

## 2023-06-06 MED ORDER — BUPIVACAINE IN DEXTROSE 0.75-8.25 % IT SOLN
INTRATHECAL | Status: DC | PRN
  Administered 2023-06-06: 2 mL via INTRATHECAL

## 2023-06-06 MED ORDER — POVIDONE-IODINE 10 % EX SWAB: 2.0000 | Freq: Once | CUTANEOUS | Status: DC

## 2023-06-06 MED ORDER — PHENOL 1.4 % MT LIQD: 1.0000 | OROMUCOSAL | Status: DC | PRN

## 2023-06-06 MED ORDER — METHOCARBAMOL 500 MG PO TABS: 500.0000 mg | ORAL_TABLET | Freq: Three times a day (TID) | ORAL | 0 refills | Status: AC | PRN

## 2023-06-06 MED ORDER — CHLORTHALIDONE 25 MG PO TABS
25.0000 mg | ORAL_TABLET | Freq: Every day | ORAL | Status: DC
  Administered 2023-06-07: 25 mg via ORAL
  Filled 2023-06-06: qty 1

## 2023-06-06 MED ORDER — ACETAMINOPHEN 325 MG PO TABS: 325.0000 mg | ORAL_TABLET | Freq: Four times a day (QID) | ORAL | Status: DC | PRN

## 2023-06-06 MED ORDER — DEXAMETHASONE SODIUM PHOSPHATE 10 MG/ML IJ SOLN
INTRAMUSCULAR | Status: AC
  Filled 2023-06-06: qty 1

## 2023-06-06 MED ORDER — BUPIVACAINE LIPOSOME 1.3 % IJ SUSP: 20.0000 mL | Freq: Once | INTRAMUSCULAR | Status: DC

## 2023-06-06 MED ORDER — KETOROLAC TROMETHAMINE 15 MG/ML IJ SOLN
7.5000 mg | Freq: Four times a day (QID) | INTRAMUSCULAR | Status: AC
  Administered 2023-06-06 – 2023-06-07 (×4): 7.5 mg via INTRAVENOUS
  Filled 2023-06-06 (×4): qty 1

## 2023-06-06 SURGICAL SUPPLY — 58 items
BAG COUNTER SPONGE SURGICOUNT (BAG) IMPLANT
BLADE SAG 18X100X1.27 (BLADE) ×1 IMPLANT
BLADE SAW SAG 35X64 .89 (BLADE) ×1 IMPLANT
BLADE SAW SGTL 11.0X1.19X90.0M (BLADE) IMPLANT
BNDG COHESIVE 3X5 TAN ST LF (GAUZE/BANDAGES/DRESSINGS) ×1 IMPLANT
BNDG ELASTIC 6X10 VLCR STRL LF (GAUZE/BANDAGES/DRESSINGS) ×1 IMPLANT
BNDG ELASTIC 6X15 VLCR STRL LF (GAUZE/BANDAGES/DRESSINGS) IMPLANT
BOWL SMART MIX CTS (DISPOSABLE) ×1 IMPLANT
CEMENT BONE R 1X40 (Cement) IMPLANT
CEMENT BONE REFOBACIN R1X40 US (Cement) IMPLANT
CHLORAPREP W/TINT 26 (MISCELLANEOUS) ×2 IMPLANT
COVER SURGICAL LIGHT HANDLE (MISCELLANEOUS) ×1 IMPLANT
CUFF TRNQT CYL 34X4.125X (TOURNIQUET CUFF) ×1 IMPLANT
DERMABOND ADVANCED .7 DNX12 (GAUZE/BANDAGES/DRESSINGS) ×1 IMPLANT
DERMABOND ADVANCED .7 DNX6 (GAUZE/BANDAGES/DRESSINGS) IMPLANT
DRAPE INCISE IOBAN 85X60 (DRAPES) ×1 IMPLANT
DRAPE SHEET LG 3/4 BI-LAMINATE (DRAPES) ×1 IMPLANT
DRAPE U-SHAPE 47X51 STRL (DRAPES) ×1 IMPLANT
DRSG AQUACEL AG ADV 3.5X10 (GAUZE/BANDAGES/DRESSINGS) ×1 IMPLANT
ELECT REM PT RETURN 15FT ADLT (MISCELLANEOUS) ×1 IMPLANT
FEMUR CMT CR STD SZ 6 LT KNEE (Joint) ×1 IMPLANT
FEMUR CMTD CR STD SZ 6 LT KNEE (Joint) IMPLANT
GAUZE SPONGE 4X4 12PLY STRL (GAUZE/BANDAGES/DRESSINGS) ×1 IMPLANT
GLOVE BIO SURGEON STRL SZ 6.5 (GLOVE) ×2 IMPLANT
GLOVE BIOGEL PI IND STRL 6.5 (GLOVE) ×1 IMPLANT
GLOVE BIOGEL PI IND STRL 8 (GLOVE) ×1 IMPLANT
GLOVE SURG ORTHO 8.0 STRL STRW (GLOVE) ×2 IMPLANT
GOWN STRL REUS W/ TWL XL LVL3 (GOWN DISPOSABLE) ×2 IMPLANT
HOLDER FOLEY CATH W/STRAP (MISCELLANEOUS) ×1 IMPLANT
HOOD PEEL AWAY T7 (MISCELLANEOUS) ×3 IMPLANT
INSERT TIB ASF 14 6-7/CD LT (Insert) IMPLANT
KIT TURNOVER KIT A (KITS) IMPLANT
MANIFOLD NEPTUNE II (INSTRUMENTS) ×1 IMPLANT
MARKER SKIN DUAL TIP RULER LAB (MISCELLANEOUS) ×1 IMPLANT
NS IRRIG 1000ML POUR BTL (IV SOLUTION) ×1 IMPLANT
PACK TOTAL KNEE CUSTOM (KITS) ×1 IMPLANT
PENCIL SMOKE EVACUATOR (MISCELLANEOUS) ×1 IMPLANT
PIN DRILL HDLS TROCAR 75 4PK (PIN) IMPLANT
SCREW HEADED 33MM KNEE (MISCELLANEOUS) IMPLANT
SET HNDPC FAN SPRY TIP SCT (DISPOSABLE) ×1 IMPLANT
SOLUTION IRRIG SURGIPHOR (IV SOLUTION) IMPLANT
SOLUTION PRONTOSAN WOUND 350ML (IRRIGATION / IRRIGATOR) IMPLANT
STEM POLY PAT PLY 29M KNEE (Knees) IMPLANT
STEM TIBIA 5 DEG SZ C L KNEE (Knees) IMPLANT
STRIP CLOSURE SKIN 1/2X4 (GAUZE/BANDAGES/DRESSINGS) ×1 IMPLANT
SUT MNCRL AB 3-0 PS2 18 (SUTURE) ×1 IMPLANT
SUT STRATAFIX 0 PDS 27 VIOLET (SUTURE) ×1 IMPLANT
SUT STRATAFIX 14 PDO 48 VLT (SUTURE) ×1 IMPLANT
SUT STRATAFIX PDO 1 14 VIOLET (SUTURE) ×1 IMPLANT
SUT VIC AB 0 CT1 36 (SUTURE) ×1 IMPLANT
SUT VIC AB 2-0 CT2 27 (SUTURE) ×2 IMPLANT
SUTURE STRATFX 0 PDS 27 VIOLET (SUTURE) ×1 IMPLANT
SYR 50ML LL SCALE MARK (SYRINGE) ×1 IMPLANT
TIBIA STEM 5 DEG SZ C L KNEE (Knees) ×1 IMPLANT
TRAY FOLEY MTR SLVR 14FR STAT (SET/KITS/TRAYS/PACK) IMPLANT
TUBE SUCTION HIGH CAP CLEAR NV (SUCTIONS) ×1 IMPLANT
UNDERPAD 30X36 HEAVY ABSORB (UNDERPADS AND DIAPERS) ×1 IMPLANT
WRAP KNEE MAXI GEL POST OP (GAUZE/BANDAGES/DRESSINGS) ×1 IMPLANT

## 2023-06-06 NOTE — Anesthesia Postprocedure Evaluation (Signed)
 Anesthesia Post Note  Patient: Madison Collins  Procedure(s) Performed: ARTHROPLASTY, KNEE, TOTAL (Left: Knee)     Patient location during evaluation: PACU Anesthesia Type: Spinal Level of consciousness: awake and alert Pain management: pain level controlled Vital Signs Assessment: post-procedure vital signs reviewed and stable Respiratory status: spontaneous breathing, nonlabored ventilation and respiratory function stable Cardiovascular status: blood pressure returned to baseline and stable Postop Assessment: no apparent nausea or vomiting Anesthetic complications: no   No notable events documented.  Last Vitals:  Vitals:   06/06/23 1030 06/06/23 1045  BP: (!) 100/49 (!) 105/44  Pulse: 65 65  Resp: 14 19  Temp:    SpO2: 95% 92%    Last Pain:  Vitals:   06/06/23 1045  TempSrc:   PainSc: 0-No pain                 Earvin Goldberg

## 2023-06-06 NOTE — Anesthesia Preprocedure Evaluation (Signed)
 Anesthesia Evaluation  Patient identified by MRN, date of birth, ID band Patient awake    Reviewed: Allergy & Precautions, H&P , NPO status , Patient's Chart, lab work & pertinent test results  Airway Mallampati: II   Neck ROM: Full    Dental  (+) Missing, Dental Advisory Given   Pulmonary    breath sounds clear to auscultation       Cardiovascular hypertension, Pt. on medications  Rhythm:Regular     Neuro/Psych  Headaches  Anxiety Depression       GI/Hepatic ,GERD  Medicated,,(+) Hepatitis -, C  Endo/Other  diabetes, Type 2    Renal/GU      Musculoskeletal  (+) Arthritis , Osteoarthritis,    Abdominal  (+) + obese Abdomen: soft.   Peds  Hematology   Anesthesia Other Findings   Reproductive/Obstetrics                             Anesthesia Physical Anesthesia Plan  ASA: III  Anesthesia Plan: Spinal   Post-op Pain Management: Regional block*   Induction: Intravenous  PONV Risk Score and Plan: 2 and Ondansetron, Midazolam and Treatment may vary due to age or medical condition  Airway Management Planned: Simple Face Mask  Additional Equipment:   Intra-op Plan:   Post-operative Plan:   Informed Consent: I have reviewed the patients History and Physical, chart, labs and discussed the procedure including the risks, benefits and alternatives for the proposed anesthesia with the patient or authorized representative who has indicated his/her understanding and acceptance.       Plan Discussed with:   Anesthesia Plan Comments:         Anesthesia Quick Evaluation

## 2023-06-06 NOTE — Interval H&P Note (Signed)

## 2023-06-06 NOTE — Op Note (Signed)
 DATE OF SURGERY:  06/06/2023 TIME: 9:11 AM  PATIENT NAME:  Madison Collins   AGE: 73 y.o.    PRE-OPERATIVE DIAGNOSIS: End-stage left knee osteoarthritis  POST-OPERATIVE DIAGNOSIS:  Same  PROCEDURE: Left cemented total Knee Arthroplasty  SURGEON:  Tu Shimmel A Rogenia Werntz, MD   ASSISTANT: Mason Sole, PA-C, present and scrubbed throughout the case, critical for assistance with exposure, retraction, instrumentation, and closure.   OPERATIVE IMPLANTS:  Zimmer persona cemented left size 6 standard CR femur, left size C cemented tibial baseplate, left 14 mm MC poly insert, 29 mm cemented all poly patella Implant Name Type Inv. Item Serial No. Manufacturer Lot No. LRB No. Used Action  CEMENT BONE R 1X40 - [id number] Cement CEMENT BONE R 1X40  ZIMMER [employer]ON(ORTH,TRAU,BIO,SG) [id number] Left 2 Implanted  STEM POLY PAT PLY 50M KNEE - [id number] Knees STEM POLY PAT PLY 50M KNEE  ZIMMER [employer]ON(ORTH,TRAU,BIO,SG) [id number] Left 1 Implanted  TIBIA STEM 5 DEG SZ C L KNEE - [id number] Knees TIBIA STEM 5 DEG SZ C L KNEE  ZIMMER RECON(ORTH,TRAU,BIO,SG) [id number] Left 1 Implanted  FEMUR CMT CR STD SZ 6 LT KNEE - [id number] Joint FEMUR CMT CR STD SZ 6 LT KNEE  ZIMMER [employer]ON(ORTH,TRAU,BIO,SG) [id number] Left 1 Implanted  INSERT TIB ASF 14 6-7/CD LT - [id number] Insert INSERT TIB ASF 14 6-7/CD LT  ZIMMER [employer]ON(ORTH,TRAU,BIO,SG) [id number] Left 1 Implanted      PREOPERATIVE INDICATIONS:  Madison Collins is a 73 y.o. year old female with end stage bone on bone degenerative arthritis of the knee who failed conservative treatment, including injections, antiinflammatories, activity modification, and assistive devices, and had significant impairment of their activities of daily living, and elected for Total Knee Arthroplasty.   The risks, benefits, and alternatives were discussed at length including but not limited to the risks of infection, bleeding, nerve injury, stiffness, blood clots, the need for  revision surgery, cardiopulmonary complications, among others, and they were willing to proceed.   ESTIMATED BLOOD LOSS: 50cc  OPERATIVE DESCRIPTION:   Once adequate anesthesia was induced, preoperative antibiotics, 2 gm of ancef and 1g vanc (given hx of MRSA),1 gm of Tranexamic Acid, and 8 mg of Decadron administered, the patient was positioned supine with a left thigh tourniquet placed.  The left lower extremity was prepped and draped in sterile fashion.  A time-  out was performed identifying the patient, planned procedure, and the appropriate extremity.     The leg was  exsanguinated, tourniquet elevated to 250 mmHg.  A midline incision was  made followed by median parapatellar arthrotomy. Anterior horn of the medial meniscus was released and resected. A medial release was performed, the infrapatellar fat pad was resected with care taken to protect the patellar tendon. The suprapatellar fat was removed to exposed the distal anterior femur. The anterior horn of the lateral meniscus and ACL were released.    Following initial  exposure, I first started with the femur  The femoral  canal was opened with a drill, canal was suctioned to try to prevent fat emboli.  An  intramedullary rod was passed set at 5 degrees valgus, 10mm. The distal femur was resected.  Following this resection, the tibia was  subluxated anteriorly.  Using the extramedullary guide, 10mm of bone was resected off   the proximal lateral tibia.  We confirmed the gap would be  stable medially and laterally with a size 10mm spacer block as well as confirmed that the tibial cut was perpendicular in the coronal plane, checking with an  alignment rod.    Once this was done, the posterior femoral referencing femoral sizer was placed under to the posterior condyles with 3 degrees of external rotational which was parallel to the transepicondylar axis and perpendicular to [employer]. The femur was sized to be a size 6 in the anterior-   posterior dimension. The  anterior, posterior, and  chamfer cuts were made without difficulty nor   notching making certain that I was along the anterior cortex to help  with flexion gap stability. Next a laminar spreader was placed with the knee in flexion and the medial lateral menisci were resected.  5 cc of the Exparel mixture was injected in the medial side of the back of the knee and 3 cc in the lateral side.  1/2 inch curved osteotome was used to resect posterior osteophyte that was then removed with a pituitary rongeur.       At this point, the tibia was sized to be a size C.  The size C tray was  then pinned in position. Trial reduction was now carried with a 6 femur, C tibia, a 10 mm MC insert.  There was relative laxity on the medial side so worked up to the 14mm insert which had good stability. The knee had full extension and was stable to varus valgus stress in extension.  The knee was slightly tight in flexion and the PCL was partially released.   Attention was next directed to the patella.  Precut  measurement was noted to be 18 mm.  I resected down to 12 mm and used a  29mm patellar button to restore patellar height as well as cover the cut surface.     The patella lug holes were drilled and a 29 mm patella poly trial was placed.    The knee was brought to full extension with good flexion stability with the patella tracking through the trochlea without application of pressure.     Next the femoral component was again assessed and determined to be seated and appropriately lateralized.  The femoral lug holes were drilled.  The femoral component was then removed. Tibial component was again assessed and felt to be seated and appropriately rotated with the medial third of the tubercle. The tibia was then drilled, and keel punched.     Final components were  opened and cement was mixed.      Final implants were then  cemented onto cleaned and dried cut surfaces of bone with the knee  brought to extension with a 14 mm MC poly.  The knee was irrigated with sterile Betadine diluted in saline as well as pulse lavage normal saline.  The synovial lining was  then injected a dilute Exparel with 30cc of 0.25% marcaine with epinephrine.         Once the cement had fully cured, excess cement was removed throughout the knee.  I confirmed that I was satisfied with the range of motion and stability, and the final 14mm MC poly insert was chosen.  It was placed into the knee.         The tourniquet had been let down at 60 minutes.  No significant hemostasis was required.  The medial parapatellar arthrotomy was then reapproximated using #1 Stratafix sutures with the knee  in flexion.  The remaining wound was closed with 0 stratafix, 2-0 Vicryl, and running 3-0 Monocryl. The knee was cleaned, dried, dressed sterilely using Dermabond and   Aquacel dressing.  The patient was then  brought to recovery room in stable condition, tolerating the procedure  well. There were no complications.   Post op recs: WB: WBAT Abx: ancef + vanc Imaging: PACU xrays DVT prophylaxis: Aspirin 81mg  BID x4 weeks Follow up: 2 weeks after surgery for a wound check with Dr. Pryor Browning at [facility].  Address: [address], [place], [place] [address]  Office Phone: [phone]  Priscille Brought, MD Orthopaedic Surgery

## 2023-06-06 NOTE — Discharge Instructions (Signed)

## 2023-06-06 NOTE — Transfer of Care (Signed)
 Immediate Anesthesia Transfer of Care Note  Patient: Madison Collins  Procedure(s) Performed: ARTHROPLASTY, KNEE, TOTAL (Left: Knee)  Patient Location: PACU  Anesthesia Type:Spinal  Level of Consciousness: awake, alert , and oriented  Airway & Oxygen Therapy: Patient Spontanous Breathing and Patient connected to nasal cannula oxygen  Post-op Assessment: Report given to RN  Post vital signs: Reviewed and stable  Last Vitals:  Vitals Value Taken Time  BP 110/49 06/06/23 0945  Temp    Pulse 66 06/06/23 0946  Resp 12 06/06/23 0946  SpO2 90 % 06/06/23 0946  Vitals shown include unfiled device data.  Last Pain:  Vitals:   06/06/23 0547  TempSrc: Oral         Complications: No notable events documented.

## 2023-06-06 NOTE — Evaluation (Signed)
 Physical Therapy Evaluation Patient Details Name: Madison Collins MRN: [id number] DOB: 18-Aug-1950 Today's Date: 06/06/2023  History of Present Illness  Pt is 73 yo female s/p L TKA on 06/06/23.  Pt with hx including but not limited to Dm, osteopenia, OA, HTN, cervical spondylosis, migraines, R TKA 2015  Clinical Impression  Pt is s/p TKA resulting in the deficits listed below (see PT Problem List). At baseline, pt independent.  She has support at home and only 1 curb to enter.  She will need a youth height RW at d/c.  Today, pt with good ROM, pain control, and quad activation.  She ambulated 23' with CGA.  Expected to progress well. Pt will benefit from acute skilled PT to increase their independence and safety with mobility to allow discharge.          If plan is discharge home, recommend the following: A little help with walking and/or transfers;A little help with bathing/dressing/bathroom;Assistance with cooking/housework;Help with stairs or ramp for entrance   Can travel by private vehicle        Equipment Recommendations Other (comment) (YOUTH RW)  Recommendations for Other Services       Functional Status Assessment Patient has had a recent decline in their functional status and demonstrates the ability to make significant improvements in function in a reasonable and predictable amount of time.     Precautions / Restrictions Precautions Precautions: Fall;Knee Restrictions Weight Bearing Restrictions Per Provider Order: Yes LLE Weight Bearing Per Provider Order: Weight bearing as tolerated      Mobility  Bed Mobility Overal bed mobility: Needs Assistance Bed Mobility: Supine to Sit     Supine to sit: Supervision          Transfers Overall transfer level: Needs assistance Equipment used: Rolling walker (2 wheels) Transfers: Sit to/from Stand Sit to Stand: Contact guard assist           General transfer comment: cues for hand placement     Ambulation/Gait Ambulation/Gait assistance: Contact guard assist Gait Distance (Feet): 40 Feet Assistive device: Rolling walker (2 wheels) Gait Pattern/deviations: Step-to pattern, Decreased stride length, Decreased weight shift to left Gait velocity: decreased     General Gait Details: cues for sequencing; tolerated well  Stairs            Wheelchair Mobility     Tilt Bed    Modified Rankin (Stroke Patients Only)       Balance Overall balance assessment: Needs assistance Sitting-balance support: No upper extremity supported Sitting balance-Leahy Scale: Good     Standing balance support: Bilateral upper extremity supported, Reliant on assistive device for balance   Standing balance comment: steady with RW                             Pertinent Vitals/Pain Pain Assessment Pain Assessment: 0-10 Pain Score: 3  Pain Location: L knee Pain Descriptors / Indicators: Discomfort Pain Intervention(s): Limited activity within patient's tolerance, Monitored during session, Premedicated before session, Repositioned, Ice applied    Home Living Family/patient expects to be discharged to:: Private residence Living Arrangements: Other (Comment) (grandchildren) Available Help at Discharge: Family;Available 24 hours/day Type of Home: Apartment Home Access: Stairs to enter   [employer] of Steps: only the curb   Home Layout: One level Home Equipment: Cane - single point      Prior Function Prior Level of Function : Independent/Modified Independent;Driving;Working/employed  Extremity/Trunk Assessment   Upper Extremity Assessment Upper Extremity Assessment: Overall WFL for tasks assessed    Lower Extremity Assessment Lower Extremity Assessment: LLE deficits/detail;RLE deficits/detail RLE Deficits / Details: ROM WFL ; MMT 5/5 RLE Sensation: WNL LLE Deficits / Details: Expected post op changes; ROM knee 3 to 90  degrees; MMT: ankle 5/5, knee and hip 3/5 not further tested; able to SLR without ext lag LLE Sensation: WNL    Cervical / Trunk Assessment Cervical / Trunk Assessment: Normal  Communication        Cognition Arousal: Alert Behavior During Therapy: WFL for tasks assessed/performed   PT - Cognitive impairments: No apparent impairments                                 Cueing       General Comments General comments (skin integrity, edema, etc.): Encouraged to do ankle pumps and quad set tonight    Exercises     Assessment/Plan    PT Assessment Patient needs continued PT services  PT Problem List Decreased strength;Pain;Decreased range of motion;Decreased activity tolerance;Decreased balance;Decreased mobility;Decreased knowledge of use of DME       PT Treatment Interventions DME instruction;Therapeutic exercise;Gait training;Balance training;Stair training;Functional mobility training;Therapeutic activities;Patient/family education;Modalities    PT Goals (Current goals can be found in the Care Plan section)  Acute Rehab PT Goals Patient Stated Goal: return home PT Goal Formulation: With patient/family Time For Goal Achievement: 06/20/23 Potential to Achieve Goals: Good    Frequency 7X/week     Co-evaluation               AM-PAC PT "6 Clicks" Mobility  Outcome Measure Help needed turning from your back to your side while in a flat bed without using bedrails?: A Little Help needed moving from lying on your back to sitting on the side of a flat bed without using bedrails?: A Little Help needed moving to and from a bed to a chair (including a wheelchair)?: A Little Help needed standing up from a chair using your arms (e.g., wheelchair or bedside chair)?: A Little Help needed to walk in hospital room?: A Little Help needed climbing 3-5 steps with a railing? : A Little 6 Click Score: 18    End of Session Equipment Utilized During Treatment: Gait  belt Activity Tolerance: Patient tolerated treatment well Patient left: with chair alarm set;in chair;with call bell/phone within reach;with SCD's reapplied Nurse Communication: Mobility status PT Visit Diagnosis: Other abnormalities of gait and mobility (R26.89);Muscle weakness (generalized) (M62.81)    Time: 9604-5409 PT Time Calculation (min) (ACUTE ONLY): 19 min   Charges:   PT Evaluation $PT Eval Low Complexity: 1 Low   PT General Charges $$ ACUTE PT VISIT: 1 Visit         Cyd Dowse, PT Acute Rehab Services [facility] [phone]   Carolynn Citrin 06/06/2023, 1:50 PM

## 2023-06-06 NOTE — Anesthesia Procedure Notes (Signed)
 Spinal  Patient location during procedure: OR Start time: 06/06/2023 7:19 AM End time: 06/06/2023 7:24 AM Reason for block: surgical anesthesia Staffing Performed: anesthesiologist  Anesthesiologist: Earvin Goldberg, MD Performed by: Earvin Goldberg, MD Authorized by: Earvin Goldberg, MD   Preanesthetic Checklist Completed: patient identified, IV checked, site marked, risks and benefits discussed, surgical consent, monitors and equipment checked, pre-op evaluation and timeout performed Spinal Block Patient position: sitting Prep: DuraPrep Patient monitoring: heart rate, cardiac monitor, continuous pulse ox and blood pressure Approach: midline Location: L3-4 Injection technique: single-shot Needle Needle type: Sprotte  Needle gauge: 24 G Needle length: 9 cm Assessment Sensory level: T4 Events: CSF return

## 2023-06-06 NOTE — Plan of Care (Signed)

## 2023-06-06 NOTE — Progress Notes (Signed)
 Orthopedic Tech Progress Note Patient Details:  Madison Collins 1950-12-17 [id number]  Ortho Devices Type of Ortho Device: Bone foam zero knee Ortho Device/Splint Interventions: Ordered, Application, Adjustment   Post Interventions Patient Tolerated: Well  Toi Foster 06/06/2023, 10:10 AM

## 2023-06-06 NOTE — Anesthesia Procedure Notes (Signed)
 Anesthesia Regional Block: Adductor canal block   Pre-Anesthetic Checklist: , timeout performed,  Correct Patient, Correct Site, Correct Laterality,  Correct Procedure, Correct Position, site marked,  Risks and benefits discussed,  Surgical consent,  Pre-op evaluation,  At surgeon's request and post-op pain management  Laterality: Left  Prep: chloraprep       Needles:  Injection technique: Single-shot  Needle Type: Stimiplex     Needle Length: 9cm  Needle Gauge: 21     Additional Needles:   Procedures:,,,, ultrasound used (permanent image in chart),,    Narrative:  Start time: 06/06/2023 6:53 AM End time: 06/06/2023 6:58 AM Injection made incrementally with aspirations every 5 mL.  Performed by: Personally  Anesthesiologist: Earvin Goldberg, MD

## 2023-06-07 ENCOUNTER — Encounter (HOSPITAL_COMMUNITY): Payer: Self-pay | Admitting: Orthopedic Surgery

## 2023-06-07 DIAGNOSIS — M1712 Unilateral primary osteoarthritis, left knee: Secondary | ICD-10-CM | POA: Diagnosis not present

## 2023-06-07 LAB — CBC
HCT: 30.7 % — ABNORMAL LOW (ref 36.0–46.0)
Hemoglobin: 10.1 g/dL — ABNORMAL LOW (ref 12.0–15.0)
MCH: 29.1 pg (ref 26.0–34.0)
MCHC: 32.9 g/dL (ref 30.0–36.0)
MCV: 88.5 fL (ref 80.0–100.0)
Platelets: 256 10*3/uL (ref 150–400)
RBC: 3.47 MIL/uL — ABNORMAL LOW (ref 3.87–5.11)
RDW: 13.2 % (ref 11.5–15.5)
WBC: 11.5 10*3/uL — ABNORMAL HIGH (ref 4.0–10.5)
nRBC: 0 % (ref 0.0–0.2)

## 2023-06-07 LAB — BASIC METABOLIC PANEL WITH GFR
Anion gap: 11 (ref 5–15)
BUN: 31 mg/dL — ABNORMAL HIGH (ref 8–23)
CO2: 20 mmol/L — ABNORMAL LOW (ref 22–32)
Calcium: 8.5 mg/dL — ABNORMAL LOW (ref 8.9–10.3)
Chloride: 101 mmol/L (ref 98–111)
Creatinine, Ser: 0.9 mg/dL (ref 0.44–1.00)
GFR, Estimated: >60 mL/min (ref >60)
Glucose, Bld: 134 mg/dL — ABNORMAL HIGH (ref 70–99)
Potassium: 3.6 mmol/L (ref 3.5–5.1)
Sodium: 132 mmol/L — ABNORMAL LOW (ref 135–145)

## 2023-06-07 NOTE — Plan of Care (Signed)
  Problem: Activity: Goal: Risk for activity intolerance will decrease Outcome: Progressing   Problem: Elimination: Goal: Will not experience complications related to urinary retention Outcome: Progressing   Problem: Safety: Goal: Ability to remain free from injury will improve Outcome: Progressing   

## 2023-06-07 NOTE — Progress Notes (Signed)
     Subjective:  Patient reports pain as mild.  Did well with PT yesterday. Moving the leg well. Mobilized 40 feet. Plan for continued PT today and possible discharge home. Patinet denies n/t. No new issues.  Objective:   VITALS:   Vitals:   06/06/23 1743 06/06/23 2219 06/07/23 0111 06/07/23 0610  BP: (!) 130/59 (!) 123/53 (!) 109/48 (!) 126/58  Pulse: 73 66 63 66  Resp: 18 17 16 16   Temp: 98.3 F (36.8 C) 98.9 F (37.2 C) 98.4 F (36.9 C) 98.1 F (36.7 C)  TempSrc: Oral Oral Oral   SpO2: 97% 94% 95% 92%  Weight:      Height:        Sensation intact distally Intact pulses distally Dorsiflexion/Plantar flexion intact Incision: dressing C/D/I Compartment soft    Lab Results  Component Value Date   WBC 11.5 (H) 06/07/2023   HGB 10.1 (L) 06/07/2023   HCT 30.7 (L) 06/07/2023   MCV 88.5 06/07/2023   PLT 256 06/07/2023   BMET    Component Value Date/Time   NA 132 (L) 06/07/2023 0326   NA 140 12/17/2022 1108   K 3.6 06/07/2023 0326   CL 101 06/07/2023 0326   CO2 20 (L) 06/07/2023 0326   GLUCOSE 134 (H) 06/07/2023 0326   BUN 31 (H) 06/07/2023 0326   BUN 22 12/17/2022 1108   CREATININE 0.90 06/07/2023 0326   CREATININE 0.61 01/06/2016 1450   CALCIUM 8.5 (L) 06/07/2023 0326   EGFR 84 12/17/2022 1108   GFRNONAA >60 06/07/2023 0326   GFRNONAA >89 09/30/2014 1625    Xray: TKA components in good positin no adverse features  Assessment/Plan: 1 Day Post-Op   Principal Problem:   Primary osteoarthritis of left knee  S/p L TKA 06/06/23  Post op recs: WB: WBAT Abx: ancef + vanc Imaging: PACU xrays DVT prophylaxis: Aspirin 81mg  BID x4 weeks Follow up: 2 weeks after surgery for a wound check with Dr. Pryor Browning at [facility].  Address: [address], [place], [place] [address]  Office Phone: [phone]      Murleen Arms 06/07/2023, 6:40 AM   Priscille Brought, MD  Contact information:   [phone] 7am-5pm epic message  Dr. Pryor Browning, or call office for patient follow up: [phone] After hours and holidays please check [email or web address] for group call information for [facility]

## 2023-06-07 NOTE — Care Management Obs Status (Signed)
 MEDICARE OBSERVATION STATUS NOTIFICATION   Patient Details  Name: Madison Collins MRN: [id number] Date of Birth: [birth year]   Medicare Observation Status Notification Given:  Yes    Bari Leys, RN 06/07/2023, 9:45 AM

## 2023-06-07 NOTE — Discharge Summary (Signed)
 Physician Discharge Summary  Patient ID: Madison Collins MRN: [id number] DOB/AGE: 26-Jan-1951 73 y.o.  Admit date: 06/06/2023 Discharge date: 06/07/2023  Admission Diagnoses:  Primary osteoarthritis of left knee  Discharge Diagnoses:  Principal Problem:   Primary osteoarthritis of left knee   Past Medical History:  Diagnosis Date   Anxiety    Anxiety and depression    Arthritis    Bulging disc 1990's   c4-5   Cataract    right eye   Class 1 obesity due to excess calories without serious comorbidity with body mass index (BMI) of 30.0 to 30.9 in adult 08/05/2016   Depression    Diabetes mellitus without complication (HCC)    no meds diet controlled   Encounter to establish care 07/29/2020   GERD (gastroesophageal reflux disease)    H/O: osteoarthritis    Headache    Hepatitis C    IA viral load low 02/09    BX past mild fibrosis    Was treated  2009   Hypertension    Insomnia, unspecified    Knee pain    Pneumonia    Post-menopause 2010   Staph infection    Below Left knee  had treatment at wound care center    Surgeries: Procedure(s): ARTHROPLASTY, KNEE, TOTAL on 06/06/2023   Consultants (if any):   Discharged Condition: Improved  Hospital Course: Madison Collins is an 73 y.o. female who was admitted 06/06/2023 with a diagnosis of Primary osteoarthritis of left knee and went to the operating room on 06/06/2023 and underwent the above named procedures.    She was given perioperative antibiotics:  Anti-infectives (From admission, onward)    Start     Dose/Rate Route Frequency Ordered Stop   06/06/23 1330  ceFAZolin (ANCEF) IVPB 2g/100 mL premix        2 g 200 mL/hr over 30 Minutes Intravenous Every 6 hours 06/06/23 1145 06/06/23 2046   06/06/23 0600  ceFAZolin (ANCEF) IVPB 2g/100 mL premix        2 g 200 mL/hr over 30 Minutes Intravenous On call to O.R. 06/06/23 0536 06/06/23 0750   06/06/23 0545  vancomycin (VANCOCIN) IVPB 1000 mg/200 mL premix        Note to Pharmacy: Hx of MRSA infection   1,000 mg 200 mL/hr over 60 Minutes Intravenous  Once 06/06/23 0536 06/06/23 0729     .  She was given sequential compression devices, early ambulation, and aspirin for DVT prophylaxis.  She benefited maximally from the hospital stay and there were no complications.    Recent vital signs:  Vitals:   06/07/23 0111 06/07/23 0610  BP: (!) 109/48 (!) 126/58  Pulse: 63 66  Resp: 16 16  Temp: 98.4 F (36.9 C) 98.1 F (36.7 C)  SpO2: 95% 92%    Recent laboratory studies:  Lab Results  Component Value Date   HGB 10.1 (L) 06/07/2023   HGB 12.4 05/30/2023   HGB 12.1 12/17/2022   Lab Results  Component Value Date   WBC 11.5 (H) 06/07/2023   PLT 256 06/07/2023   Lab Results  Component Value Date   INR 0.94 12/12/2013   Lab Results  Component Value Date   NA 132 (L) 06/07/2023   K 3.6 06/07/2023   CL 101 06/07/2023   CO2 20 (L) 06/07/2023   BUN 31 (H) 06/07/2023   CREATININE 0.90 06/07/2023   GLUCOSE 134 (H) 06/07/2023    Discharge Medications:   Allergies as of 06/07/2023  Reactions   Adhesive [tape] Rash   "per pt - surgical tape, also causes itching   Latex Other (See Comments), Rash   "Latex tape used during surgery - breaks my skin out"        Medication List     STOP taking these medications    diclofenac 75 MG EC tablet Commonly known as: VOLTAREN       TAKE these medications    acetaminophen 500 MG tablet Commonly known as: TYLENOL Take 2 tablets (1,000 mg total) by mouth every 8 (eight) hours as needed. What changed:  how much to take when to take this reasons to take this   amLODipine 10 MG tablet Commonly known as: NORVASC TAKE 1 TABLET BY MOUTH DAILY   aspirin EC 81 MG tablet Take 1 tablet (81 mg total) by mouth 2 (two) times daily for 28 days. Swallow whole.   celecoxib 100 MG capsule Commonly known as: CeleBREX Take 1 capsule (100 mg total) by mouth 2 (two) times daily for  14 days.   chlorthalidone 25 MG tablet Commonly known as: HYGROTON TAKE 1 TABLET BY MOUTH DAILY   citalopram 40 MG tablet Commonly known as: CELEXA TAKE 1 TABLET BY MOUTH EVERY MORNING   fluorouracil 5 % cream Commonly known as: EFUDEX Apply 1 Application topically daily.   gabapentin 800 MG tablet Commonly known as: NEURONTIN Take 1 tablet (800 mg total) by mouth 3 (three) times daily.   loratadine 10 MG tablet Commonly known as: CLARITIN Take 10 mg by mouth in the morning.   losartan 100 MG tablet Commonly known as: COZAAR TAKE 1 TABLET BY MOUTH DAILY   lovastatin 40 MG tablet Commonly known as: MEVACOR Take 1 tablet (40 mg total) by mouth at bedtime.   methocarbamol 500 MG tablet Commonly known as: ROBAXIN Take 1 tablet (500 mg total) by mouth every 8 (eight) hours as needed for up to 10 days for muscle spasms.   omeprazole 20 MG capsule Commonly known as: PRILOSEC TAKE 1 CAPSULE BY MOUTH EVERY MORNING   ondansetron 4 MG tablet Commonly known as: Zofran Take 1 tablet (4 mg total) by mouth every 8 (eight) hours as needed for up to 14 days for nausea or vomiting.   oxyCODONE 5 MG immediate release tablet Commonly known as: Roxicodone Take 1 tablet (5 mg total) by mouth every 4 (four) hours as needed for up to 7 days for severe pain (pain score 7-10) or moderate pain (pain score 4-6).   polyethylene glycol 17 g packet Commonly known as: MiraLax Take 17 g by mouth daily.   SUMAtriptan 100 MG tablet Commonly known as: IMITREX Take 1 tablet (100 mg total) by mouth at onset of headache, may repeat one tablet in 2 hours if needed, *Max of 2 tablets in 24 hours*   Vitamin D (Ergocalciferol) 1.25 MG (50000 UNIT) Caps capsule Commonly known as: DRISDOL TAKE ONE CAPSULE BY MOUTH EVERY 7 DAYS What changed: See the new instructions.        Diagnostic Studies: DG Knee Left Port Result Date: 06/06/2023 CLINICAL DATA:  Postoperative state left knee. EXAM: PORTABLE  LEFT KNEE - 1-2 VIEW COMPARISON:  Left knee radiographs 02/24/2021 FINDINGS: Interval total left knee arthroplasty. No perihardware lucency is seen to indicate hardware failure or loosening. Expected postoperative changes including intra-articular and subcutaneous air. Smalljoint effusion. No acute fracture or dislocation. IMPRESSION: Interval total left knee arthroplasty without evidence of hardware failure. Electronically Signed   By: Bertina Broccoli  M.D.   On: 06/06/2023 12:33    Disposition: Discharge disposition: 01-Home or Self Care       Discharge Instructions     Call MD / Call 911   Complete by: As directed    If you experience chest pain or shortness of breath, CALL 911 and be transported to the hospital emergency room.  If you develope a fever above 101 F, pus (white drainage) or increased drainage or redness at the wound, or calf pain, call your surgeon's office.   Constipation Prevention   Complete by: As directed    Drink plenty of fluids.  Prune juice may be helpful.  You may use a stool softener, such as Colace (over the counter) 100 mg twice a day.  Use MiraLax (over the counter) for constipation as needed.   Diet - low sodium heart healthy   Complete by: As directed    Increase activity slowly as tolerated   Complete by: As directed    Post-operative opioid taper instructions:   Complete by: As directed    POST-OPERATIVE OPIOID TAPER INSTRUCTIONS: It is important to wean off of your opioid medication as soon as possible. If you do not need pain medication after your surgery it is ok to stop day one. Opioids include: Codeine, Hydrocodone(Norco, Vicodin), Oxycodone(Percocet, oxycontin) and hydromorphone amongst others.  Long term and even short term use of opiods can cause: Increased pain response Dependence Constipation Depression Respiratory depression And more.  Withdrawal symptoms can include Flu like symptoms Nausea, vomiting And more Techniques to manage  these symptoms Hydrate well Eat regular healthy meals Stay active Use relaxation techniques(deep breathing, meditating, yoga) Do Not substitute Alcohol to help with tapering If you have been on opioids for less than two weeks and do not have pain than it is ok to stop all together.  Plan to wean off of opioids This plan should start within one week post op of your joint replacement. Maintain the same interval or time between taking each dose and first decrease the dose.  Cut the total daily intake of opioids by one tablet each day Next start to increase the time between doses. The last dose that should be eliminated is the evening dose.           Follow-up Information     Murleen Arms, MD. Go on 06/21/2023.   Specialty: Orthopedic Surgery Why: Your appointment is scheduled for 2:30 Contact information: [address] [place] [address] [phone]         [facility] Follow up.   Why: Your outpatient physical therapy is scheduled for 1:45. Please arrive at 1:30 to complete your paperwork Contact information: [facility] [address] [place] [address] [phone]                    Discharge Instructions      INSTRUCTIONS AFTER JOINT REPLACEMENT   Remove items at home which could result in a fall. This includes throw rugs or furniture in walking pathways ICE to the affected joint every three hours while awake for 30 minutes at a time, for at least the first 3-5 days, and then as needed for pain and swelling.  Continue to use ice for pain and swelling. You may notice swelling that will progress down to the foot and ankle.  This is normal after surgery.  Elevate your leg when you are not up walking on it.   Continue  to use the breathing machine you got in the hospital (incentive spirometer) which will help keep your temperature down.  It is common for your temperature to cycle up and  down following surgery, especially at night when you are not up moving around and exerting yourself.  The breathing machine keeps your lungs expanded and your temperature down.  DIET:  As you were doing prior to hospitalization, we recommend a well-balanced diet.  DRESSING / WOUND CARE / SHOWERING:  Keep the surgical dressing until follow up.  The dressing is water proof, so you can shower without any extra covering.  IF THE DRESSING FALLS OFF or the wound gets wet inside, change the dressing with sterile gauze.  Please use good hand washing techniques before changing the dressing.  Do not use any lotions or creams on the incision until instructed by your surgeon.    ACTIVITY  Increase activity slowly as tolerated, but follow the weight bearing instructions below.   No driving for 6 weeks or until further direction given by your physician.  You cannot drive while taking narcotics.  No lifting or carrying greater than 10 lbs. until further directed by your surgeon. Avoid periods of inactivity such as sitting longer than an hour when not asleep. This helps prevent blood clots.  You may return to work once you are authorized by your doctor.   WEIGHT BEARING: Weight bearing as tolerated with assist device (walker, cane, etc) as directed, use it as long as suggested by your surgeon or therapist, typically at least 4-6 weeks.  EXERCISES  Results after joint replacement surgery are often greatly improved when you follow the exercise, range of motion and muscle strengthening exercises prescribed by your doctor. Safety measures are also important to protect the joint from further injury. Any time any of these exercises cause you to have increased pain or swelling, decrease what you are doing until you are comfortable again and then slowly increase them. If you have problems or questions, call your caregiver or physical therapist for advice.   Rehabilitation is important following a joint replacement.  After just a few days of immobilization, the muscles of the leg can become weakened and shrink (atrophy).  These exercises are designed to build up the tone and strength of the thigh and leg muscles and to improve motion. Often times heat used for twenty to thirty minutes before working out will loosen up your tissues and help with improving the range of motion but do not use heat for the first two weeks following surgery (sometimes heat can increase post-operative swelling).   These exercises can be done on a training (exercise) mat, on the floor, on a table or on a bed. Use whatever works the best and is most comfortable for you.    Use music or television while you are exercising so that the exercises are a pleasant break in your day. This will make your life better with the exercises acting as a break in your routine that you can look forward to.   Perform all exercises about fifteen times, three times per day or as directed.  You should exercise both the operative leg and the other leg as well.  Exercises include:   Quad Sets - Tighten up the muscle on the front of the thigh (Quad) and hold for 5-10 seconds.   Straight Leg Raises - With your knee straight (if you were given a brace, keep it on), lift the leg to 60 degrees, hold for 3  seconds, and slowly lower the leg.  Perform this exercise against resistance later as your leg gets stronger.  Leg Slides: Lying on your back, slowly slide your foot toward your buttocks, bending your knee up off the floor (only go as far as is comfortable). Then slowly slide your foot back down until your leg is flat on the floor again.  Angel Wings: Lying on your back spread your legs to the side as far apart as you can without causing discomfort.  Hamstring Strength:  Lying on your back, push your heel against the floor with your leg straight by tightening up the muscles of your buttocks.  Repeat, but this time bend your knee to a comfortable angle, and push your heel  against the floor.  You may put a pillow under the heel to make it more comfortable if necessary.   A rehabilitation program following joint replacement surgery can speed recovery and prevent re-injury in the future due to weakened muscles. Contact your doctor or a physical therapist for more information on knee rehabilitation.   CONSTIPATION:  Constipation is defined medically as fewer than three stools per week and severe constipation as less than one stool per week.  Even if you have a regular bowel pattern at home, your normal regimen is likely to be disrupted due to multiple reasons following surgery.  Combination of anesthesia, postoperative narcotics, change in appetite and fluid intake all can affect your bowels.   YOU MUST use at least one of the following options; they are listed in order of increasing strength to get the job done.  They are all available over the counter, and you may need to use some, POSSIBLY even all of these options:    Drink plenty of fluids (prune juice may be helpful) and high fiber foods Colace 100 mg by mouth twice a day  Senokot for constipation as directed and as needed Dulcolax (bisacodyl), take with full glass of water  Miralax (polyethylene glycol) once or twice a day as needed.  If you have tried all these things and are unable to have a bowel movement in the first 3-4 days after surgery call either your surgeon or your primary doctor.    If you experience loose stools or diarrhea, hold the medications until you stool forms back up.  If your symptoms do not get better within 1 week or if they get worse, check with your doctor.  If you experience "the worst abdominal pain ever" or develop nausea or vomiting, please contact the office immediately for further recommendations for treatment.  ITCHING:  If you experience itching with your medications, try taking only a single pain pill, or even half a pain pill at a time.  You can also use Benadryl over the  counter for itching or also to help with sleep.   TED HOSE STOCKINGS:  Use stockings on both legs until for at least 2 weeks or as directed by physician office. They may be removed at night for sleeping.  MEDICATIONS:  See your medication summary on the "After Visit Summary" that nursing will review with you.  You may have some home medications which will be placed on hold until you complete the course of blood thinner medication.  It is important for you to complete the blood thinner medication as prescribed.  Blood clot prevention (DVT Prophylaxis): After surgery you are at an increased risk for a blood clot. you were prescribed a blood thinner, Aspirin 81mg , to be taken twice daily  for a total of 4 weeks from surgery to help reduce your risk of getting a blood clot.  Signs of a pulmonary embolus (blood clot in the lungs) include sudden short of breath, feeling lightheaded or dizzy, chest pain with a deep breath, rapid pulse rapid breathing.  Signs of a blood clot in your arms or legs include new unexplained swelling and cramping, warm, red or darkened skin around the painful area.  Please call the office or 911 right away if these signs or symptoms develop.  PRECAUTIONS:   If you experience chest pain or shortness of breath - call 911 immediately for transfer to the hospital emergency department.   If you develop a fever greater that 101 F, purulent drainage from wound, increased redness or drainage from wound, foul odor from the wound/dressing, or calf pain - CONTACT YOUR SURGEON.                                                   FOLLOW-UP APPOINTMENTS:  If you do not already have a post-op appointment, please call the office for an appointment to be seen by your surgeon.  Guidelines for how soon to be seen are listed in your "After Visit Summary", but are typically between 2-3 weeks after surgery.  If you have a specialized bandage, you may be told to follow up 1 week after surgery.  OTHER  INSTRUCTIONS:  Knee Replacement:  Do not place pillow under knee, focus on keeping the knee straight while resting.  Place foam block, curve side up under heel at all times except when walking.  DO NOT modify, tear, cut, or change the foam block in any way.  POST-OPERATIVE OPIOID TAPER INSTRUCTIONS: It is important to wean off of your opioid medication as soon as possible. If you do not need pain medication after your surgery it is ok to stop day one. Opioids include: Codeine, Hydrocodone(Norco, Vicodin), Oxycodone(Percocet, oxycontin) and hydromorphone amongst others.  Long term and even short term use of opiods can cause: Increased pain response Dependence Constipation Depression Respiratory depression And more.  Withdrawal symptoms can include Flu like symptoms Nausea, vomiting And more Techniques to manage these symptoms Hydrate well Eat regular healthy meals Stay active Use relaxation techniques(deep breathing, meditating, yoga) Do Not substitute Alcohol to help with tapering If you have been on opioids for less than two weeks and do not have pain than it is ok to stop all together.  Plan to wean off of opioids This plan should start within one week post op of your joint replacement. Maintain the same interval or time between taking each dose and first decrease the dose.  Cut the total daily intake of opioids by one tablet each day Next start to increase the time between doses. The last dose that should be eliminated is the evening dose.   MAKE SURE YOU:  Understand these instructions.  Get help right away if you are not doing well or get worse.    Thank you for letting us be a part of your medical care team.  It is a privilege we respect greatly.  We hope these instructions will help you stay on track for a fast and full recovery!            Signed: Breckan Cafiero A Travus Oren 06/07/2023, 6:42 AM

## 2023-06-07 NOTE — Plan of Care (Signed)
  Problem: Education: Goal: Knowledge of General Education information will improve Description: Including pain rating scale, medication(s)/side effects and non-pharmacologic comfort measures Outcome: Progressing   Problem: Health Behavior/Discharge Planning: Goal: Ability to manage health-related needs will improve Outcome: Progressing   Problem: Clinical Measurements: Goal: Ability to maintain clinical measurements within normal limits will improve Outcome: Progressing Goal: Will remain free from infection Outcome: Progressing Goal: Diagnostic test results will improve Outcome: Progressing   Problem: Activity: Goal: Risk for activity intolerance will decrease Outcome: Progressing   Problem: Elimination: Goal: Will not experience complications related to bowel motility Outcome: Progressing Goal: Will not experience complications related to urinary retention Outcome: Progressing   Problem: Safety: Goal: Ability to remain free from injury will improve Outcome: Progressing   Problem: Skin Integrity: Goal: Risk for impaired skin integrity will decrease Outcome: Progressing   Problem: Education: Goal: Knowledge of the prescribed therapeutic regimen will improve Outcome: Progressing Goal: Individualized Educational Video(s) Outcome: Progressing   Problem: Activity: Goal: Ability to avoid complications of mobility impairment will improve Outcome: Progressing Goal: Range of joint motion will improve Outcome: Progressing   Problem: Clinical Measurements: Goal: Postoperative complications will be avoided or minimized Outcome: Progressing   Problem: Pain Management: Goal: Pain level will decrease with appropriate interventions Outcome: Progressing   Problem: Skin Integrity: Goal: Will show signs of wound healing Outcome: Progressing

## 2023-06-07 NOTE — Plan of Care (Signed)
  Problem: Education: Goal: Knowledge of General Education information will improve Description: Including pain rating scale, medication(s)/side effects and non-pharmacologic comfort measures Outcome: Adequate for Discharge   Problem: Health Behavior/Discharge Planning: Goal: Ability to manage health-related needs will improve Outcome: Adequate for Discharge   Problem: Clinical Measurements: Goal: Ability to maintain clinical measurements within normal limits will improve Outcome: Adequate for Discharge Goal: Will remain free from infection Outcome: Adequate for Discharge Goal: Diagnostic test results will improve Outcome: Adequate for Discharge Goal: Respiratory complications will improve Outcome: Adequate for Discharge   Problem: Activity: Goal: Risk for activity intolerance will decrease 06/07/2023 1137 by Venice Gillis A, LPN Outcome: Adequate for Discharge 06/07/2023 0954 by Alexa Hymen, LPN Outcome: Progressing   Problem: Elimination: Goal: Will not experience complications related to bowel motility Outcome: Adequate for Discharge Goal: Will not experience complications related to urinary retention 06/07/2023 1137 by Alexa Hymen, LPN Outcome: Adequate for Discharge 06/07/2023 0954 by Alexa Hymen, LPN Outcome: Progressing   Problem: Safety: Goal: Ability to remain free from injury will improve 06/07/2023 1137 by Alexa Hymen, LPN Outcome: Adequate for Discharge 06/07/2023 0954 by Alexa Hymen, LPN Outcome: Progressing   Problem: Skin Integrity: Goal: Risk for impaired skin integrity will decrease Outcome: Adequate for Discharge   Problem: Education: Goal: Knowledge of the prescribed therapeutic regimen will improve Outcome: Adequate for Discharge Goal: Individualized Educational Video(s) Outcome: Adequate for Discharge   Problem: Activity: Goal: Ability to avoid complications of mobility impairment will improve Outcome: Adequate for  Discharge Goal: Range of joint motion will improve Outcome: Adequate for Discharge   Problem: Clinical Measurements: Goal: Postoperative complications will be avoided or minimized Outcome: Adequate for Discharge   Problem: Pain Management: Goal: Pain level will decrease with appropriate interventions Outcome: Adequate for Discharge   Problem: Skin Integrity: Goal: Will show signs of wound healing Outcome: Adequate for Discharge

## 2023-06-07 NOTE — Progress Notes (Signed)
 Physical Therapy Treatment Patient Details Name: Madison Collins MRN: [id number] DOB: 1950/09/09 Today's Date: 06/07/2023   History of Present Illness Pt is 73 yo female s/p L TKA on 06/06/23.  Pt with hx including but not limited to Dm, osteopenia, OA, HTN, cervical spondylosis, migraines, R TKA 2015    PT Comments  Pt is POD # 1 and is progressing well.  Pt with excellent ROM, quad activation, motivation and understanding of HEP.  She rates pain at 7/10 with walking but no severe signs of pain.  Pt able to ambulate 100' and performed stairs similar to home set up.  Pt demonstrates safe gait & transfers in order to return home from PT perspective once discharged by MD.  While in hospital, will continue to benefit from PT for skilled therapy to advance mobility and exercises.       If plan is discharge home, recommend the following: A little help with walking and/or transfers;A little help with bathing/dressing/bathroom;Assistance with cooking/housework;Help with stairs or ramp for entrance   Can travel by private vehicle        Equipment Recommendations  Other (comment) (youth RW)    Recommendations for Other Services       Precautions / Restrictions Precautions Precautions: Fall;Knee Restrictions LLE Weight Bearing Per Provider Order: Weight bearing as tolerated     Mobility  Bed Mobility Overal bed mobility: Needs Assistance Bed Mobility: Supine to Sit     Supine to sit: Supervision          Transfers Overall transfer level: Needs assistance Equipment used: Rolling walker (2 wheels) Transfers: Sit to/from Stand Sit to Stand: Supervision           General transfer comment: from bed and toilet    Ambulation/Gait Ambulation/Gait assistance: Supervision Gait Distance (Feet): 100 Feet Assistive device: Rolling walker (2 wheels) Gait Pattern/deviations: Step-through pattern Gait velocity: decreased but functional     General Gait Details: near  normal gait; did educate on step to if needed for pain control   Stairs Stairs: Yes Stairs assistance: Contact guard assist Stair Management: Step to pattern, Forwards, Backwards, With walker   General stair comments: Did curb step (6") forward and backward wtih CGA;  pt tolerated both well   Wheelchair Mobility     Tilt Bed    Modified Rankin (Stroke Patients Only)       Balance Overall balance assessment: Needs assistance Sitting-balance support: No upper extremity supported Sitting balance-Leahy Scale: Good     Standing balance support: Bilateral upper extremity supported, Reliant on assistive device for balance, No upper extremity supported Standing balance-Leahy Scale: Fair Standing balance comment: RW to ambulate but could stand for ADLs wtihout support                            Communication    Cognition Arousal: Alert Behavior During Therapy: WFL for tasks assessed/performed   PT - Cognitive impairments: No apparent impairments                                Cueing    Exercises Total Joint Exercises Ankle Circles/Pumps: AROM, Both, 10 reps, Supine Quad Sets: AROM, Both, 10 reps, Supine Heel Slides: AROM, Left, 5 reps, Supine Hip ABduction/ADduction: AROM, Left, 5 reps, Supine Long Arc Quad: AROM, Left, 10 reps, Seated Knee Flexion: AROM, Left, 10 reps, Seated Goniometric ROM: ROM knee 3  to 95 degrees Other Exercises Other Exercises: Tolerating well, educated on exercises to tolerance only, educated on AAROM techniques if needed    General Comments   Educated on safe ice use, no pivots, car transfers, resting with leg straight, and TED hose during day. Also, encouraged walking every 1-2 hours during day. Educated on HEP with focus on mobility the first weeks. Discussed doing exercises within pain control and if pain increasing could decreased ROM, reps, and stop exercises as needed. Encouraged to perform quad sets and ankle pumps  frequently for blood flow and to promote full knee extension.      Pertinent Vitals/Pain Pain Assessment Pain Assessment: 0-10 Pain Score: 7  Pain Location: L knee Pain Descriptors / Indicators: Discomfort Pain Intervention(s): Limited activity within patient's tolerance, Monitored during session, Premedicated before session, Repositioned, Other (comment) (Rated at 7/10 with activity but no signs of severe pain, reports tolerable and eases with rest)    Home Living                          Prior Function            PT Goals (current goals can now be found in the care plan section) Progress towards PT goals: Progressing toward goals    Frequency    7X/week      PT Plan      Co-evaluation              AM-PAC PT "6 Clicks" Mobility   Outcome Measure  Help needed turning from your back to your side while in a flat bed without using bedrails?: None Help needed moving from lying on your back to sitting on the side of a flat bed without using bedrails?: None Help needed moving to and from a bed to a chair (including a wheelchair)?: A Little Help needed standing up from a chair using your arms (e.g., wheelchair or bedside chair)?: A Little Help needed to walk in hospital room?: A Little Help needed climbing 3-5 steps with a railing? : A Little 6 Click Score: 20    End of Session Equipment Utilized During Treatment: Gait belt Activity Tolerance: Patient tolerated treatment well Patient left: with chair alarm set;in chair;with call bell/phone within reach;with SCD's reapplied Nurse Communication: Mobility status PT Visit Diagnosis: Other abnormalities of gait and mobility (R26.89);Muscle weakness (generalized) (M62.81)     Time: 1035-1100 PT Time Calculation (min) (ACUTE ONLY): 25 min  Charges:    $Gait Training: 8-22 mins $Therapeutic Exercise: 8-22 mins PT General Charges $$ ACUTE PT VISIT: 1 Visit                     Cyd Dowse, PT Acute Rehab  Services  Rehab [phone]    Carolynn Citrin 06/07/2023, 11:07 AM

## 2023-06-07 NOTE — TOC Progression Note (Signed)
 Transition of Care [facility] - Progression Note    Patient Details  Name: Madison Collins MRN: [id number] Date of Birth: 11/12/1950  [facility] of [facility] CM/SW Contact  Bari Leys, RN Phone Number: 06/07/2023, 11:55 AM  Clinical Narrative:  Met with pt at bedside to review dc therapy and home equipment needs, pt confirmed OPPT, RW delivered to bedside by [employer]. No TOC needs.        Barriers to Discharge: No Barriers Identified  Expected Discharge Plan and Services         Expected Discharge Date: 06/07/23               DME Arranged: Otho Blitz rolling, CPM DME Agency: [employer]                   Social Determinants of Health (SDOH) Interventions SDOH Screenings   Food Insecurity: Food Insecurity Present (06/06/2023)  Housing: Low Risk  (06/06/2023)  Transportation Needs: No Transportation Needs (06/06/2023)  Utilities: At Risk (06/06/2023)  Alcohol Screen: Low Risk  (07/08/2022)  Depression (PHQ2-9): Low Risk  (07/08/2022)  Financial Resource Strain: Medium Risk (07/08/2022)  Physical Activity: Insufficiently Active (07/08/2022)  Social Connections: Moderately Integrated (06/06/2023)  Stress: Stress Concern Present (07/08/2022)  Tobacco Use: Low Risk  (06/06/2023)    Readmission Risk Interventions     No data to display

## 2023-06-11 ENCOUNTER — Other Ambulatory Visit: Payer: Self-pay | Admitting: Nurse Practitioner

## 2023-06-11 DIAGNOSIS — G894 Chronic pain syndrome: Secondary | ICD-10-CM

## 2023-06-20 DIAGNOSIS — W57XXXA Bitten or stung by nonvenomous insect and other nonvenomous arthropods, initial encounter: Secondary | ICD-10-CM | POA: Diagnosis not present

## 2023-06-20 DIAGNOSIS — S1096XA Insect bite of unspecified part of neck, initial encounter: Secondary | ICD-10-CM | POA: Diagnosis not present

## 2023-06-21 DIAGNOSIS — M1712 Unilateral primary osteoarthritis, left knee: Secondary | ICD-10-CM | POA: Diagnosis not present

## 2023-07-11 ENCOUNTER — Ambulatory Visit: Payer: PPO | Admitting: Nurse Practitioner

## 2023-08-02 DIAGNOSIS — M1712 Unilateral primary osteoarthritis, left knee: Secondary | ICD-10-CM | POA: Diagnosis not present

## 2023-08-07 ENCOUNTER — Other Ambulatory Visit: Payer: Self-pay | Admitting: Nurse Practitioner

## 2023-08-07 DIAGNOSIS — G894 Chronic pain syndrome: Secondary | ICD-10-CM

## 2023-08-11 ENCOUNTER — Other Ambulatory Visit: Payer: Self-pay | Admitting: Nurse Practitioner

## 2023-08-11 ENCOUNTER — Ambulatory Visit: Payer: Self-pay

## 2023-08-11 ENCOUNTER — Ambulatory Visit: Admitting: Nurse Practitioner

## 2023-08-11 DIAGNOSIS — G894 Chronic pain syndrome: Secondary | ICD-10-CM

## 2023-08-11 MED ORDER — GABAPENTIN 800 MG PO TABS: 800.0000 mg | ORAL_TABLET | Freq: Three times a day (TID) | ORAL | 1 refills | Status: DC

## 2023-08-11 NOTE — Telephone Encounter (Signed)
 Last apt 12/17/22.

## 2023-08-11 NOTE — Telephone Encounter (Signed)
 Copied from CRM [id number]. Topic: Clinical - Medication Refill >> Aug 11, 2023  8:50 AM Hamp Levine R wrote: Medication:  gabapentin  (NEURONTIN ) 800 MG tablet   Has the patient contacted their pharmacy? Yes, call dr  This is the patient's preferred pharmacy:  [facility] [id number] - [place], [place] - [address] [place] [address] Phone: [phone] Fax: [phone]  Is this the correct pharmacy for this prescription? Yes If no, delete pharmacy and type the correct one.   Has the prescription been filled recently? No  Is the patient out of the medication? No  Has the patient been seen for an appointment in the last year OR does the patient have an upcoming appointment? Yes  Can we respond through MyChart? Yes  Agent: Please be advised that Rx refills may take up to 3 business days. We ask that you follow-up with your pharmacy.

## 2023-08-11 NOTE — Telephone Encounter (Addendum)
     Copied from CRM [id number]. Topic: Clinical - Medical Advice >> Aug 11, 2023  9:00 AM Madison Collins wrote: Patient states that her Celexa  is not work very well for her pain and would like to go back to Diclofenac . Please advise.   Unable to reach patient after 3 attempts. Will route to off for f/u

## 2023-08-11 NOTE — Telephone Encounter (Signed)
 I believe there is a miscommunication with this message. She has been on Celexa  since 2015 for anxiety and depression.   Recently her diclofenac  was changed to CELEBREX  by orthopedic surgery, which is more likely the transition she would like reversed.  Given the change made by orthopedics and she has had surgery in the last 2 months, I would like her to discuss this change with her orthopedic surgeon/provider to ensure that she is on the most appropriate regimen.   If unable to notify the patient over the phone, ok to send mychart message.

## 2023-08-18 ENCOUNTER — Other Ambulatory Visit: Payer: Self-pay | Admitting: Nurse Practitioner

## 2023-08-18 DIAGNOSIS — M1812 Unilateral primary osteoarthritis of first carpometacarpal joint, left hand: Secondary | ICD-10-CM

## 2023-08-18 DIAGNOSIS — M1712 Unilateral primary osteoarthritis, left knee: Secondary | ICD-10-CM

## 2023-08-18 DIAGNOSIS — G894 Chronic pain syndrome: Secondary | ICD-10-CM

## 2023-08-18 MED ORDER — DICLOFENAC SODIUM 75 MG PO TBEC: 75.0000 mg | DELAYED_RELEASE_TABLET | Freq: Two times a day (BID) | ORAL | 6 refills | Status: AC

## 2023-09-13 ENCOUNTER — Other Ambulatory Visit: Payer: Self-pay | Admitting: Nurse Practitioner

## 2023-09-13 DIAGNOSIS — M1712 Unilateral primary osteoarthritis, left knee: Secondary | ICD-10-CM | POA: Diagnosis not present

## 2023-09-13 DIAGNOSIS — Z1231 Encounter for screening mammogram for malignant neoplasm of breast: Secondary | ICD-10-CM

## 2023-09-15 DIAGNOSIS — Z961 Presence of intraocular lens: Secondary | ICD-10-CM | POA: Diagnosis not present

## 2023-09-15 DIAGNOSIS — Z9841 Cataract extraction status, right eye: Secondary | ICD-10-CM | POA: Diagnosis not present

## 2023-09-15 DIAGNOSIS — H26493 Other secondary cataract, bilateral: Secondary | ICD-10-CM | POA: Diagnosis not present

## 2023-09-15 DIAGNOSIS — Z9842 Cataract extraction status, left eye: Secondary | ICD-10-CM | POA: Diagnosis not present

## 2023-09-27 DIAGNOSIS — M48061 Spinal stenosis, lumbar region without neurogenic claudication: Secondary | ICD-10-CM | POA: Diagnosis not present

## 2023-09-29 DIAGNOSIS — M545 Low back pain, unspecified: Secondary | ICD-10-CM | POA: Diagnosis not present

## 2023-10-04 ENCOUNTER — Ambulatory Visit
Admission: RE | Admit: 2023-10-04 | Discharge: 2023-10-04 | Disposition: A | Discharge status: Home / Self Care | Source: Ambulatory Visit | Attending: Nurse Practitioner | Admitting: Nurse Practitioner

## 2023-10-04 DIAGNOSIS — Z1231 Encounter for screening mammogram for malignant neoplasm of breast: Secondary | ICD-10-CM

## 2023-10-06 DIAGNOSIS — H18413 Arcus senilis, bilateral: Secondary | ICD-10-CM | POA: Diagnosis not present

## 2023-10-06 DIAGNOSIS — H40013 Open angle with borderline findings, low risk, bilateral: Secondary | ICD-10-CM | POA: Diagnosis not present

## 2023-10-06 DIAGNOSIS — H35722 Serous detachment of retinal pigment epithelium, left eye: Secondary | ICD-10-CM | POA: Diagnosis not present

## 2023-10-06 DIAGNOSIS — H26491 Other secondary cataract, right eye: Secondary | ICD-10-CM | POA: Diagnosis not present

## 2023-10-06 DIAGNOSIS — H26493 Other secondary cataract, bilateral: Secondary | ICD-10-CM | POA: Diagnosis not present

## 2023-10-11 ENCOUNTER — Ambulatory Visit: Payer: Self-pay | Admitting: Nurse Practitioner

## 2023-10-11 DIAGNOSIS — M48061 Spinal stenosis, lumbar region without neurogenic claudication: Secondary | ICD-10-CM | POA: Diagnosis not present

## 2023-10-12 DIAGNOSIS — H2511 Age-related nuclear cataract, right eye: Secondary | ICD-10-CM | POA: Diagnosis not present

## 2023-10-18 DIAGNOSIS — M5416 Radiculopathy, lumbar region: Secondary | ICD-10-CM | POA: Diagnosis not present

## 2023-10-23 ENCOUNTER — Other Ambulatory Visit: Payer: Self-pay | Admitting: Nurse Practitioner

## 2023-10-23 DIAGNOSIS — E559 Vitamin D deficiency, unspecified: Secondary | ICD-10-CM

## 2023-10-24 DEATH — deceased

## 2023-11-01 ENCOUNTER — Ambulatory Visit

## 2023-11-01 VITALS — BP 130/64 | HR 77 | Temp 97.9°F | Ht 60.0 in | Wt 170.4 lb

## 2023-11-01 DIAGNOSIS — Z23 Encounter for immunization: Secondary | ICD-10-CM | POA: Diagnosis not present

## 2023-11-01 DIAGNOSIS — Z0000 Encounter for general adult medical examination without abnormal findings: Secondary | ICD-10-CM | POA: Diagnosis not present

## 2023-11-01 NOTE — Progress Notes (Signed)
 Subjective:   Madison Collins is a 73 y.o. who presents for a Medicare Wellness preventive visit.  As a reminder, Annual Wellness Visits don't include a physical exam, and some assessments may be limited, especially if this visit is performed virtually. We may recommend an in-person follow-up visit with your provider if needed.  Visit Complete: In person    Persons Participating in Visit: Patient.  AWV Questionnaire: No: Patient Medicare AWV questionnaire was not completed prior to this visit.  Cardiac Risk Factors include: advanced age (>48men, >14 women);diabetes mellitus;dyslipidemia;hypertension;obesity (BMI >30kg/m2)     Objective:    Today's Vitals   11/01/23 1034 11/01/23 1035 11/01/23 1121  BP: (!) 158/70  130/64  Pulse: 77    Temp: 97.9 F (36.6 C)    TempSrc: Oral    SpO2: 96%    Weight: 170 lb 6.4 oz (77.3 kg)    Height: 5' (1.524 m)    PainSc:  10-Worst pain ever    Body mass index is 33.28 kg/m.     11/01/2023   10:51 AM 06/06/2023    6:37 AM 05/30/2023    9:50 AM 07/08/2022    2:32 PM 02/24/2021   12:08 PM 08/18/2020    3:17 PM 03/17/2020    4:54 PM  Advanced Directives  Does Patient Have a Medical Advance Directive? No No No No No No No  Would patient like information on creating a medical advance directive? No - Patient declined No - Patient declined No - Patient declined No - Patient declined No - Patient declined No - Patient declined No - Patient declined    Current Medications (verified) Outpatient Encounter Medications as of 11/01/2023  Medication Sig   amLODipine  (NORVASC ) 10 MG tablet TAKE 1 TABLET BY MOUTH DAILY   chlorthalidone  (HYGROTON ) 25 MG tablet TAKE 1 TABLET BY MOUTH DAILY   citalopram  (CELEXA ) 40 MG tablet TAKE 1 TABLET BY MOUTH EVERY MORNING   diclofenac  (VOLTAREN ) 75 MG EC tablet Take 1 tablet (75 mg total) by mouth 2 (two) times daily.   gabapentin  (NEURONTIN ) 800 MG tablet Take 1 tablet (800 mg total) by mouth 3 (three) times  daily.   loratadine  (CLARITIN ) 10 MG tablet Take 10 mg by mouth in the morning. (Patient taking differently: Take 10 mg by mouth in the morning. As needed)   losartan  (COZAAR ) 100 MG tablet TAKE 1 TABLET BY MOUTH DAILY   lovastatin  (MEVACOR ) 40 MG tablet Take 1 tablet (40 mg total) by mouth at bedtime.   methocarbamol  (ROBAXIN ) 500 MG tablet Take 500 mg by mouth. (Patient taking differently: Take 500 mg by mouth. As needed)   omeprazole  (PRILOSEC) 20 MG capsule TAKE 1 CAPSULE BY MOUTH EVERY MORNING   SUMAtriptan  (IMITREX ) 100 MG tablet Take 1 tablet (100 mg total) by mouth at onset of headache, may repeat one tablet in 2 hours if needed, *Max of 2 tablets in 24 hours* (Patient taking differently: 100 mg. As needed)   Vitamin D , Ergocalciferol , (DRISDOL ) 1.25 MG (50000 UNIT) CAPS capsule TAKE ONE CAPSULE BY MOUTH EVERY 7 DAYS (Patient taking differently: Take 50,000 Units by mouth every Sunday.)   fluorouracil (EFUDEX) 5 % cream Apply 1 Application topically daily. (Patient not taking: Reported on 11/01/2023)   oxyCODONE  (OXY IR/ROXICODONE ) 5 MG immediate release tablet Take 5 mg by mouth 3 (three) times daily as needed. (Patient not taking: Reported on 11/01/2023)   polyethylene glycol (MIRALAX ) 17 g packet Take 17 g by mouth daily. (Patient not taking:  Reported on 11/01/2023)   No facility-administered encounter medications on file as of 11/01/2023.    Allergies (verified) Adhesive [tape] and Latex   History: Past Medical History:  Diagnosis Date   Anxiety    Anxiety and depression    Arthritis    Bulging disc 1990's   c4-5   Cataract    right eye   Class 1 obesity due to excess calories without serious comorbidity with body mass index (BMI) of 30.0 to 30.9 in adult 08/05/2016   Depression    Diabetes mellitus without complication (HCC)    no meds diet controlled   Encounter to establish care 07/29/2020   GERD (gastroesophageal reflux disease)    H/O: osteoarthritis    Headache     Hepatitis C    IA viral load low 02/09    BX past mild fibrosis    Was treated  2009   Hypertension    Insomnia, unspecified    Knee pain    Pneumonia    Post-menopause 2010   Staph infection    Below Left knee  had treatment at wound care center   Past Surgical History:  Procedure Laterality Date   BREAST BIOPSY Left    BUNIONECTOMY Right 1983   COLONOSCOPY  10/22/2013   EYE SURGERY Right 2010   hole in macula   FRACTURE SURGERY N/A    [employer] 04/14/2020   KNEE SURGERY Right     x4   ORIF ELBOW FRACTURE Right 04/08/2015   Procedure: OPEN REDUCTION INTERNAL FIXATION (ORIF) ELBOW/OLECRANON FRACTURE;  Surgeon: Alm Hummer, MD;  Location: [facility] OR;  Service: Orthopedics;  Laterality: Right;   ORIF RADIAL FRACTURE Right 04/08/2015   Procedure: OPEN REDUCTION INTERNAL FIXATION (ORIF) RADIAL HEAD ;  Surgeon: Alm Hummer, MD;  Location[facility] OR;  Service: Orthopedics;  Laterality: Right;   OVARIAN CYST REMOVAL     TOTAL KNEE ARTHROPLASTY Right 12/24/2013   Procedure: RIGHT TOTAL KNEE ARTHROPLASTY;  Surgeon: Dempsey JINNY Sensor, MD;  Location: [facility] OR;  Service: Orthopedics;  Laterality: Right;   TOTAL KNEE ARTHROPLASTY Left 06/06/2023   Procedure: ARTHROPLASTY, KNEE, TOTAL;  Surgeon: Edna Toribio LABOR, MD;  Location: [facility] ORS;  Service: Orthopedics;  Laterality: Left;   TUBAL LIGATION  1987   Family History  Problem Relation Age of Onset   Cancer Mother        Lung with brain mets   Diabetes Mother    Stroke Father 53   Diabetes Father    Colon cancer Father 65       colorectal/anal cancer   Hypertension Brother    Bipolar disorder Daughter    Bipolar disorder Son    Diabetes Maternal Grandfather    Heart disease Maternal Grandfather    Emphysema Paternal Grandmother    Cancer Paternal Grandfather        stomach   Breast cancer Maternal Aunt    Social History   Socioeconomic History   Marital status: Widowed    Spouse name: Not on file   Number of children: Not on file    Years of education: Not on file   Highest education level: Not on file  Occupational History   Occupation: Corporate investment banker  Tobacco Use   Smoking status: Never   Smokeless tobacco: Never  Vaping Use   Vaping status: Never Used  Substance and Sexual Activity   Alcohol  use: Not Currently    Comment: a glass of wine/rarely   Drug use: Not Currently  Comment: Heroin on 03/17/2020   Sexual activity: Yes    Birth control/protection: Post-menopausal  Other Topics Concern   Not on file  Social History Narrative   Marital status: Widower since 2009 from Hepatitis C; + Significant other.       Children:  2 children; 5 grandchildren; no gg.      Lives: with significant Madison Collins).      Employment: [employer] 3 hours per day Monday-Friday      Tobacco: never      Alcohol : special occasions      Drugs: none     Education:  [employer].       Exercise: no formal exercise; very busy with grandchildren and work.   Social Drivers of Health   Financial Resource Strain: Medium Risk (11/01/2023)   Overall Financial Resource Strain (CARDIA)    Difficulty of Paying Living Expenses: Somewhat hard  Food Insecurity: Food Insecurity Present (11/01/2023)   Hunger Vital Sign    Worried About Running Out of Food in the Last Year: Sometimes true    Ran Out of Food in the Last Year: Sometimes true  Transportation Needs: No Transportation Needs (11/01/2023)   PRAPARE - Administrator, Civil Service (Medical): No    Lack of Transportation (Non-Medical): No  Physical Activity: Inactive (11/01/2023)   Exercise Vital Sign    Days of Exercise per Week: 0 days    Minutes of Exercise per Session: 0 min  Stress: Stress Concern Present (11/01/2023)   [employer] of Occupational Health - Occupational Stress Questionnaire    Feeling of Stress: To some extent  Social Connections: Socially Isolated (11/01/2023)   Social Connection and Isolation Panel    Frequency of Communication with Friends and Family:  More than three times a week    Frequency of Social Gatherings with Friends and Family: More than three times a week    Attends Religious Services: Never    Database administrator or Organizations: No    Attends Banker Meetings: Never    Marital Status: Widowed    Tobacco Counseling Counseling given: Not Answered    Clinical Intake:  Pre-visit preparation completed: Yes  Pain : 0-10 Pain Score: 10-Worst pain ever Pain Type: Chronic pain Pain Location: Back Pain Radiating Towards: down legs Pain Descriptors / Indicators: Shooting Pain Onset: More than a month ago Pain Frequency: Constant     Nutritional Status: BMI > 30  Obese Nutritional Risks: None Diabetes: Yes CBG done?: No Did pt. bring in CBG monitor from home?: No  Lab Results  Component Value Date   HGBA1C 6.7 (H) 05/30/2023   HGBA1C 6.6 (H) 12/17/2022   HGBA1C 6.7 (H) 07/08/2022     How often do you need to have someone help you when you read instructions, pamphlets, or other written materials from your doctor or pharmacy?: 1 - Never  Interpreter Needed?: No  Information entered by :: Madison Collins   Activities of Daily Living     11/01/2023   10:38 AM 06/06/2023   12:20 PM  In your present state of health, do you have any difficulty performing the following activities:  Hearing? 0 0  Vision? 1 0  Difficulty concentrating or making decisions? 0 0  Walking or climbing stairs? 0   Dressing or bathing? 0   Doing errands, shopping? 0 0  Preparing Food and eating ? N   Using the Toilet? N   In the past six months, have you  accidently leaked urine? N   Do you have problems with loss of bowel control? N   Managing your Medications? N   Managing your Finances? N   Housekeeping or managing your Housekeeping? N     Patient Care Team: Early, Camie BRAVO, NP as PCP - General (Nurse Practitioner) Lucious, Norleen LITTIE MOULD, MD (Inactive) (Orthopedic Surgery) Lionell Jon DEL, [facility] (Pharmacist) Cleotilde Sewer, OD (Optometry)  I have updated your Care Teams any recent Medical Services you may have received from other providers in the past year.     Assessment:   This is a routine wellness examination for Madison Collins.  Hearing/Vision screen Hearing Screening - Comments:: Denies hearing issues Vision Screening - Comments:: Regular eye exams, [facility]   Goals Addressed             This Visit's Progress    Patient Stated       11/01/2023, wants to lose weight       Depression Screen     11/01/2023   10:55 AM 07/08/2022    2:30 PM 08/20/2020    9:14 AM 07/29/2020    3:27 PM 04/15/2020   10:16 AM 03/24/2020    2:43 PM 01/29/2020   10:46 AM  PHQ 2/9 Scores  PHQ - 2 Score 0 0 2 1 0 0 0  PHQ- 9 Score   5 5       Fall Risk     11/01/2023   10:52 AM 07/08/2022    2:30 PM 10/21/2021    9:05 AM 08/20/2020    9:14 AM 07/29/2020    3:27 PM  Fall Risk   Falls in the past year? 1 0 1 0 1  Comment trips      Number falls in past yr: 1 0 1  0  Injury with Fall? 0 0 1  1  Risk for fall due to : History of fall(s);Medication side effect No Fall Risks Orthopedic patient History of fall(s) No Fall Risks  Follow up Falls prevention discussed;Falls evaluation completed Falls evaluation completed Falls evaluation completed;Education provided   Falls evaluation completed      Data saved with a previous flowsheet row definition    MEDICARE RISK AT HOME:  Medicare Risk at Home Any stairs in or around the home?: No If so, are there any without handrails?: No Home free of loose throw rugs in walkways, pet beds, electrical cords, etc?: Yes Adequate lighting in your home to reduce risk of falls?: Yes Life alert?: No Use of a cane, walker or w/c?: No Grab bars in the bathroom?: No Shower chair or bench in shower?: No Elevated toilet seat or a handicapped toilet?: No  TIMED UP AND GO:  Was the test performed?  Yes  Length of time to ambulate 10 feet: 5 sec Gait steady and fast without use of  assistive device  Cognitive Function: 6CIT completed    07/22/2022    6:12 PM  MMSE - Mini Mental State Exam  Orientation to time 5  Orientation to Place 5  Registration 3  Attention/ Calculation 5  Recall 3  Language- name 2 objects 2  Language- repeat 1  Language- follow 3 step command 3  Language- read & follow direction 1  Write a sentence 1  Copy design 1  Total score 30        11/01/2023   10:56 AM 03/08/2019   11:05 AM  6CIT Screen  What Year? 0 points 0 points  What  month? 0 points 0 points  What time? 0 points 0 points  Count back from 20 0 points 0 points  Months in reverse 0 points 0 points  Repeat phrase 4 points 0 points  Total Score 4 points 0 points    Immunizations Immunization History  Administered Date(s) Administered   Fluad Quad(high Dose 65+) 01/29/2020, 01/28/2021   Fluad Trivalent(High Dose 65+) 12/17/2022   Hepatitis B, ADULT 06/04/2013, 07/03/2013   INFLUENZA, HIGH DOSE SEASONAL PF 01/06/2018, 12/13/2018, 11/01/2023   Influenza Split 11/25/2010, 11/17/2011, 04/02/2015   Influenza,inj,Quad PF,6+ Mos 11/08/2012, 04/02/2015, 04/21/2016, 11/02/2016   Influenza-Unspecified 11/05/2013   PFIZER Comirnaty(Gray Top)Covid-19 Tri-Sucrose Vaccine 04/13/2021   PFIZER(Purple Top)SARS-COV-2 Vaccination 06/27/2019, 08/24/2019   Pfizer(Comirnaty)Fall Seasonal Vaccine 12 years and older 07/08/2022   Pneumococcal Conjugate-13 10/24/2006, 01/16/2019   Pneumococcal Polysaccharide-23 12/26/2013, 01/29/2020   Td 02/23/2007   Tdap 02/23/2007, 03/02/2017   Zoster Recombinant(Shingrix) 06/22/2016   Zoster, Live 08/22/2013, 09/22/2013    Screening Tests Health Maintenance  Topic Date Due   OPHTHALMOLOGY EXAM  Never done   Zoster Vaccines- Shingrix (2 of 2) 08/17/2016   FOOT EXAM  07/08/2023   COVID-19 Vaccine (5 - 2025-26 season) 10/24/2023   Colonoscopy  11/24/2023 (Originally 10/23/2018)   HEMOGLOBIN A1C  11/29/2023   Diabetic kidney evaluation - Urine ACR   12/17/2023   Diabetic kidney evaluation - eGFR measurement  06/06/2024   Medicare Annual Wellness (AWV)  10/31/2024   MAMMOGRAM  10/03/2025   DTaP/Tdap/Td (4 - Td or Tdap) 03/03/2027   Pneumococcal Vaccine: 50+ Years  Completed   Influenza Vaccine  Completed   DEXA SCAN  Completed   Hepatitis C Screening  Completed   HPV VACCINES  Aged Out   Meningococcal B Vaccine  Aged Out   Hepatitis B Vaccines 19-59 Average Risk  Discontinued    Health Maintenance Items Addressed: Influenza vaccine given, Due for second shingles vaccine. Requesting eye exam.  Additional Screening:  Vision Screening: Recommended annual ophthalmology exams for early detection of glaucoma and other disorders of the eye. Is the patient up to date with their annual eye exam?  Yes  Who is the provider or what is the name of the office in which the patient attends annual eye exams? [facility]  Dental Screening: Recommended annual dental exams for proper oral hygiene  Community Resource Referral / Chronic Care Management: CRR required this visit?  No   CCM required this visit?  No   Plan:    I have personally reviewed and noted the following in the patient's chart:   Medical and social history Use of alcohol , tobacco or illicit drugs  Current medications and supplements including opioid prescriptions. Patient is not currently taking opioid prescriptions. Functional ability and status Nutritional status Physical activity Advanced directives List of other physicians Hospitalizations, surgeries, and ER visits in previous 12 months Vitals Screenings to include cognitive, depression, and falls Referrals and appointments  In addition, I have reviewed and discussed with patient certain preventive protocols, quality metrics, and best practice recommendations. A written personalized care plan for preventive services as well as general preventive health recommendations were provided to patient.   Madison FORBES Dawn, Collins   [id number]   After Visit Summary: (In Person-Printed) AVS printed and given to the patient  Notes: Nothing significant to report at this time.

## 2023-11-01 NOTE — Patient Instructions (Signed)
 Madison Collins,  Thank you for taking the time for your Medicare Wellness Visit. I appreciate your continued commitment to your health goals. Please review the care plan we discussed, and feel free to reach out if I can assist you further.  Medicare recommends these wellness visits once per year to help you and your care team stay ahead of potential health issues. These visits are designed to focus on prevention, allowing your provider to concentrate on managing your acute and chronic conditions during your regular appointments.  Please note that Annual Wellness Visits do not include a physical exam. Some assessments may be limited, especially if the visit was conducted virtually. If needed, we may recommend a separate in-person follow-up with your provider.  Ongoing Care Seeing your primary care provider every 3 to 6 months helps us  monitor your health and provide consistent, personalized care.   Referrals If a referral was made during today's visit and you haven't received any updates within two weeks, please contact the referred provider directly to check on the status.  Recommended Screenings:  Health Maintenance  Topic Date Due   Eye exam for diabetics  Never done   Zoster (Shingles) Vaccine (2 of 2) 08/17/2016   Complete foot exam   07/08/2023   Flu Shot  09/23/2023   COVID-19 Vaccine (5 - 2025-26 season) 10/24/2023   Colon Cancer Screening  11/24/2023*   Hemoglobin A1C  11/29/2023   Yearly kidney health urinalysis for diabetes  12/17/2023   Yearly kidney function blood test for diabetes  06/06/2024   Medicare Annual Wellness Visit  10/31/2024   Mammogram  10/03/2025   DTaP/Tdap/Td vaccine (4 - Td or Tdap) 03/03/2027   Pneumococcal Vaccine for age over 7  Completed   DEXA scan (bone density measurement)  Completed   Hepatitis C Screening  Completed   HPV Vaccine  Aged Out   Meningitis B Vaccine  Aged Out   Hepatitis B Vaccine  Discontinued  *Topic was postponed. The date  shown is not the original due date.       11/01/2023   10:51 AM  Advanced Directives  Does Patient Have a Medical Advance Directive? No  Would patient like information on creating a medical advance directive? No - Patient declined   Advance Care Planning is important because it: Ensures you receive medical care that aligns with your values, goals, and preferences. Provides guidance to your family and loved ones, reducing the emotional burden of decision-making during critical moments.  Vision: Annual vision screenings are recommended for early detection of glaucoma, cataracts, and diabetic retinopathy. These exams can also reveal signs of chronic conditions such as diabetes and high blood pressure.  Dental: Annual dental screenings help detect early signs of oral cancer, gum disease, and other conditions linked to overall health, including heart disease and diabetes.  Please see the attached documents for additional preventive care recommendations.

## 2023-11-07 ENCOUNTER — Telehealth: Payer: Self-pay | Admitting: Nurse Practitioner

## 2023-11-07 DIAGNOSIS — G43009 Migraine without aura, not intractable, without status migrainosus: Secondary | ICD-10-CM

## 2023-11-07 DIAGNOSIS — E559 Vitamin D deficiency, unspecified: Secondary | ICD-10-CM

## 2023-11-07 MED ORDER — VITAMIN D (ERGOCALCIFEROL) 1.25 MG (50000 UNIT) PO CAPS: 50000.0000 [IU] | ORAL_CAPSULE | ORAL | 3 refills | Status: AC

## 2023-11-07 MED ORDER — SUMATRIPTAN SUCCINATE 100 MG PO TABS
100.0000 mg | ORAL_TABLET | ORAL | 5 refills | Status: AC
Start: 2023-11-07 — End: ?

## 2023-11-07 NOTE — Telephone Encounter (Signed)
-----   Message from Nurse Ardella A sent at 11/01/2023 11:21 AM EDT ----- Regarding: refill Hello, Ms. Nader needs a refill of imitrex  and vitamin d . Thank you

## 2023-11-08 DIAGNOSIS — M48061 Spinal stenosis, lumbar region without neurogenic claudication: Secondary | ICD-10-CM | POA: Diagnosis not present

## 2023-11-16 ENCOUNTER — Other Ambulatory Visit: Payer: Self-pay | Admitting: Nurse Practitioner

## 2023-11-16 DIAGNOSIS — G894 Chronic pain syndrome: Secondary | ICD-10-CM

## 2023-11-16 DIAGNOSIS — F418 Other specified anxiety disorders: Secondary | ICD-10-CM

## 2023-11-16 NOTE — Telephone Encounter (Signed)
 Last apt 12/17/22 next apt 12/01/23.

## 2023-11-25 ENCOUNTER — Other Ambulatory Visit: Payer: Self-pay | Admitting: Nurse Practitioner

## 2023-11-25 DIAGNOSIS — I152 Hypertension secondary to endocrine disorders: Secondary | ICD-10-CM

## 2023-11-30 DIAGNOSIS — R110 Nausea: Secondary | ICD-10-CM | POA: Diagnosis not present

## 2023-11-30 DIAGNOSIS — G4489 Other headache syndrome: Secondary | ICD-10-CM | POA: Diagnosis not present

## 2023-11-30 DIAGNOSIS — Z20822 Contact with and (suspected) exposure to covid-19: Secondary | ICD-10-CM | POA: Diagnosis not present

## 2023-12-01 ENCOUNTER — Ambulatory Visit: Admitting: Nurse Practitioner

## 2023-12-02 ENCOUNTER — Other Ambulatory Visit: Payer: Self-pay | Admitting: Nurse Practitioner

## 2023-12-02 DIAGNOSIS — E1159 Type 2 diabetes mellitus with other circulatory complications: Secondary | ICD-10-CM

## 2023-12-02 DIAGNOSIS — K219 Gastro-esophageal reflux disease without esophagitis: Secondary | ICD-10-CM

## 2023-12-06 ENCOUNTER — Ambulatory Visit: Admitting: Nurse Practitioner

## 2023-12-06 ENCOUNTER — Ambulatory Visit: Payer: Self-pay

## 2023-12-06 ENCOUNTER — Encounter: Payer: Self-pay | Admitting: Nurse Practitioner

## 2023-12-06 VITALS — BP 132/82 | HR 82 | Wt 154.8 lb

## 2023-12-06 DIAGNOSIS — R350 Frequency of micturition: Secondary | ICD-10-CM | POA: Diagnosis not present

## 2023-12-06 DIAGNOSIS — R519 Headache, unspecified: Secondary | ICD-10-CM

## 2023-12-06 DIAGNOSIS — J069 Acute upper respiratory infection, unspecified: Secondary | ICD-10-CM

## 2023-12-06 DIAGNOSIS — R110 Nausea: Secondary | ICD-10-CM

## 2023-12-06 DIAGNOSIS — G894 Chronic pain syndrome: Secondary | ICD-10-CM

## 2023-12-06 DIAGNOSIS — R051 Acute cough: Secondary | ICD-10-CM | POA: Diagnosis not present

## 2023-12-06 DIAGNOSIS — E785 Hyperlipidemia, unspecified: Secondary | ICD-10-CM | POA: Diagnosis not present

## 2023-12-06 DIAGNOSIS — E1169 Type 2 diabetes mellitus with other specified complication: Secondary | ICD-10-CM | POA: Diagnosis not present

## 2023-12-06 DIAGNOSIS — B9689 Other specified bacterial agents as the cause of diseases classified elsewhere: Secondary | ICD-10-CM | POA: Diagnosis not present

## 2023-12-06 LAB — POCT URINALYSIS DIP (CLINITEK)
Bilirubin, UA: NEGATIVE
Glucose, UA: NEGATIVE mg/dL
Ketones, POC UA: NEGATIVE mg/dL
Leukocytes, UA: NEGATIVE
Nitrite, UA: NEGATIVE
POC PROTEIN,UA: NEGATIVE
Spec Grav, UA: 1.01 (ref 1.010–1.025)
Urobilinogen, UA: 0.2 U/dL
pH, UA: 6 (ref 5.0–8.0)

## 2023-12-06 MED ORDER — ONDANSETRON 4 MG PO TBDP: 4.0000 mg | ORAL_TABLET | Freq: Three times a day (TID) | ORAL | 1 refills | Status: AC | PRN

## 2023-12-06 MED ORDER — GABAPENTIN 800 MG PO TABS: 800.0000 mg | ORAL_TABLET | Freq: Three times a day (TID) | ORAL | 1 refills | Status: AC

## 2023-12-06 MED ORDER — AZITHROMYCIN 250 MG PO TABS: ORAL_TABLET | ORAL | 0 refills | Status: AC

## 2023-12-06 NOTE — Telephone Encounter (Signed)
 FYI Only or Action Required?: FYI only for provider.  Patient was last seen in primary care on 12/17/2022 by Early, Camie BRAVO, NP.  Called Nurse Triage reporting Nausea.  Symptoms began since 11/30/2023 and worsening x week.  Interventions attempted: Nothing.  Symptoms are: gradually worsening.  Triage Disposition: See PCP When Office is Open (Within 3 Days)  Patient/caregiver understands and will follow disposition?: Yes      Copied from CRM [id number]. Topic: Clinical - Red Word Triage >> Dec 06, 2023  8:15 AM Amy B wrote: Red Word that prompted transfer to Nurse Triage: nausea with 14-pound weight loss, unable to eat   LOV  Last Wed 156 libs  11/30/2023  Reason for Disposition  [1] MILD-MODERATE headache AND [2] present > 3 days (72 hours) AND [3] no improvement after using Care Advice  Cough has been present for > 3 weeks  Nausea lasts > 1 week  Answer Assessment - Initial Assessment Questions 1. NAUSEA SEVERITY: How bad is the nausea? (e.g., mild, moderate, severe; dehydration, weight loss)     Nausea -moderate to severe, weight loss, 2. ONSET: When did the nausea begin?  Last Monday nausea 3. VOMITING: Any vomiting? If Yes, ask: How many times today?     no 4. RECURRENT SYMPTOM: Have you had nausea before? If Yes, ask: When was the last time? What happened that time?     no 5. CAUSE: What do you think is causing the nausea?     Night sweats , cough, unable eat, weakness, pale in facial color 6. PREGNANCY: Is there any chance you are pregnant? (e.g., unprotected intercourse, missed birth control pill, broken condom)     No  Pt stated her LOV her weight was 170 lbs  11/01/2023 then when she went to urgent care on 11/30/2023 weight was 156 lbs.  Pt stated she is aware every scale is different but this is a huge wt difference  Cough started last Monday - productive at times with greenish mucous  Tested for flu and COVID on 11/30/2023:  negative  Headache started 11/30/2023 7/10  Answer Assessment - Initial Assessment Questions 1. ONSET: When did the cough begin?     11/30/2023 2. SEVERITY: How bad is the cough today?      moderate 3. SPUTUM: Describe the color of your sputum (e.g., none, dry cough; clear, white, yellow, green)     greenish 4. HEMOPTYSIS: Are you coughing up any blood? If Yes, ask: How much? (e.g., flecks, streaks, tablespoons, etc.)     no 5. DIFFICULTY BREATHING: Are you having difficulty breathing? If Yes, ask: How bad is it? (e.g., mild, moderate, severe)      no 6. FEVER: Do you have a fever? If Yes, ask: What is your temperature, how was it measured, and when did it start?     no 7. CARDIAC HISTORY: Do you have any history of heart disease? (e.g., heart attack, congestive heart failure)      na 8. LUNG HISTORY: Do you have any history of lung disease?  (e.g., pulmonary embolus, asthma, emphysema)     na 9. PE RISK FACTORS: Do you have a history of blood clots? (or: recent major surgery, recent prolonged travel, bedridden)     na 10. OTHER SYMPTOMS: Do you have any other symptoms? (e.g., runny nose, wheezing, chest pain)       Runny nose 11. PREGNANCY: Is there any chance you are pregnant? When was your last menstrual period?  na 12. TRAVEL: Have you traveled out of the country in the last month? (e.g., travel history, exposures)       na  Answer Assessment - Initial Assessment Questions 1. LOCATION: Where does it hurt?      Across  2. ONSET: When did the headache start? (e.g., minutes, hours, days)      11/29/2023 3. PATTERN: Does the pain come and go, or has it been constant since it started?     Comes and goes and worse when laying down 4. SEVERITY: How bad is the pain? and What does it keep you from doing?  (e.g., Scale 1-10; mild, moderate, or severe)     moderate 5. RECURRENT SYMPTOM: Have you ever had headaches before? If Yes, ask:  When was the last time? and What happened that time?      no 6. CAUSE: What do you think is causing the headache?     unknown 7. MIGRAINE: Have you been diagnosed with migraine headaches? If Yes, ask: Is this headache similar?      na 8. HEAD INJURY: Has there been any recent injury to your head?      na 9. OTHER SYMPTOMS: Do you have any other symptoms? (e.g., fever, stiff neck, eye pain, sore throat, cold symptoms)     Runny nose 10. PREGNANCY: Is there any chance you are pregnant? When was your last menstrual period?       na  Protocols used: Nausea-A-AH, Cough - Acute Productive-A-AH, Headache-A-AH

## 2023-12-06 NOTE — Progress Notes (Signed)
 Camie FORBES Doing, DNP, AGNP-c [facility] [address] [place], [place] [address] [phone]   ACUTE VISIT on 12/06/2023  Blood pressure 132/82, pulse 82, weight 154 lb 12.8 oz (70.2 kg).  Subjective:  HPI  History of Present Illness Madison Collins is a 73 year old female who presents with acute cold symptoms and severe headache.  Her symptoms began last Monday with a severe headache, followed by nausea on Tuesday. She visited urgent care and tested negative for both flu and COVID. Despite taking Imitrex  for a presumed migraine, her symptoms did not improve.  The headache is described as severe and persistent, occurring daily and located across her forehead. Nausea has led to a significant decrease in appetite and weight loss from 170 to 154 pounds. She has not vomited but has experienced gagging. She reports intermittent chills and temperature changes, feeling cold and then warm.  She has a productive cough with green mucus but denies sore throat, shortness of breath, or lung congestion. Her urine is clear, and she has been hydrating well. No diarrhea, but bowel movements have been minimal due to reduced food intake.  She is currently taking hydrocodone  for pain following knee surgery and Tylenol , which provided some relief after eating a small meal.  Her social history is significant for high stress related to caring for her grandchildren, who have behavioral issues. She has custody of them and is facing challenges with their mental health and behavior, contributing to her stress and possibly her physical symptoms.   ROS negative except for what is listed in HPI. History, Medications, Surgery, SDOH, and Family History reviewed and updated as appropriate.  Objective:  Physical Exam Vitals reviewed.  Constitutional:      General: She is not in acute distress.    Appearance: She is ill-appearing. She is not toxic-appearing or diaphoretic.  HENT:      Head: Normocephalic.     Nose: Nose normal.     Mouth/Throat:     Mouth: Mucous membranes are moist.     Pharynx: Oropharynx is clear.  Eyes:     General: No scleral icterus.    Conjunctiva/sclera: Conjunctivae normal.  Cardiovascular:     Rate and Rhythm: Regular rhythm. Tachycardia present.     Pulses: Normal pulses.     Heart sounds: Normal heart sounds.  Pulmonary:     Effort: Pulmonary effort is normal.     Breath sounds: Wheezing and rhonchi present.  Abdominal:     General: There is no distension.     Palpations: Abdomen is soft. There is no mass.     Tenderness: There is no abdominal tenderness. There is no right CVA tenderness, left CVA tenderness, guarding or rebound.  Musculoskeletal:     Cervical back: No tenderness.     Right lower leg: No edema.     Left lower leg: No edema.  Lymphadenopathy:     Cervical: No cervical adenopathy.  Skin:    General: Skin is warm and dry.     Capillary Refill: Capillary refill takes less than 2 seconds.  Neurological:     Mental Status: She is alert and oriented to person, place, and time.     Sensory: No sensory deficit.     Motor: Weakness present.         Assessment & Plan:   Problem List Items Addressed This Visit     Bacterial URI - Primary   Symptoms began last Monday with a severe headache, followed by  nausea and cough. COVID-19 and flu tests were negative. Current symptoms include nausea, headache, and cough with green mucus. No shortness of breath. Rhonchi in the lungs. Differential diagnosis includes walking pneumonia and possible bacterial infection. Stress may be a contributing factor due to personal circumstances. No urinary symptoms to suggest UTI. Likely dehydration and reduced oral intake contributing to severe headache. No alarm symptoms.  - Prescribe antibiotics to address potential bacterial infection - Prescribe Zofran  for nausea management - Advise rest and hydration - Provide work note for absence due to  illness - Consider chest x-ray if symptoms do not improve in a few days      Chronic pain syndrome   Relevant Medications   HYDROcodone -acetaminophen  (NORCO/VICODIN) 5-325 MG tablet   gabapentin  (NEURONTIN ) 800 MG tablet   Other Visit Diagnoses       Acute cough         Nausea       Relevant Medications   ondansetron  (ZOFRAN -ODT) 4 MG disintegrating tablet     Nonintractable episodic headache, unspecified headache type       Relevant Medications   HYDROcodone -acetaminophen  (NORCO/VICODIN) 5-325 MG tablet   gabapentin  (NEURONTIN ) 800 MG tablet     Frequent urination       Relevant Orders   POCT URINALYSIS DIP (CLINITEK) (Completed)        Camie BRAVO Yu Cragun, DNP, AGNP-c Time: 38 minutes, >50% spent counseling, care coordination, chart review, and documentation.

## 2023-12-07 ENCOUNTER — Ambulatory Visit: Admitting: Family Medicine

## 2023-12-09 ENCOUNTER — Telehealth (HOSPITAL_BASED_OUTPATIENT_CLINIC_OR_DEPARTMENT_OTHER): Payer: Self-pay | Admitting: Nurse Practitioner

## 2023-12-09 NOTE — Telephone Encounter (Signed)
 Copied from CRM #[id number]. Topic: General - Other >> Dec 09, 2023 10:26 AM Madison Collins wrote: Reason for CRM: Antibiotc has made pt feel like a real person again; still feels weak but feels 100% better. Wanted PCP to know medication has worked and will not require a f/u

## 2023-12-15 DIAGNOSIS — J069 Acute upper respiratory infection, unspecified: Secondary | ICD-10-CM | POA: Insufficient documentation

## 2023-12-15 NOTE — Assessment & Plan Note (Addendum)
 Symptoms began last Monday with a severe headache, followed by nausea and cough. COVID-19 and flu tests were negative. Current symptoms include nausea, headache, and cough with green mucus. No shortness of breath. Rhonchi in the lungs. Differential diagnosis includes walking pneumonia and possible bacterial infection. Stress may be a contributing factor due to personal circumstances. No urinary symptoms to suggest UTI. Likely dehydration and reduced oral intake contributing to severe headache. No alarm symptoms.  - Prescribe antibiotics to address potential bacterial infection - Prescribe Zofran  for nausea management - Advise rest and hydration - Provide work note for absence due to illness - Consider chest x-ray if symptoms do not improve in a few days

## 2023-12-15 NOTE — Assessment & Plan Note (Signed)
 Chronic. Currently managed with lovastatin . No concerns.   - Continue current medications.

## 2024-01-12 DIAGNOSIS — M48061 Spinal stenosis, lumbar region without neurogenic claudication: Secondary | ICD-10-CM | POA: Diagnosis not present

## 2024-01-12 DIAGNOSIS — M4310 Spondylolisthesis, site unspecified: Secondary | ICD-10-CM | POA: Diagnosis not present

## 2024-01-25 DIAGNOSIS — S91201A Unspecified open wound of right great toe with damage to nail, initial encounter: Secondary | ICD-10-CM | POA: Diagnosis not present

## 2024-01-25 DIAGNOSIS — Z23 Encounter for immunization: Secondary | ICD-10-CM | POA: Diagnosis not present

## 2024-02-09 NOTE — Progress Notes (Signed)
 Madison Collins                                          MRN: [id number]   02/09/2024   The [facility] Quality Team Specialist reviewed this patient medical record for the purposes of chart review for care gap closure. The following were reviewed: chart review for care gap closure-kidney health evaluation for diabetes:uACR.    [facility] Quality Team

## 2024-03-14 ENCOUNTER — Telehealth: Payer: Self-pay | Admitting: Nurse Practitioner

## 2024-03-14 NOTE — Telephone Encounter (Signed)
 Surgical clearance Fax received from [facility]  Pt last seen 11/2022 for med check, left message for pt to call to schedule appt with Camie for surgical clearance Form placed in brown folder

## 2024-05-07 ENCOUNTER — Inpatient Hospital Stay (HOSPITAL_COMMUNITY): Admit: 2024-05-07

## 2024-05-07 SURGERY — TRANSFORAMINAL LUMBAR INTERBODY FUSION (TLIF) WITH PEDICLE SCREW FIXATION 1 LEVEL
Anesthesia: General | Site: Back

## 2024-11-06 ENCOUNTER — Ambulatory Visit: Payer: Self-pay
